# Patient Record
Sex: Female | Born: 1935 | Race: Black or African American | Hispanic: No | Smoking: Former smoker
Health system: Southern US, Community
[De-identification: ages and names within clinical notes are randomized; demographics above are authoritative.]

## PROBLEM LIST (undated history)

## (undated) DIAGNOSIS — E119 Type 2 diabetes mellitus without complications: Secondary | ICD-10-CM

## (undated) DIAGNOSIS — E1140 Type 2 diabetes mellitus with diabetic neuropathy, unspecified: Secondary | ICD-10-CM

## (undated) DIAGNOSIS — I4891 Unspecified atrial fibrillation: Secondary | ICD-10-CM

## (undated) DIAGNOSIS — E039 Hypothyroidism, unspecified: Secondary | ICD-10-CM

## (undated) DIAGNOSIS — I499 Cardiac arrhythmia, unspecified: Secondary | ICD-10-CM

## (undated) DIAGNOSIS — I10 Essential (primary) hypertension: Secondary | ICD-10-CM

## (undated) DIAGNOSIS — I639 Cerebral infarction, unspecified: Secondary | ICD-10-CM

## (undated) DIAGNOSIS — D649 Anemia, unspecified: Secondary | ICD-10-CM

## (undated) HISTORY — PX: SALPINGECTOMY: SHX328

## (undated) HISTORY — DX: Cerebral infarction, unspecified: I63.9

## (undated) HISTORY — PX: FRACTURE SURGERY: SHX138

## (undated) HISTORY — DX: Unspecified atrial fibrillation: I48.91

---

## 2004-05-30 ENCOUNTER — Ambulatory Visit: Payer: Self-pay | Admitting: Internal Medicine

## 2005-08-29 ENCOUNTER — Ambulatory Visit: Payer: Self-pay | Admitting: Internal Medicine

## 2006-09-01 ENCOUNTER — Ambulatory Visit: Payer: Self-pay | Admitting: Internal Medicine

## 2007-08-27 ENCOUNTER — Ambulatory Visit: Payer: Self-pay | Admitting: Gastroenterology

## 2007-09-03 ENCOUNTER — Ambulatory Visit: Payer: Self-pay | Admitting: Internal Medicine

## 2008-09-27 ENCOUNTER — Ambulatory Visit: Payer: Self-pay | Admitting: Internal Medicine

## 2009-09-28 ENCOUNTER — Ambulatory Visit: Payer: Self-pay | Admitting: Internal Medicine

## 2010-10-01 ENCOUNTER — Ambulatory Visit: Payer: Self-pay | Admitting: Internal Medicine

## 2011-10-22 ENCOUNTER — Ambulatory Visit: Payer: Self-pay | Admitting: Internal Medicine

## 2012-05-11 ENCOUNTER — Ambulatory Visit: Payer: Self-pay | Admitting: Internal Medicine

## 2012-07-03 ENCOUNTER — Ambulatory Visit: Payer: Self-pay | Admitting: Gastroenterology

## 2012-10-23 ENCOUNTER — Ambulatory Visit: Payer: Self-pay | Admitting: Internal Medicine

## 2013-12-07 ENCOUNTER — Ambulatory Visit: Payer: Self-pay | Admitting: Internal Medicine

## 2014-11-29 ENCOUNTER — Other Ambulatory Visit: Payer: Self-pay | Admitting: Internal Medicine

## 2014-11-29 DIAGNOSIS — Z1231 Encounter for screening mammogram for malignant neoplasm of breast: Secondary | ICD-10-CM

## 2014-12-09 ENCOUNTER — Ambulatory Visit
Admission: RE | Admit: 2014-12-09 | Discharge: 2014-12-09 | Disposition: A | Discharge status: Home / Self Care | Payer: Medicare Other | Source: Ambulatory Visit | Attending: Internal Medicine | Admitting: Internal Medicine

## 2014-12-09 ENCOUNTER — Other Ambulatory Visit: Payer: Self-pay | Admitting: Internal Medicine

## 2014-12-09 DIAGNOSIS — Z1231 Encounter for screening mammogram for malignant neoplasm of breast: Secondary | ICD-10-CM | POA: Diagnosis not present

## 2015-12-05 ENCOUNTER — Other Ambulatory Visit: Payer: Self-pay | Admitting: Internal Medicine

## 2015-12-05 DIAGNOSIS — Z1231 Encounter for screening mammogram for malignant neoplasm of breast: Secondary | ICD-10-CM

## 2015-12-21 ENCOUNTER — Ambulatory Visit
Admission: RE | Admit: 2015-12-21 | Discharge: 2015-12-21 | Disposition: A | Discharge status: Home / Self Care | Payer: Medicare HMO | Source: Ambulatory Visit | Attending: Internal Medicine | Admitting: Internal Medicine

## 2015-12-21 ENCOUNTER — Other Ambulatory Visit: Payer: Self-pay | Admitting: Internal Medicine

## 2015-12-21 DIAGNOSIS — Z1231 Encounter for screening mammogram for malignant neoplasm of breast: Secondary | ICD-10-CM

## 2016-06-28 ENCOUNTER — Encounter: Payer: Self-pay | Admitting: *Deleted

## 2016-06-28 ENCOUNTER — Ambulatory Visit: Payer: Medicare HMO | Admitting: Anesthesiology

## 2016-06-28 ENCOUNTER — Ambulatory Visit
Admission: RE | Admit: 2016-06-28 | Discharge: 2016-06-28 | Disposition: A | Discharge status: Home / Self Care | Payer: Medicare HMO | Source: Ambulatory Visit | Attending: Orthopedic Surgery | Admitting: Orthopedic Surgery

## 2016-06-28 ENCOUNTER — Ambulatory Visit: Payer: Medicare HMO

## 2016-06-28 ENCOUNTER — Encounter
Admission: RE | Disposition: A | Discharge status: Home / Self Care | Payer: Self-pay | Source: Ambulatory Visit | Attending: Orthopedic Surgery

## 2016-06-28 DIAGNOSIS — W002XXA Other fall from one level to another due to ice and snow, initial encounter: Secondary | ICD-10-CM | POA: Insufficient documentation

## 2016-06-28 DIAGNOSIS — E785 Hyperlipidemia, unspecified: Secondary | ICD-10-CM | POA: Diagnosis not present

## 2016-06-28 DIAGNOSIS — E1121 Type 2 diabetes mellitus with diabetic nephropathy: Secondary | ICD-10-CM | POA: Insufficient documentation

## 2016-06-28 DIAGNOSIS — D649 Anemia, unspecified: Secondary | ICD-10-CM | POA: Diagnosis not present

## 2016-06-28 DIAGNOSIS — E039 Hypothyroidism, unspecified: Secondary | ICD-10-CM | POA: Diagnosis not present

## 2016-06-28 DIAGNOSIS — S52571A Other intraarticular fracture of lower end of right radius, initial encounter for closed fracture: Secondary | ICD-10-CM | POA: Insufficient documentation

## 2016-06-28 DIAGNOSIS — Z87891 Personal history of nicotine dependence: Secondary | ICD-10-CM | POA: Insufficient documentation

## 2016-06-28 DIAGNOSIS — I10 Essential (primary) hypertension: Secondary | ICD-10-CM | POA: Insufficient documentation

## 2016-06-28 DIAGNOSIS — Z8781 Personal history of (healed) traumatic fracture: Secondary | ICD-10-CM

## 2016-06-28 DIAGNOSIS — Z7982 Long term (current) use of aspirin: Secondary | ICD-10-CM | POA: Diagnosis not present

## 2016-06-28 DIAGNOSIS — Z9889 Other specified postprocedural states: Secondary | ICD-10-CM

## 2016-06-28 HISTORY — DX: Essential (primary) hypertension: I10

## 2016-06-28 HISTORY — DX: Hypothyroidism, unspecified: E03.9

## 2016-06-28 HISTORY — DX: Type 2 diabetes mellitus without complications: E11.9

## 2016-06-28 HISTORY — DX: Anemia, unspecified: D64.9

## 2016-06-28 HISTORY — DX: Type 2 diabetes mellitus with diabetic neuropathy, unspecified: E11.40

## 2016-06-28 HISTORY — PX: ORIF WRIST FRACTURE: SHX2133

## 2016-06-28 LAB — GLUCOSE, CAPILLARY: Glucose-Capillary: 116 mg/dL — ABNORMAL HIGH (ref 65–99)

## 2016-06-28 SURGERY — OPEN REDUCTION INTERNAL FIXATION (ORIF) WRIST FRACTURE
Anesthesia: General | Laterality: Right

## 2016-06-28 MED ORDER — EPHEDRINE SULFATE 50 MG/ML IJ SOLN
INTRAMUSCULAR | Status: DC | PRN
  Administered 2016-06-28: 5 mg via INTRAVENOUS

## 2016-06-28 MED ORDER — HYDROCODONE-ACETAMINOPHEN 5-325 MG PO TABS: 1.0000 | ORAL_TABLET | Freq: Four times a day (QID) | ORAL | 0 refills | Status: DC | PRN

## 2016-06-28 MED ORDER — PROPOFOL 10 MG/ML IV BOLUS
INTRAVENOUS | Status: AC
  Filled 2016-06-28: qty 20

## 2016-06-28 MED ORDER — FENTANYL CITRATE (PF) 100 MCG/2ML IJ SOLN
25.0000 ug | INTRAMUSCULAR | Status: DC | PRN
  Administered 2016-06-28 (×3): 25 ug via INTRAVENOUS

## 2016-06-28 MED ORDER — CEFAZOLIN SODIUM-DEXTROSE 2-4 GM/100ML-% IV SOLN
INTRAVENOUS | Status: AC
  Filled 2016-06-28: qty 100

## 2016-06-28 MED ORDER — LIDOCAINE HCL (CARDIAC) 20 MG/ML IV SOLN
INTRAVENOUS | Status: DC | PRN
  Administered 2016-06-28: 80 mg via INTRAVENOUS

## 2016-06-28 MED ORDER — SODIUM CHLORIDE 0.9 % IV SOLN
INTRAVENOUS | Status: DC
  Administered 2016-06-28: 100 mL/h via INTRAVENOUS

## 2016-06-28 MED ORDER — LACTATED RINGERS IV SOLN
INTRAVENOUS | Status: DC | PRN
  Administered 2016-06-28: 13:00:00 via INTRAVENOUS

## 2016-06-28 MED ORDER — FENTANYL CITRATE (PF) 100 MCG/2ML IJ SOLN
INTRAMUSCULAR | Status: AC
  Filled 2016-06-28: qty 2

## 2016-06-28 MED ORDER — FAMOTIDINE 20 MG PO TABS
ORAL_TABLET | ORAL | Status: AC
  Administered 2016-06-28: 20 mg via ORAL
  Filled 2016-06-28: qty 1

## 2016-06-28 MED ORDER — CEFAZOLIN SODIUM-DEXTROSE 2-3 GM-% IV SOLR
INTRAVENOUS | Status: DC | PRN
  Administered 2016-06-28: 2 g via INTRAVENOUS

## 2016-06-28 MED ORDER — FAMOTIDINE 20 MG PO TABS
20.0000 mg | ORAL_TABLET | Freq: Once | ORAL | Status: AC
  Administered 2016-06-28: 20 mg via ORAL

## 2016-06-28 MED ORDER — GLYCOPYRROLATE 0.2 MG/ML IJ SOLN
INTRAMUSCULAR | Status: DC | PRN
  Administered 2016-06-28: 0.2 mg via INTRAVENOUS

## 2016-06-28 MED ORDER — PROPOFOL 10 MG/ML IV BOLUS
INTRAVENOUS | Status: DC | PRN
  Administered 2016-06-28: 80 mg via INTRAVENOUS
  Administered 2016-06-28: 30 mg via INTRAVENOUS

## 2016-06-28 MED ORDER — NEOMYCIN-POLYMYXIN B GU 40-200000 IR SOLN
Status: AC
  Filled 2016-06-28: qty 2

## 2016-06-28 MED ORDER — DEXAMETHASONE SODIUM PHOSPHATE 10 MG/ML IJ SOLN
INTRAMUSCULAR | Status: DC | PRN
  Administered 2016-06-28: 4 mg via INTRAVENOUS

## 2016-06-28 MED ORDER — FENTANYL CITRATE (PF) 100 MCG/2ML IJ SOLN
INTRAMUSCULAR | Status: DC | PRN
  Administered 2016-06-28 (×4): 25 ug via INTRAVENOUS

## 2016-06-28 MED ORDER — CEFAZOLIN SODIUM-DEXTROSE 2-4 GM/100ML-% IV SOLN: 2.0000 g | Freq: Once | INTRAVENOUS | Status: DC

## 2016-06-28 MED ORDER — ONDANSETRON HCL 4 MG/2ML IJ SOLN: 4.0000 mg | Freq: Once | INTRAMUSCULAR | Status: DC | PRN

## 2016-06-28 SURGICAL SUPPLY — 45 items
BANDAGE ACE 4X5 VEL STRL LF (GAUZE/BANDAGES/DRESSINGS) ×3 IMPLANT
BANDAGE ELASTIC 4 LF NS (GAUZE/BANDAGES/DRESSINGS) ×3 IMPLANT
BIT DRILL 2 FAST STEP (BIT) ×3 IMPLANT
BIT DRILL 2.5X4 QC (BIT) ×3 IMPLANT
BNDG ESMARK 4X12 TAN STRL LF (GAUZE/BANDAGES/DRESSINGS) ×3 IMPLANT
CANISTER SUCT 1200ML W/VALVE (MISCELLANEOUS) ×3 IMPLANT
CHLORAPREP W/TINT 26ML (MISCELLANEOUS) ×3 IMPLANT
CUFF TOURN 18 STER (MISCELLANEOUS) IMPLANT
DRAPE FLUOR MINI C-ARM 54X84 (DRAPES) ×3 IMPLANT
ELECT CAUTERY BLADE 6.4 (BLADE) ×3 IMPLANT
ELECT REM PT RETURN 9FT ADLT (ELECTROSURGICAL) ×3
ELECTRODE REM PT RTRN 9FT ADLT (ELECTROSURGICAL) ×1 IMPLANT
GAUZE PETRO XEROFOAM 1X8 (MISCELLANEOUS) ×3 IMPLANT
GAUZE SPONGE 4X4 12PLY STRL (GAUZE/BANDAGES/DRESSINGS) ×3 IMPLANT
GLOVE BIOGEL PI IND STRL 9 (GLOVE) ×1 IMPLANT
GLOVE BIOGEL PI INDICATOR 9 (GLOVE) ×2
GLOVE SURG SYN 9.0  PF PI (GLOVE) ×2
GLOVE SURG SYN 9.0 PF PI (GLOVE) ×1 IMPLANT
GOWN SRG 2XL LVL 4 RGLN SLV (GOWNS) ×1 IMPLANT
GOWN STRL NON-REIN 2XL LVL4 (GOWNS) ×2
GOWN STRL REUS W/ TWL LRG LVL3 (GOWN DISPOSABLE) ×1 IMPLANT
GOWN STRL REUS W/TWL LRG LVL3 (GOWN DISPOSABLE) ×2
K-WIRE 1.6 (WIRE) ×2
K-WIRE FX5X1.6XNS BN SS (WIRE) ×1
KIT RM TURNOVER STRD PROC AR (KITS) ×3 IMPLANT
KWIRE FX5X1.6XNS BN SS (WIRE) ×1 IMPLANT
NEEDLE FILTER BLUNT 18X 1/2SAF (NEEDLE) ×2
NEEDLE FILTER BLUNT 18X1 1/2 (NEEDLE) ×1 IMPLANT
NS IRRIG 500ML POUR BTL (IV SOLUTION) ×3 IMPLANT
PACK EXTREMITY ARMC (MISCELLANEOUS) ×3 IMPLANT
PAD CAST CTTN 4X4 STRL (SOFTGOODS) ×1 IMPLANT
PADDING CAST COTTON 4X4 STRL (SOFTGOODS) ×2
PEG SUBCHONDRAL SMOOTH 2.0X14 (Peg) ×3 IMPLANT
PEG SUBCHONDRAL SMOOTH 2.0X18 (Peg) ×3 IMPLANT
PEG SUBCHONDRAL SMOOTH 2.0X22 (Peg) ×9 IMPLANT
PEG SUBCHONDRAL SMOOTH 2.0X24 (Peg) ×3 IMPLANT
PLATE SHORT 21.6X48.9 NRRW RT (Plate) ×3 IMPLANT
SCREW CORT 3.5X10 LNG (Screw) ×9 IMPLANT
SPLINT CAST 1 STEP 3X12 (MISCELLANEOUS) ×3 IMPLANT
STOCKINETTE STRL 4IN 9604848 (GAUZE/BANDAGES/DRESSINGS) ×3 IMPLANT
SUT ETHILON 4-0 (SUTURE) ×2
SUT ETHILON 4-0 FS2 18XMFL BLK (SUTURE) ×1
SUT VICRYL 3-0 27IN (SUTURE) ×3 IMPLANT
SUTURE ETHLN 4-0 FS2 18XMF BLK (SUTURE) ×1 IMPLANT
SYR 3ML LL SCALE MARK (SYRINGE) ×3 IMPLANT

## 2016-06-28 NOTE — Anesthesia Preprocedure Evaluation (Addendum)
Anesthesia Evaluation  Patient identified by MRN, date of birth, ID band Patient awake    Reviewed: Allergy & Precautions, NPO status , Patient's Chart, lab work & pertinent test results, reviewed documented beta blocker date and time   Airway Mallampati: II  TM Distance: >3 FB     Dental  (+) Chipped, Partial Lower, Partial Upper   Pulmonary former smoker,           Cardiovascular hypertension, Pt. on medications      Neuro/Psych    GI/Hepatic   Endo/Other  diabetes, Type 2Hypothyroidism   Renal/GU      Musculoskeletal   Abdominal   Peds  Hematology  (+) anemia ,   Anesthesia Other Findings   Reproductive/Obstetrics                            Anesthesia Physical Anesthesia Plan  ASA: III  Anesthesia Plan: General   Post-op Pain Management:    Induction: Intravenous  Airway Management Planned: LMA  Additional Equipment:   Intra-op Plan:   Post-operative Plan:   Informed Consent: I have reviewed the patients History and Physical, chart, labs and discussed the procedure including the risks, benefits and alternatives for the proposed anesthesia with the patient or authorized representative who has indicated his/her understanding and acceptance.     Plan Discussed with: CRNA  Anesthesia Plan Comments:         Anesthesia Quick Evaluation

## 2016-06-28 NOTE — H&P (Signed)
Reviewed paper H+P, will be scanned into chart. No changes noted.  

## 2016-06-28 NOTE — Discharge Instructions (Addendum)
Keep arm elevated. Okay to ice tonight and tomorrow. Work fingers is much as possible. Pain medicine as directed.    AMBULATORY SURGERY  DISCHARGE INSTRUCTIONS   1) The drugs that you were given will stay in your system until tomorrow so for the next 24 hours you should not:  A) Drive an automobile B) Make any legal decisions C) Drink any alcoholic beverage   2) You may resume regular meals tomorrow.  Today it is better to start with liquids and gradually work up to solid foods.  You may eat anything you prefer, but it is better to start with liquids, then soup and crackers, and gradually work up to solid foods.   3) Please notify your doctor immediately if you have any unusual bleeding, trouble breathing, redness and pain at the surgery site, drainage, fever, or pain not relieved by medication.    4) Additional Instructions:        Please contact your physician with any problems or Same Day Surgery at [phone], Monday through Friday 6 am to 4 pm, or [place] at [facility] Main number at [phone].

## 2016-06-28 NOTE — Op Note (Signed)
06/28/2016  2:10 PM  PATIENT:  Barbara Butler  82 y.o. female  PRE-OPERATIVE DIAGNOSIS:  other closed intraarticular fracture of distal end of right distal radius  POST-OPERATIVE DIAGNOSIS:  other closed intraarticular fracture of distal end of right distal radius  PROCEDURE:  Procedure(s): OPEN REDUCTION INTERNAL FIXATION (ORIF) WRIST FRACTURE (Right) 3 distal fragments  SURGEON: Leitha SchullerMichael J , MD  ASSISTANTS: None  ANESTHESIA:   general  EBL:  No intake/output data recorded.  BLOOD ADMINISTERED:none  DRAINS: none   LOCAL MEDICATIONS USED:  NONE  SPECIMEN:  No Specimen  DISPOSITION OF SPECIMEN:  N/A  COUNTS:  YES  TOURNIQUET:   19 minutes at 250 mms mercury  IMPLANTS: Short narrow DVR plate with multiple smooth pegs and screws  DICTATION: .Dragon Dictation patient brought the operating room and after adequate anesthesia was obtained the right arm was prepped and draped in sterile fashion. After appropriate patient identification and timeout procedures were completed, fingertrap traction was applied and 10 pounds of traction applied off the end of the table. Tourniquet was then raised and the volar approach is made centered over the FCR tendon. Tendon sheath was incised and the tendon retracted radially to protect the radial artery and vessels. The pronator was then exposed and elevated off the distal radius and shaft to provide exposure of the fracture site. The fracture had the intra-articular component identified with some comminution noted. Further was used to elevate this and to get more anatomic alignment. With fluoroscopy evaluation partial closure reduction be obtained but the to the could I get acceptable alignment solely with closure reduction so a distal first technique was utilized placing all the smooth pegs into the distal fragment and then using standard technique drilling measuring and placing the smooth pegs after also peg holes were filled the plate was then  fixed to the shaft with 3 cortical screws this gave anatomic alignment of all fragments with restoration of radial inclination length and some volar tilt. Care was taken with fluoroscopy to be certain that there is no penetration into the joint the bone and pegs were up into the subchondral bone but not in the joint. For the traction was removed and there was no motion at the fracture site with passive range of motion. Tourniquet was let down and the wound thoroughly irrigated. Wound was closed with 3-0 Vicryl and 4-0 nylon. Xeroform 4 x 4's web roll volar splint and Ace wrap applied   PLAN OF CARE: Discharge to home after PACU  PATIENT DISPOSITION:  PACU - hemodynamically stable.

## 2016-06-28 NOTE — Anesthesia Postprocedure Evaluation (Signed)
Anesthesia Post Note  Patient: Barbara Butler  Procedure(s) Performed: Procedure(s) (LRB): OPEN REDUCTION INTERNAL FIXATION (ORIF) WRIST FRACTURE (Right)  Patient location during evaluation: PACU Anesthesia Type: General Level of consciousness: awake and alert Pain management: pain level controlled Vital Signs Assessment: post-procedure vital signs reviewed and stable Respiratory status: spontaneous breathing, nonlabored ventilation, respiratory function stable and patient connected to nasal cannula oxygen Cardiovascular status: blood pressure returned to baseline and stable Postop Assessment: no signs of nausea or vomiting Anesthetic complications: no     Last Vitals:  Vitals:   06/28/16 1508 06/28/16 1520  BP: 136/69 134/69  Pulse: (!) 44 68  Resp: 15 16  Temp:  36.6 C    Last Pain:  Vitals:   06/28/16 1520  TempSrc: Temporal  PainSc:                  , S

## 2016-06-28 NOTE — Transfer of Care (Signed)
Immediate Anesthesia Transfer of Care Note  Patient: Royann ShiversEthel A Eisenhart  Procedure(s) Performed: Procedure(s): OPEN REDUCTION INTERNAL FIXATION (ORIF) WRIST FRACTURE (Right)  Patient Location: PACU  Anesthesia Type:General  Level of Consciousness: awake, alert  and oriented  Airway & Oxygen Therapy: Patient Spontanous Breathing and Patient connected to nasal cannula oxygen  Post-op Assessment: Report given to RN and Post -op Vital signs reviewed and stable  Post vital signs: Reviewed and stable  Last Vitals:  Vitals:   06/28/16 1155  BP: (!) 153/83  Pulse: 82  Resp: 16  Temp: 36.7 C    Last Pain:  Vitals:   06/28/16 1155  TempSrc: Oral      Patients Stated Pain Goal: 0 (06/28/16 1155)  Complications: No apparent anesthesia complications

## 2016-06-28 NOTE — Anesthesia Procedure Notes (Signed)
Procedure Name: LMA Insertion Date/Time: 06/28/2016 1:03 PM Performed by: Evelena PeatFERRERO-CONOVER,  Pre-anesthesia Checklist: Patient identified, Emergency Drugs available, Suction available and Patient being monitored Patient Re-evaluated:Patient Re-evaluated prior to inductionOxygen Delivery Method: Circle system utilized Preoxygenation: Pre-oxygenation with 100% oxygen Intubation Type: IV induction Ventilation: Mask ventilation without difficulty LMA: LMA inserted LMA Size: 3.0 Number of attempts: 1

## 2016-06-29 ENCOUNTER — Encounter: Payer: Self-pay | Admitting: Orthopedic Surgery

## 2016-07-04 NOTE — H&P (Signed)
Chief Complaint Chief Complaint  Patient presents with  . Follow-up  Right wrist facture DOI: 06/16/16   Reason for Visit Barbara Butler is a 82 y.o. who present secondary to pain to the right wrist. Patient presents by referral Dr. Deloria Lair. On 06/17/2016 patient slipped on some "black ice "causing her to fall on outstretched right hand. Patient is right-hand dominant. Denies any numbness, tingling or burning sensation. Was seen in the walk-in clinic which x-rays revealed a fracture distal radius. These films were reviewed with me on the day of her visit at that time. She was fitted with a sugar tong splint. Doing well other than having mild swelling of the fingers. She presents today 10 days status post injury for repeat x-rays of the right wrist, the patient denies any pain in the right wrist at today's visit, 0 out of 10 pain. Patient denies any personal history of heart attack, stroke, asthma or COPD.  Past Medical History Past Medical History:  Diagnosis Date  . Diabetes mellitus type 2, uncomplicated (CMS-HCC)  . 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 Hyperlipidemia, unspecified  . Hypertension  . Hypothyroidism, unspecified  . IDA (iron deficiency anemia)   Past Surgical History Past Surgical History:  Procedure Laterality Date  . Tubal pregnancy   Past Family History Family History  Problem Relation Age of Onset  . High blood pressure (Hypertension) Mother  . Diabetes Mother  . Colon polyps Sister  . Stroke Sister  . High blood pressure (Hypertension) Sister   Medications Current Outpatient Prescriptions Ordered in Epic  Medication Sig Dispense Refill  . amLODIPine (NORVASC) 5 MG tablet Take 1 tablet (5 mg total) by mouth once daily. 90 tablet 3  . ascorbic acid, vitamin C, (VITAMIN C) 500 MG tablet Take 500 mg by mouth once daily.  Marland Kitchen aspirin 81 MG EC tablet Take 81 mg by mouth 2 (two) times daily.  . blood glucose diagnostic test  strip Use once daily. Use as instructed. DX E11.9 ONE TOUCH 100 each 5  . blood glucose meter kit Use as directed (DX E11.9 ONE TOUCH). 1 each 0  . cholecalciferol (VITAMIN D3) 2,000 unit tablet Take 2,000 Units by mouth once daily.  . fish,bora,flax oils-om3,6,9 #1 (OMEGA 3-6-9) 1,200 mg Cap Take 1 capsule by mouth 2 (two) times daily.  Marland Kitchen FOLIC ACID/MULTIVIT-MIN/LUTEIN (CENTRUM SILVER ORAL) Take 1 tablet by mouth once daily.  . hydrALAZINE (APRESOLINE) 50 MG tablet Take 1 tablet (50 mg total) by mouth 3 (three) times daily. 270 tablet 3  . KRILL/OM-3/DHA/EPA/PHOSPHO/AST (MEGARED OMEGA-3 KRILL OIL ORAL) Take 750 mg by mouth once daily.  Marland Kitchen lancets Use 1 each once daily. Use as instructed. DX E11.9 100 each 5  . levothyroxine (SYNTHROID, LEVOTHROID) 88 MCG tablet Take 1 tablet (88 mcg total) by mouth once daily. Take on an empty stomach with a glass of water at least 30-60 minutes before breakfast. 90 tablet 3  . olmesartan-hydrochlorothiazide (BENICAR HCT) 40-25 mg tablet Take 1 tablet by mouth once daily. 90 tablet 3   No current Epic-ordered facility-administered medications on file.   Allergies No Known Allergies   Review of Systems A comprehensive 14 point ROS was performed, reviewed, and the pertinent orthopaedic findings are documented in the HPI.  Exam BP (!) 140/90  Ht 157.5 cm (5' 2")  Wt 68.5 kg (151 lb)  BMI 27.62 kg/m  General/Constitutional: The patient appears to be well-nourished, well-developed, and in no acute distress. Neuro/Psych: Normal mood and  affect, oriented to person, place and time. Eyes: Non-icteric. Pupils are equal, round, and reactive to light, and exhibit synchronous movement. ENT: Unremarkable. Lymphatic: No palpable adenopathy. Respiratory: Lungs clear to auscultation, Normal chest excursion, No wheezes and Non-labored breathing Cardiovascular: Regular rate and rhythm. No murmurs. and No edema, swelling or tenderness, except as noted in detailed  exam. Integumentary: No impressive skin lesions present, except as noted in detailed exam. Musculoskeletal: Unremarkable, except as noted in detailed exam.  Patient is noted to have mild swelling to the right hand. She has good range of motion of the fingers without any discomfort. Capillary refill appeared to be intact and within normal limits. Sensation light touch intact and within normal limits.  X-ray AP, lateral and oblique images of the right wrist were obtained today in the office and reviewed by me. These x-rays demonstrate continued visualization of the right distal radius comminuted fracture however on today's visit there appears to be increased dorsal displacement and angulation. Minimal loss of radial height. No other acute fracture or lytic lesion is identified.  Impression  1. Comminuted displaced fracture distal right radius  Plan   1. Treatment options were discussed today with the patient. 2. The patient was informed that the x-rays demonstrate that the fracture has shifted, she was instructed on the positives and negatives of both surgical intervention and nonsurgical intervention. The patient has elected to undergo a right distal radius ORIF with Dr. Rudene Christians. 3. Positives and negatives of the surgery were discussed, this document will serve as a surgical H&P. 4. The patient will follow-up per standard postop protocol. She can call the clinic if she has any questions, new symptoms develop or symptoms worsen.  The procedure was discussed with the patient, as were the potential risks (including bleeding, infection, nerve and/or blood vessel injury, persistent or recurrent pain, failure of the repair, progression of arthritis, need for further surgery, blood clots, strokes, heart attacks and/or arhythmias, pneumonia, etc.) and benefits. The patient states her understanding and wishes to proceed.  This note was generated in part with voice recognition software and I apologize for  any typographical errors that were not detected and corrected  [place], PA-C [place]

## 2016-07-22 ENCOUNTER — Ambulatory Visit: Payer: Medicare HMO | Attending: Orthopedic Surgery | Admitting: Occupational Therapy

## 2016-07-22 DIAGNOSIS — L905 Scar conditions and fibrosis of skin: Secondary | ICD-10-CM | POA: Diagnosis present

## 2016-07-22 DIAGNOSIS — M25641 Stiffness of right hand, not elsewhere classified: Secondary | ICD-10-CM | POA: Diagnosis present

## 2016-07-22 DIAGNOSIS — M25531 Pain in right wrist: Secondary | ICD-10-CM

## 2016-07-22 DIAGNOSIS — M25631 Stiffness of right wrist, not elsewhere classified: Secondary | ICD-10-CM | POA: Insufficient documentation

## 2016-07-22 DIAGNOSIS — M6281 Muscle weakness (generalized): Secondary | ICD-10-CM

## 2016-07-22 NOTE — Patient Instructions (Signed)
Contrast Tendon glides  Opposition   Wrist AROM in all planes  And flexion composite  10 reps  2-3 x day

## 2016-07-22 NOTE — Therapy (Signed)
[facility] [address], [place], [address] Phone: [phone]   Fax:  [phone]  Occupational Therapy Treatment  Patient Details  Name: Barbara Butler MRN: [id number] Date of Birth: [birth year] Referring Provider: Rosita Kea  Encounter Date: 07/22/2016      OT End of Session - 07/22/16 1354    Visit Number 1   Number of Visits 12   Date for OT Re-Evaluation 09/02/16   OT Start Time 1300   OT Stop Time 1352   OT Time Calculation (min) 52 min   Activity Tolerance Patient tolerated treatment well   Behavior During Therapy [facility] for tasks assessed/performed      Past Medical History:  Diagnosis Date  . Anemia   . Diabetes mellitus without complication (HCC)    diet controlled  . Diabetic neuropathy (HCC)   . Hypertension   . Hypothyroidism     Past Surgical History:  Procedure Laterality Date  . ORIF WRIST FRACTURE Right 06/28/2016   Procedure: OPEN REDUCTION INTERNAL FIXATION (ORIF) WRIST FRACTURE;  Surgeon: Kennedy Bucker, MD;  Location: [facility] ORS;  Service: Orthopedics;  Laterality: Right;  . SALPINGECTOMY      There were no vitals filed for this visit.      Subjective Assessment - 07/22/16 1306    Subjective  Fell on 10th Dec - surgery on 22nd - sutures come out on 5th Jan - then in velcro splint - most all the time -    Patient Stated Goals Want to get the use of my R hand back - cannot use it - writing , typing dressing , bathing , eating ect    Currently in Pain? No/denies            [facility] OT Assessment - 07/22/16 0001      Assessment   Diagnosis R distal radius ORIF   Referring Provider Rosita Kea   Onset Date 06/28/16     Precautions   Required Braces or Orthoses Other Brace/Splint  wrist splint      Balance Screen   Has the patient fallen in the past 6 months Yes   How many times? 1   Has the patient had a decrease in activity level because of a fear of falling?  No   Is the  patient reluctant to leave their home because of a fear of falling?  No     Home  Environment   Lives With Alone     Prior Function   Vocation Retired   Leisure R hand dominant Therapist, nutritional at [employer] , read , own house work , yard work      AROM   Right Forearm Pronation 90 Degrees   Right Forearm Supination 50 Degrees   Left Forearm Pronation 90 Degrees   Left Forearm Supination 90 Degrees   Right Wrist Extension 25 Degrees   Right Wrist Flexion 30 Degrees   Right Wrist Radial Deviation 10 Degrees   Right Wrist Ulnar Deviation 5 Degrees   Left Wrist Extension 65 Degrees   Left Wrist Flexion 74 Degrees   Left Wrist Radial Deviation 30 Degrees   Left Wrist Ulnar Deviation 35 Degrees     Left Hand AROM   L Thumb Opposition to Index --  Opposition to base of 5th - some edema in thenar eminence   L Index  MCP 0-90 80 Degrees   L Index PIP 0-100 5 Degrees   L Long  MCP 0-90 80  Degrees   L Long PIP 0-100 85 Degrees   L Ring  MCP 0-90 75 Degrees   L Ring PIP 0-100 80 Degrees   L Little  MCP 0-90 65 Degrees   L Little PIP 0-100 75 Degrees        HEat done 10 min  Reviewed HEP with pt - see pt instruction                   OT Education - 07/22/16 1354    Education provided Yes   Education Details HEP and findings   Person(s) Educated Patient   Methods Explanation;Demonstration;Tactile cues;Verbal cues;Handout   Comprehension Verbal cues required;Returned demonstration;Verbalized understanding          OT Short Term Goals - 07/22/16 1359      OT SHORT TERM GOAL #1   Title Pt fisting of R hand improve to be able to touch palm and hold objects like utencils and brush   Baseline MC's 65-80 , PIP 75-85   Time 2   Period Weeks   Status New     OT SHORT TERM GOAL #2   Title Pt to be ind in Scar healing and management to allow full flexion of digits and progress with wrist flexion and extention   Baseline attempted to remove sterri stips - not closed yet -  preventing full flexion of digits and wrist extention    Time 3   Period Weeks   Status New     OT SHORT TERM GOAL #3   Title Pain on PRWHE decrease by 10 points    Baseline Pain on PRWHE at eval 25/50   Time 3   Period Weeks   Status New           OT Long Term Goals - 07/22/16 1636      OT LONG TERM GOAL #1   Title Function on PRWHE decrease by at least 20 points    Baseline Function at eval 45.5/50   Time 5   Period Weeks   Status New     OT LONG TERM GOAL #2   Title Wrist AROM in all planes increase by 10 to 15 degrees to turn doorknob and use hand in more than 50% of ADL's    Baseline See flowsheet    Time 6   Period Weeks   Status New     OT LONG TERM GOAL #3   Title Grip and Prehension strenght in R hand increase to more than 50% compare to L hand    Baseline NT - pt between 3-4 wks s/p   Time 6   Period Weeks   Status New               Plan - 07/22/16 1355    Clinical Impression Statement Pt present about 3-4 wks s/p R distal radius ORIF - pt in velcro wrist splint - and present with decrease R digits flexion , wrist AROM in all planes , incision still not healed - replace 2 sterristrips again - after attempted to take if off -  pt is diabetic - decrease strenght and use of R dominant hand in ADL's and IADL"S    Rehab Potential Good   OT Frequency 2x / week   OT Duration 6 weeks   OT Treatment/Interventions Self-care/ADL training;Fluidtherapy;Splinting;Patient/family education;Therapeutic exercises;Contrast Bath;Parrafin;Manual Therapy;Passive range of motion;Scar mobilization   Plan assess healing of incition , progress with HEP    OT Home Exercise Plan  See pt instruction      Patient will benefit from skilled therapeutic intervention in order to improve the following deficits and impairments:  Decreased range of motion, Impaired flexibility, Increased edema, Decreased knowledge of precautions, Pain, Impaired UE functional use, Decreased scar  mobility, Decreased strength  Visit Diagnosis: Pain in right wrist - Plan: Ot plan of care cert/re-cert  Stiffness of right wrist, not elsewhere classified - Plan: Ot plan of care cert/re-cert  Stiffness of right hand, not elsewhere classified - Plan: Ot plan of care cert/re-cert  Muscle weakness (generalized) - Plan: Ot plan of care cert/re-cert  Scar condition and fibrosis of skin - Plan: Ot plan of care cert/re-cert    Problem List There are no active problems to display for this patient.   Oletta Cohn OTR/L,CLT 07/22/2016, 4:42 PM  [facility] PH[facility] SPORTS MEDICINE [address], [place], [address] Phone: [phone]   Fax:  [phone]  Name: Barbara Butler MRN: [id number] Date of Birth: 1930/06/14

## 2016-07-25 ENCOUNTER — Ambulatory Visit: Payer: Medicare HMO | Admitting: Occupational Therapy

## 2016-07-29 ENCOUNTER — Ambulatory Visit: Payer: Medicare HMO | Admitting: Occupational Therapy

## 2016-07-29 DIAGNOSIS — M25531 Pain in right wrist: Secondary | ICD-10-CM

## 2016-07-29 DIAGNOSIS — L905 Scar conditions and fibrosis of skin: Secondary | ICD-10-CM

## 2016-07-29 DIAGNOSIS — M25641 Stiffness of right hand, not elsewhere classified: Secondary | ICD-10-CM

## 2016-07-29 DIAGNOSIS — M6281 Muscle weakness (generalized): Secondary | ICD-10-CM

## 2016-07-29 DIAGNOSIS — M25631 Stiffness of right wrist, not elsewhere classified: Secondary | ICD-10-CM

## 2016-07-29 NOTE — Patient Instructions (Addendum)
Same HEP -  To do contrast correctly  pt to do UD and RD over armrest  And L hand on forearm during above and ext  10 reps  Tendon glides - to do sequence - did not do intrinsic fist

## 2016-07-29 NOTE — Therapy (Signed)
[facility] [address], [place], [address] Phone: [phone]   Fax:  [phone]  Occupational Therapy Treatment  Patient Details  Name: Barbara Butler MRN: [id number] Date of Birth: 07-28-1929 Referring Provider: Rosita Kea  Encounter Date: 07/29/2016      OT End of Session - 07/29/16 1315    Visit Number 2   Number of Visits 12   Date for OT Re-Evaluation 09/02/16   OT Start Time 1255   OT Stop Time 1337   OT Time Calculation (min) 42 min   Activity Tolerance Patient tolerated treatment well   Behavior During Therapy [facility] for tasks assessed/performed      Past Medical History:  Diagnosis Date  . Anemia   . Diabetes mellitus without complication (HCC)    diet controlled  . Diabetic neuropathy (HCC)   . Hypertension   . Hypothyroidism     Past Surgical History:  Procedure Laterality Date  . ORIF WRIST FRACTURE Right 06/28/2016   Procedure: OPEN REDUCTION INTERNAL FIXATION (ORIF) WRIST FRACTURE;  Surgeon: Kennedy Bucker, MD;  Location: [facility] ORS;  Service: Orthopedics;  Laterality: Right;  . SALPINGECTOMY      There were no vitals filed for this visit.      Subjective Assessment - 07/29/16 1258    Subjective  LIttle less pain - exercises still doing okay - sterristrips come off - in the am very stiff - cannot make fist yet    Patient Stated Goals Want to get the use of my R hand back - cannot use it - writing , typing dressing , bathing , eating ect    Currently in Pain? No/denies            [facility] OT Assessment - 07/29/16 0001      AROM   Right Wrist Flexion 44 Degrees   Right Wrist Radial Deviation 12 Degrees   Right Wrist Ulnar Deviation 14 Degrees                  OT Treatments/Exercises (OP) - 07/29/16 0001      RUE Contrast Bath   Time 11 minutes   Comments At [facility] to increase wrist ROM , decrease pain and edema       Measurements taken for wrist AROM  See flow  sheet  Scar healing assess - pt diabetic - pt to hold off on scar mobs until next visit  Soft tissue massage to forearm - done distal and proximal to decrease edema  Tendon glides AROM - pt was not doing intrinsic -  composite flexion PROM each digit Place and hold PROM to palm  10 reps each   Wrist AROM in all planes - pt to change UD and RD over armrest And L hand on forearm to increase wrist ext, RD and UD  Pronation and supination with elbow to side  10 reps             OT Education - 07/29/16 1315    Education provided Yes   Education Details HEP review    Person(s) Educated Patient   Methods Explanation;Demonstration;Tactile cues;Verbal cues   Comprehension Verbal cues required;Returned demonstration;Verbalized understanding          OT Short Term Goals - 07/22/16 1359      OT SHORT TERM GOAL #1   Title Pt fisting of R hand improve to be able to touch palm and hold objects like utencils and brush  Baseline MC's 65-80 , PIP 75-85   Time 2   Period Weeks   Status New     OT SHORT TERM GOAL #2   Title Pt to be ind in Scar healing and management to allow full flexion of digits and progress with wrist flexion and extention   Baseline attempted to remove sterri stips - not closed yet - preventing full flexion of digits and wrist extention    Time 3   Period Weeks   Status New     OT SHORT TERM GOAL #3   Title Pain on PRWHE decrease by 10 points    Baseline Pain on PRWHE at eval 25/50   Time 3   Period Weeks   Status New           OT Long Term Goals - 07/22/16 1636      OT LONG TERM GOAL #1   Title Function on PRWHE decrease by at least 20 points    Baseline Function at eval 45.5/50   Time 5   Period Weeks   Status New     OT LONG TERM GOAL #2   Title Wrist AROM in all planes increase by 10 to 15 degrees to turn doorknob and use hand in more than 50% of ADL's    Baseline See flowsheet    Time 6   Period Weeks   Status New     OT LONG TERM  GOAL #3   Title Grip and Prehension strenght in R hand increase to more than 50% compare to L hand    Baseline NT - pt between 3-4 wks s/p   Time 6   Period Weeks   Status New               Plan - 07/29/16 1315    Clinical Impression Statement Pt is 4 wks s/p - pt did show increase AROM at wrist - except wrist extention - scar still adhere not ready for scar mobs this date- pt is diabetic - pt to see MD Wed - will start PROM next session - composite fist PROM WNL but AROM impaired to palm - ? scar adhesion    Rehab Potential Good   OT Frequency 2x / week   OT Duration 6 weeks   OT Treatment/Interventions Self-care/ADL training;Fluidtherapy;Splinting;Patient/family education;Therapeutic exercises;Contrast Bath;Parrafin;Manual Therapy;Passive range of motion;Scar mobilization   Plan results from MD visit   OT Home Exercise Plan See pt instruction   Consulted and Agree with Plan of Care Patient      Patient will benefit from skilled therapeutic intervention in order to improve the following deficits and impairments:  Decreased range of motion, Impaired flexibility, Increased edema, Decreased knowledge of precautions, Pain, Impaired UE functional use, Decreased scar mobility, Decreased strength  Visit Diagnosis: Pain in right wrist  Stiffness of right wrist, not elsewhere classified  Stiffness of right hand, not elsewhere classified  Muscle weakness (generalized)  Scar condition and fibrosis of skin    Problem List There are no active problems to display for this patient.   [name],  OTR/L,CLT 07/29/2016, 1:40 PM  [facility] [address] [place], [place], [address] Phone: [phone]   Fax:  [phone]  Name: Barbara Butler MRN: [id number] Date of Birth: 07/11/1929

## 2016-07-30 DIAGNOSIS — Z8781 Personal history of (healed) traumatic fracture: Secondary | ICD-10-CM | POA: Insufficient documentation

## 2016-08-01 ENCOUNTER — Ambulatory Visit: Payer: Medicare HMO | Admitting: Occupational Therapy

## 2016-08-01 DIAGNOSIS — M25641 Stiffness of right hand, not elsewhere classified: Secondary | ICD-10-CM

## 2016-08-01 DIAGNOSIS — M25531 Pain in right wrist: Secondary | ICD-10-CM | POA: Diagnosis not present

## 2016-08-01 DIAGNOSIS — M6281 Muscle weakness (generalized): Secondary | ICD-10-CM

## 2016-08-01 DIAGNOSIS — L905 Scar conditions and fibrosis of skin: Secondary | ICD-10-CM

## 2016-08-01 DIAGNOSIS — M25631 Stiffness of right wrist, not elsewhere classified: Secondary | ICD-10-CM

## 2016-08-01 NOTE — Therapy (Signed)
[facility] [address] [place], [place], [address] Phone: [phone]   Fax:  [phone]  Occupational Therapy Treatment  Patient Details  Name: Barbara Butler MRN: [id number] Date of Birth: 03/21/1930 Referring Provider: Rosita KeaMenz  Encounter Date: 08/01/2016      OT End of Session - 08/01/16 1448    Visit Number 4   Number of Visits 12   Date for OT Re-Evaluation 09/02/16   OT Start Time 1400   OT Stop Time 1445   OT Time Calculation (min) 45 min   Activity Tolerance Patient tolerated treatment well   Behavior [facility] for tasks assessed/performed      Past Medical History:  Diagnosis Date  . Anemia   . Diabetes mellitus without complication (HCC)    diet controlled  . Diabetic neuropathy (HCC)   . Hypertension   . Hypothyroidism     Past Surgical History:  Procedure Laterality Date  . ORIF WRIST FRACTURE Right 06/28/2016   Procedure: OPEN REDUCTION INTERNAL FIXATION (ORIF) WRIST FRACTURE;  Surgeon: Kennedy BuckerMichael Menz, MD;  Location: [facility] ORS;  Service: Orthopedics;  Laterality: Right;  . SALPINGECTOMY      There were no vitals filed for this visit.      Subjective Assessment - 08/01/16 1402    Subjective  Seen the DR yesterday - healing okay - can take splint off at home - follow up appt in 4 wks - pain better - just very weak   Patient Stated Goals Want to get the use of my R hand back - cannot use it - writing , typing dressing , bathing , eating ect    Currently in Pain? No/denies                      OT Treatments/Exercises (OP) - 08/01/16 0001      RUE Fluidotherapy   Number Minutes Fluidotherapy 10 Minutes   RUE Fluidotherapy Location Hand;Wrist   [facility] stiffness and increase ROM at wrist and digits       Fluidotherapy done  Scar massage done and pt ed on doing at home - hand out provided  cica scar pad provided   Add PROM for  wrist ext, flexion and RD, UD at edge of table  Reviewed and hand out provided 10 reps each t    Tendon glides AROM -   composite flexion PROM each digit Place and hold PROM to palm - not able to touch palm actively 10 reps each   Wrist AROM in all planes -  After PROM in all planes  10 reps   Fitted with Benik neoprene for home use - with act that cause pain or feels weak- outside of splint hard splint             OT Education - 08/01/16 1448    Education provided Yes   Education Details HEP updated    Person(s) Educated Patient   Methods Explanation;Demonstration;Tactile cues;Verbal cues;Handout   Comprehension Verbal cues required;Returned demonstration;Verbalized understanding          OT Short Term Goals - 07/22/16 1359      OT SHORT TERM GOAL #1   Title Pt fisting of R hand improve to be able to touch palm and hold objects like utencils and brush   Baseline MC's 65-80 , PIP 75-85   Time 2   Period Weeks   Status  New     OT SHORT TERM GOAL #2   Title Pt to be ind in Scar healing and management to allow full flexion of digits and progress with wrist flexion and extention   Baseline attempted to remove sterri stips - not closed yet - preventing full flexion of digits and wrist extention    Time 3   Period Weeks   Status New     OT SHORT TERM GOAL #3   Title Pain on PRWHE decrease by 10 points    Baseline Pain on PRWHE at eval 25/50   Time 3   Period Weeks   Status New           OT Long Term Goals - 07/22/16 1636      OT LONG TERM GOAL #1   Title Function on PRWHE decrease by at least 20 points    Baseline Function at eval 45.5/50   Time 5   Period Weeks   Status New     OT LONG TERM GOAL #2   Title Wrist AROM in all planes increase by 10 to 15 degrees to turn doorknob and use hand in more than 50% of ADL's    Baseline See flowsheet    Time 6   Period Weeks   Status New     OT LONG TERM GOAL #3   Title Grip and Prehension strenght in R  hand increase to more than 50% compare to L hand    Baseline NT - pt between 3-4 wks s/p   Time 6   Period Weeks   Status New               Plan - 08/01/16 1417    Clinical Impression Statement Pt 5 wks out from ORIF - pain improve greatly - and Flexion and extention of wrist limitd - scar adhere - but ready to do scar massage - add PROM for wrist , cica scar pad for night time - benik splint for use at home - hard one outside of house    Rehab Potential Good   OT Frequency 2x / week   OT Duration 4 weeks   OT Treatment/Interventions Self-care/ADL training;Fluidtherapy;Splinting;Patient/family education;Therapeutic exercises;Contrast Bath;Parrafin;Manual Therapy;Passive range of motion;Scar mobilization   Plan How did with PROM    OT Home Exercise Plan See pt instruction   Consulted and Agree with Plan of Care Patient      Patient will benefit from skilled therapeutic intervention in order to improve the following deficits and impairments:  Decreased range of motion, Impaired flexibility, Increased edema, Decreased knowledge of precautions, Pain, Impaired UE functional use, Decreased scar mobility, Decreased strength  Visit Diagnosis: Pain in right wrist  Stiffness of right wrist, not elsewhere classified  Stiffness of right hand, not elsewhere classified  Muscle weakness (generalized)  Scar condition and fibrosis of skin    Problem List There are no active problems to display for this patient.   Oletta Cohn OTR/L,CLT 08/01/2016, 2:51 PM  [facility] PHYSICAL AND SPORTS MEDICINE [address], [place], [address] Phone: [phone]   Fax:  [phone]  Name: Barbara Butler MRN: [id number] Date of Birth: [birth year]

## 2016-08-01 NOTE — Patient Instructions (Addendum)
  Scar massage done and pt ed on doing at home - hand out provided  cica scar pad provided   Add PROM for wrist ext, flexion and RD, UD at edge of table  Reviewed and hand out provided 10 reps each t    AROM for digits - and light blue putty for gripping 12 reps   Wrist AROM in all planes -  After PROM in all planes  10 reps

## 2016-08-06 ENCOUNTER — Ambulatory Visit: Payer: Medicare HMO | Admitting: Occupational Therapy

## 2016-08-08 ENCOUNTER — Ambulatory Visit: Payer: Medicare HMO | Attending: Orthopedic Surgery | Admitting: Occupational Therapy

## 2016-08-08 DIAGNOSIS — M6281 Muscle weakness (generalized): Secondary | ICD-10-CM | POA: Insufficient documentation

## 2016-08-08 DIAGNOSIS — M25641 Stiffness of right hand, not elsewhere classified: Secondary | ICD-10-CM | POA: Insufficient documentation

## 2016-08-08 DIAGNOSIS — M25531 Pain in right wrist: Secondary | ICD-10-CM | POA: Diagnosis present

## 2016-08-08 DIAGNOSIS — L905 Scar conditions and fibrosis of skin: Secondary | ICD-10-CM | POA: Diagnosis present

## 2016-08-08 DIAGNOSIS — M25631 Stiffness of right wrist, not elsewhere classified: Secondary | ICD-10-CM | POA: Insufficient documentation

## 2016-08-08 NOTE — Patient Instructions (Addendum)
PROM composite fist prior to tendonglides PROM for wrist over edge of table and PROM for supination  Followed by AROM for wrist

## 2016-08-08 NOTE — Therapy (Signed)
[facility] [address], [place], [address] Phone: [phone]   Fax:  [phone]  Occupational Therapy Treatment  Patient Details  Name: Barbara Butler MRN: [id number] Date of Birth: 1930/07/04 Referring Provider: Rosita Kea  Encounter Date: 08/08/2016      OT End of Session - 08/08/16 1021    Visit Number 5   Number of Visits 12   Date for OT Re-Evaluation 09/02/16   OT Start Time 1022   OT Stop Time 1101   OT Time Calculation (min) 39 min   Activity Tolerance Patient tolerated treatment well   Behavior During Therapy [facility] for tasks assessed/performed      Past Medical History:  Diagnosis Date  . Anemia   . Diabetes mellitus without complication (HCC)    diet controlled  . Diabetic neuropathy (HCC)   . Hypertension   . Hypothyroidism     Past Surgical History:  Procedure Laterality Date  . ORIF WRIST FRACTURE Right 06/28/2016   Procedure: OPEN REDUCTION INTERNAL FIXATION (ORIF) WRIST FRACTURE;  Surgeon: Kennedy Bucker, MD;  Location: [facility] ORS;  Service: Orthopedics;  Laterality: Right;  . SALPINGECTOMY      There were no vitals filed for this visit.      Subjective Assessment - 08/08/16 1024    Subjective  Wearing soft splint at home - but scar pad night time not sticking - scar is better -  using it more    Patient Stated Goals Want to get the use of my R hand back - cannot use it - writing , typing dressing , bathing , eating ect    Currently in Pain? No/denies            [facility] OT Assessment - 08/08/16 0001      AROM   Right Wrist Extension 30 Degrees   Right Wrist Flexion 44 Degrees                  OT Treatments/Exercises (OP) - 08/08/16 0001      RUE Fluidotherapy   Number Minutes Fluidotherapy 10 Minutes   RUE Fluidotherapy Location Hand;Wrist   Comments AROM for wrist in all planes and digits  at [facility] to increase ROM and decrease pain     Fluidotherapy done   Scar massage done - healing great this date and scar mobs done  and vibration  Pt to cont with it and provided new cica scar pad  PROM composite flexion of digits done - pt to do at home - able to touch palm this date actively  Tendon glides AROM  10 reps each Light blue putty for gripping - after PROM and AROM - 10 reps     PROM for wrist ext, flexion and RD, UD at edge of table - pt did not remember them - needed mod A  Reviewed and hand out provided again  Also add PROM for supination - and place and hold  10 reps each     Wrist AROM in all planes -  After PROM in all planes  10 reps    Benik neoprene for home use - pt to wear 50/50 on and off when at home -hard one outside of house            OT Education - 08/08/16 1021    Education provided Yes   Education Details HEP changes   Person(s) Educated Patient   Methods Explanation;Demonstration;Tactile cues;Verbal cues   Comprehension  Verbal cues required;Returned demonstration;Verbalized understanding          OT Short Term Goals - 07/22/16 1359      OT SHORT TERM GOAL #1   Title Pt fisting of R hand improve to be able to touch palm and hold objects like utencils and brush   Baseline MC's 65-80 , PIP 75-85   Time 2   Period Weeks   Status New     OT SHORT TERM GOAL #2   Title Pt to be ind in Scar healing and management to allow full flexion of digits and progress with wrist flexion and extention   Baseline attempted to remove sterri stips - not closed yet - preventing full flexion of digits and wrist extention    Time 3   Period Weeks   Status New     OT SHORT TERM GOAL #3   Title Pain on PRWHE decrease by 10 points    Baseline Pain on PRWHE at eval 25/50   Time 3   Period Weeks   Status New           OT Long Term Goals - 07/22/16 1636      OT LONG TERM GOAL #1   Title Function on PRWHE decrease by at least 20 points    Baseline Function at eval 45.5/50   Time 5   Period Weeks   Status  New     OT LONG TERM GOAL #2   Title Wrist AROM in all planes increase by 10 to 15 degrees to turn doorknob and use hand in more than 50% of ADL's    Baseline See flowsheet    Time 6   Period Weeks   Status New     OT LONG TERM GOAL #3   Title Grip and Prehension strenght in R hand increase to more than 50% compare to L hand    Baseline NT - pt between 3-4 wks s/p   Time 6   Period Weeks   Status New               Plan - 08/08/16 1022    Clinical Impression Statement Pt this date able to touch palm wiht fingertips during session - and showed progress in RD, UD , sup - scar looks much better but adhere - and extention / flexion the lease progress - add more PROM this date    OT Frequency 1x / week   OT Duration 4 weeks   OT Treatment/Interventions Self-care/ADL training;Fluidtherapy;Splinting;Patient/family education;Therapeutic exercises;Contrast Bath;Parrafin;Manual Therapy;Passive range of motion;Scar mobilization   Plan cont to increase ROM - add BTE maybe   OT Home Exercise Plan See pt instruction   Consulted and Agree with Plan of Care Patient      Patient will benefit from skilled therapeutic intervention in order to improve the following deficits and impairments:  Decreased range of motion, Impaired flexibility, Increased edema, Decreased knowledge of precautions, Pain, Impaired UE functional use, Decreased scar mobility, Decreased strength  Visit Diagnosis: Pain in right wrist  Stiffness of right wrist, not elsewhere classified  Stiffness of right hand, not elsewhere classified  Muscle weakness (generalized)  Scar condition and fibrosis of skin    Problem List There are no active problems to display for this patient.   [name],  OTR/L,CLT 08/08/2016, 11:11 AM  [facility] [address] [place], [place], [address] Phone: [phone]   Fax:  [phone]  Name: Barbara Ivorythel A  Butler MRN: [id number] Date of Birth: Jan 21, 1930

## 2016-08-13 ENCOUNTER — Ambulatory Visit: Payer: Medicare HMO | Admitting: Occupational Therapy

## 2016-08-13 DIAGNOSIS — M25631 Stiffness of right wrist, not elsewhere classified: Secondary | ICD-10-CM

## 2016-08-13 DIAGNOSIS — M25641 Stiffness of right hand, not elsewhere classified: Secondary | ICD-10-CM

## 2016-08-13 DIAGNOSIS — M6281 Muscle weakness (generalized): Secondary | ICD-10-CM

## 2016-08-13 DIAGNOSIS — L905 Scar conditions and fibrosis of skin: Secondary | ICD-10-CM

## 2016-08-13 DIAGNOSIS — M25531 Pain in right wrist: Secondary | ICD-10-CM | POA: Diagnosis not present

## 2016-08-13 NOTE — Therapy (Signed)
[facility] [address] , [place], [address] Phone: [phone]   Fax:  [phone]  Occupational Therapy Treatment  Patient Details  Name: Barbara Butler MRN: [id number] Date of Birth: 05/17/1930 Referring Provider: Rosita KeaMenz  Encounter Date: 08/13/2016      OT End of Session - 08/13/16 1408    Visit Number 6   Number of Visits 12   Date for OT Re-Evaluation 09/02/16   OT Start Time 1348   OT Stop Time 1440   OT Time Calculation (min) 52 min   Activity Tolerance Patient tolerated treatment well   Behavior [facility] Therapy [facility] for tasks assessed/performed      Past Medical History:  Diagnosis Date  . Anemia   . Diabetes mellitus without complication (HCC)    diet controlled  . Diabetic neuropathy (HCC)   . Hypertension   . Hypothyroidism     Past Surgical History:  Procedure Laterality Date  . ORIF WRIST FRACTURE Right 06/28/2016   Procedure: OPEN REDUCTION INTERNAL FIXATION (ORIF) WRIST FRACTURE;  Surgeon: Kennedy BuckerMichael Menz, MD;  Location: [facility] ORS;  Service: Orthopedics;  Laterality: Right;  . SALPINGECTOMY      There were no vitals filed for this visit.      Subjective Assessment - 08/13/16 1403    Subjective  DOing better - look I can touch my palm - using it more - not wearing splint during day - still sleeping with it    Patient Stated Goals Want to get the use of my R hand back - cannot use it - writing , typing dressing , bathing , eating ect    Currently in Pain? No/denies            [facility] - 08/13/16 0001      AROM   Right Wrist Extension 35 Degrees   Right Wrist Flexion 44 Degrees   Right Wrist Radial Deviation 25 Degrees   Right Wrist Ulnar Deviation 14 Degrees     Strength   Right Hand Grip (lbs) 22   Right Hand Lateral Pinch 10 lbs   Right Hand 3 Point Pinch 5 lbs   Left Hand Grip (lbs) 38   Left Hand Lateral Pinch 14 lbs   Left Hand 3 Point Pinch 8 lbs                   OT Treatments/Exercises (OP) - 08/13/16 0001      RUE Fluidotherapy   Number Minutes Fluidotherapy 10 Minutes   RUE Fluidotherapy Location Hand;Wrist   Comments [facility] to increase ROM and decrease stiffness     Fluidotherapy done  Scar massage done - scar mobs done  and vibration - with great success- done kinesiotape done 30% either side and 100 % pull across - review precautions Pt to cont with it and provided new cica scar pad  CPM for BTE for wrist extention and flexion each 200 sec  15 lbs for BTE 701 tool for wrist flexion  120 sec  1 lbs weight for wrist flexion, ext, Sup/PRO, RD and UD  12 reps  Add to HEP    PROM composite flexion of digits done - pt to do at home - able to touch palm this date actively Light blue putty for gripping,lat and 3 point grip  As well as rolling and pinching alternate digits - 10 reps  Add to HEP  Benik neoprene for home use - pt to wear 50/50 on and off when at home -hard one outside of house              OT Education - 08/13/16 1407    Education provided Yes   Education Details HEP upgrade - putty increase HEP   Person(s) Educated Patient   Methods Explanation;Demonstration;Tactile cues;Verbal cues   Comprehension Verbal cues required;Returned demonstration;Verbalized understanding          OT Short Term Goals - 07/22/16 1359      OT SHORT TERM GOAL #1   Title Pt fisting of R hand improve to be able to touch palm and hold objects like utencils and brush   Baseline MC's 65-80 , PIP 75-85   Time 2   Period Weeks   Status New     OT SHORT TERM GOAL #2   Title Pt to be ind in Scar healing and management to allow full flexion of digits and progress with wrist flexion and extention   Baseline attempted to remove sterri stips - not closed yet - preventing full flexion of digits and wrist extention    Time 3   Period Weeks   Status New     OT SHORT TERM GOAL  #3   Title Pain on PRWHE decrease by 10 points    Baseline Pain on PRWHE at eval 25/50   Time 3   Period Weeks   Status New           OT Long Term Goals - 07/22/16 1636      OT LONG TERM GOAL #1   Title Function on PRWHE decrease by at least 20 points    Baseline Function at eval 45.5/50   Time 5   Period Weeks   Status New     OT LONG TERM GOAL #2   Title Wrist AROM in all planes increase by 10 to 15 degrees to turn doorknob and use hand in more than 50% of ADL's    Baseline See flowsheet    Time 6   Period Weeks   Status New     OT LONG TERM GOAL #3   Title Grip and Prehension strenght in R hand increase to more than 50% compare to L hand    Baseline NT - pt between 3-4 wks s/p   Time 6   Period Weeks   Status New               Plan - 08/13/16 1408    Clinical Impression Statement Pt making progress in ROM at digits - able to touch palm coming in - wrist flexion and extention slowest - add CPM this date in clinic - did 1lbs for HEP and increase putty HEP - and tape scar  to increase scar mobs    Rehab Potential Good   OT Frequency 2x / week   OT Duration 4 weeks   OT Treatment/Interventions Self-care/ADL training;Fluidtherapy;Splinting;Patient/family education;Therapeutic exercises;Contrast Bath;Parrafin;Manual Therapy;Passive range of motion;Scar mobilization   Plan assess how did with kinesiotape    OT Home Exercise Plan See pt instruction   Consulted and Agree with Plan of Care Patient      Patient will benefit from skilled therapeutic intervention in order to improve the following deficits and impairments:  Decreased range of motion, Impaired flexibility, Increased edema, Decreased knowledge of precautions, Pain, Impaired UE functional use, Decreased scar mobility, Decreased strength  Visit Diagnosis: Pain in right wrist  Stiffness of  right wrist, not elsewhere classified  Stiffness of right hand, not elsewhere classified  Muscle weakness  (generalized)  Scar condition and fibrosis of skin    Problem List There are no active problems to display for this patient.   Oletta Cohn OTR/L,CLT 08/13/2016, 2:49 PM  [facility] SPORTS MEDICINE [address], [place], [address] Phone: [phone]   Fax:  [phone]  Name: Barbara Butler MRN: [id number] Date of Birth: 09-17-1929

## 2016-08-13 NOTE — Patient Instructions (Addendum)
done kinesiotape done 30% either side and 100 % pull across - review precautions Pt to cont with it and provided new cica scar pad  Cont with PROM for wrist  Focus flexion and extentoion 1 lbs weight for wrist flexion, ext, Sup/PRO, RD and UD  12 reps  Add to HEP    PROM composite flexion of digits done - pt to do at home - able to touch palm this date actively Light blue putty for gripping,lat and 3 point grip  As well as rolling and pinching alternate digits - 10 reps  Add to HEP    Benik neoprene for home use - pt to wear 50/50 on and off when at home -hard one outside of house

## 2016-08-15 ENCOUNTER — Ambulatory Visit: Payer: Medicare HMO | Admitting: Occupational Therapy

## 2016-08-15 DIAGNOSIS — M25631 Stiffness of right wrist, not elsewhere classified: Secondary | ICD-10-CM

## 2016-08-15 DIAGNOSIS — M25531 Pain in right wrist: Secondary | ICD-10-CM | POA: Diagnosis not present

## 2016-08-15 DIAGNOSIS — M6281 Muscle weakness (generalized): Secondary | ICD-10-CM

## 2016-08-15 DIAGNOSIS — M25641 Stiffness of right hand, not elsewhere classified: Secondary | ICD-10-CM

## 2016-08-15 DIAGNOSIS — L905 Scar conditions and fibrosis of skin: Secondary | ICD-10-CM

## 2016-08-15 NOTE — Therapy (Signed)
[facility] [address], [place], [address] Phone: [phone]   Fax:  [phone]  Occupational Therapy Treatment  Patient Details  Name: Barbara Butler MRN: [id number] Date of Birth: 05/22/1930 Referring Provider: Rosita Kea  Encounter Date: 08/15/2016      OT End of Session - 08/15/16 1045    Visit Number 7   Number of Visits 12   Date for OT Re-Evaluation 09/02/16   OT Start Time 1034   OT Stop Time 1114   OT Time Calculation (min) 40 min   Activity Tolerance Patient tolerated treatment well   Behavior During Therapy [facility] for tasks assessed/performed      Past Medical History:  Diagnosis Date  . Anemia   . Diabetes mellitus without complication (HCC)    diet controlled  . Diabetic neuropathy (HCC)   . Hypertension   . Hypothyroidism     Past Surgical History:  Procedure Laterality Date  . ORIF WRIST FRACTURE Right 06/28/2016   Procedure: OPEN REDUCTION INTERNAL FIXATION (ORIF) WRIST FRACTURE;  Surgeon: Kennedy Bucker, MD;  Location: [facility] ORS;  Service: Orthopedics;  Laterality: Right;  . SALPINGECTOMY      There were no vitals filed for this visit.      Subjective Assessment - 08/15/16 1035    Subjective  I did not do as much yesterday - both my hands was just ache because of the rain - but I am able to use my hand more and things is getting easier - it is just weak   Patient Stated Goals Want to get the use of my R hand back - cannot use it - writing , typing dressing , bathing , eating ect    Currently in Pain? No/denies                      OT Treatments/Exercises (OP) - 08/15/16 0001      RUE Fluidotherapy   Number Minutes Fluidotherapy 10 Minutes   RUE Fluidotherapy Location Hand;Wrist   Comments AROM for wrist in all planes - and thumb to increase ROM and decrease pain     Removed kinesiotape - and scar moving better   Fluidotherapy done  Scar massage done - scar  mobs done and vibration - with great success- repeat kinesiotape again 30% either side and 100 % pull across - review precautions Pt to cont with it and provided new cica scar pad   CPM for BTE for wrist extention  2 x 200 sec and flexion 200 sec  extention increase after 2nd time 48 degrees   15 lbs for BTE 701 tool for wrist flexion  120 sec  1 lbs weight for wrist flexion, ext, Sup/PRO, RD and UD ( to side)  12 reps   Light blue putty for gripping,lat and 3 point grip  As well as rolling and pinching alternate digits - 10 reps    Benik neoprene for home use - pt to wear 50/50 on and off when at home -hard one outside of house              OT Education - 08/15/16 1045    Education provided Yes   Education Details HEP reviewed    Person(s) Educated Patient   Methods Explanation;Demonstration;Tactile cues;Verbal cues   Comprehension Verbalized understanding;Verbal cues required;Returned demonstration          OT Short Term Goals - 07/22/16 1359  OT SHORT TERM GOAL #1   Title Pt fisting of R hand improve to be able to touch palm and hold objects like utencils and brush   Baseline MC's 65-80 , PIP 75-85   Time 2   Period Weeks   Status New     OT SHORT TERM GOAL #2   Title Pt to be ind in Scar healing and management to allow full flexion of digits and progress with wrist flexion and extention   Baseline attempted to remove sterri stips - not closed yet - preventing full flexion of digits and wrist extention    Time 3   Period Weeks   Status New     OT SHORT TERM GOAL #3   Title Pain on PRWHE decrease by 10 points    Baseline Pain on PRWHE at eval 25/50   Time 3   Period Weeks   Status New           OT Long Term Goals - 07/22/16 1636      OT LONG TERM GOAL #1   Title Function on PRWHE decrease by at least 20 points    Baseline Function at eval 45.5/50   Time 5   Period Weeks   Status New     OT LONG TERM GOAL #2   Title Wrist AROM  in all planes increase by 10 to 15 degrees to turn doorknob and use hand in more than 50% of ADL's    Baseline See flowsheet    Time 6   Period Weeks   Status New     OT LONG TERM GOAL #3   Title Grip and Prehension strenght in R hand increase to more than 50% compare to L hand    Baseline NT - pt between 3-4 wks s/p   Time 6   Period Weeks   Status New               Plan - 08/15/16 1046    Clinical Impression Statement Pt showed increase wrist extention in session -scar adhesion and  digits flexion  improving well , wrist flexion , sup/pro - report increase functional us of R dominant hand - cont to focus on wrist ROM and strength - as well as grip /prehension    Rehab Potential Good   OT Frequency 2x / week   OT Duration 4 weeks   OT Treatment/Interventions Self-care/ADL training;Fluidtherapy;Splinting;Patient/family education;Therapeutic exercises;Contrast Bath;Parrafin;Manual Therapy;Passive range of motion;Scar mobilization   Plan upgrade putty as needed - progress at scar   OT Home Exercise Plan See pt instruction   Consulted and Agree with Plan of Care Patient      Patient will benefit from skilled therapeutic intervention in order to improve the following deficits and impairments:  Decreased range of motion, Impaired flexibility, Increased edema, Decreased knowledge of precautions, Pain, Impaired UE functional use, Decreased scar mobility, Decreased strength  Visit Diagnosis: Pain in right wrist  Stiffness of right wrist, not elsewhere classified  Stiffness of right hand, not elsewhere classified  Muscle weakness (generalized)  Scar condition and fibrosis of skin    Problem List There are no active problems to display for this patient.   Barbara Butler,  OTR/L,CLT 08/15/2016, 11:16 AM  [facility] [address] [place], [place], [address] Phone: [phone]   Fax:  [phone]  Name:  Barbara Butler MRN: [id number] Date of Birth: 01/30/1930

## 2016-08-15 NOTE — Patient Instructions (Addendum)
repeat kinesiotape again 30% either side and 100 % pull across - review precautions  cont with same HEP for PROM , AROM  And  1 lbs weight for wrist flexion, ext, Sup/PRO, RD and UD ( to side)  12 reps   Light blue putty for gripping,lat and 3 point grip  As well as rolling and pinching alternate digits - 10 reps    Benik neoprene for home use - pt to wear 50/50 on and off when at home -hard one outside of house

## 2016-08-20 ENCOUNTER — Ambulatory Visit: Payer: Medicare HMO | Admitting: Occupational Therapy

## 2016-08-20 DIAGNOSIS — L905 Scar conditions and fibrosis of skin: Secondary | ICD-10-CM

## 2016-08-20 DIAGNOSIS — M25531 Pain in right wrist: Secondary | ICD-10-CM | POA: Diagnosis not present

## 2016-08-20 DIAGNOSIS — M25641 Stiffness of right hand, not elsewhere classified: Secondary | ICD-10-CM

## 2016-08-20 DIAGNOSIS — M6281 Muscle weakness (generalized): Secondary | ICD-10-CM

## 2016-08-20 DIAGNOSIS — M25631 Stiffness of right wrist, not elsewhere classified: Secondary | ICD-10-CM

## 2016-08-20 NOTE — Therapy (Signed)
[facility] MEDICINE [address], [place], [address] Phone: [phone]   Fax:  [phone]  Occupational Therapy Treatment  Patient Details  Name: Barbara Butler MRN: [id number] Date of Birth: 04-21-1930 Referring Provider: Rosita Kea  Encounter Date: 08/20/2016      OT End of Session - 08/20/16 1429    Visit Number 8   Number of Visits 12   Date for OT Re-Evaluation 09/02/16   OT Start Time 1350   OT Stop Time 1438   OT Time Calculation (min) 48 min   Activity Tolerance Patient tolerated treatment well   Behavior During Therapy [facility] for tasks assessed/performed      Past Medical History:  Diagnosis Date  . Anemia   . Diabetes mellitus without complication (HCC)    diet controlled  . Diabetic neuropathy (HCC)   . Hypertension   . Hypothyroidism     Past Surgical History:  Procedure Laterality Date  . ORIF WRIST FRACTURE Right 06/28/2016   Procedure: OPEN REDUCTION INTERNAL FIXATION (ORIF) WRIST FRACTURE;  Surgeon: Kennedy Bucker, MD;  Location: [facility] ORS;  Service: Orthopedics;  Laterality: Right;  . SALPINGECTOMY      There were no vitals filed for this visit.                    OT Treatments/Exercises (OP) - 08/20/16 0001      RUE Fluidotherapy   Number Minutes Fluidotherapy 10 Minutes   RUE Fluidotherapy Location Hand;Wrist   Comments AROM for wrist in all range at [facility] to increase ROM and decrease pain       Fluidotherapy done  Scar massage done - scar mobs done and vibration - with great success- Pt to cont with it and provided new cica scar pad   CPM for BTE for wrist extention  2 x 200 sec and flexion 200 sec  extention increase after 2nd time 50  degrees   Wrist CPM for flexion 200 sec   15 lbs for BTE 701 tool for wrist flexion  120 sec  1 lbs weight for wrist flexion, ext, Sup/PRO, RD and UD ( at side)  12 reps  Ball on wall for wrist extention 2 x 30 sec -add to  HEP  Light blue putty for gripping,lat and 3 point grip  As well as rolling and pinching alternate digits - 10 reps    Benik neoprene for home use - pt to wear 50/50 on and off when at home - and sleep with soft one - maybe in middle of day on for 2 hrs the hard one            OT Education - 08/20/16 1429    Education provided Yes   Education Details HEP and splint wearing    Person(s) Educated Patient   Methods Demonstration;Explanation;Tactile cues;Verbal cues   Comprehension Verbal cues required;Returned demonstration;Verbalized understanding          OT Short Term Goals - 07/22/16 1359      OT SHORT TERM GOAL #1   Title Pt fisting of R hand improve to be able to touch palm and hold objects like utencils and brush   Baseline MC's 65-80 , PIP 75-85   Time 2   Period Weeks   Status New     OT SHORT TERM GOAL #2   Title Pt to be ind in Scar healing and management to allow full flexion of digits and progress with  wrist flexion and extention   Baseline attempted to remove sterri stips - not closed yet - preventing full flexion of digits and wrist extention    Time 3   Period Weeks   Status New     OT SHORT TERM GOAL #3   Title Pain on PRWHE decrease by 10 points    Baseline Pain on PRWHE at eval 25/50   Time 3   Period Weeks   Status New           OT Long Term Goals - 07/22/16 1636      OT LONG TERM GOAL #1   Title Function on PRWHE decrease by at least 20 points    Baseline Function at eval 45.5/50   Time 5   Period Weeks   Status New     OT LONG TERM GOAL #2   Title Wrist AROM in all planes increase by 10 to 15 degrees to turn doorknob and use hand in more than 50% of ADL's    Baseline See flowsheet    Time 6   Period Weeks   Status New     OT LONG TERM GOAL #3   Title Grip and Prehension strenght in R hand increase to more than 50% compare to L hand    Baseline NT - pt between 3-4 wks s/p   Time 6   Period Weeks   Status New                Plan - 08/20/16 1431    Clinical Impression Statement Pt show increase flexion of digits , able to do putty and 1 lbs for wrist in all planes - wrist extention and flexion still mostly impaired for ROM - cont to increase strength to tolerance    Rehab Potential Good   OT Frequency 2x / week   OT Duration 4 weeks   OT Treatment/Interventions Self-care/ADL training;Fluidtherapy;Splinting;Patient/family education;Therapeutic exercises;Contrast Bath;Parrafin;Manual Therapy;Passive range of motion;Scar mobilization   Plan assess progress next session    OT Home Exercise Plan See pt instruction   Consulted and Agree with Plan of Care Patient      Patient will benefit from skilled therapeutic intervention in order to improve the following deficits and impairments:  Decreased range of motion, Impaired flexibility, Increased edema, Decreased knowledge of precautions, Pain, Impaired UE functional use, Decreased scar mobility, Decreased strength  Visit Diagnosis: Pain in right wrist  Stiffness of right wrist, not elsewhere classified  Stiffness of right hand, not elsewhere classified  Muscle weakness (generalized)  Scar condition and fibrosis of skin    Problem List There are no active problems to display for this patient.   [name],  OTR/L,CLT 08/20/2016, 2:54 PM  [facility] [facility] [address] [place], [place], [address] Phone: [phone]   Fax:  [phone]  Name: Barbara Butler MRN: [id number] Date of Birth: 03/13/1930

## 2016-08-20 NOTE — Patient Instructions (Addendum)
  Same HEP - but can do wrist extention with ball on wall 2 x 30-45 sec  Do HEP 2nd session earlier in evening - to decrease pain at night time - soft splint at night time

## 2016-08-22 ENCOUNTER — Ambulatory Visit: Payer: Medicare HMO | Admitting: Occupational Therapy

## 2016-08-22 DIAGNOSIS — M25531 Pain in right wrist: Secondary | ICD-10-CM | POA: Diagnosis not present

## 2016-08-22 DIAGNOSIS — M25631 Stiffness of right wrist, not elsewhere classified: Secondary | ICD-10-CM

## 2016-08-22 DIAGNOSIS — M6281 Muscle weakness (generalized): Secondary | ICD-10-CM

## 2016-08-22 DIAGNOSIS — L905 Scar conditions and fibrosis of skin: Secondary | ICD-10-CM

## 2016-08-22 DIAGNOSIS — M25641 Stiffness of right hand, not elsewhere classified: Secondary | ICD-10-CM

## 2016-08-22 NOTE — Therapy (Signed)
[facility] [address] [place], [place], [address] Phone: [phone]   Fax:  [phone]  Occupational Therapy Treatment  Patient Details  Name: Barbara Butler MRN: [id number] Date of Birth: 01/16/1930 Referring Provider: Rosita Butler  Encounter Date: 08/22/2016      OT End of Session - 08/22/16 1256    Visit Number 9   Number of Visits 12   Date for OT Re-Evaluation 09/02/16   OT Start Time 1128   OT Stop Time 1214   OT Time Calculation (min) 46 min   Activity Tolerance Patient tolerated treatment well   Behavior During Therapy [facility] for [facility] assessed/performed      Past Medical History:  Diagnosis Date  . Anemia   . Diabetes mellitus without complication (HCC)    diet controlled  . Diabetic neuropathy (HCC)   . Hypertension   . Hypothyroidism     Past Surgical History:  Procedure Laterality Date  . ORIF WRIST FRACTURE Right 06/28/2016   Procedure: OPEN REDUCTION INTERNAL FIXATION (ORIF) WRIST FRACTURE;  Surgeon: Barbara BuckerMichael Menz, MD;  Location: [facility] ORS;  Service: Orthopedics;  Laterality: Right;  . SALPINGECTOMY      There were no vitals filed for this visit.      Subjective Assessment - 08/22/16 1129    Patient Stated Goals Want to get the use of my R hand back - cannot use it - writing , typing dressing , bathing , eating ect                       OT Treatments/Exercises (OP) - 08/22/16 0001      RUE Fluidotherapy   Number Minutes Fluidotherapy 10 Minutes   RUE Fluidotherapy Location Hand;Wrist   [facility] ROM in fist and wrist       Fluidotherapy done  Scar massage done - scar mobs done and vibration - with great success- Kinesiotape done over scar - 20% parallel and 2 at  100% across    CPM for BTE for wrist extention 2 x 200 sec     Place and hold wrist extention in fist - 12 reps  2 lbs for RD, UD , sup/pro - no pain  Cont and done 1  lbs for for wrist flexion, ext 12 reps   Add teal putty  to Light blue putty for gripping,lat and 3 point grip  12-15 Pulling and  twisting  12 reps each  Keep putty exercises pain free   Can do HEP 2 sets in 3-4 days if pain free   Benik neoprene for home use - pt to wear 50/50 on and off when at home - and sleep with soft one - maybe in middle of day on for 2 hrs the hard one            OT Education - 08/22/16 1256    Education provided Yes   Education Details updated HEP for 2 lbs and increase putty resistance    Person(s) Educated Patient   Methods Explanation;Demonstration;Tactile cues;Verbal cues;Handout   Comprehension Verbal cues required;Returned demonstration;Verbalized understanding          OT Short Term Goals - 07/22/16 1359      OT SHORT TERM GOAL #1   Title Pt fisting of R hand improve to be able to touch palm and hold objects like utencils and brush   Baseline MC's 65-80 , PIP 75-85  Time 2   Period Weeks   Status New     OT SHORT TERM GOAL #2   Title Pt to be ind in Scar healing and management to allow full flexion of digits and progress with wrist flexion and extention   Baseline attempted to remove sterri stips - not closed yet - preventing full flexion of digits and wrist extention    Time 3   Period Weeks   Status New     OT SHORT TERM GOAL #3   Title Pain on PRWHE decrease by 10 points    Baseline Pain on PRWHE at eval 25/50   Time 3   Period Weeks   Status New           OT Long Term Goals - 07/22/16 1636      OT LONG TERM GOAL #1   Title Function on PRWHE decrease by at least 20 points    Baseline Function at eval 45.5/50   Time 5   Period Weeks   Status New     OT LONG TERM GOAL #2   Title Wrist AROM in all planes increase by 10 to 15 degrees to turn doorknob and use hand in more than 50% of ADL's    Baseline See flowsheet    Time 6   Period Weeks   Status New     OT LONG TERM GOAL #3   Title Grip and Prehension  strenght in R hand increase to more than 50% compare to L hand    Baseline NT - pt between 3-4 wks s/p   Time 6   Period Weeks   Status New               Plan - 08/22/16 1256    Clinical Impression Statement Pt show increase strength, decrease pain - increase fisting  with increase functional use of R hand - pt to focus on ROM for wrist flexion and extention and increase strength HEP to 2 sets in 3-4 days - pain free    Rehab Potential Good   OT Frequency 1x / week   OT Duration 4 weeks   OT Treatment/Interventions Self-care/ADL training;Fluidtherapy;Splinting;Patient/family education;Therapeutic exercises;Contrast Bath;Parrafin;Manual Therapy;Passive range of motion;Scar mobilization   Plan cont to upgrade and focus on flexion and extention of wrist   OT Home Exercise Plan See pt instruction   Consulted and Agree with Plan of Care Patient      Patient will benefit from skilled therapeutic intervention in order to improve the following deficits and impairments:  Decreased range of motion, Impaired flexibility, Increased edema, Decreased knowledge of precautions, Pain, Impaired UE functional use, Decreased scar mobility, Decreased strength  Visit Diagnosis: Pain in right wrist  Stiffness of right wrist, not elsewhere classified  Stiffness of right hand, not elsewhere classified  Muscle weakness (generalized)  Scar condition and fibrosis of skin    Problem List There are no active problems to display for this patient.   Barbara Butler OTR/L,CLT 08/22/2016, 1:00 PM  [facility] [place] [facility] [address] [place], [address] Phone: [phone]   Fax:  [phone]  Name: Barbara Butler MRN: [id number] Date of Birth: 07/26/1929

## 2016-08-22 NOTE — Patient Instructions (Addendum)
Cont with Same HEP - focus on flexion and ext wrist PROM  2 lbs for RD, UD , sup/pro - no pain  Cont and done 1 lbs for for wrist flexion, ext 12 reps   Add teal putty  to Light blue putty for gripping,lat and 3 point grip  12-15 Pulling and  twisting  12 reps each  Keep putty exercises pain free   Can do HEP 2 sets in 3-4 days if pain free   Benik neoprene for home use - pt to wear 50/50 on and off when at home - and sleep with soft one - maybe in middle of day on for 2 hrs the hard one

## 2016-08-29 ENCOUNTER — Ambulatory Visit: Payer: Medicare HMO | Admitting: Occupational Therapy

## 2016-08-29 DIAGNOSIS — M25531 Pain in right wrist: Secondary | ICD-10-CM | POA: Diagnosis not present

## 2016-08-29 DIAGNOSIS — L905 Scar conditions and fibrosis of skin: Secondary | ICD-10-CM

## 2016-08-29 DIAGNOSIS — M25631 Stiffness of right wrist, not elsewhere classified: Secondary | ICD-10-CM

## 2016-08-29 DIAGNOSIS — M25641 Stiffness of right hand, not elsewhere classified: Secondary | ICD-10-CM

## 2016-08-29 DIAGNOSIS — M6281 Muscle weakness (generalized): Secondary | ICD-10-CM

## 2016-08-29 NOTE — Patient Instructions (Addendum)
Cont with Scar massage done Pt to focus on scar massage and night time cica scar pad    Wrist extention - table slides 20 reps  And wall slides 10 reps  Add to HEP  Cont with prayer stretch and over edge of table   cont with flexion stretch   Cont with 2 lbs weight  And putty for HEP     Cont with weaning out of splints - stop sleeping

## 2016-08-29 NOTE — Therapy (Signed)
[place] [facility] [address] [place], [place], [address] Phone: [phone]   Fax:  [phone]  Occupational Therapy Treatment  Patient Details  Name: Barbara [facility] MRN: [id number] Date of Birth: 05/16/1930 Referring Provider: Rosita KeaMenz  Encounter Date: 08/29/2016      OT End of Session - 08/29/16 1755    Visit Number 10   Number of Visits 12   Date for OT Re-Evaluation 09/02/16   OT Start Time 1146   OT Stop Time 1235   OT Time Calculation (min) 49 min   Activity Tolerance Patient tolerated treatment well   Behavior During Therapy [facility] for tasks assessed/performed      Past Medical History:  Diagnosis Date  . Anemia   . Diabetes mellitus without complication (HCC)    diet controlled  . Diabetic neuropathy (HCC)   . Hypertension   . Hypothyroidism     Past Surgical History:  Procedure Laterality Date  . ORIF WRIST FRACTURE Right 06/28/2016   Procedure: OPEN REDUCTION INTERNAL FIXATION (ORIF) WRIST FRACTURE;  Surgeon: Kennedy BuckerMichael Menz, MD;  Location: [facility] ORS;  Service: Orthopedics;  Laterality: Right;  . SALPINGECTOMY      There were no vitals filed for this visit.      Subjective Assessment - 08/29/16 1148    Subjective  Wearing splint less than 20% , still sleeping with - using to wash dishes , cooking , cleaning , - feel like stronger than week ago - can turn carkey    Patient Stated Goals Want to get the use of my R hand back - cannot use it - writing , typing dressing , bathing , eating ect    Currently in Pain? No/denies            [facility] Assessment - 08/29/16 0001      AROM   Right Wrist Extension 42 Degrees   Right Wrist Flexion 51 Degrees   Right Wrist Radial Deviation 22 Degrees   Right Wrist Ulnar Deviation 18 Degrees     Strength   Right Hand Grip (lbs) 32   Right Hand Lateral Pinch 12 lbs   Right Hand 3 Point Pinch 9 lbs   Left Hand Grip (lbs) 38   Left Hand Lateral Pinch 14 lbs    Left Hand 3 Point Pinch 11 lbs                  OT Treatments/Exercises (OP) - 08/29/16 0001      RUE Fluidotherapy   Number Minutes Fluidotherapy 10 Minutes   RUE Fluidotherapy Location Wrist   Comments wrist AROM for flexion and extention prior to review of HEP        Measure grip and prehension strength  As well as ROM  See flowsheet   Fluidotherapy done  Scar massage done - scar mobs done and vibration - Pt to focus on scar massage and night time cica scar pad    Wrist extention - table slides 20 reps  And wall slides 10 reps  Add to HEP  Cont with prayer stretch and over edge of table   cont with flexion stretch   Cont with 2 lbs weight  And putty for HEP   PRWHE done with pt for pain and function   Cont with weaning out of splints - stop sleeping             OT Education - 08/29/16 1755    Education provided  Yes   Education Details HEP changes - focus on wrist ext, scar    Person(s) Educated Patient   Methods Explanation;Demonstration;Tactile cues;Verbal cues   Comprehension Verbal cues required;Returned demonstration;Verbalized understanding          OT Short Term Goals - 08/29/16 1757      OT SHORT TERM GOAL #1   Title Pt fisting of R hand improve to be able to touch palm and hold objects like utencils and brush   Status Achieved     OT SHORT TERM GOAL #2   Title Pt to be ind in Scar healing and management to allow full flexion of digits and progress with wrist flexion and extention   Status Achieved     OT SHORT TERM GOAL #3   Title Pain on PRWHE decrease by 10 points    Baseline Pain on PRWHE at eval 25/50 and now 12/50   Status Achieved           OT Long Term Goals - 08/29/16 1758      OT LONG TERM GOAL #1   Title Function on PRWHE decrease by at least 20 points    Baseline Function at eval 45.5/50 and now 20.5/50   Time 4   Period Weeks   Status On-going     OT LONG TERM GOAL #2   Title Wrist AROM in all  planes increase by 10 to 15 degrees to turn doorknob and use hand in more than 50% of ADL's    Baseline extention and flexion impaired - increase functional use    Time 4   Period Weeks   Status On-going     OT LONG TERM GOAL #3   Title Grip and Prehension strenght in R hand increase to more than 50% compare to L hand    Baseline Grip 32 R, L 38 lbs   Status Achieved               Plan - 08/29/16 1756    Clinical Impression Statement Pt showed progress in grip , prehension , and wrist - still impaired wrist extention , flexion and scar tissue - pt to cont with HEP and with changes - see MD Monday - recommend 1 x wks for 3 more wks    Rehab Potential Good   OT Frequency 1x / week   OT Duration 4 weeks   OT Treatment/Interventions Self-care/ADL training;Fluidtherapy;Splinting;Patient/family education;Therapeutic exercises;Contrast Bath;Parrafin;Manual Therapy;Passive range of motion;Scar mobilization   Plan cont to upgrade and focus on flexion ,ext wrist - scar tissue    OT Home Exercise Plan See pt instruction   Consulted and Agree with Plan of Care Patient      Patient will benefit from skilled therapeutic intervention in order to improve the following deficits and impairments:  Decreased range of motion, Impaired flexibility, Increased edema, Decreased knowledge of precautions, Pain, Impaired UE functional use, Decreased scar mobility, Decreased strength  Visit Diagnosis: Pain in right wrist  Stiffness of right wrist, not elsewhere classified  Stiffness of right hand, not elsewhere classified  Muscle weakness (generalized)  Scar condition and fibrosis of skin    Problem List There are no active problems to display for this patient.   Oletta Cohn OTR/L,CLT 08/29/2016, 6:03 PM  [place] [facility] PH[facility] SPORTS MEDICINE [address], [place], [address] Phone: [phone]   Fax:  [phone]  Name: Barbara [facility] MRN: [id number] Date of Birth: 08-28-1929

## 2016-09-05 ENCOUNTER — Ambulatory Visit: Payer: Medicare HMO | Attending: Orthopedic Surgery | Admitting: Occupational Therapy

## 2016-09-05 DIAGNOSIS — L905 Scar conditions and fibrosis of skin: Secondary | ICD-10-CM | POA: Diagnosis present

## 2016-09-05 DIAGNOSIS — M6281 Muscle weakness (generalized): Secondary | ICD-10-CM

## 2016-09-05 DIAGNOSIS — M25531 Pain in right wrist: Secondary | ICD-10-CM

## 2016-09-05 DIAGNOSIS — M25631 Stiffness of right wrist, not elsewhere classified: Secondary | ICD-10-CM

## 2016-09-05 DIAGNOSIS — M25641 Stiffness of right hand, not elsewhere classified: Secondary | ICD-10-CM | POA: Diagnosis present

## 2016-09-05 NOTE — Patient Instructions (Addendum)
Cont with scar massage using  coban   Pt to focus on scar massage and night time cica scar pad   cont with PROM for  Wrist extention - table slides 20 reps ( pt was not using it - was sitting )  And wall slides 10 reps  prayer stretch and over edge of table   flexion stretch   Cont with 2 lbs weight  And upgraded HEP to teal putty for grip, 3point and lat grip - also add pulling and twisting using all digits  10 -12 reps

## 2016-09-05 NOTE — Therapy (Signed)
[place] [facility] [address] [place], [place], [address] Phone: [phone]   Fax:  [phone]  Occupational Therapy Treatment  Patient Details  Name: Barbara [facility] A Biermann MRN: [id number] Date of Birth: 10/14/1929 Referring Provider: Rosita KeaMenz  Encounter Date: 09/05/2016      OT End of Session - 09/05/16 1315    Visit Number 11   Number of Visits 12   Date for OT Re-Evaluation 09/19/16   OT Start Time 1304   OT Stop Time 1345   OT Time Calculation (min) 41 min   Activity Tolerance Patient tolerated treatment well   Behavior During Therapy [facility] for tasks assessed/performed      Past Medical History:  Diagnosis Date  . Anemia   . Diabetes mellitus without complication (HCC)    diet controlled  . Diabetic neuropathy (HCC)   . Hypertension   . Hypothyroidism     Past Surgical History:  Procedure Laterality Date  . ORIF WRIST FRACTURE Right 06/28/2016   Procedure: OPEN REDUCTION INTERNAL FIXATION (ORIF) WRIST FRACTURE;  Surgeon: Kennedy BuckerMichael Menz, MD;  Location: [facility] ORS;  Service: Orthopedics;  Laterality: Right;  . SALPINGECTOMY      There were no vitals filed for this visit.      Subjective Assessment - 09/05/16 1304    Subjective  Stopped wearing any splints - not sleeping with it - not really any pain -stiffness in the monring - still hard to reach behind    Patient Stated Goals Want to get the use of my R hand back - cannot use it - writing , typing dressing , bathing , eating ect             [facility] OT Assessment - 09/05/16 0001      AROM   Right Wrist Extension 46 Degrees   Right Wrist Flexion 58 Degrees   Right Wrist Radial Deviation 22 Degrees   Right Wrist Ulnar Deviation 18 Degrees     Strength   Right Hand Grip (lbs) 32   Right Hand Lateral Pinch 13 lbs   Right Hand 3 Point Pinch 11 lbs   Left Hand Grip (lbs) 38   Left Hand Lateral Pinch 14 lbs   Left Hand 3 Point Pinch 11 lbs                   OT Treatments/Exercises (OP) - 09/05/16 0001      RUE Paraffin   Number Minutes Paraffin 10 Minutes   RUE Paraffin Location Wrist   Comments prior to scar mobs and wrist extention decrease scar and increase ROM     Measure grip and prehension strength  as well as ROM  See flowsheet    Scar massage done - scar mobs done using coban - and pt ed on using  Also used xtractor with fisting AROM during xractor 5 reps  Pt to focus on scar massage and night time cica scar pad   cont with PROM for  Wrist extention - table slides 20 reps ( pt was not using it - was sitting )  And wall slides 10 reps  prayer stretch and over edge of table   flexion stretch   Cont with 2 lbs weight  And upgraded HEP to teal putty for grip, 3point and lat grip - also add pulling and twisting using all digits  10 -12 reps              OT Education -  09/05/16 1315    Education provided Yes   Education Details upgraded putty and HEP for ROM    Person(s) Educated Patient   Methods Explanation;Demonstration;Tactile cues;Verbal cues   Comprehension Verbal cues required;Returned demonstration;Verbalized understanding          OT Short Term Goals - 08/29/16 1757      OT SHORT TERM GOAL #1   Title Pt fisting of R hand improve to be able to touch palm and hold objects like utencils and brush   Status Achieved     OT SHORT TERM GOAL #2   Title Pt to be ind in Scar healing and management to allow full flexion of digits and progress with wrist flexion and extention   Status Achieved     OT SHORT TERM GOAL #3   Title Pain on PRWHE decrease by 10 points    Baseline Pain on PRWHE at eval 25/50 and now 12/50   Status Achieved           OT Long Term Goals - 08/29/16 1758      OT LONG TERM GOAL #1   Title Function on PRWHE decrease by at least 20 points    Baseline Function at eval 45.5/50 and now 20.5/50   Time 4   Period Weeks   Status On-going     OT LONG  TERM GOAL #2   Title Wrist AROM in all planes increase by 10 to 15 degrees to turn doorknob and use hand in more than 50% of ADL's    Baseline extention and flexion impaired - increase functional use    Time 4   Period Weeks   Status On-going     OT LONG TERM GOAL #3   Title Grip and Prehension strenght in R hand increase to more than 50% compare to L hand    Baseline Grip 32 R, L 38 lbs   Status Achieved               Plan - 09/05/16 1316    Clinical Impression Statement Pt showed some progress but scar adhesion still limiting her progress in wrist extention and flexion - pt to focus on scar massage , wrist ROM , and upgraded putty HEP - will reassess pt in 2 wks for possible discharge    Rehab Potential Good   OT Frequency Biweekly   OT Duration 2 weeks   OT Treatment/Interventions Self-care/ADL training;Fluidtherapy;Splinting;Patient/family education;Therapeutic exercises;Contrast Bath;Parrafin;Manual Therapy;Passive range of motion;Scar mobilization   Plan reassess progress and possible discharge    OT Home Exercise Plan See pt instruction   Consulted and Agree with Plan of Care Patient      Patient will benefit from skilled therapeutic intervention in order to improve the following deficits and impairments:  Decreased range of motion, Impaired flexibility, Increased edema, Decreased knowledge of precautions, Pain, Impaired UE functional use, Decreased scar mobility, Decreased strength  Visit Diagnosis: Pain in right wrist - Plan: Ot plan of care cert/re-cert  Stiffness of right wrist, not elsewhere classified - Plan: Ot plan of care cert/re-cert  Stiffness of right hand, not elsewhere classified - Plan: Ot plan of care cert/re-cert  Muscle weakness (generalized) - Plan: Ot plan of care cert/re-cert  Scar condition and fibrosis of skin - Plan: Ot plan of care cert/re-cert    Problem List There are no active problems to display for this patient.   Oletta Cohn OTR/L,CLT 09/05/2016, 3:48 PM  [place] [facility] SP[facility]TS  MEDICINE [address], [place], [address] Phone: [phone]   Fax:  [phone]  Name: AREEJ TAYLER MRN: [id number] Date of Birth: 09/26/1929

## 2016-09-19 ENCOUNTER — Ambulatory Visit: Payer: Medicare HMO | Admitting: Occupational Therapy

## 2016-09-26 ENCOUNTER — Ambulatory Visit: Payer: Medicare HMO | Admitting: Occupational Therapy

## 2016-09-26 DIAGNOSIS — M25641 Stiffness of right hand, not elsewhere classified: Secondary | ICD-10-CM

## 2016-09-26 DIAGNOSIS — M6281 Muscle weakness (generalized): Secondary | ICD-10-CM

## 2016-09-26 DIAGNOSIS — M25531 Pain in right wrist: Secondary | ICD-10-CM | POA: Diagnosis not present

## 2016-09-26 DIAGNOSIS — L905 Scar conditions and fibrosis of skin: Secondary | ICD-10-CM

## 2016-09-26 DIAGNOSIS — M25631 Stiffness of right wrist, not elsewhere classified: Secondary | ICD-10-CM

## 2016-09-26 NOTE — Therapy (Signed)
[facility]ICINE [address], [place], [address] Phone: [phone]   Fax:  [phone]  Occupational Therapy Treatment/dicharge  Patient Details  Name: Barbara Butler MRN: [id number] Date of Birth: 1930/01/13 Referring Provider: Rosita Kea  Encounter Date: 09/26/2016      OT End of Session - 09/26/16 1705    Visit Number 12   Number of Visits 12   Date for OT Re-Evaluation 09/26/16   OT Start Time 1615   OT Stop Time 1655   OT Time Calculation (min) 40 min   Activity Tolerance Patient tolerated treatment well   Behavior During Therapy [facility] for tasks assessed/performed      Past Medical History:  Diagnosis Date  . Anemia   . Diabetes mellitus without complication (HCC)    diet controlled  . Diabetic neuropathy (HCC)   . Hypertension   . Hypothyroidism     Past Surgical History:  Procedure Laterality Date  . ORIF WRIST FRACTURE Right 06/28/2016   Procedure: OPEN REDUCTION INTERNAL FIXATION (ORIF) WRIST FRACTURE;  Surgeon: Kennedy Bucker, MD;  Location: [facility] ORS;  Service: Orthopedics;  Laterality: Right;  . SALPINGECTOMY      There were no vitals filed for this visit.      Subjective Assessment - 09/26/16 1618    Subjective  I am using it more -surprise my self - still little stiff in the am - no pain -    Patient Stated Goals Want to get the use of my R hand back - cannot use it - writing , typing dressing , bathing , eating ect    Currently in Pain? No/denies            [facility] OT Assessment - 09/26/16 0001      AROM   Right Wrist Extension 50 Degrees   Right Wrist Flexion 60 Degrees   Right Wrist Radial Deviation 20 Degrees   Right Wrist Ulnar Deviation 18 Degrees     Strength   Right Hand Grip (lbs) 37   Right Hand Lateral Pinch 15 lbs   Right Hand 3 Point Pinch 11 lbs   Left Hand Grip (lbs) 58   Left Hand Lateral Pinch 15 lbs   Left Hand 3 Point Pinch 13 lbs     Measurements taken  and assess scar PRWHE done for pain and function - great progress - pain 4/50; function 2/50  Review with pt HEP to cont with  Wrist extention - prayer stretch, wall slides , table slides - and over edge of table - PROM  PROM for RD, UD  Putty - focus on 3 point grip - but cont with others   can do 2-4wks                      OT Education - 09/26/16 1705    Education provided Yes   Education Details HEP for discharge   Person(s) Educated Patient   Methods Explanation;Demonstration;Tactile cues;Verbal cues;Handout   Comprehension Verbalized understanding;Returned demonstration;Verbal cues required          OT Short Term Goals - 09/26/16 1707      OT SHORT TERM GOAL #1   Title Pt fisting of R hand improve to be able to touch palm and hold objects like utencils and brush   Status Achieved     OT SHORT TERM GOAL #2   Title Pt to be ind in Scar healing and management to allow  full flexion of digits and progress with wrist flexion and extention   Status Achieved     OT SHORT TERM GOAL #3   Title Pain on PRWHE decrease by 10 points    Baseline Pain on PRWHE at eval 25/50 and now 4/50   Status Achieved           OT Long Term Goals - 09/26/16 1707      OT LONG TERM GOAL #1   Title Function on PRWHE decrease by at least 20 points    Baseline Function at eval 45.5/50 and now 2/50   Status Achieved     OT LONG TERM GOAL #2   Title Wrist AROM in all planes increase by 10 to 15 degrees to turn doorknob and use hand in more than 50% of ADL's    Status Achieved     OT LONG TERM GOAL #3   Title Grip and Prehension strenght in R hand increase to more than 50% compare to L hand    Status Achieved               Plan - 09/26/16 1706    Clinical Impression Statement Pt showed some good progress in ROM wrist flexion , grip and lat grip - report increase use in ADL's and IADL's - as well as no pain - pt to cont with HEP for wrist ext, RD, UD and  3 point  pinch - pt can be discharge at this time with HEP    OT Treatment/Interventions Self-care/ADL training;Fluidtherapy;Splinting;Patient/family education;Therapeutic exercises;Contrast Bath;Parrafin;Manual Therapy;Passive range of motion;Scar mobilization   Plan discharge with HEP   OT Home Exercise Plan See pt instruction   Consulted and Agree with Plan of Care Patient      Patient will benefit from skilled therapeutic intervention in order to improve the following deficits and impairments:     Visit Diagnosis: Pain in right wrist  Stiffness of right wrist, not elsewhere classified  Stiffness of right hand, not elsewhere classified  Muscle weakness (generalized)  Scar condition and fibrosis of skin    Problem List There are no active problems to display for this patient.   [name],  OTR/L,CLT 09/26/2016, 5:09 PM  [facility] [address] [place], [place], [address] Phone: [phone]   Fax:  [phone]  Name: Barbara Butler MRN: [id number] Date of Birth: 12/26/1929

## 2016-09-26 NOTE — Patient Instructions (Signed)
2 wks to cont doing PROM for UD and RD For 4wks  PROM for wrist extention Putty for grip, lat and 3 point grip as well as pulling and twisting

## 2016-12-05 ENCOUNTER — Other Ambulatory Visit: Payer: Self-pay | Admitting: Internal Medicine

## 2016-12-05 DIAGNOSIS — Z1231 Encounter for screening mammogram for malignant neoplasm of breast: Secondary | ICD-10-CM

## 2016-12-24 ENCOUNTER — Ambulatory Visit
Admission: RE | Admit: 2016-12-24 | Discharge: 2016-12-24 | Disposition: A | Discharge status: Home / Self Care | Payer: Medicare HMO | Source: Ambulatory Visit | Attending: Internal Medicine | Admitting: Internal Medicine

## 2016-12-24 DIAGNOSIS — Z1231 Encounter for screening mammogram for malignant neoplasm of breast: Secondary | ICD-10-CM | POA: Insufficient documentation

## 2017-12-08 ENCOUNTER — Other Ambulatory Visit: Payer: Self-pay | Admitting: Internal Medicine

## 2017-12-08 DIAGNOSIS — Z1231 Encounter for screening mammogram for malignant neoplasm of breast: Secondary | ICD-10-CM

## 2017-12-31 ENCOUNTER — Ambulatory Visit
Admission: RE | Admit: 2017-12-31 | Discharge: 2017-12-31 | Disposition: A | Discharge status: Home / Self Care | Payer: Medicare Other | Source: Ambulatory Visit | Attending: Internal Medicine | Admitting: Internal Medicine

## 2017-12-31 DIAGNOSIS — Z1231 Encounter for screening mammogram for malignant neoplasm of breast: Secondary | ICD-10-CM | POA: Insufficient documentation

## 2018-09-30 ENCOUNTER — Ambulatory Visit: Admission: RE | Admit: 2018-09-30 | Payer: Medicare Other | Source: Home / Self Care

## 2018-09-30 ENCOUNTER — Encounter: Admission: RE | Payer: Self-pay | Source: Home / Self Care

## 2018-09-30 SURGERY — PHACOEMULSIFICATION, CATARACT, WITH IOL INSERTION
Anesthesia: Choice | Laterality: Right

## 2018-12-08 ENCOUNTER — Other Ambulatory Visit: Payer: Self-pay | Admitting: Internal Medicine

## 2018-12-08 DIAGNOSIS — Z1231 Encounter for screening mammogram for malignant neoplasm of breast: Secondary | ICD-10-CM

## 2019-01-05 ENCOUNTER — Other Ambulatory Visit: Payer: Self-pay

## 2019-01-05 ENCOUNTER — Ambulatory Visit
Admission: RE | Admit: 2019-01-05 | Discharge: 2019-01-05 | Disposition: A | Discharge status: Home / Self Care | Payer: Medicare Other | Source: Ambulatory Visit | Attending: Internal Medicine | Admitting: Internal Medicine

## 2019-01-05 DIAGNOSIS — Z1231 Encounter for screening mammogram for malignant neoplasm of breast: Secondary | ICD-10-CM | POA: Insufficient documentation

## 2019-01-25 ENCOUNTER — Encounter: Payer: Self-pay | Admitting: *Deleted

## 2019-01-25 ENCOUNTER — Other Ambulatory Visit
Admission: RE | Admit: 2019-01-25 | Discharge: 2019-01-25 | Disposition: A | Discharge status: Home / Self Care | Payer: Medicare Other | Source: Ambulatory Visit | Attending: Ophthalmology | Admitting: Ophthalmology

## 2019-01-25 ENCOUNTER — Other Ambulatory Visit: Payer: Self-pay

## 2019-01-25 DIAGNOSIS — Z1159 Encounter for screening for other viral diseases: Secondary | ICD-10-CM | POA: Insufficient documentation

## 2019-01-25 LAB — SARS CORONAVIRUS 2 (TAT 6-24 HRS): SARS Coronavirus 2: NEGATIVE

## 2019-01-28 ENCOUNTER — Encounter: Payer: Self-pay | Admitting: Certified Registered Nurse Anesthetist

## 2019-01-28 ENCOUNTER — Ambulatory Visit: Payer: Self-pay | Admitting: Certified Registered Nurse Anesthetist

## 2019-01-28 ENCOUNTER — Ambulatory Visit
Admission: RE | Admit: 2019-01-28 | Discharge: 2019-01-28 | Disposition: A | Discharge status: Home / Self Care | Payer: Medicare Other | Attending: Ophthalmology | Admitting: Ophthalmology

## 2019-01-28 ENCOUNTER — Encounter
Admission: RE | Disposition: A | Discharge status: Home / Self Care | Payer: Self-pay | Source: Home / Self Care | Attending: Ophthalmology

## 2019-01-28 ENCOUNTER — Other Ambulatory Visit: Payer: Self-pay

## 2019-01-28 DIAGNOSIS — I10 Essential (primary) hypertension: Secondary | ICD-10-CM | POA: Insufficient documentation

## 2019-01-28 DIAGNOSIS — H2511 Age-related nuclear cataract, right eye: Secondary | ICD-10-CM | POA: Insufficient documentation

## 2019-01-28 DIAGNOSIS — E1136 Type 2 diabetes mellitus with diabetic cataract: Secondary | ICD-10-CM | POA: Insufficient documentation

## 2019-01-28 DIAGNOSIS — Z7982 Long term (current) use of aspirin: Secondary | ICD-10-CM | POA: Diagnosis not present

## 2019-01-28 DIAGNOSIS — E079 Disorder of thyroid, unspecified: Secondary | ICD-10-CM | POA: Diagnosis not present

## 2019-01-28 DIAGNOSIS — Z79899 Other long term (current) drug therapy: Secondary | ICD-10-CM | POA: Diagnosis not present

## 2019-01-28 DIAGNOSIS — Z79890 Hormone replacement therapy (postmenopausal): Secondary | ICD-10-CM | POA: Diagnosis not present

## 2019-01-28 HISTORY — DX: Cardiac arrhythmia, unspecified: I49.9

## 2019-01-28 HISTORY — PX: CATARACT EXTRACTION W/PHACO: SHX586

## 2019-01-28 LAB — GLUCOSE, CAPILLARY: Glucose-Capillary: 127 mg/dL — ABNORMAL HIGH (ref 70–99)

## 2019-01-28 SURGERY — PHACOEMULSIFICATION, CATARACT, WITH IOL INSERTION
Anesthesia: Monitor Anesthesia Care | Site: Eye | Laterality: Right

## 2019-01-28 MED ORDER — POVIDONE-IODINE 5 % OP SOLN
OPHTHALMIC | Status: DC | PRN
  Administered 2019-01-28: 1 via OPHTHALMIC

## 2019-01-28 MED ORDER — NA CHONDROIT SULF-NA HYALURON 40-17 MG/ML IO SOLN
INTRAOCULAR | Status: DC | PRN
  Administered 2019-01-28: 1 mL via INTRAOCULAR

## 2019-01-28 MED ORDER — TETRACAINE HCL 0.5 % OP SOLN
OPHTHALMIC | Status: AC
  Administered 2019-01-28: 1 [drp] via OPHTHALMIC
  Filled 2019-01-28: qty 4

## 2019-01-28 MED ORDER — CARBACHOL 0.01 % IO SOLN
INTRAOCULAR | Status: DC | PRN
  Administered 2019-01-28: 0.5 mL via INTRAOCULAR

## 2019-01-28 MED ORDER — SODIUM CHLORIDE 0.9 % IV SOLN
INTRAVENOUS | Status: DC
  Administered 2019-01-28: 11:00:00 via INTRAVENOUS

## 2019-01-28 MED ORDER — ARMC OPHTHALMIC DILATING DROPS
OPHTHALMIC | Status: AC
  Filled 2019-01-28: qty 0.5

## 2019-01-28 MED ORDER — MOXIFLOXACIN HCL 0.5 % OP SOLN: 1.0000 [drp] | OPHTHALMIC | Status: DC | PRN

## 2019-01-28 MED ORDER — MOXIFLOXACIN HCL 0.5 % OP SOLN
OPHTHALMIC | Status: AC
  Filled 2019-01-28: qty 3

## 2019-01-28 MED ORDER — EPINEPHRINE PF 1 MG/ML IJ SOLN
INTRAOCULAR | Status: DC | PRN
  Administered 2019-01-28: 1 mL via OPHTHALMIC

## 2019-01-28 MED ORDER — TETRACAINE HCL 0.5 % OP SOLN
1.0000 [drp] | OPHTHALMIC | Status: AC | PRN
  Administered 2019-01-28 (×2): 1 [drp] via OPHTHALMIC

## 2019-01-28 MED ORDER — ARMC OPHTHALMIC DILATING DROPS
1.0000 "application " | OPHTHALMIC | Status: AC
  Administered 2019-01-28 (×3): 1 via OPHTHALMIC

## 2019-01-28 MED ORDER — LIDOCAINE HCL (PF) 4 % IJ SOLN
INTRAOCULAR | Status: DC | PRN
  Administered 2019-01-28: 4 mL via OPHTHALMIC

## 2019-01-28 MED ORDER — MOXIFLOXACIN HCL 0.5 % OP SOLN
OPHTHALMIC | Status: DC | PRN
  Administered 2019-01-28: 0.2 mL via OPHTHALMIC

## 2019-01-28 MED ORDER — NA HYALUR & NA CHOND-NA HYALUR 0.55-0.5 ML IO KIT
PACK | INTRAOCULAR | Status: DC | PRN
  Administered 2019-01-28: 1 via OPHTHALMIC

## 2019-01-28 MED ORDER — TRYPAN BLUE 0.06 % OP SOLN
OPHTHALMIC | Status: DC | PRN
  Administered 2019-01-28: 0.5 mL via INTRAOCULAR

## 2019-01-28 MED ORDER — MIDAZOLAM HCL 2 MG/2ML IJ SOLN
INTRAMUSCULAR | Status: AC
  Filled 2019-01-28: qty 2

## 2019-01-28 MED ORDER — MIDAZOLAM HCL 2 MG/2ML IJ SOLN
INTRAMUSCULAR | Status: DC | PRN
  Administered 2019-01-28: 1 mg via INTRAVENOUS

## 2019-01-28 SURGICAL SUPPLY — 17 items
DISSECTOR HYDRO NUCLEUS 50X22 (MISCELLANEOUS) ×12 IMPLANT
DRSG TEGADERM 2-3/8X2-3/4 SM (GAUZE/BANDAGES/DRESSINGS) ×3 IMPLANT
GLOVE BIOGEL M 6.5 STRL (GLOVE) ×3 IMPLANT
GOWN STRL REUS W/ TWL LRG LVL3 (GOWN DISPOSABLE) ×1 IMPLANT
GOWN STRL REUS W/ TWL XL LVL3 (GOWN DISPOSABLE) ×1 IMPLANT
GOWN STRL REUS W/TWL LRG LVL3 (GOWN DISPOSABLE) ×2
GOWN STRL REUS W/TWL XL LVL3 (GOWN DISPOSABLE) ×2
KNIFE 45D UP 2.3 (MISCELLANEOUS) ×3 IMPLANT
LABEL CATARACT MEDS ST (LABEL) ×3 IMPLANT
LENS IOL ACRYSOF IQ 22.0 (Intraocular Lens) ×2 IMPLANT
PACK CATARACT (MISCELLANEOUS) ×3 IMPLANT
PACK CATARACT KING (MISCELLANEOUS) ×3 IMPLANT
PACK EYE AFTER SURG (MISCELLANEOUS) ×3 IMPLANT
SOL BSS BAG (MISCELLANEOUS) ×3
SOLUTION BSS BAG (MISCELLANEOUS) ×1 IMPLANT
WATER STERILE IRR 250ML POUR (IV SOLUTION) ×3 IMPLANT
WIPE NON LINTING 3.25X3.25 (MISCELLANEOUS) ×3 IMPLANT

## 2019-01-28 NOTE — H&P (Signed)
   I have reviewed the patient's H&P and agree with its findings. There have been no interval changes.    MD Ophthalmology 

## 2019-01-28 NOTE — Op Note (Signed)
PREOPERATIVE DIAGNOSIS:  Nuclear sclerotic cataract of the RIGHT eye.   POSTOPERATIVE DIAGNOSIS:  Nuclear sclerotic cataract of the RIGHT eye.   OPERATIVE PROCEDURE: Cataract surgery OD   SURGEON:  Marchia Meiers, MD.   ANESTHESIA:  Anesthesiologist: Gunnar Fusi, MD CRNA: Eben Burow, CRNA  1.      Managed anesthesia care. 2.     0.27ml of Shugarcaine was instilled following the paracentesis   COMPLICATIONS:  None.   TECHNIQUE:   Divide and conquer   DESCRIPTION OF PROCEDURE:  The patient was examined and consented in the preoperative holding area where the aforementioned topical anesthesia was applied to the RIGHT eye and then brought back to the Operating Room where the RIGHT eye was prepped and draped in the usual sterile ophthalmic fashion and a lid speculum was placed. A paracentesis was created with the side port blade, the anterior chamber was washed out with trypan blue to stain the anterior capsule, and the anterior chamber was filled with viscoelastic. A near clear corneal incision was performed with the steel keratome. A continuous curvilinear capsulorrhexis was performed with a cystotome followed by the capsulorrhexis forceps. Hydrodissection and hydrodelineation were carried out with BSS on a blunt cannula. The lens was removed in a divide and conquer  technique and the remaining cortical material was removed with the irrigation-aspiration handpiece. The capsular bag was inflated with viscoelastic and the lens was placed in the capsular bag without complication. The remaining viscoelastic was removed from the eye with the irrigation-aspiration handpiece. The wounds were hydrated. The anterior chamber was flushed and the eye was inflated to physiologic pressure. 0.110ml Vigamox was placed in the anterior chamber. The wounds were found to be water tight. The eye was dressed with Vigamox. The patient was given protective glasses to wear throughout the day and a shield with which to  sleep tonight. The patient was also given drops with which to begin a drop regimen today and will follow-up with me in one day. Implant Name Type Inv. Item Serial No. Manufacturer Lot No. LRB No. Used Action  LENS IOL ACRYSOF IQ 22.0 - [id number] Intraocular Lens LENS IOL ACRYSOF IQ 22.0 [id number] ALCON  Right 1 Implanted    Procedure(s) with comments: CATARACT EXTRACTION PHACO AND INTRAOCULAR LENS PLACEMENT (IOC) RIGHT DIABETES (Right) - [place] 00:49.3 CDE 8.12 Fluid Pack Lot # [id number]  Electronically signed: Marchia Meiers 01/28/2019 2:19 PM

## 2019-01-28 NOTE — Transfer of Care (Signed)
Immediate Anesthesia Transfer of Care Note  Patient: Barbara Butler  Procedure(s) Performed: CATARACT EXTRACTION PHACO AND INTRAOCULAR LENS PLACEMENT (IOC) RIGHT DIABETES (Right Eye)  Patient Location: Short Stay  Anesthesia Type:MAC  Level of Consciousness: awake, alert , oriented and patient cooperative  Airway & Oxygen Therapy: Patient Spontanous Breathing  Post-op Assessment: Report given to RN and Post -op Vital signs reviewed and stable  Post vital signs: Reviewed and stable  Last Vitals: see vital sign flow sheet Vitals Value Taken Time  BP    Temp    Pulse    Resp    SpO2      Last Pain:  Vitals:   01/28/19 0952  TempSrc: Temporal  PainSc: 0-No pain         Complications: No apparent anesthesia complications

## 2019-01-28 NOTE — Anesthesia Postprocedure Evaluation (Signed)
Anesthesia Post Note  Patient: Barbara Butler  Procedure(s) Performed: CATARACT EXTRACTION PHACO AND INTRAOCULAR LENS PLACEMENT (IOC) RIGHT DIABETES (Right Eye)  Patient location during evaluation: Short Stay Anesthesia Type: MAC Level of consciousness: awake and alert and oriented Pain management: satisfactory to patient Vital Signs Assessment: post-procedure vital signs reviewed and stable Respiratory status: spontaneous breathing, nonlabored ventilation and respiratory function stable Cardiovascular status: stable Postop Assessment: no headache and no apparent nausea or vomiting Anesthetic complications: no     Last Vitals:  Vitals:   01/28/19 0952 01/28/19 1111  BP: (!) 141/73 (!) 144/78  Pulse: 66 61  Resp: 14 16  Temp: (!) 36.1 C (!) 36.3 C  SpO2: 97% 100%    Last Pain:  Vitals:   01/28/19 1111  TempSrc: Temporal  PainSc: 0-No pain                 Eben Burow

## 2019-01-28 NOTE — Anesthesia Preprocedure Evaluation (Signed)
Anesthesia Evaluation  Patient identified by MRN, date of birth, ID band Patient awake    Reviewed: Allergy & Precautions, NPO status , Patient's Chart, lab work & pertinent test results  History of Anesthesia Complications Negative for: history of anesthetic complications  Airway Mallampati: III       Dental   Pulmonary neg sleep apnea, neg COPD, former smoker,           Cardiovascular hypertension, Pt. on medications (-) Past MI and (-) CHF + dysrhythmias      Neuro/Psych neg Seizures    GI/Hepatic Neg liver ROS, neg GERD  ,  Endo/Other  diabetes, Type 2, Oral Hypoglycemic AgentsHypothyroidism   Renal/GU negative Renal ROS     Musculoskeletal   Abdominal   Peds  Hematology  (+) anemia ,   Anesthesia Other Findings   Reproductive/Obstetrics                             Anesthesia Physical Anesthesia Plan  ASA: III  Anesthesia Plan: MAC   Post-op Pain Management:    Induction:   PONV Risk Score and Plan:   Airway Management Planned: Nasal Cannula  Additional Equipment:   Intra-op Plan:   Post-operative Plan:   Informed Consent: I have reviewed the patients History and Physical, chart, labs and discussed the procedure including the risks, benefits and alternatives for the proposed anesthesia with the patient or authorized representative who has indicated his/her understanding and acceptance.       Plan Discussed with:   Anesthesia Plan Comments:         Anesthesia Quick Evaluation

## 2019-01-28 NOTE — Anesthesia Post-op Follow-up Note (Signed)
Anesthesia QCDR form completed.        

## 2019-01-28 NOTE — Discharge Instructions (Addendum)
Eye Surgery Discharge Instructions  Expect mild scratchy sensation or mild soreness. DO NOT RUB YOUR EYE!  The day of surgery:  Minimal physical activity, but bed rest is not required  No reading, computer work, or close hand work  No bending, lifting, or straining.  May watch TV  For 24 hours:  No driving, legal decisions, or alcoholic beverages  Safety precautions  Eat anything you prefer: It is better to start with liquids, then soup then solid foods.  Solar shield eyeglasses should be worn for comfort in the sunlight/patch while sleeping  Resume all regular medications including aspirin or Coumadin if these were discontinued prior to surgery. You may shower, bathe, shave, or wash your hair. Tylenol may be taken for mild discomfort. Follow eye drop instruction sheet as reviewed.  Call your doctor if you experience significant pain, nausea, or vomiting, fever > 101 or other signs of infection. [phone] or [phone] Specific instructions:  Follow-up Information    Marchia Meiers, MD Follow up.   Specialty: Ophthalmology Why: 01/29/19 @ 1:45 pm Contact information: [place] [address] [phone]

## 2019-01-29 ENCOUNTER — Encounter: Payer: Self-pay | Admitting: Ophthalmology

## 2019-02-22 ENCOUNTER — Other Ambulatory Visit: Payer: Self-pay

## 2019-02-22 ENCOUNTER — Other Ambulatory Visit
Admission: RE | Admit: 2019-02-22 | Discharge: 2019-02-22 | Disposition: A | Discharge status: Home / Self Care | Payer: Medicare Other | Source: Ambulatory Visit | Attending: Ophthalmology | Admitting: Ophthalmology

## 2019-02-22 DIAGNOSIS — Z01812 Encounter for preprocedural laboratory examination: Secondary | ICD-10-CM | POA: Insufficient documentation

## 2019-02-22 DIAGNOSIS — Z20828 Contact with and (suspected) exposure to other viral communicable diseases: Secondary | ICD-10-CM | POA: Diagnosis not present

## 2019-02-22 LAB — SARS CORONAVIRUS 2 (TAT 6-24 HRS): SARS Coronavirus 2: NEGATIVE

## 2019-02-25 ENCOUNTER — Encounter
Admission: RE | Disposition: A | Discharge status: Home / Self Care | Payer: Self-pay | Source: Home / Self Care | Attending: Ophthalmology

## 2019-02-25 ENCOUNTER — Ambulatory Visit
Admission: RE | Admit: 2019-02-25 | Discharge: 2019-02-25 | Disposition: A | Discharge status: Home / Self Care | Payer: Medicare Other | Attending: Ophthalmology | Admitting: Ophthalmology

## 2019-02-25 ENCOUNTER — Encounter: Payer: Self-pay | Admitting: Ophthalmology

## 2019-02-25 ENCOUNTER — Other Ambulatory Visit: Payer: Self-pay

## 2019-02-25 ENCOUNTER — Ambulatory Visit: Payer: Medicare Other | Admitting: Certified Registered Nurse Anesthetist

## 2019-02-25 DIAGNOSIS — Z7982 Long term (current) use of aspirin: Secondary | ICD-10-CM | POA: Diagnosis not present

## 2019-02-25 DIAGNOSIS — H2512 Age-related nuclear cataract, left eye: Secondary | ICD-10-CM | POA: Insufficient documentation

## 2019-02-25 DIAGNOSIS — Z961 Presence of intraocular lens: Secondary | ICD-10-CM | POA: Diagnosis not present

## 2019-02-25 DIAGNOSIS — Z9841 Cataract extraction status, right eye: Secondary | ICD-10-CM | POA: Insufficient documentation

## 2019-02-25 DIAGNOSIS — Z79890 Hormone replacement therapy (postmenopausal): Secondary | ICD-10-CM | POA: Diagnosis not present

## 2019-02-25 DIAGNOSIS — E1140 Type 2 diabetes mellitus with diabetic neuropathy, unspecified: Secondary | ICD-10-CM | POA: Insufficient documentation

## 2019-02-25 DIAGNOSIS — Z87891 Personal history of nicotine dependence: Secondary | ICD-10-CM | POA: Diagnosis not present

## 2019-02-25 DIAGNOSIS — I10 Essential (primary) hypertension: Secondary | ICD-10-CM | POA: Diagnosis not present

## 2019-02-25 DIAGNOSIS — E039 Hypothyroidism, unspecified: Secondary | ICD-10-CM | POA: Insufficient documentation

## 2019-02-25 DIAGNOSIS — Z79899 Other long term (current) drug therapy: Secondary | ICD-10-CM | POA: Diagnosis not present

## 2019-02-25 DIAGNOSIS — E1136 Type 2 diabetes mellitus with diabetic cataract: Secondary | ICD-10-CM | POA: Diagnosis present

## 2019-02-25 HISTORY — PX: CATARACT EXTRACTION W/PHACO: SHX586

## 2019-02-25 LAB — GLUCOSE, CAPILLARY
Glucose-Capillary: 128 mg/dL — ABNORMAL HIGH (ref 70–99)
Glucose-Capillary: 109 mg/dL — ABNORMAL HIGH (ref 70–99)

## 2019-02-25 SURGERY — PHACOEMULSIFICATION, CATARACT, WITH IOL INSERTION
Anesthesia: Monitor Anesthesia Care | Site: Eye | Laterality: Left

## 2019-02-25 MED ORDER — SODIUM CHLORIDE 0.9 % IV SOLN
Freq: Once | INTRAVENOUS | Status: AC
  Administered 2019-02-25: 09:00:00 via INTRAVENOUS

## 2019-02-25 MED ORDER — EPINEPHRINE PF 1 MG/ML IJ SOLN
INTRAOCULAR | Status: DC | PRN
  Administered 2019-02-25: 11:00:00 1 mL via OPHTHALMIC

## 2019-02-25 MED ORDER — TRYPAN BLUE 0.06 % OP SOLN
OPHTHALMIC | Status: AC
  Filled 2019-02-25: qty 0.5

## 2019-02-25 MED ORDER — POVIDONE-IODINE 5 % OP SOLN
OPHTHALMIC | Status: AC
  Filled 2019-02-25: qty 30

## 2019-02-25 MED ORDER — MIDAZOLAM HCL 2 MG/2ML IJ SOLN
INTRAMUSCULAR | Status: AC
  Filled 2019-02-25: qty 2

## 2019-02-25 MED ORDER — NA HYALUR & NA CHOND-NA HYALUR 0.55-0.5 ML IO KIT
PACK | INTRAOCULAR | Status: AC
  Filled 2019-02-25: qty 1.05

## 2019-02-25 MED ORDER — MOXIFLOXACIN HCL 0.5 % OP SOLN
OPHTHALMIC | Status: DC | PRN
  Administered 2019-02-25: .2 mL via OPHTHALMIC

## 2019-02-25 MED ORDER — MOXIFLOXACIN HCL 0.5 % OP SOLN
1.0000 [drp] | Freq: Once | OPHTHALMIC | Status: DC
  Filled 2019-02-25: qty 3

## 2019-02-25 MED ORDER — MIDAZOLAM HCL 2 MG/2ML IJ SOLN
INTRAMUSCULAR | Status: DC | PRN
  Administered 2019-02-25 (×2): 0.5 mg via INTRAVENOUS

## 2019-02-25 MED ORDER — ARMC OPHTHALMIC DILATING DROPS
1.0000 "application " | OPHTHALMIC | Status: AC
  Administered 2019-02-25 (×3): 1 via OPHTHALMIC

## 2019-02-25 MED ORDER — ONDANSETRON HCL 4 MG/2ML IJ SOLN
INTRAMUSCULAR | Status: DC | PRN
  Administered 2019-02-25: 4 mg via INTRAVENOUS

## 2019-02-25 MED ORDER — NA HYALUR & NA CHOND-NA HYALUR 0.55-0.5 ML IO KIT
PACK | INTRAOCULAR | Status: DC | PRN
  Administered 2019-02-25: 1 via OPHTHALMIC

## 2019-02-25 MED ORDER — FENTANYL CITRATE (PF) 100 MCG/2ML IJ SOLN
INTRAMUSCULAR | Status: AC
  Filled 2019-02-25: qty 2

## 2019-02-25 MED ORDER — TETRACAINE HCL 0.5 % OP SOLN
OPHTHALMIC | Status: AC
  Administered 2019-02-25: 09:00:00 1 [drp] via OPHTHALMIC
  Filled 2019-02-25: qty 4

## 2019-02-25 MED ORDER — LIDOCAINE HCL (PF) 4 % IJ SOLN
INTRAOCULAR | Status: DC | PRN
  Administered 2019-02-25: 10:00:00 2.25 mL via OPHTHALMIC

## 2019-02-25 MED ORDER — CARBACHOL 0.01 % IO SOLN
INTRAOCULAR | Status: DC | PRN
  Administered 2019-02-25: .5 mL via INTRAOCULAR

## 2019-02-25 MED ORDER — EPINEPHRINE PF 1 MG/ML IJ SOLN
INTRAMUSCULAR | Status: AC
  Filled 2019-02-25: qty 1

## 2019-02-25 MED ORDER — TRYPAN BLUE 0.06 % OP SOLN
OPHTHALMIC | Status: DC | PRN
  Administered 2019-02-25: .5 mL via INTRAOCULAR

## 2019-02-25 MED ORDER — TETRACAINE HCL 0.5 % OP SOLN
1.0000 [drp] | OPHTHALMIC | Status: DC | PRN
  Administered 2019-02-25: 09:00:00 1 [drp] via OPHTHALMIC

## 2019-02-25 MED ORDER — LIDOCAINE HCL (PF) 4 % IJ SOLN
INTRAMUSCULAR | Status: AC
  Filled 2019-02-25: qty 5

## 2019-02-25 MED ORDER — NA CHONDROIT SULF-NA HYALURON 40-17 MG/ML IO SOLN
INTRAOCULAR | Status: AC
  Filled 2019-02-25: qty 1

## 2019-02-25 MED ORDER — POVIDONE-IODINE 5 % OP SOLN
OPHTHALMIC | Status: DC | PRN
  Administered 2019-02-25: 1 via OPHTHALMIC

## 2019-02-25 MED ORDER — NA CHONDROIT SULF-NA HYALURON 40-17 MG/ML IO SOLN
INTRAOCULAR | Status: DC | PRN
  Administered 2019-02-25: 1 mL via INTRAOCULAR

## 2019-02-25 MED ORDER — ARMC OPHTHALMIC DILATING DROPS
OPHTHALMIC | Status: AC
  Administered 2019-02-25: 1 via OPHTHALMIC
  Filled 2019-02-25: qty 0.5

## 2019-02-25 MED ORDER — SODIUM CHLORIDE 0.9 % IV SOLN
INTRAVENOUS | Status: DC | PRN
  Administered 2019-02-25: 10:00:00 via INTRAVENOUS

## 2019-02-25 SURGICAL SUPPLY — 18 items
DISSECTOR HYDRO NUCLEUS 50X22 (MISCELLANEOUS) ×12 IMPLANT
DRSG TEGADERM 2-3/8X2-3/4 SM (GAUZE/BANDAGES/DRESSINGS) ×3 IMPLANT
GLOVE BIOGEL M 6.5 STRL (GLOVE) ×3 IMPLANT
GOWN STRL REUS W/ TWL LRG LVL3 (GOWN DISPOSABLE) ×1 IMPLANT
GOWN STRL REUS W/ TWL XL LVL3 (GOWN DISPOSABLE) ×1 IMPLANT
GOWN STRL REUS W/TWL LRG LVL3 (GOWN DISPOSABLE) ×2
GOWN STRL REUS W/TWL XL LVL3 (GOWN DISPOSABLE) ×2
KNIFE 45D UP 2.3 (MISCELLANEOUS) ×3 IMPLANT
LABEL CATARACT MEDS ST (LABEL) ×3 IMPLANT
LENS IOL ACRSF IQ ULTRA 23.5 (Intraocular Lens) IMPLANT
LENS IOL ACRYSOF IQ 23.5 (Intraocular Lens) ×3 IMPLANT
PACK CATARACT (MISCELLANEOUS) ×3 IMPLANT
PACK CATARACT KING (MISCELLANEOUS) ×3 IMPLANT
PACK EYE AFTER SURG (MISCELLANEOUS) ×3 IMPLANT
SOL BSS BAG (MISCELLANEOUS) ×3
SOLUTION BSS BAG (MISCELLANEOUS) ×1 IMPLANT
WATER STERILE IRR 250ML POUR (IV SOLUTION) ×3 IMPLANT
WIPE NON LINTING 3.25X3.25 (MISCELLANEOUS) ×3 IMPLANT

## 2019-02-25 NOTE — Anesthesia Preprocedure Evaluation (Addendum)
Anesthesia Evaluation  Patient identified by MRN, date of birth, ID band Patient awake    Reviewed: Allergy & Precautions, H&P , NPO status , reviewed documented beta blocker date and time   Airway Mallampati: III  TM Distance: >3 FB Neck ROM: full    Dental  (+) Partial Upper, Partial Lower, Chipped, Missing   Pulmonary former smoker,    Pulmonary exam normal        Cardiovascular hypertension, Normal cardiovascular exam+ dysrhythmias      Neuro/Psych    GI/Hepatic   Endo/Other  diabetesHypothyroidism   Renal/GU      Musculoskeletal   Abdominal   Peds  Hematology  (+) Blood dyscrasia, anemia ,   Anesthesia Other Findings Past Medical History: No date: Anemia No date: Diabetes mellitus without complication (HCC)     Comment:  diet controlled No date: Diabetic neuropathy ([place]) No date: Dysrhythmia No date: Hypertension No date: Hypothyroidism  Past Surgical History: 01/28/2019: CATARACT EXTRACTION W/PHACO; Right     Comment:  Procedure: CATARACT EXTRACTION PHACO AND INTRAOCULAR               LENS PLACEMENT ([place]) RIGHT DIABETES;  Surgeon: Marchia Meiers, MD;  Location: [facility] ORS;  Service: Ophthalmology;               Laterality: Right;  [place] 00:49.3 CDE 8.12 Fluid Pack Lot               # [id number] No date: FRACTURE SURGERY 06/28/2016: ORIF WRIST FRACTURE; Right     Comment:  Procedure: OPEN REDUCTION INTERNAL FIXATION (ORIF) WRIST              FRACTURE;  Surgeon: Hessie Knows, MD;  Location: [facility]               ORS;  Service: Orthopedics;  Laterality: Right; No date: SALPINGECTOMY  BMI    Body Mass Index: 23.78 kg/m      Reproductive/Obstetrics                             Anesthesia Physical Anesthesia Plan  ASA: III  Anesthesia Plan: MAC   Post-op Pain Management:    Induction:   PONV Risk Score and Plan: Treatment may vary due to age or medical condition  and TIVA  Airway Management Planned: Nasal Cannula and Natural Airway  Additional Equipment:   Intra-op Plan:   Post-operative Plan:   Informed Consent: I have reviewed the patients History and Physical, chart, labs and discussed the procedure including the risks, benefits and alternatives for the proposed anesthesia with the patient or authorized representative who has indicated his/her understanding and acceptance.     Dental Advisory Given  Plan Discussed with: CRNA  Anesthesia Plan Comments:         Anesthesia Quick Evaluation

## 2019-02-25 NOTE — Anesthesia Postprocedure Evaluation (Signed)
Anesthesia Post Note  Patient: Derenda Mis  Procedure(s) Performed: CATARACT EXTRACTION PHACO AND INTRAOCULAR LENS PLACEMENT ([place]), LEFT, DIABETIC, VISION BLUE (Left Eye)  Patient location during evaluation: Phase II Anesthesia Type: MAC Level of consciousness: awake and alert Pain management: pain level controlled Vital Signs Assessment: post-procedure vital signs reviewed and stable Respiratory status: spontaneous breathing, nonlabored ventilation and respiratory function stable Cardiovascular status: blood pressure returned to baseline and stable Postop Assessment: no apparent nausea or vomiting Anesthetic complications: no     Last Vitals:  Vitals:   02/25/19 0853 02/25/19 1050  BP: (!) 132/52 115/69  Pulse: 68 (!) 52  Resp: 14 16  Temp: (!) 36 C 36.6 C  SpO2: 100% 99%    Last Pain:  Vitals:   02/25/19 1050  TempSrc: Temporal  PainSc: 0-No pain                 Alphonsus Sias

## 2019-02-25 NOTE — Anesthesia Post-op Follow-up Note (Signed)
Anesthesia QCDR form completed.        

## 2019-02-25 NOTE — H&P (Signed)
   I have reviewed the patient's H&P and agree with its findings. There have been no interval changes.    MD Ophthalmology 

## 2019-02-25 NOTE — Transfer of Care (Signed)
Immediate Anesthesia Transfer of Care Note  Patient: Barbara Butler  Procedure(s) Performed: CATARACT EXTRACTION PHACO AND INTRAOCULAR LENS PLACEMENT (IOC), LEFT, DIABETIC, VISION BLUE (Left Eye)  Patient Location: PACU  Anesthesia Type:MAC  Level of Consciousness: awake  Airway & Oxygen Therapy: Patient Spontanous Breathing  Post-op Assessment: Report given to RN and Post -op Vital signs reviewed and stable  Post vital signs: Reviewed and stable  Last Vitals:  Vitals Value Taken Time  BP 115/69 02/25/19 1050  Temp 36.6 C 02/25/19 1050  Pulse 52 02/25/19 1050  Resp 16 02/25/19 1050  SpO2 99 % 02/25/19 1050    Last Pain:  Vitals:   02/25/19 1050  TempSrc: Temporal  PainSc: 0-No pain         Complications: No apparent anesthesia complications

## 2019-02-25 NOTE — Op Note (Signed)
  PREOPERATIVE DIAGNOSIS:  Nuclear sclerotic cataract of the LEFT eye.   POSTOPERATIVE DIAGNOSIS:  Nuclear sclerotic cataract of the LEFT eye.   OPERATIVE PROCEDURE: Cataract surgery OS   SURGEON:  Marchia Meiers, MD.   ANESTHESIA:  Anesthesiologist: Alphonsus Sias, MD CRNA: Willette Alma, CRNA  1.      Managed anesthesia care. 2.     0.17ml of Shugarcaine was instilled following the paracentesis   COMPLICATIONS:  None.   TECHNIQUE:   Divide and conquer   DESCRIPTION OF PROCEDURE:  The patient was examined and consented in the preoperative holding area where the aforementioned topical anesthesia was applied to the LEFT eye and then brought back to the Operating Room where the left eye was prepped and draped in the usual sterile ophthalmic fashion and a lid speculum was placed. A paracentesis was created with the side port blade, the anterior chamber was washed out with trypan blue to stain the anterior capsule, and the anterior chamber was filled with viscoelastic. A near clear corneal incision was performed with the steel keratome. A continuous curvilinear capsulorrhexis was performed with a cystotome followed by the capsulorrhexis forceps. Hydrodissection and hydrodelineation were carried out with BSS on a blunt cannula. The lens was removed in a divide and conquer  technique and the remaining cortical material was removed with the irrigation-aspiration handpiece. The capsular bag was inflated with viscoelastic and the lens was placed in the capsular bag without complication. The remaining viscoelastic was removed from the eye with the irrigation-aspiration handpiece. The wounds were hydrated. The anterior chamber was flushed and the eye was inflated to physiologic pressure. 0.3ml Vigamox was placed in the anterior chamber. The wounds were found to be water tight. The eye was dressed with Vigamox. The patient was given protective glasses to wear throughout the day and a shield with which to  sleep tonight. The patient was also given drops with which to begin a drop regimen today and will follow-up with me in one day. Implant Name Type Inv. Item Serial No. Manufacturer Lot No. LRB No. Used Action  LENS IOL ACRYSOF IQ 23.5 - [id number] Intraocular Lens LENS IOL ACRYSOF IQ 23.5 [id number] ALCON  Left 1 Implanted    Procedure(s) with comments: CATARACT EXTRACTION PHACO AND INTRAOCULAR LENS PLACEMENT ([place]), LEFT, DIABETIC, VISION BLUE (Left) - [place]  01:10 CDE 11.43 Fluid pack lot # [id number]  Electronically signed: Marchia Meiers 02/25/2019 12:08 PM

## 2019-02-25 NOTE — Discharge Instructions (Addendum)
Eye Surgery Discharge Instructions  Expect mild scratchy sensation or mild soreness. DO NOT RUB YOUR EYE!  The day of surgery:  Minimal physical activity, but bed rest is not required  No reading, computer work, or close hand work  No bending, lifting, or straining.  May watch TV  For 24 hours:  No driving, legal decisions, or alcoholic beverages  Safety precautions  Eat anything you prefer: It is better to start with liquids, then soup then solid foods.  Solar shield eyeglasses should be worn for comfort in the sunlight/patch while sleeping  Resume all regular medications including aspirin or Coumadin if these were discontinued prior to surgery. You may shower, bathe, shave, or wash your hair. Tylenol may be taken for mild discomfort. FOLLOW EYE DROP INSTRUCTION SHEET AS REVIEWED.  Call your doctor if you experience significant pain, nausea, or vomiting, fever > 101 or other signs of infection. [phone] or [phone] Specific instructions:  Follow-up Information    Marchia Meiers, MD Follow up.   Specialty: Ophthalmology Why: 02-26-19 @ 9:30 am  Contact information: [address] [place] [address] [phone]

## 2019-08-31 DIAGNOSIS — E1165 Type 2 diabetes mellitus with hyperglycemia: Secondary | ICD-10-CM | POA: Diagnosis present

## 2019-12-30 ENCOUNTER — Other Ambulatory Visit: Payer: Self-pay | Admitting: Internal Medicine

## 2019-12-30 DIAGNOSIS — Z1231 Encounter for screening mammogram for malignant neoplasm of breast: Secondary | ICD-10-CM

## 2020-02-07 ENCOUNTER — Ambulatory Visit
Admission: RE | Admit: 2020-02-07 | Discharge: 2020-02-07 | Disposition: A | Discharge status: Home / Self Care | Payer: Medicare PPO | Source: Ambulatory Visit | Attending: Internal Medicine | Admitting: Internal Medicine

## 2020-02-07 ENCOUNTER — Other Ambulatory Visit: Payer: Self-pay

## 2020-02-07 DIAGNOSIS — Z1231 Encounter for screening mammogram for malignant neoplasm of breast: Secondary | ICD-10-CM | POA: Diagnosis not present

## 2021-01-09 ENCOUNTER — Other Ambulatory Visit: Payer: Self-pay | Admitting: Internal Medicine

## 2021-01-09 DIAGNOSIS — Z1231 Encounter for screening mammogram for malignant neoplasm of breast: Secondary | ICD-10-CM

## 2021-02-12 ENCOUNTER — Other Ambulatory Visit: Payer: Self-pay

## 2021-02-12 ENCOUNTER — Ambulatory Visit
Admission: RE | Admit: 2021-02-12 | Discharge: 2021-02-12 | Disposition: A | Discharge status: Home / Self Care | Payer: Medicare PPO | Source: Ambulatory Visit | Attending: Internal Medicine | Admitting: Internal Medicine

## 2021-02-12 DIAGNOSIS — Z1231 Encounter for screening mammogram for malignant neoplasm of breast: Secondary | ICD-10-CM | POA: Diagnosis present

## 2022-01-28 ENCOUNTER — Other Ambulatory Visit: Payer: Self-pay | Admitting: Internal Medicine

## 2022-01-28 DIAGNOSIS — Z1231 Encounter for screening mammogram for malignant neoplasm of breast: Secondary | ICD-10-CM

## 2022-03-05 ENCOUNTER — Ambulatory Visit
Admission: RE | Admit: 2022-03-05 | Discharge: 2022-03-05 | Disposition: A | Discharge status: Home / Self Care | Payer: Medicare PPO | Source: Ambulatory Visit | Attending: Internal Medicine | Admitting: Internal Medicine

## 2022-03-05 DIAGNOSIS — Z1231 Encounter for screening mammogram for malignant neoplasm of breast: Secondary | ICD-10-CM | POA: Insufficient documentation

## 2022-10-25 IMAGING — MG MM DIGITAL SCREENING BILAT W/ TOMO AND CAD
6 of 12 series · 6 of 36 positions shown · non-contrast
Comparison: Previous exam(s).

CLINICAL DATA: Screening.

EXAM:
DIGITAL SCREENING BILATERAL MAMMOGRAM WITH TOMOSYNTHESIS AND CAD
TECHNIQUE: Bilateral screening digital craniocaudal and mediolateral oblique
mammograms were obtained. Bilateral screening digital breast
tomosynthesis was performed. The images were evaluated with
computer-aided detection.

[R MLO synth-2D (1 of 2)]
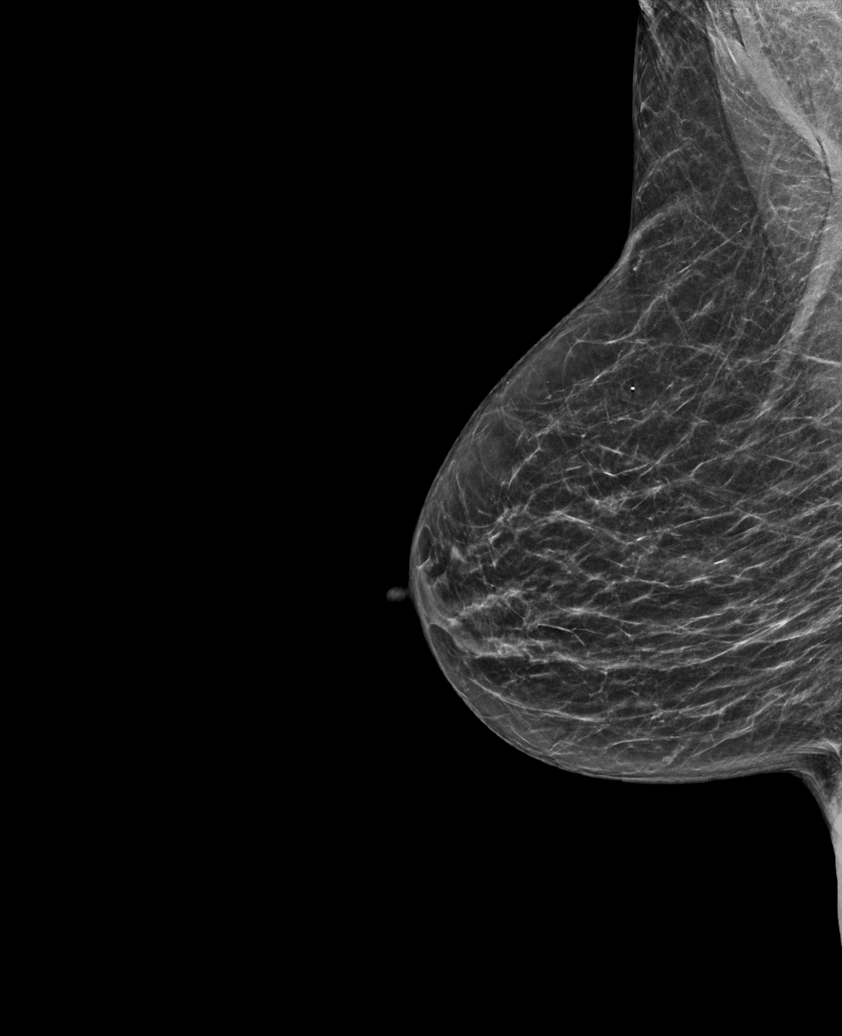

[L CC synth-2D]
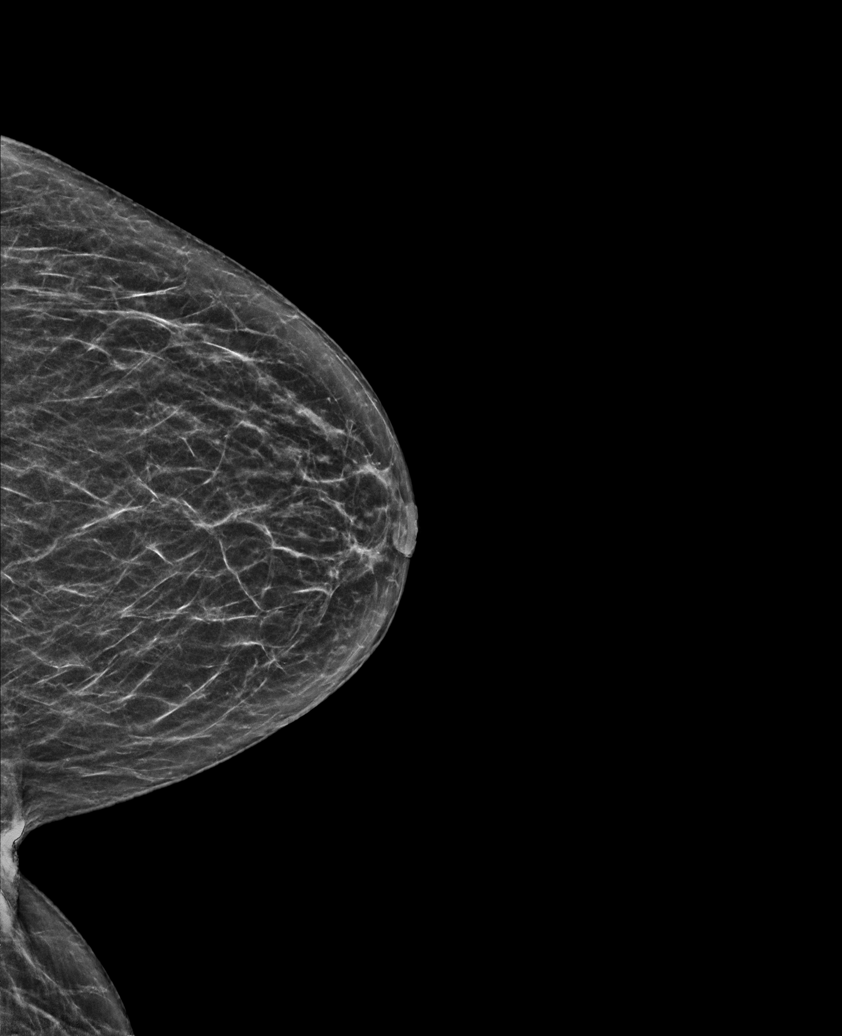

[L MLO synth-2D (1 of 2)]
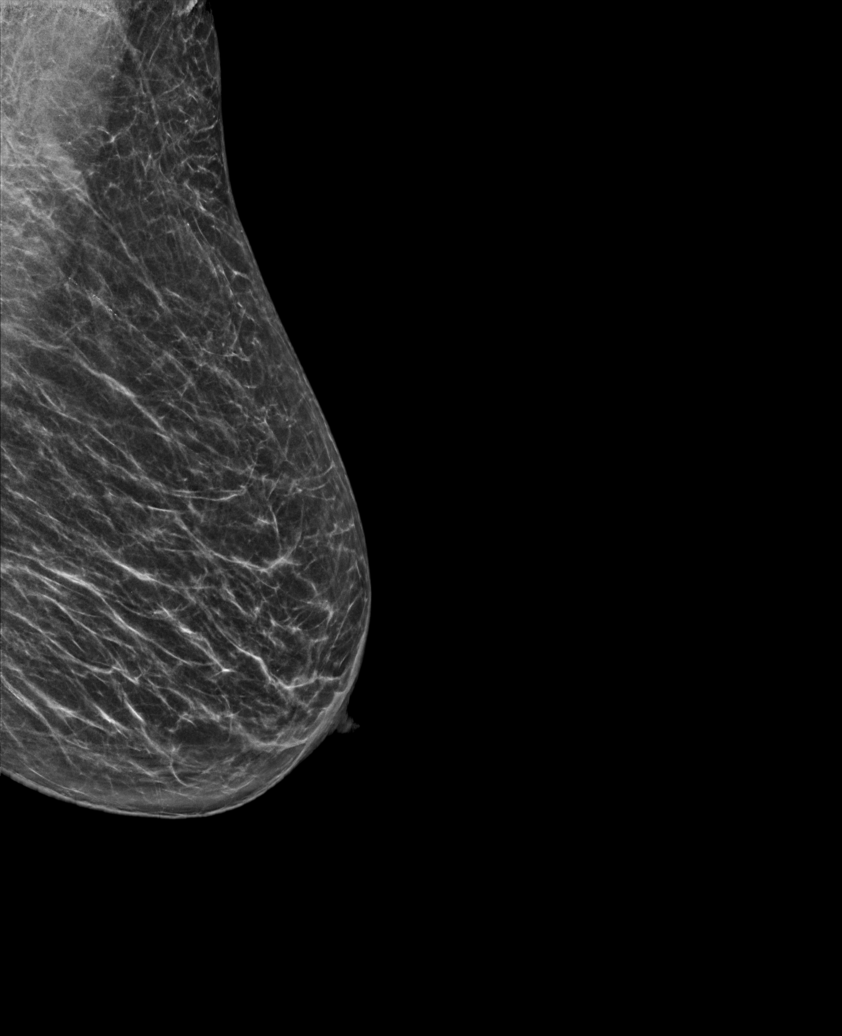

[R CC synth-2D]
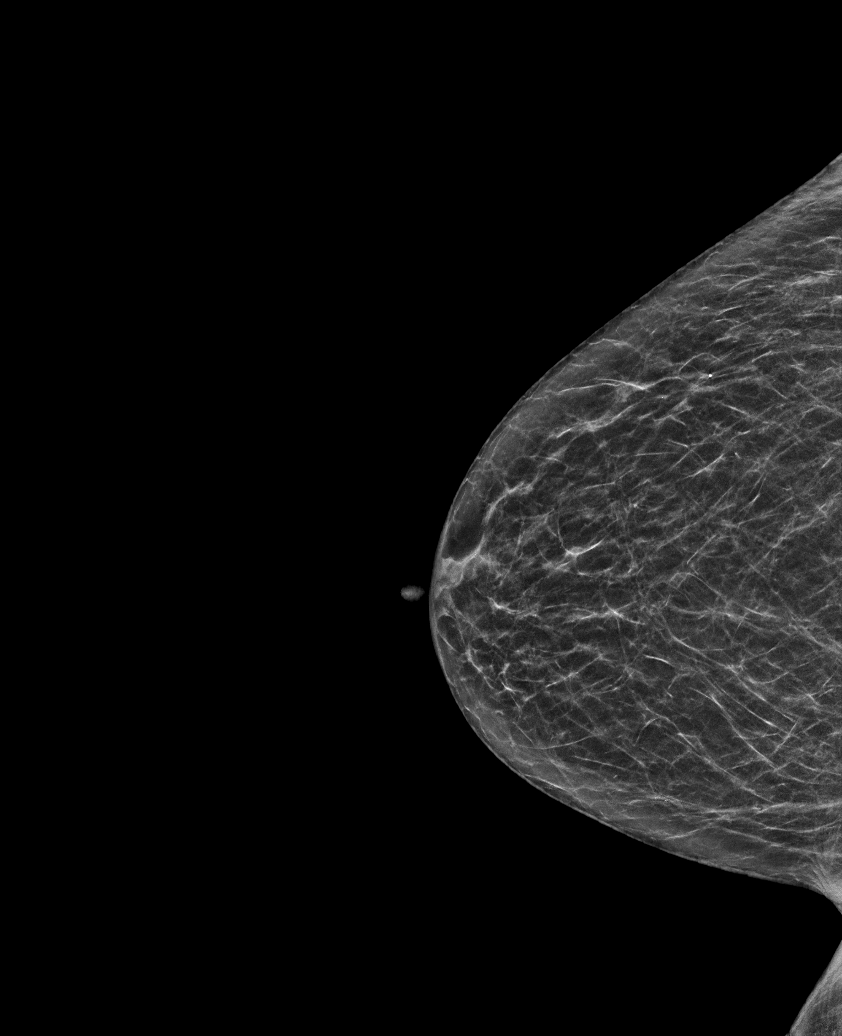

[L MLO synth-2D (2 of 2)]
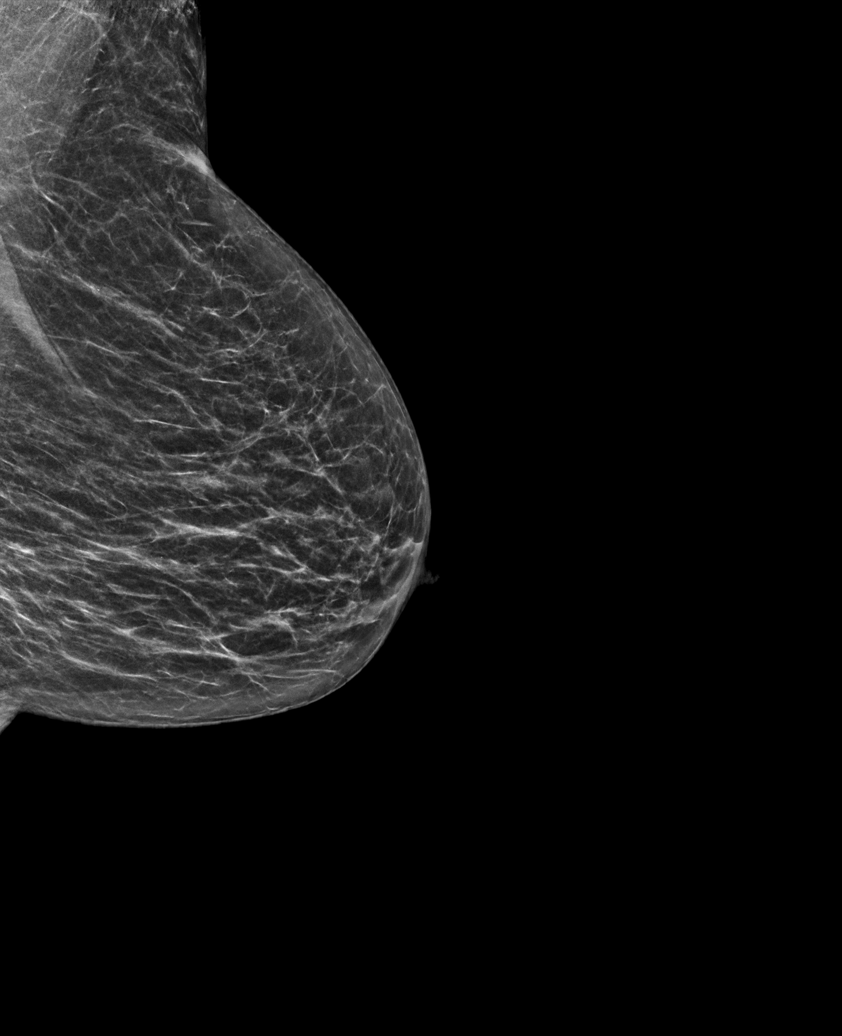

[R MLO synth-2D (2 of 2)]
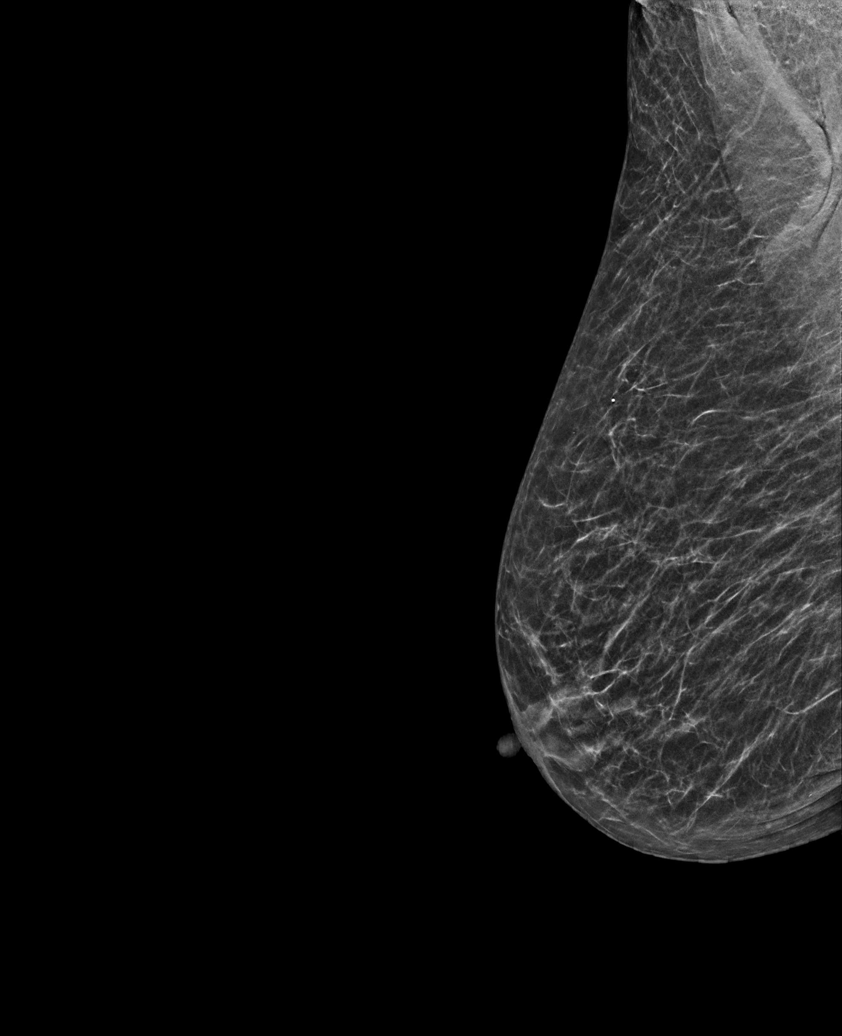

[6 of 36 positions shown; findings below may reference images not displayed]

ACR Breast Density Category b: There are scattered areas of
fibroglandular density.
FINDINGS: There are no findings suspicious for malignancy.
IMPRESSION: No mammographic evidence of malignancy. A result letter of this
screening mammogram will be mailed directly to the patient.

RECOMMENDATION:
Screening mammogram in one year. (Code:51-O-LD2)

BI-RADS CATEGORY  1: Negative.

## 2023-01-06 DIAGNOSIS — I639 Cerebral infarction, unspecified: Secondary | ICD-10-CM

## 2023-01-06 HISTORY — DX: Cerebral infarction, unspecified: I63.9

## 2023-01-26 ENCOUNTER — Other Ambulatory Visit: Payer: Self-pay

## 2023-01-26 ENCOUNTER — Emergency Department: Payer: Medicare PPO

## 2023-01-26 ENCOUNTER — Observation Stay
Admission: EM | Admit: 2023-01-26 | Discharge: 2023-01-29 | Disposition: A | Discharge status: Home / Self Care | Payer: Medicare PPO | Attending: Internal Medicine | Admitting: Internal Medicine

## 2023-01-26 DIAGNOSIS — R2689 Other abnormalities of gait and mobility: Secondary | ICD-10-CM | POA: Diagnosis not present

## 2023-01-26 DIAGNOSIS — M6281 Muscle weakness (generalized): Secondary | ICD-10-CM | POA: Diagnosis not present

## 2023-01-26 DIAGNOSIS — R410 Disorientation, unspecified: Secondary | ICD-10-CM | POA: Diagnosis not present

## 2023-01-26 DIAGNOSIS — Z1152 Encounter for screening for COVID-19: Secondary | ICD-10-CM | POA: Insufficient documentation

## 2023-01-26 DIAGNOSIS — E039 Hypothyroidism, unspecified: Secondary | ICD-10-CM | POA: Insufficient documentation

## 2023-01-26 DIAGNOSIS — Z79899 Other long term (current) drug therapy: Secondary | ICD-10-CM | POA: Insufficient documentation

## 2023-01-26 DIAGNOSIS — R2681 Unsteadiness on feet: Secondary | ICD-10-CM | POA: Insufficient documentation

## 2023-01-26 DIAGNOSIS — R4182 Altered mental status, unspecified: Secondary | ICD-10-CM | POA: Diagnosis present

## 2023-01-26 DIAGNOSIS — I10 Essential (primary) hypertension: Secondary | ICD-10-CM | POA: Diagnosis not present

## 2023-01-26 DIAGNOSIS — I639 Cerebral infarction, unspecified: Principal | ICD-10-CM | POA: Insufficient documentation

## 2023-01-26 DIAGNOSIS — Z7982 Long term (current) use of aspirin: Secondary | ICD-10-CM | POA: Insufficient documentation

## 2023-01-26 DIAGNOSIS — R41841 Cognitive communication deficit: Secondary | ICD-10-CM | POA: Insufficient documentation

## 2023-01-26 DIAGNOSIS — I4891 Unspecified atrial fibrillation: Secondary | ICD-10-CM

## 2023-01-26 DIAGNOSIS — I4892 Unspecified atrial flutter: Secondary | ICD-10-CM

## 2023-01-26 DIAGNOSIS — Z87891 Personal history of nicotine dependence: Secondary | ICD-10-CM | POA: Diagnosis not present

## 2023-01-26 DIAGNOSIS — E1165 Type 2 diabetes mellitus with hyperglycemia: Secondary | ICD-10-CM | POA: Insufficient documentation

## 2023-01-26 LAB — RESP PANEL BY RT-PCR (FLU A&B, COVID) ARPGX2
Influenza A by PCR: NEGATIVE
Influenza B by PCR: NEGATIVE
SARS Coronavirus 2 by RT PCR: NEGATIVE

## 2023-01-26 LAB — CBC WITH DIFFERENTIAL/PLATELET
Abs Immature Granulocytes: 0.02 10*3/uL (ref 0.00–0.07)
Basophils Absolute: 0.1 10*3/uL (ref 0.0–0.1)
Basophils Relative: 1 %
Eosinophils Absolute: 0 10*3/uL (ref 0.0–0.5)
Eosinophils Relative: 1 %
HCT: 44.1 % (ref 36.0–46.0)
Hemoglobin: 14.4 g/dL (ref 12.0–15.0)
Immature Granulocytes: 0 %
Lymphocytes Relative: 22 %
Lymphs Abs: 1.2 10*3/uL (ref 0.7–4.0)
MCH: 27.9 pg (ref 26.0–34.0)
MCHC: 32.7 g/dL (ref 30.0–36.0)
MCV: 85.5 fL (ref 80.0–100.0)
Monocytes Absolute: 0.3 10*3/uL (ref 0.1–1.0)
Monocytes Relative: 6 %
Neutro Abs: 3.9 10*3/uL (ref 1.7–7.7)
Neutrophils Relative %: 70 %
Platelets: 240 10*3/uL (ref 150–400)
RBC: 5.16 MIL/uL — ABNORMAL HIGH (ref 3.87–5.11)
RDW: 15 % (ref 11.5–15.5)
WBC: 5.5 10*3/uL (ref 4.0–10.5)
nRBC: 0 % (ref 0.0–0.2)

## 2023-01-26 LAB — COMPREHENSIVE METABOLIC PANEL
ALT: 41 U/L (ref 0–44)
AST: 41 U/L (ref 15–41)
Albumin: 4.3 g/dL (ref 3.5–5.0)
Alkaline Phosphatase: 67 U/L (ref 38–126)
Anion gap: 8 (ref 5–15)
BUN: 20 mg/dL (ref 8–23)
CO2: 23 mmol/L (ref 22–32)
Calcium: 9.2 mg/dL (ref 8.9–10.3)
Chloride: 103 mmol/L (ref 98–111)
Creatinine, Ser: 0.98 mg/dL (ref 0.44–1.00)
GFR, Estimated: 54 mL/min — ABNORMAL LOW (ref >60)
Glucose, Bld: 193 mg/dL — ABNORMAL HIGH (ref 70–99)
Potassium: 4.5 mmol/L (ref 3.5–5.1)
Sodium: 134 mmol/L — ABNORMAL LOW (ref 135–145)
Total Bilirubin: 1.4 mg/dL — ABNORMAL HIGH (ref 0.3–1.2)
Total Protein: 7.4 g/dL (ref 6.5–8.1)

## 2023-01-26 LAB — TSH: TSH: 3.372 u[IU]/mL (ref 0.350–4.500)

## 2023-01-26 LAB — URINALYSIS, ROUTINE W REFLEX MICROSCOPIC
Bacteria, UA: NONE SEEN
Bilirubin Urine: NEGATIVE
Glucose, UA: NEGATIVE mg/dL
Hgb urine dipstick: NEGATIVE
Ketones, ur: NEGATIVE mg/dL
Leukocytes,Ua: NEGATIVE
Nitrite: NEGATIVE
Protein, ur: 30 mg/dL — AB
Specific Gravity, Urine: 1.011 (ref 1.005–1.030)
pH: 7 (ref 5.0–8.0)

## 2023-01-26 LAB — CBG MONITORING, ED: Glucose-Capillary: 184 mg/dL — ABNORMAL HIGH (ref 70–99)

## 2023-01-26 LAB — PROTIME-INR
INR: 1.3 — ABNORMAL HIGH (ref 0.8–1.2)
Prothrombin Time: 15.9 seconds — ABNORMAL HIGH (ref 11.4–15.2)

## 2023-01-26 MED ORDER — CLOPIDOGREL BISULFATE 75 MG PO TABS
ORAL_TABLET | ORAL | Status: AC
  Filled 2023-01-26: qty 1

## 2023-01-26 MED ORDER — IRBESARTAN 150 MG PO TABS
300.0000 mg | ORAL_TABLET | Freq: Every day | ORAL | Status: DC
  Administered 2023-01-26: 300 mg via ORAL
  Filled 2023-01-26: qty 2

## 2023-01-26 MED ORDER — HYDRALAZINE HCL 50 MG PO TABS
50.0000 mg | ORAL_TABLET | Freq: Once | ORAL | Status: AC
  Administered 2023-01-26: 50 mg via ORAL
  Filled 2023-01-26: qty 1

## 2023-01-26 MED ORDER — AMLODIPINE BESYLATE 5 MG PO TABS: 5.0000 mg | ORAL_TABLET | Freq: Once | ORAL | Status: AC

## 2023-01-26 MED ORDER — CLOPIDOGREL BISULFATE 75 MG PO TABS
75.0000 mg | ORAL_TABLET | Freq: Once | ORAL | Status: AC
  Administered 2023-01-26: 75 mg via ORAL

## 2023-01-26 MED ORDER — AMLODIPINE BESYLATE 5 MG PO TABS
ORAL_TABLET | ORAL | Status: AC
  Administered 2023-01-26: 5 mg via ORAL
  Filled 2023-01-26: qty 1

## 2023-01-26 NOTE — ED Triage Notes (Signed)
[facility] to ed from church via [facility] for new onset of confusion. Last known well unknown. Pt daughter at bedside. Pt was at church at 45 and her church family noticed she was very confused. Pt has no HX of dementia. Lives at home byherself and this is a new sudden onset of change. Pt can tell me who she is but not who her daughter in law is.

## 2023-01-26 NOTE — H&P (Incomplete)
History and Physical    Patient: Barbara Butler QIO:[id number] DOB: August 21, 1929 DOA: 01/26/2023 DOS: the patient was seen and examined on 01/27/2023 PCP: [name], MD  Patient coming from: Home   Chief Complaint:  Chief Complaint  Patient presents with   Altered Mental Status    HPI: Barbara Butler is a 89 y.o. female with medical history significant for hypertension, diabetes mellitus type 2, hypothyroidism, anemia presenting with disorientation that was intermittent.  Last known normal was yesterday at about 7 AM.  Patient went discharged today where friends and neighbors noticed that she was not herself and she was confused.  Patient is not able to describe it well she is a limited historian due to age.  No focal deficits noted in the emergency room.  Will check patient's thyroid function and B12 levels to start with.  The emergency room patient is alert awake oriented to location and assessment and patient is negative for acute intracranial abnormality or large vessel occlusion. Initial vitals show hypertension respirations of 18.  Respiratory panel negative for flu COVID and RSV. Blood work shows hyponatremia of 134 glucose 193 total bili 1.4 EGFR of 54.  Normal kidney and liver function. Hemoglobin of fourteen 4.4 and RBC of 5.16. Pt is alert and cooperative but nonverbal .  Review of Systems: Review of Systems  Unable to perform ROS: Patient nonverbal   Past Medical History:  Diagnosis Date   Anemia    Diabetes mellitus without complication (HCC)    diet controlled   Diabetic neuropathy (HCC)    Dysrhythmia    Hypertension    Hypothyroidism    Past Surgical History:  Procedure Laterality Date   CATARACT EXTRACTION W/PHACO Right 01/28/2019   Procedure: CATARACT EXTRACTION PHACO AND INTRAOCULAR LENS PLACEMENT (IOC) RIGHT DIABETES;  Surgeon: Elliot Cousin, MD;  Location: [facility] ORS;  Service: Ophthalmology;  Laterality: Right;  [place] 00:49.3 CDE 8.12 Fluid Pack Lot  # [id number]   CATARACT EXTRACTION W/PHACO Left 02/25/2019   Procedure: CATARACT EXTRACTION PHACO AND INTRAOCULAR LENS PLACEMENT (IOC), LEFT, DIABETIC, VISION BLUE;  Surgeon: Elliot Cousin, MD;  Location: [facility] ORS;  Service: Ophthalmology;  Laterality: Left;  [place]  01:10 CDE 11.43 Fluid pack lot # [id number]   FRACTURE SURGERY     ORIF WRIST FRACTURE Right 06/28/2016   Procedure: OPEN REDUCTION INTERNAL FIXATION (ORIF) WRIST FRACTURE;  Surgeon: Kennedy Bucker, MD;  Location: [facility] ORS;  Service: Orthopedics;  Laterality: Right;   SALPINGECTOMY     Social History:  reports that she quit smoking about 64 years ago. She has never used smokeless tobacco. She reports that she does not currently use alcohol. She reports that she does not use drugs.  No Known Allergies  Family History  Problem Relation Age of Onset   Breast cancer Neg Hx     Prior to Admission medications   Medication Sig Start Date End Date Taking? Authorizing Provider  amLODipine (NORVASC) 5 MG tablet Take 5 mg by mouth daily.    [provider]  Ascorbic Acid (VITAMIN C) 1000 MG tablet Take 1,000 mg by mouth daily at 12 noon.    [provider]  aspirin EC 81 MG tablet Take 81 mg by mouth daily at 12 noon.    [provider]  cholecalciferol (VITAMIN D3) 25 MCG (1000 UT) tablet Take 1,000 Units by mouth daily at 12 noon.    [provider]  hydrALAZINE (APRESOLINE) 50 MG tablet Take 50 mg by mouth 2 (two) times  a day.    [provider]  levothyroxine (SYNTHROID, LEVOTHROID) 88 MCG tablet Take 88 mcg by mouth daily.  12/05/15   [provider]  MEGARED OMEGA-3 KRILL OIL PO Take 750 mg by mouth daily at 12 noon.    [provider]  olmesartan (BENICAR) 40 MG tablet Take 40 mg by mouth daily.    [provider]  prednisoLONE acetate (PRED FORTE) 1 % ophthalmic suspension Place 1 drop into the right eye 3 (three) times daily.    [provider]      Vitals:   01/27/23 0000 01/27/23 0030 01/27/23 0100 01/27/23 0130  BP: (!) 147/106 (!) 151/106 (!) 151/99 (!) 142/96  Pulse: 86 87 79 97  Resp: 17 14 (!) 24 (!) 21  Temp:      TempSrc:      SpO2: 100% 96% 99% 100%  Height:       Physical Exam Vitals and nursing note reviewed.  Constitutional:      General: She is not in acute distress. HENT:     Head: Normocephalic and atraumatic.     Right Ear: Hearing and external ear normal.     Left Ear: Hearing and external ear normal.     Nose: Nose normal. No nasal deformity.     Mouth/Throat:     Lips: Pink.     Tongue: No lesions.     Pharynx: Oropharynx is clear.  Eyes:     General: Lids are normal.     Extraocular Movements: Extraocular movements intact.     Pupils: Pupils are equal, round, and reactive to light.  Cardiovascular:     Rate and Rhythm: Normal rate. Rhythm irregular.     Heart sounds: Normal heart sounds.  Pulmonary:     Effort: Pulmonary effort is normal.     Breath sounds: Normal breath sounds.  Abdominal:     General: Bowel sounds are normal. There is no distension.     Palpations: Abdomen is soft. There is no mass.     Tenderness: There is no abdominal tenderness.  Musculoskeletal:     Right lower leg: No edema.     Left lower leg: No edema.  Skin:    General: Skin is warm.  Neurological:     General: No focal deficit present.     Mental Status: She is alert. She is disoriented.     Cranial Nerves: Cranial nerves 2-12 are intact.  Psychiatric:        Attention and Perception: Attention normal.        Mood and Affect: Mood normal.        Speech: Speech normal.        Behavior: Behavior normal. Behavior is cooperative.      Labs on Admission: I have personally reviewed following labs and imaging studies  CBC: Recent Labs  Lab 01/26/23 1627  WBC 5.5  NEUTROABS 3.9  HGB 14.4  HCT 44.1  MCV 85.5  PLT 240   Basic Metabolic Panel: Recent Labs  Lab 01/26/23 1627  NA 134*  K 4.5  CL  103  CO2 23  GLUCOSE 193*  BUN 20  CREATININE 0.98  CALCIUM 9.2   GFR: CrCl cannot be calculated (Unknown ideal weight.). Liver Function Tests: Recent Labs  Lab 01/26/23 1627  AST 41  ALT 41  ALKPHOS 67  BILITOT 1.4*  PROT 7.4  ALBUMIN 4.3   No results for input(s): "LIPASE", "AMYLASE" in the last 168 hours. No  results for input(s): "AMMONIA" in the last 168 hours. Coagulation Profile: Recent Labs  Lab 01/26/23 1627  INR 1.3*   Cardiac Enzymes: No results for input(s): "CKTOTAL", "CKMB", "CKMBINDEX", "TROPONINI" in the last 168 hours. BNP (last 3 results) No results for input(s): "PROBNP" in the last 8760 hours. HbA1C: No results for input(s): "HGBA1C" in the last 72 hours. CBG: Recent Labs  Lab 01/26/23 1623  GLUCAP 184*   Lipid Profile: No results for input(s): "CHOL", "HDL", "LDLCALC", "TRIG", "CHOLHDL", "LDLDIRECT" in the last 72 hours. Thyroid Function Tests: Recent Labs    01/26/23 1627  TSH 3.372   Anemia Panel: No results for input(s): "VITAMINB12", "FOLATE", "FERRITIN", "TIBC", "IRON", "RETICCTPCT" in the last 72 hours. Urine analysis: Urinalysis    Component Value Date/Time   COLORURINE YELLOW (A) 01/26/2023 1756   APPEARANCEUR CLEAR (A) 01/26/2023 1756   LABSPEC 1.011 01/26/2023 1756   PHURINE 7.0 01/26/2023 1756   GLUCOSEU NEGATIVE 01/26/2023 1756   HGBUR NEGATIVE 01/26/2023 1756   BILIRUBINUR NEGATIVE 01/26/2023 1756   KETONESUR NEGATIVE 01/26/2023 1756   PROTEINUR 30 (A) 01/26/2023 1756   NITRITE NEGATIVE 01/26/2023 1756   LEUKOCYTESUR NEGATIVE 01/26/2023 1756    Unresulted Labs (From admission, onward)     Start     Ordered   01/27/23 0500  Lipid panel  (Labs)  Tomorrow morning,   R       Comments: Fasting    01/27/23 0011   01/27/23 0012  CBC  (enoxaparin (LOVENOX)    CrCl >/= 30 ml/min)  Once,   R       Comments: Baseline for enoxaparin therapy IF NOT ALREADY DRAWN.  Notify MD if PLT < 100 K.    01/27/23 0011   01/27/23  0012  Hemoglobin A1c  (Labs)  Once,   R       Comments: To assess prior glycemic control    01/27/23 0011             Radiological Exams on Admission: MR Brain Wo Contrast (neuro protocol)  Result Date: 01/26/2023 CLINICAL DATA:  Initial evaluation for mental status change, unknown cause. EXAM: MRI HEAD WITHOUT CONTRAST TECHNIQUE: Multiplanar, multiecho pulse sequences of the brain and surrounding structures were obtained without intravenous contrast. COMPARISON:  Prior CT from earlier the same day. FINDINGS: Brain: Examination mildly degraded by motion artifact. Diffuse prominence of the CSF containing spaces compatible generalized age-related cerebral atrophy. Patchy and confluent T2/FLAIR hyperintensity involving the periventricular and deep white matter both cerebral hemispheres as well as the pons, consistent with chronic small vessel ischemic disease, moderate to advanced in nature. Few small remote infarcts noted about the right greater than left cerebellum. Small remote lacunar infarct noted within the left pons. Patchy small volume diffusion signal abnormality is seen involving the cortical gray matter of the left occipital lobe (series 13, image 25). Additional patchy small volume diffusion signal abnormality noted at the contralateral right occipital lobe (series 13, image 30). Mild patchy involvement of the right posterior splenium (series 13, image 26). Findings consistent with small acute to subacute ischemic infarcts. Associated mild petechial blood products noted on SWI sequence (series 17, images 33, 38). No significant mass effect. No other evidence for acute or subacute ischemia. No other acute intracranial hemorrhage. Few additional punctate chronic micro hemorrhages noted elsewhere, likely small vessel related. No mass lesion, midline shift or mass effect. Mild ventricular prominence related to global parenchymal volume loss of hydrocephalus. No extra-axial fluid collection.  Pituitary gland and suprasellar  region within normal limits. Vascular: Major intracranial vascular flow voids are maintained. Skull and upper cervical spine: Craniocervical junction with normal limits. Bone marrow signal intensity normal. No scalp soft tissue abnormality. Sinuses/Orbits: Prior bilateral ocular lens replacement. Paranasal sinuses are largely clear. No significant mastoid effusion. Other: None. IMPRESSION: 1. Patchy small volume acute to early subacute ischemic infarcts involving the left greater than right occipital lobes and right posterior splenium. Associated mild petechial blood products without frank hemorrhagic transformation or significant mass effect. 2. Underlying age-related cerebral atrophy with moderate to advanced chronic microvascular ischemic disease, with a few small remote infarcts involving the right greater than left cerebellum and left pons. Electronically Signed   By: Rise Mu M.D.   On: 01/26/2023 21:44   DG Chest Portable 1 View  Result Date: 01/26/2023 CLINICAL DATA:  Altered mental status. EXAM: PORTABLE CHEST 1 VIEW COMPARISON:  None Available. FINDINGS: 1644 hours. The heart size is at the upper limits of normal for AP technique. There is aortic atherosclerosis and mitral annular calcification. Mild interstitial prominence throughout the lungs without definite edema. No focal airspace disease, pleural effusion or pneumothorax. No acute osseous findings are evident. IMPRESSION: Borderline heart size with mild interstitial prominence. No definite edema or focal airspace disease. Electronically Signed   By: Carey Bullocks M.D.   On: 01/26/2023 17:30   CT Head Wo Contrast  Result Date: 01/26/2023 CLINICAL DATA:  Delirium EXAM: CT HEAD WITHOUT CONTRAST TECHNIQUE: Contiguous axial images were obtained from the base of the skull through the vertex without intravenous contrast. RADIATION DOSE REDUCTION: This exam was performed according to the departmental  dose-optimization program which includes automated exposure control, adjustment of the mA and/or kV according to patient size and/or use of iterative reconstruction technique. COMPARISON:  None Available. FINDINGS: Brain: No evidence of acute infarction, hemorrhage, hydrocephalus, extra-axial collection or mass lesion/mass effect. There is moderate diffuse atrophy. There is moderate periventricular and deep white matter hypodensity, likely chronic small vessel ischemic change. Vascular: Atherosclerotic calcifications are present within the cavernous internal carotid arteries. Skull: Normal. Negative for fracture or focal lesion. Sinuses/Orbits: No acute finding. Other: None. IMPRESSION: 1. No acute intracranial process. 2. Moderate atrophy and chronic small vessel ischemic changes. Electronically Signed   By: Darliss Cheney M.D.   On: 01/26/2023 17:23     Data Reviewed: Relevant notes from primary care and specialist visits, past discharge summaries as available in EHR, including Care Everywhere. Prior diagnostic testing as pertinent to current admission diagnoses Updated medications and problem lists for reconciliation ED course, including vitals, labs, imaging, treatment and response to treatment Triage notes, nursing and pharmacy notes and ED provider's notes Notable results as noted in HPI Assessment and Plan: * Confusion and disorientation / subacute cva. MRI shows BL subacute infarcts whish I suspect are new.  Pt is outside TPA window. And recommended inexpedient workup go confusion. Per neuro recommendation: CTA head/neck with contrast OR MRA head/neck w/o contrast Please order TTE   Laboratory Studies : Lipid panel Please order Hemoglobin A1c Please order  Antithrombotic Medication : Bolus with Clopidogrel 300 mg bolus x1 and initiate dual antiplatelet therapy with Aspirin 81 mg daily and Clopidogrel 75 mg daily Please order Permissive hypertension, Antihypertensives with prn for  first 24-48 hrs post stroke onset. If BP greater than 220/120 give Labetalol IV or Vasotec IV Please order Statins for LDL goal less than 70 Please order Can switch from antiplatelet medications to anticoagulation such as Eliquis at day 4 if patient  remains stable Nursing Recommendations : IV Fluids, avoid dextrose containing fluids, Maintain euglycemia Neuro checks q4 hrs x 24 hrs and then per shift Head of bed 30 degrees Continue with Telemetry Plz note pt was give 75 Plavix in ed so we have continued same without giving the additional difference.   Hypothyroidism Continue levothyroxine at 88 mcg.  Hypertension Vitals:   01/26/23 2000 01/26/23 2030 01/26/23 2135 01/26/23 2200  BP: (!) 190/120  (!) 178/96 (!) 170/92  Pulse: 91 (!) 56 96 87  Temp:      Resp: (!) 22 (!) 22 (!) 24 18  Height:      SpO2: 99% 100% 100% 99%  TempSrc:      BP meds held for permissive HTN.    Type 2 diabetes mellitus with hyperglycemia, without long-term current use of insulin (HCC) Glycemic protocol. Accu-Cheks every 4 hourly.    DVT prophylaxis:  scd'd  Consults:  Neurology.   Advance Care Planning:    Code Status: Full Code  Family Communication:  None.   Disposition Plan:  Back to previous home environment  Severity of Illness: The appropriate patient status for this patient is INPATIENT. Inpatient status is judged to be reasonable and necessary in order to provide the required intensity of service to ensure the patient's safety. The patient's presenting symptoms, physical exam findings, and initial radiographic and laboratory data in the context of their chronic comorbidities is felt to place them at high risk for further clinical deterioration. Furthermore, it is not anticipated that the patient will be medically stable for discharge from the hospital within 2 midnights of admission.   * I certify that at the point of admission it is my clinical judgment that the patient will  require inpatient hospital care spanning beyond 2 midnights from the point of admission due to high intensity of service, high risk for further deterioration and high frequency of surveillance required.*  Author: Gertha Calkin, MD 01/27/2023 1:57 AM  For on call review [email or web address].

## 2023-01-26 NOTE — ED Notes (Signed)
Called [facility] for Stat Neuro consult

## 2023-01-26 NOTE — Assessment & Plan Note (Signed)
Glycemic protocol. Accu-Cheks every 4 hourly.

## 2023-01-26 NOTE — Consult Note (Signed)
[facility] Tele[facility]urology Consult Services  Stat Consult  Patient Name:   Barbara Butler, Barbara Butler Date of Birth:   [birth year] Identification Number:   MRN - [id number] Date of Service:   01/26/2023 22:07:41  Diagnosis:       I63.89 - Cerebrovascular accident (CVA) due to other mechanism [facility])  Impression 89yo woman w/PMH of HTN, DM, HLD, CAD p/w AMS on 01/26/23. She reportedly developed confusion while at church. Her friend brought her to church initially and she was already acting abnormally. At church, she wasn't answering question appropriately and was disoriented. EMS noted new onset Afib. Her last known well time is yesterday evening when her family spoke to her on the phone. She currently denies complaints but is limited as a historian. NIHSS 2 for disorientation. Not a candidate for thrombolytics due to being out of the 4.5 hour window. MRI Brain shows small scattered bilateral acute infarcts suspicious for embolic etiology, possibly due to Afib. Stroke workup advised.   Recommendations: Our recommendations are outlined below.  Diagnostic Studies : CTA head/neck with contrast OR MRA head/neck w/o contrast Please order TTE  Laboratory Studies : Lipid panel Please order Hemoglobin A1c Please order  Antithrombotic Medication : Bolus with Clopidogrel 300 mg bolus x1 and initiate dual antiplatelet therapy with Aspirin 81 mg daily and Clopidogrel 75 mg daily Please order Permissive hypertension, Antihypertensives with prn for first 24-48 hrs post stroke onset. If BP greater than 220/120 give Labetalol IV or Vasotec IV Please order Statins for LDL goal less than 70 Please order Can switch from antiplatelet medications to anticoagulation such as Eliquis at day 4 if patient remains stable  Nursing Recommendations : IV Fluids, avoid dextrose containing fluids, Maintain euglycemia Neuro checks q4 hrs x 24 hrs and then per shift Head of bed 30 degrees Continue with  Telemetry  Consultations : Recommend Speech therapy if failed dysphagia screen Physical therapy/Occupational therapy Inpatient rehab if recommended by physical/occupational therapy  DVT Prophylaxis : SCDs, Pneumatic Compression  Disposition : Neurology will follow   ----------------------------------------------------------------------------------------------------    Metrics: [facility] Notification Time: 01/26/2023 22:04:59 Stamp Time: 01/26/2023 22:07:41 Callback Response Time: 01/26/2023 22:08:55  Primary Provider Notified of Diagnostic Impression and Management Plan on: 01/26/2023 22:48:47    Imaging reviewed  Labs reviewed   ----------------------------------------------------------------------------------------------------  Chief Complaint: AMS  History of Present Illness: Patient is a 89 year old Female. 89yo woman w/PMH of HTN, DM, HLD, CAD p/w AMS on 01/26/23. She reportedly developed confusion while at church. Her friend brought her to church initially and she was already acting abnormally. At church, she wasn't answering question appropriately and was disoriented. EMS noted new onset Afib. Her last known well time is yesterday evening when her family spoke to her on the phone. She currently denies complaints but is limited as a historian.   Past Medical History: Other PMH:  see hpi unable to obtain due to:   Patient Is Confused  Medications:  No Anticoagulant use  Antiplatelet use: Yes aspirin Reviewed EMR for current medications  Allergies:  Reviewed Allergies Unable To Obtain Due To: Patient Is Confused  Social History: Unable To Obtain Due To Patient Status : Patient Is Confused  Family History:  Family History Cannot Be Obtained Because:Patient Is Confused  ROS : ROS Cannot Be Obtained Because:  Patient Is Confused  Past Surgical History: Past Surgical History Cannot Be Obtained Because: Patient Is Confused There Is No Surgical  History Contributory To Today's Visit   Examination: BP(/), Pulse(80), 1A: Level of  Consciousness - Alert; keenly responsive + 0 1B: Ask Month and Age - Could Not Answer Either Question Correctly + 2 1C: Blink Eyes & Squeeze Hands - Performs Both Tasks + 0 2: Test Horizontal Extraocular Movements - Normal + 0 3: Test Visual Fields - No Visual Loss + 0 4: Test Facial Palsy (Use Grimace if Obtunded) - Normal symmetry + 0 5A: Test Left Arm Motor Drift - No Drift for 10 Seconds + 0 5B: Test Right Arm Motor Drift - No Drift for 10 Seconds + 0 6A: Test Left Leg Motor Drift - No Drift for 5 Seconds + 0 6B: Test Right Leg Motor Drift - No Drift for 5 Seconds + 0 7: Test Limb Ataxia (FNF/Heel-Shin) - No Ataxia + 0 8: Test Sensation - Normal; No sensory loss + 0 9: Test Language/Aphasia - Normal; No aphasia + 0 10: Test Dysarthria - Normal + 0 11: Test Extinction/Inattention - No abnormality + 0  NIHSS Score: 2  Spoke with : ED MD    This consult was conducted in real time using interactive audio and Immunologist. Patient was informed of the technology being used for this visit and agreed to proceed. Patient located in hospital and provider located at home/office setting.  Patient is being evaluated for possible acute neurologic impairment and high probability of imminent or life - threatening deterioration.I spent total of 35 minutes providing care to this patient, including time for face to face visit via telemedicine, review of medical records, imaging studies and discussion of findings with providers, the patient and / or family.   Dr Gwenyth Bender   [facility] For Inpatient follow-up with [facility] physician please call [facility] [phone]. This is not an outpatient service. Post hospital discharge, please contact hospital directly.  Please do not communicate with [facility] physicians via secure chat. If you have any questions, Please contact [facility]. Please call or  reconsult our service if there are any clinical or diagnostic changes.

## 2023-01-26 NOTE — Assessment & Plan Note (Signed)
Continue levothyroxine at 88 mcg.  ? ?

## 2023-01-26 NOTE — ED Provider Notes (Signed)
[facility] Provider Note   Event Date/Time   First MD Initiated Contact with Patient 01/26/23 1916     (approximate) History  Altered Mental Status  HPI Barbara Butler is a 89 y.o. female who presents with her daughter-in-law and friend from church after being found to be extremely confused.  Patient is daughter provides most history and states that she was at church at around 10 when friends noticed she was answering questions inappropriately.  Patient lives at home by herself so unknown last time she was at her baseline.  Patient has been able to identify herself but not place or people ROS: Patient currently denies any vision changes, tinnitus, difficulty speaking, facial droop, sore throat, chest pain, shortness of breath, abdominal pain, nausea/vomiting/diarrhea, dysuria, or weakness/numbness/paresthesias in any extremity   Physical Exam  Triage Vital Signs: ED Triage Vitals  Encounter Vitals Group     BP 01/26/23 1620 (!) 176/113     Systolic BP Percentile --      Diastolic BP Percentile --      Pulse Rate 01/26/23 1620 (!) 105     Resp 01/26/23 1620 16     Temp 01/26/23 1620 98 F (36.7 C)     Temp Source 01/26/23 1620 Oral     SpO2 01/26/23 1620 98 %     Weight --      Height 01/26/23 1627 5\' 2"  (1.575 m)     Head Circumference --      Peak Flow --      Pain Score 01/26/23 1620 0     Pain Loc --      Pain Education --      Exclude from Growth Chart --    Most recent vital signs: Vitals:   01/26/23 2135 01/26/23 2200  BP: (!) 178/96 (!) 170/92  Pulse: 96 87  Resp: (!) 24 18  Temp:    SpO2: 100% 99%   General: Awake, oriented to self and place.  Patient believes it is 90. CV:  Good peripheral perfusion.  Resp:  Normal effort.  Abd:  No distention.  Other:  Elderly overweight African-American female laying in bed in no acute distress.  Patient answering questions inappropriately ED Results / Procedures / Treatments  Labs (all labs  ordered are listed, but only abnormal results are displayed) Labs Reviewed  COMPREHENSIVE METABOLIC PANEL - Abnormal; Notable for the following components:      Result Value   Sodium 134 (*)    Glucose, Bld 193 (*)    Total Bilirubin 1.4 (*)    GFR, Estimated 54 (*)    All other components within normal limits  CBC WITH DIFFERENTIAL/PLATELET - Abnormal; Notable for the following components:   RBC 5.16 (*)    All other components within normal limits  URINALYSIS, ROUTINE W REFLEX MICROSCOPIC - Abnormal; Notable for the following components:   Color, Urine YELLOW (*)    APPearance CLEAR (*)    Protein, ur 30 (*)    All other components within normal limits  PROTIME-INR - Abnormal; Notable for the following components:   Prothrombin Time 15.9 (*)    INR 1.3 (*)    All other components within normal limits  CBG MONITORING, ED - Abnormal; Notable for the following components:   Glucose-Capillary 184 (*)    All other components within normal limits  RESP PANEL BY RT-PCR (FLU A&B, COVID) ARPGX2  TSH  CBG MONITORING, ED   EKG ED ECG REPORT I,  Merwyn Katos, the attending physician, personally viewed and interpreted this ECG. Date: 01/26/2023 EKG Time: 1621 Rate: 97 Rhythm: normal sinus rhythm QRS Axis: normal Intervals: normal ST/T Wave abnormalities: normal Narrative Interpretation: no evidence of acute ischemia RADIOLOGY ED MD interpretation: Single view portable chest x-ray interpreted independently by me and shows borderline heart size with mild interstitial prominence and no definite edema or focal airspace disease  CT of the head without contrast interpreted by me shows no evidence of acute abnormalities including no intracerebral hemorrhage, obvious masses, or significant edema  MRI brain shows multiple subacute infarcts in bilateral occipital lobes and right posterior splenium -Agree with radiology assessment Official radiology report(s): DG Chest Portable 1  View  Result Date: 01/26/2023 CLINICAL DATA:  Altered mental status. EXAM: PORTABLE CHEST 1 VIEW COMPARISON:  None Available. FINDINGS: 1644 hours. The heart size is at the upper limits of normal for AP technique. There is aortic atherosclerosis and mitral annular calcification. Mild interstitial prominence throughout the lungs without definite edema. No focal airspace disease, pleural effusion or pneumothorax. No acute osseous findings are evident. IMPRESSION: Borderline heart size with mild interstitial prominence. No definite edema or focal airspace disease. Electronically Signed   By: Carey Bullocks M.D.   On: 01/26/2023 17:30   CT Head Wo Contrast  Result Date: 01/26/2023 CLINICAL DATA:  Delirium EXAM: CT HEAD WITHOUT CONTRAST TECHNIQUE: Contiguous axial images were obtained from the base of the skull through the vertex without intravenous contrast. RADIATION DOSE REDUCTION: This exam was performed according to the departmental dose-optimization program which includes automated exposure control, adjustment of the mA and/or kV according to patient size and/or use of iterative reconstruction technique. COMPARISON:  None Available. FINDINGS: Brain: No evidence of acute infarction, hemorrhage, hydrocephalus, extra-axial collection or mass lesion/mass effect. There is moderate diffuse atrophy. There is moderate periventricular and deep white matter hypodensity, likely chronic small vessel ischemic change. Vascular: Atherosclerotic calcifications are present within the cavernous internal carotid arteries. Skull: Normal. Negative for fracture or focal lesion. Sinuses/Orbits: No acute finding. Other: None. IMPRESSION: 1. No acute intracranial process. 2. Moderate atrophy and chronic small vessel ischemic changes. Electronically Signed   By: Darliss Cheney M.D.   On: 01/26/2023 17:23   PROCEDURES: Critical Care performed: Yes, see critical care procedure note(s) .1-3 Lead EKG Interpretation  Performed by:  Merwyn Katos, MD Authorized by: Merwyn Katos, MD     Interpretation: abnormal     ECG rate:  87   ECG rate assessment: normal     Rhythm: atrial fibrillation     Ectopy: none     Conduction: normal   CRITICAL CARE Performed by: Merwyn Katos  Total critical care time: 33 minutes  Critical care time was exclusive of separately billable procedures and treating other patients.  Critical care was necessary to treat or prevent imminent or life-threatening deterioration.  Critical care was time spent personally by me on the following activities: development of treatment plan with patient and/or surrogate as well as nursing, discussions with consultants, evaluation of patient's response to treatment, examination of patient, obtaining history from patient or surrogate, ordering and performing treatments and interventions, ordering and review of laboratory studies, ordering and review of radiographic studies, pulse oximetry and re-evaluation of patient's condition.  MEDICATIONS ORDERED IN ED: Medications  irbesartan (AVAPRO) tablet 300 mg (300 mg Oral Given 01/26/23 2021)  clopidogrel (PLAVIX) 75 MG tablet (has no administration in time range)  amLODipine (NORVASC) tablet 5 mg (5 mg Oral  Given 01/26/23 2017)  hydrALAZINE (APRESOLINE) tablet 50 mg (50 mg Oral Given 01/26/23 2021)  clopidogrel (PLAVIX) tablet 75 mg (75 mg Oral Given 01/26/23 2307)   IMPRESSION / MDM / ASSESSMENT AND PLAN / ED COURSE  I reviewed the triage vital signs and the nursing notes.                             The patient is on the cardiac monitor to evaluate for evidence of arrhythmia and/or significant heart rate changes. Patient's presentation is most consistent with acute presentation with potential threat to life or bodily function. Patient presents for confusion PMH risk factors: Hypertension, hypercholesterolemia, type 2 diabetes Neurologic Deficits: Disorientation Last known Well Time: Unknown NIH Stroke  Score: 2 Given History and Exam I have lower suspicion for infectious etiology, neurologic changes secondary to toxicologic ingestion, seizure, complex migraine. Presentation concerning for possible stroke requiring workup.  Workup: Labs: POC glucose, CBC, BMP, LFTs, Troponin, PT/INR, PTT, Type and Screen Other Diagnostics: ECG, CXR, non-contrast head CT followed by CTA brain and neck (to r/o large vessel occlusion amenable to thrombectomy) Interventions: Patient low on NIH scale and out of the window for tpa.  Consult: Neurology. Discussed with teleneurology regarding patients neurological symptoms and last well-known time and eligibility for TPA criteria.  Patient is far outside of the window for tPA at this time.  Given atrial fibrillation they did not recommend heparin at this time but did recommend aspirin and Plavix and transition to anticoagulation in 2-3 days Disposition: Admission to medicine.   FINAL CLINICAL IMPRESSION(S) / ED DIAGNOSES   Final diagnoses:  Altered mental status, unspecified altered mental status type  Cerebrovascular accident (CVA), unspecified mechanism (HCC)   Rx / DC Orders   ED Discharge Orders     None      Note:  This document was prepared using Dragon voice recognition software and may include unintentional dictation errors.   Merwyn Katos, MD 01/26/23 [phone]

## 2023-01-26 NOTE — ED Provider Notes (Signed)
Emergency Medicine Provider Triage Evaluation Note  Barbara Butler , a 89 y.o. female  was evaluated in triage.  Pt complains of confusion.  Her daughter-in-law brings her.  Patient went to church today at about 11 AM, and her neighbor brought her because she was having a little trouble though the details of which is not clear.  She was last known to be normal sometime yesterday evening when her daughter-in-law spoke to her on the phone.  No one else seen or talked with her in the interim until this morning when she was preparing for church.  She was noticed to be confused and not quite herself by her neighbor and also members of the church notified family  They have not noticed any facial droop weakness or complaint of anything other than she just seems a bit confused.  She normally participates in her charts and is quite interactive, but today seemed like she was confused with even fairly simple tasks.  Review of Systems  Positive: Confusion.  Patient herself reports that she just feels a little confused.  She cannot describe it any further but just does not quite feel herself.   Negative: Denies being in any pain or discomfort.  Has not noticed that she is weak in any arm or leg.  Specifically denies headache numbness or tingling.  Physical Exam  BP (!) 176/113   Pulse (!) 105   Temp 98 F (36.7 C) (Oral)   Resp 16   SpO2 98%  Gen:   Awake, no distress speaks in clear sentences.  Gives her birthdate and name correctly mNIHSS = zero Resp:  Normal effort  MSK:   Moves extremities without difficulty  Other:    Medical Decision Making     Medically screening exam initiated at 4:25 PM.  Appropriate orders placed.  [name] was informed that the remainder of the evaluation will be completed by another provider, this initial triage assessment does not replace that evaluation, and the importance of remaining in the ED until their evaluation is complete.  Have ordered workup  including CT scan of the head, labs, urinalysis etc.  Her symptoms nonfocal examination seems suggestive against acute central cause.  She is also outside the window for thrombolytic therapy with last known well yesterday evening, and symptoms discovered at roughly 11 AM today.  However the patient was by herself and neighbor noted her to be slightly confused driving her to church.  She is negative for LVO by VAN scale.  DG Chest Portable 1 View  Result Date: 01/26/2023 CLINICAL DATA:  Altered mental status. EXAM: PORTABLE CHEST 1 VIEW COMPARISON:  None Available. FINDINGS: 1644 hours. The heart size is at the upper limits of normal for AP technique. There is aortic atherosclerosis and mitral annular calcification. Mild interstitial prominence throughout the lungs without definite edema. No focal airspace disease, pleural effusion or pneumothorax. No acute osseous findings are evident. IMPRESSION: Borderline heart size with mild interstitial prominence. No definite edema or focal airspace disease. Electronically Signed   By: Carey Bullocks M.D.   On: 01/26/2023 17:30   CT Head Wo Contrast  Result Date: 01/26/2023 CLINICAL DATA:  Delirium EXAM: CT HEAD WITHOUT CONTRAST TECHNIQUE: Contiguous axial images were obtained from the base of the skull through the vertex without intravenous contrast. RADIATION DOSE REDUCTION: This exam was performed according to the departmental dose-optimization program which includes automated exposure control, adjustment of the mA and/or kV according to patient size and/or use of iterative  reconstruction technique. COMPARISON:  None Available. FINDINGS: Brain: No evidence of acute infarction, hemorrhage, hydrocephalus, extra-axial collection or mass lesion/mass effect. There is moderate diffuse atrophy. There is moderate periventricular and deep white matter hypodensity, likely chronic small vessel ischemic change. Vascular: Atherosclerotic calcifications are present within the  cavernous internal carotid arteries. Skull: Normal. Negative for fracture or focal lesion. Sinuses/Orbits: No acute finding. Other: None. IMPRESSION: 1. No acute intracranial process. 2. Moderate atrophy and chronic small vessel ischemic changes. Electronically Signed   By: Darliss Cheney M.D.   On: 01/26/2023 17:23       Sharyn Creamer, MD 01/26/23 2055

## 2023-01-26 NOTE — Assessment & Plan Note (Signed)
Vitals:   01/26/23 2000 01/26/23 2030 01/26/23 2135 01/26/23 2200  BP: (!) 190/120  (!) 178/96 (!) 170/92  Pulse: 91 (!) 56 96 87  Temp:      Resp: (!) 22 (!) 22 (!) 24 18  Height:      SpO2: 99% 100% 100% 99%  TempSrc:      BP meds held for permissive HTN.

## 2023-01-26 NOTE — Assessment & Plan Note (Addendum)
MRI shows BL subacute infarcts whish I suspect are new.  Pt is outside TPA window. And recommended inexpedient workup go confusion. Per neuro recommendation: CTA head/neck with contrast OR MRA head/neck w/o contrast Please order TTE   Laboratory Studies : Lipid panel Please order Hemoglobin A1c Please order  Antithrombotic Medication : Bolus with Clopidogrel 300 mg bolus x1 and initiate dual antiplatelet therapy with Aspirin 81 mg daily and Clopidogrel 75 mg daily Please order Permissive hypertension, Antihypertensives with prn for first 24-48 hrs post stroke onset. If BP greater than 220/120 give Labetalol IV or Vasotec IV Please order Statins for LDL goal less than 70 Please order Can switch from antiplatelet medications to anticoagulation such as Eliquis at day 4 if patient remains stable Nursing Recommendations : IV Fluids, avoid dextrose containing fluids, Maintain euglycemia Neuro checks q4 hrs x 24 hrs and then per shift Head of bed 30 degrees Continue with Telemetry Plz note pt was give 75 Plavix in ed so we have continued same without giving the additional difference.

## 2023-01-27 ENCOUNTER — Observation Stay: Payer: Medicare PPO

## 2023-01-27 ENCOUNTER — Other Ambulatory Visit: Payer: Self-pay

## 2023-01-27 ENCOUNTER — Observation Stay (HOSPITAL_BASED_OUTPATIENT_CLINIC_OR_DEPARTMENT_OTHER)
Admit: 2023-01-27 | Discharge: 2023-01-27 | Disposition: A | Discharge status: Home / Self Care | Payer: Medicare PPO | Attending: Internal Medicine | Admitting: Internal Medicine

## 2023-01-27 ENCOUNTER — Encounter: Payer: Self-pay | Admitting: Internal Medicine

## 2023-01-27 DIAGNOSIS — I6389 Other cerebral infarction: Secondary | ICD-10-CM | POA: Diagnosis not present

## 2023-01-27 DIAGNOSIS — E1165 Type 2 diabetes mellitus with hyperglycemia: Secondary | ICD-10-CM

## 2023-01-27 DIAGNOSIS — E039 Hypothyroidism, unspecified: Secondary | ICD-10-CM | POA: Diagnosis not present

## 2023-01-27 DIAGNOSIS — R410 Disorientation, unspecified: Secondary | ICD-10-CM | POA: Diagnosis not present

## 2023-01-27 DIAGNOSIS — I10 Essential (primary) hypertension: Secondary | ICD-10-CM | POA: Diagnosis not present

## 2023-01-27 DIAGNOSIS — I639 Cerebral infarction, unspecified: Principal | ICD-10-CM

## 2023-01-27 DIAGNOSIS — I4891 Unspecified atrial fibrillation: Secondary | ICD-10-CM | POA: Diagnosis not present

## 2023-01-27 LAB — ECHOCARDIOGRAM COMPLETE
AR max vel: 2.04 cm2
AV Area VTI: 2.16 cm2
AV Area mean vel: 2.11 cm2
AV Mean grad: 2.5 mmHg
AV Peak grad: 6 mmHg
Ao pk vel: 1.23 m/s
Area-P 1/2: 4.21 cm2
Height: 62 in
MV VTI: 1.92 cm2
P 1/2 time: 508 msec
S' Lateral: 1.8 cm

## 2023-01-27 LAB — CBG MONITORING, ED
Glucose-Capillary: 156 mg/dL — ABNORMAL HIGH (ref 70–99)
Glucose-Capillary: 233 mg/dL — ABNORMAL HIGH (ref 70–99)
Glucose-Capillary: 69 mg/dL — ABNORMAL LOW (ref 70–99)
Glucose-Capillary: 167 mg/dL — ABNORMAL HIGH (ref 70–99)

## 2023-01-27 LAB — LIPID PANEL
Cholesterol: 122 mg/dL (ref 0–200)
HDL: 26 mg/dL — ABNORMAL LOW (ref >40)
LDL Cholesterol: 82 mg/dL (ref 0–99)
Total CHOL/HDL Ratio: 4.7 RATIO
Triglycerides: 71 mg/dL (ref <150)
VLDL: 14 mg/dL (ref 0–40)

## 2023-01-27 LAB — CBC
HCT: 38.2 % (ref 36.0–46.0)
Hemoglobin: 12.9 g/dL (ref 12.0–15.0)
MCH: 28.8 pg (ref 26.0–34.0)
MCHC: 33.8 g/dL (ref 30.0–36.0)
MCV: 85.3 fL (ref 80.0–100.0)
Platelets: 208 10*3/uL (ref 150–400)
RBC: 4.48 MIL/uL (ref 3.87–5.11)
RDW: 14.7 % (ref 11.5–15.5)
WBC: 4.7 10*3/uL (ref 4.0–10.5)
nRBC: 0 % (ref 0.0–0.2)

## 2023-01-27 MED ORDER — ACETAMINOPHEN 325 MG PO TABS: 650.0000 mg | ORAL_TABLET | ORAL | Status: DC | PRN

## 2023-01-27 MED ORDER — INSULIN ASPART 100 UNIT/ML IJ SOLN
0.0000 [IU] | Freq: Three times a day (TID) | INTRAMUSCULAR | Status: DC
  Administered 2023-01-27: 5 [IU] via SUBCUTANEOUS
  Administered 2023-01-27 – 2023-01-28 (×2): 3 [IU] via SUBCUTANEOUS
  Administered 2023-01-28: 8 [IU] via SUBCUTANEOUS
  Administered 2023-01-29: 3 [IU] via SUBCUTANEOUS
  Filled 2023-01-27 (×5): qty 1

## 2023-01-27 MED ORDER — IOHEXOL 350 MG/ML SOLN
75.0000 mL | Freq: Once | INTRAVENOUS | Status: AC | PRN
  Administered 2023-01-27: 75 mL via INTRAVENOUS

## 2023-01-27 MED ORDER — APIXABAN 2.5 MG PO TABS: 2.5000 mg | ORAL_TABLET | Freq: Two times a day (BID) | ORAL | 1 refills | Status: DC

## 2023-01-27 MED ORDER — SODIUM CHLORIDE 0.9 % IV SOLN: INTRAVENOUS | Status: DC

## 2023-01-27 MED ORDER — ACETAMINOPHEN 650 MG RE SUPP: 650.0000 mg | RECTAL | Status: DC | PRN

## 2023-01-27 MED ORDER — ACETAMINOPHEN 160 MG/5ML PO SOLN: 650.0000 mg | ORAL | Status: DC | PRN

## 2023-01-27 MED ORDER — APIXABAN 2.5 MG PO TABS
2.5000 mg | ORAL_TABLET | Freq: Two times a day (BID) | ORAL | Status: DC
  Administered 2023-01-27 – 2023-01-28 (×2): 2.5 mg via ORAL
  Filled 2023-01-27 (×2): qty 1

## 2023-01-27 MED ORDER — STROKE: EARLY STAGES OF RECOVERY BOOK: Freq: Once | Status: DC

## 2023-01-27 NOTE — ED Notes (Signed)
Patient assisted to restroom by this RN at this time. Patient assisted back in bed and eating breakfast tray. Family member at bedside.

## 2023-01-27 NOTE — Progress Notes (Signed)
*  PRELIMINARY RESULTS* Echocardiogram 2D Echocardiogram has been performed.  Carolyne Fiscal 01/27/2023, 1:37 PM

## 2023-01-27 NOTE — TOC CM/SW Note (Signed)
Cm received consult for HH/DME/SNF placement. Cm awaiting PT/OT evaluations. Cm continue to follow.

## 2023-01-27 NOTE — Evaluation (Signed)
Occupational Therapy Evaluation Patient Details Name: Barbara Butler MRN: [id number] DOB: 1929/11/23 Today's Date: 01/27/2023   History of Present Illness Barbara Butler is a 89 y/o F with PMH including DM2, HTN, a-fib, neuropathy. Admitted with confusion and subacute CVA after she was confused while at church with friends.   Clinical Impression   Patient received for OT evaluation. See flowsheet below for details of function. Generally, patient requiring MOD (I) with extra time for bed mobility, HHA-supervision (intermittent unilateral support needed) for functional mobility, and supervision-MIN A for ADLs. Patient will benefit from continued OT while in acute care. Would recommend family/friend supervision upon d/c for safety and monitoring of cognitive status/IADLs.       Recommendations for follow up therapy are one component of a multi-disciplinary discharge planning process, led by the attending physician.  Recommendations may be updated based on patient status, additional functional criteria and insurance authorization.   Assistance Recommended at Discharge Intermittent Supervision/Assistance  Patient can return home with the following A little help with walking and/or transfers;A little help with bathing/dressing/bathroom;Assistance with cooking/housework;Direct supervision/assist for medications management;Direct supervision/assist for financial management;Assist for transportation;Help with stairs or ramp for entrance    Functional Status Assessment  Patient has had a recent decline in their functional status and demonstrates the ability to make significant improvements in function in a reasonable and predictable amount of time.  Equipment Recommendations  Other (comment) (to be determined)    Recommendations for Other Services       Precautions / Restrictions Precautions Precautions: Fall Restrictions Weight Bearing Restrictions: No      Mobility Bed  Mobility Overal bed mobility: Modified Independent             General bed mobility comments: with extra time    Transfers Overall transfer level: Needs assistance Equipment used: 1 person hand held assist Transfers: Sit to/from Stand Sit to Stand: Supervision, Min guard           General transfer comment: OT managing lines/leads      Balance Overall balance assessment: Mild deficits observed, not formally tested                                         ADL either performed or assessed with clinical judgement   ADL Overall ADL's : Needs assistance/impaired     Grooming: Supervision/safety;Standing;Wash/dry hands Grooming Details (indicate cue type and reason): able to find/utilize soap and operate sink appropriately               Lower Body Dressing Details (indicate cue type and reason): not tested, but pt able to make figure four position while seated on EOB today to simulate donning LB clothing. Toilet Transfer: Supervision/safety;Regular Social worker and Hygiene: Supervision/safety;Sit to/from stand Toileting - Clothing Manipulation Details (indicate cue type and reason): pt pulled underwear down and up before/after toileting with BIL hands, and able to use toilet paper appropriately while seated     Functional mobility during ADLs: Supervision/safety;Min guard (pt with intermittent handheld assist during mobility, no AD utilized.) General ADL Comments: Pt appearing mildly unsteady, but no overt loss of balance today; able to appropriately engage in ADLs.     Vision         Perception     Praxis      Pertinent Vitals/Pain Pain Assessment Pain Assessment: No/denies pain  Hand Dominance  (did not explicitly ask; pt using L hand for toilet hygiene today)   Extremity/Trunk Assessment Upper Extremity Assessment Upper Extremity Assessment: Overall WFL for tasks assessed   Lower Extremity  Assessment Lower Extremity Assessment: Overall WFL for tasks assessed       Communication Communication Communication: No difficulties   Cognition Arousal/Alertness: Awake/alert Behavior During Therapy: WFL for tasks assessed/performed Overall Cognitive Status: Within Functional Limits for tasks assessed                                 General Comments: Pt is oriented to person, place ("hospital"), grossly to time (knows that it is July; unable to answer the year correctly even with multiple attempts several times during session; pt ultimately states it is 32; knows she is born in [birth year]), knows that she 'didn't feel well" at the hospital. Pt is aware that the clock on the wall in her room is not working and asks what time it is.     General Comments  Pt on room air; agreeable throughout session; no signs of shortness of breath; no complaint of dizziness.    Exercises     Shoulder Instructions      Home Living Family/patient expects to be discharged to:: Private residence Living Arrangements: Alone Available Help at Discharge: Family;Available PRN/intermittently (pt states she has family in the area; is not very specific about where or how much assist they can give) Type of Home: House Home Access: Stairs to enter [employer] of Steps: 4 Entrance Stairs-Rails: Right;Left;Can reach both Home Layout: One level     Bathroom Shower/Tub: Tub/shower unit;Walk-in shower   Bathroom Toilet: Standard     Home Equipment: None   Additional Comments: Need to verify home set up and prior level with family or friends      Prior Functioning/Environment Prior Level of Function : Independent/Modified Independent;Driving             Mobility Comments: Pt states she does not use AD "but maybe I need to"; denies falls. ADLs Comments: States she is (I) with ADLs and also drives and grocery shops        OT Problem List: Decreased cognition;Impaired balance  (sitting and/or standing)      OT Treatment/Interventions: Self-care/ADL training;Therapeutic exercise;Cognitive remediation/compensation;Therapeutic activities;Patient/family education    OT Goals(Current goals can be found in the care plan section) Acute Rehab OT Goals Patient Stated Goal: Get better; go home OT Goal Formulation: With patient Time For Goal Achievement: 02/10/23 Potential to Achieve Goals: Good ADL Goals Pt Will Perform Lower Body Bathing: with modified independence;sit to/from stand Pt Will Perform Lower Body Dressing: with modified independence;sit to/from stand Pt Will Perform Toileting - Clothing Manipulation and hygiene: with modified independence;sit to/from stand Pt Will Perform Tub/Shower Transfer: with modified independence;ambulating  OT Frequency: Min 1X/week    Co-evaluation              AM-PAC OT "6 Clicks" Daily Activity     Outcome Measure Help from another person eating meals?: None Help from another person taking care of personal grooming?: A Little Help from another person toileting, which includes using toliet, bedpan, or urinal?: A Little Help from another person bathing (including washing, rinsing, drying)?: A Little Help from another person to put on and taking off regular upper body clothing?: None Help from another person to put on and taking off regular lower body clothing?: None  6 Click Score: 21   End of Session Nurse Communication: Mobility status  Activity Tolerance: Patient tolerated treatment well Patient left: in bed;with call bell/phone within reach  OT Visit Diagnosis: Other (comment) (decreased orientation)                Time: 5956-3875 OT Time Calculation (min): 14 min Charges:  OT General Charges $OT Visit: 1 Visit OT Evaluation $OT Eval Moderate Complexity: 1 Mod   Junie Panning, MS, OTR/L  Alvester Morin 01/27/2023, 9:28 AM

## 2023-01-27 NOTE — TOC Initial Note (Signed)
Transition of Care [facility]) - Initial/Assessment Note    Patient Details  Name: Barbara Butler MRN: [id number] Date of Birth: Feb 22, 1930  Transition of Care [facility]) CM/SW Contact:    Kreg Shropshire, RN Phone Number: 01/27/2023, 11:32 AM  Clinical Narrative:                 Cm received notifcation that pt is going to need HHPT. Cm completed assessment. Pt has daughters at bedside. She stated that she lives alone and only uses a cane. Daughters will provide transportation once d/c if needed. PCP is Dr. Marcello Fennel. No problems with getting medications at pharmacy.   Cm offered choice on [facility] company. Pt stated she has no preference in [facility] company nor SNF if needed. Pt currently under observation and planning to admit to hospital. Cm to continue to assess for tocs needs closer to d/c.  Expected Discharge Plan: Home w Home Health Services     Patient Goals and CMS Choice   CMS Medicare.gov Compare Post Acute Care list provided to:: Patient Choice offered to / list presented to : Patient      Expected Discharge Plan and Services       Living arrangements for the past 2 months: Single Family Home                                      Prior Living Arrangements/Services Living arrangements for the past 2 months: Single Family Home Lives with:: Self                   Activities of Daily Living      Permission Sought/Granted                  Emotional Assessment   Attitude/Demeanor/Rapport: Engaged Affect (typically observed): Calm Orientation: : Oriented to Self, Oriented to Place, Oriented to  Time, Oriented to Situation      Admission diagnosis:  Confusion and disorientation [R41.0] Patient Active Problem List   Diagnosis Date Noted   Hypertension 01/26/2023   Hypothyroidism 01/26/2023   Confusion and disorientation / subacute cva. 01/26/2023   Type 2 diabetes mellitus with hyperglycemia, without long-term current use of insulin (HCC) 08/31/2019   PCP:   [name], MD Pharmacy:   [facility] [address]),  - [address] [place]) [place] [address] Phone: [phone] Fax: [phone]     Social Determinants of Health (SDOH) Social History: SDOH Screenings   Tobacco Use: Medium Risk (01/26/2023)   SDOH Interventions:     Readmission Risk Interventions     No data to display

## 2023-01-27 NOTE — Evaluation (Signed)
Speech Language Pathology Evaluation Patient Details Name: Barbara Butler MRN: [id number] DOB: 1929-12-22 Today's Date: 01/27/2023 Time: 1330-1350 SLP Time Calculation (min) (ACUTE ONLY): 20 min  Problem List:  Patient Active Problem List   Diagnosis Date Noted   Hypertension 01/26/2023   Hypothyroidism 01/26/2023   Confusion and disorientation / subacute cva. 01/26/2023   Type 2 diabetes mellitus with hyperglycemia, without long-term current use of insulin (HCC) 08/31/2019   Past Medical History:  Past Medical History:  Diagnosis Date   Anemia    Diabetes mellitus without complication (HCC)    diet controlled   Diabetic neuropathy (HCC)    Dysrhythmia    Hypertension    Hypothyroidism    Past Surgical History:  Past Surgical History:  Procedure Laterality Date   CATARACT EXTRACTION W/PHACO Right 01/28/2019   Procedure: CATARACT EXTRACTION PHACO AND INTRAOCULAR LENS PLACEMENT (IOC) RIGHT DIABETES;  Surgeon: Elliot Cousin, MD;  Location: [facility] ORS;  Service: Ophthalmology;  Laterality: Right;  [place] 00:49.3 CDE 8.12 Fluid Pack Lot # [id number]   CATARACT EXTRACTION W/PHACO Left 02/25/2019   Procedure: CATARACT EXTRACTION PHACO AND INTRAOCULAR LENS PLACEMENT (IOC), LEFT, DIABETIC, VISION BLUE;  Surgeon: Elliot Cousin, MD;  Location: [facility] ORS;  Service: Ophthalmology;  Laterality: Left;  [place]  01:10 CDE 11.43 Fluid pack lot # [id number]   FRACTURE SURGERY     ORIF WRIST FRACTURE Right 06/28/2016   Procedure: OPEN REDUCTION INTERNAL FIXATION (ORIF) WRIST FRACTURE;  Surgeon: Kennedy Bucker, MD;  Location: [facility] ORS;  Service: Orthopedics;  Laterality: Right;   SALPINGECTOMY     HPI:  89yo woman w/PMH of HTN, DM, HLD, CAD p/w AMS on 01/26/23. She reportedly developed confusion while at church. Her friend brought her to church initially and she was already acting abnormally. At church, she wasn't answering question appropriately and was disoriented. EMS noted new onset Afib. MRI Brain shows small  scattered bilateral acute infarcts suspicious for embolic etiology, possibly due to Afib. Pt currently on a regular solids and thin liquid diet and passes Yale swallow screen.   Assessment / Plan / Recommendation Clinical Impression  Pt seen for cognitive linguistic evaluation in the setting of acute CVA. Assessment consisting of pt/family (daughter in law present for duration of eval) interview and completion of portions of the Mini Mental State Examination (MMSE). Score of 16/28 obtained on the MMSE, with writing and visual processing deferred secondary to lack of glasses present for assessment. Pt presents with grossly moderate cognitive communication deficit, c/b moderately impaired recall, thought organization, and executive function and mildly impaired orientation. Verbal cues (binary options and semantic cues) and repetition bolstered cognitive processing and recall. Independent expressive/receptive language and simple sustained attention demonstrated during assessment. Motor speech grossly WFL. Pt with emerging awareness for current deficits, reporting being "better than yesterday," with pt/family continuing to report variance from cognitive baseline. Therefore, recommend continued assessment to determine approximation to baseline and follow up recommendations. Pt/family in agreement with stated plan.       SLP Assessment  SLP Recommendation/Assessment: Patient needs continued Speech Lanaguage Pathology Services SLP Visit Diagnosis: Cognitive communication deficit (R41.841)    Recommendations for follow up therapy are one component of a multi-disciplinary discharge planning process, led by the attending physician.  Recommendations may be updated based on patient status, additional functional criteria and insurance authorization.    Follow Up Recommendations  Follow physician's recommendations for discharge plan and follow up therapies    Assistance Recommended at Discharge  Frequent or  constant Supervision/Assistance  Functional Status Assessment Patient has had a recent decline in their functional status and demonstrates the ability to make significant improvements in function in a reasonable and predictable amount of time.  Frequency and Duration min 2x/week  2 weeks      SLP Evaluation Cognition  Overall Cognitive Status: Impaired/Different from baseline Arousal/Alertness: Awake/alert Orientation Level: Oriented to place;Oriented to person (verbal cues required for time and date) Attention: Sustained Sustained Attention: Appears intact Memory: Impaired Memory Impairment: Decreased recall of new information;Decreased short term memory Decreased Short Term Memory: Verbal basic Awareness: Impaired Awareness Impairment: Intellectual impairment Problem Solving: Impaired Problem Solving Impairment: Verbal basic Executive Function: Organizing Organizing: Impaired Organizing Impairment: Verbal basic Behaviors:  (none) Safety/Judgment: Impaired Comments: safety reasoning 5/8 (verbal safety prompts)       Comprehension  Auditory Comprehension Overall Auditory Comprehension: Appears within functional limits for tasks assessed Visual Recognition/Discrimination Discrimination:  (intact for reading with large print- not formally  assessed) Reading Comprehension Reading Status:  (functional for phrase level- readers not present- pt benefited from large print)    Expression Expression Primary Mode of Expression: Verbal Verbal Expression Overall Verbal Expression: Appears within functional limits for tasks assessed Written Expression Written Expression: Not tested   Oral / Motor  Oral Motor/Sensory Function Overall Oral Motor/Sensory Function: Within functional limits Motor Speech Overall Motor Speech: Appears within functional limits for tasks assessed           [place]  Clapp MS [facility] SLP   [place] J Clapp 01/27/2023, 2:17 PM

## 2023-01-27 NOTE — Progress Notes (Signed)
       CROSS COVER NOTE  NAME: Barbara Butler MRN: [id number] DOB : Oct 24, 1929    Concern as stated by nurse / staff   I just took an EKG, it looks like A-Fib, but I don't see any Hx of A-Fib  TD or A-Flutter perhaps?      Pertinent findings on chart review: H&P, ER physician's note and teleneurologist note reviewed: Patient admitted on 7/21 with acute CVA and noted to be in A-fib by EMS.  Also documented in teleneurology's note     Assessment and  Interventions   Assessment:  A-fib/flutter, present and acknowledged on admission  Plan: See teleneurologist note for recommendation on initiation of Eliquis X X

## 2023-01-27 NOTE — Consult Note (Signed)
Cardiology Consultation   Patient ID: AIVA MISKELL MRN: [id number]; DOB: 1929-08-20  Admit date: 01/26/2023 Date of Consult: 01/27/2023  PCP:  [name], MD   [facility] Providers Cardiologist:  New   Patient Profile:   Barbara Butler is a 89 y.o. female with a hx of HTN, HLD, DM2, hypothyroidism, anemia who is being seen 01/27/2023 for the evaluation of Afib at the request of Dr. Nelson Chimes.  History of Present Illness:   Barbara Butler  has not prior cardiac history. She lives by herself and cares for herself OK. No alcohol, tobacco or drug use.  The patient presented to [facility] ED 01/26/23 for AMS and confusion. This was noted by friends and neighbors and daughter provided most of the history.  Unclear of last known normal since she lives by herself. No chest pain, SOB, palpitations, LLE, nasuea, vomiting, orthopnea or pnd reported.   In th ER BP 176/113, pulse 105, afebrile, RR 16, 98% O2. No focal deficits in the ER. Labs showed Sodium 134, BG 184, Scr 0.90, Hgb 14.4, WBC 5.5, TSH 3.3. EKG showed Afib/flutter 97bpm, iRBBB, nonspecific T wave changes. CT head non acute, chronic small vessel changes. CXR showed mild interstitial prominence. MRI brain showed possible acute to early subacute ischemic infarcts occipital lobes, no frank hemorrhage. Neurology was consulted and patient was admitted for further work-up.    Past Medical History:  Diagnosis Date   Anemia    Diabetes mellitus without complication (HCC)    diet controlled   Diabetic neuropathy (HCC)    Dysrhythmia    Hypertension    Hypothyroidism     Past Surgical History:  Procedure Laterality Date   CATARACT EXTRACTION W/PHACO Right 01/28/2019   Procedure: CATARACT EXTRACTION PHACO AND INTRAOCULAR LENS PLACEMENT (IOC) RIGHT DIABETES;  Surgeon: Elliot Cousin, MD;  Location: [facility] ORS;  Service: Ophthalmology;  Laterality: Right;  [place] 00:49.3 CDE 8.12 Fluid Pack Lot # [id number]   CATARACT EXTRACTION  W/PHACO Left 02/25/2019   Procedure: CATARACT EXTRACTION PHACO AND INTRAOCULAR LENS PLACEMENT (IOC), LEFT, DIABETIC, VISION BLUE;  Surgeon: Elliot Cousin, MD;  Location: [facility] ORS;  Service: Ophthalmology;  Laterality: Left;  [place]  01:10 CDE 11.43 Fluid pack lot # [id number]   FRACTURE SURGERY     ORIF WRIST FRACTURE Right 06/28/2016   Procedure: OPEN REDUCTION INTERNAL FIXATION (ORIF) WRIST FRACTURE;  Surgeon: Kennedy Bucker, MD;  Location: [facility] ORS;  Service: Orthopedics;  Laterality: Right;   SALPINGECTOMY       Home Medications:  Prior to Admission medications   Medication Sig Start Date End Date Taking? Authorizing Provider  amLODipine (NORVASC) 5 MG tablet Take 5 mg by mouth daily.   Yes [provider]  Ascorbic Acid (VITAMIN C) 1000 MG tablet Take 1,000 mg by mouth daily at 12 noon.   Yes [provider]  aspirin EC 81 MG tablet Take 81 mg by mouth daily at 12 noon.   Yes [provider]  cholecalciferol (VITAMIN D3) 25 MCG (1000 UT) tablet Take 1,000 Units by mouth daily at 12 noon.   Yes [provider]  hydrALAZINE (APRESOLINE) 50 MG tablet Take 50 mg by mouth 2 (two) times a day.   Yes [provider]  levothyroxine (SYNTHROID, LEVOTHROID) 88 MCG tablet Take 88 mcg by mouth daily.  12/05/15  Yes [provider]  MEGARED OMEGA-3 KRILL OIL PO Take 750 mg by mouth daily at 12 noon.   Yes [provider]  metFORMIN (GLUCOPHAGE)  500 MG tablet Take 500 mg by mouth 2 (two) times daily.   Yes [provider]  metoprolol succinate (TOPROL-XL) 25 MG 24 hr tablet Take 1 tablet by mouth daily. 12/04/22 12/04/23 Yes [provider]  olmesartan (BENICAR) 40 MG tablet Take 40 mg by mouth daily.   Yes [provider]  prednisoLONE acetate (PRED FORTE) 1 % ophthalmic suspension Place 1 drop into the right eye 3 (three) times daily. Patient not taking: Reported on 01/27/2023    [provider]    Inpatient  Medications: Scheduled Meds:   stroke: early stages of recovery book   Does not apply Once   insulin aspart  0-15 Units Subcutaneous TID WC   Continuous Infusions:  sodium chloride 50 mL/hr at 01/27/23 0235   PRN Meds: acetaminophen **OR** acetaminophen (TYLENOL) oral liquid 160 mg/5 mL **OR** acetaminophen  Allergies:   No Known Allergies  Social History:   Social History   Socioeconomic History   Marital status: Widowed    Spouse name: Not on file   Number of children: Not on file   Years of education: Not on file   Highest education level: Not on file  Occupational History   Not on file  Tobacco Use   Smoking status: Former    Current packs/day: 0.00    Types: Cigarettes    Quit date: 08/07/1958    Years since quitting: 64.5   Smokeless tobacco: Never  Substance and Sexual Activity   Alcohol use: Not Currently   Drug use: No   Sexual activity: Never  Other Topics Concern   Not on file  Social History Narrative   Not on file   Social Determinants of Health   Financial Resource Strain: Not on file  Food Insecurity: Not on file  Transportation Needs: Not on file  Physical Activity: Not on file  Stress: Not on file  Social Connections: Not on file  Intimate Partner Violence: Not on file    Family History:    Family History  Problem Relation Age of Onset   Breast cancer Neg Hx      ROS:  Please see the history of present illness.   All other ROS reviewed and negative.     Physical Exam/Data:   Vitals:   01/27/23 0600 01/27/23 0630 01/27/23 0744 01/27/23 1000  BP: (!) 148/86 (!) 182/105 (!) 164/103 (!) 164/102  Pulse: 81 (!) 101 93 (!) 107  Resp: (!) 25 19 20 20   Temp:   98.4 F (36.9 C)   TempSrc:   Oral   SpO2: 99% 98% 100% 100%  Height:       No intake or output data in the 24 hours ending 01/27/23 1219    02/25/2019    8:53 AM 02/17/2019    8:35 AM 01/28/2019    9:52 AM  Last 3 Weights  Weight (lbs) 130 lb 130 lb 130 lb 1.1 oz  Weight  (kg) 58.968 kg 58.968 kg 59 kg     Body mass index is 23.78 kg/m.  General:  Well nourished, well developed, in no acute distress HEENT: normal Neck: no JVD Vascular: No carotid bruits; Distal pulses 2+ bilaterally Cardiac:  normal S1, S2; Irreg Irreg; no murmur  Lungs:  clear to auscultation bilaterally, no wheezing, rhonchi or rales  Abd: soft, nontender, no hepatomegaly  Ext: no edema Musculoskeletal:  No deformities, BUE and BLE strength normal and equal Skin: warm and dry  Neuro:  CNs 2-12 intact, no focal  abnormalities noted Psych:  Normal affect   EKG:  The EKG was personally reviewed and demonstrates:  Afib/flutter, 97bpm iRBBB nonspecific ST/T wave changes Telemetry:  Telemetry was personally reviewed and demonstrates:  Afib/flutter HR 90s  Relevant CV Studies:  Echo ordered  Laboratory Data:  High Sensitivity Troponin:  No results for input(s): "TROPONINIHS" in the last 720 hours.   Chemistry Recent Labs  Lab 01/26/23 1627  NA 134*  K 4.5  CL 103  CO2 23  GLUCOSE 193*  BUN 20  CREATININE 0.98  CALCIUM 9.2  GFRNONAA 54*  ANIONGAP 8    Recent Labs  Lab 01/26/23 1627  PROT 7.4  ALBUMIN 4.3  AST 41  ALT 41  ALKPHOS 67  BILITOT 1.4*   Lipids  Recent Labs  Lab 01/27/23 0338  CHOL 122  TRIG 71  HDL 26*  LDLCALC 82  CHOLHDL 4.7    Hematology Recent Labs  Lab 01/26/23 1627 01/27/23 0338  WBC 5.5 4.7  RBC 5.16* 4.48  HGB 14.4 12.9  HCT 44.1 38.2  MCV 85.5 85.3  MCH 27.9 28.8  MCHC 32.7 33.8  RDW 15.0 14.7  PLT 240 208   Thyroid  Recent Labs  Lab 01/26/23 1627  TSH 3.372    BNPNo results for input(s): "BNP", "PROBNP" in the last 168 hours.  DDimer No results for input(s): "DDIMER" in the last 168 hours.   Radiology/Studies:  CT ANGIO HEAD NECK W WO CM  Result Date: 01/27/2023 CLINICAL DATA:  Stroke follow-up EXAM: CT ANGIOGRAPHY HEAD AND NECK WITH AND WITHOUT CONTRAST TECHNIQUE: Multidetector CT imaging of the head and neck was  performed using the standard protocol during bolus administration of intravenous contrast. Multiplanar CT image reconstructions and MIPs were obtained to evaluate the vascular anatomy. Carotid stenosis measurements (when applicable) are obtained utilizing NASCET criteria, using the distal internal carotid diameter as the denominator. RADIATION DOSE REDUCTION: This exam was performed according to the departmental dose-optimization program which includes automated exposure control, adjustment of the mA and/or kV according to patient size and/or use of iterative reconstruction technique. CONTRAST:  75mL OMNIPAQUE IOHEXOL 350 MG/ML SOLN COMPARISON:  Brain MRI from yesterday FINDINGS: CT HEAD FINDINGS Brain: Small acute cortical infarcts are occult when compared to MRI. Extensive chronic small vessel ischemia in the cerebral white matter, deep gray nuclei, and pons. No acute hemorrhage or hydrocephalus Vascular: See below Skull: Negative Sinuses/Orbits: Unremarkable Review of the MIP images confirms the above findings CTA NECK FINDINGS Aortic arch: Atheromatous plaque. 3 vessel branching with left vertebral artery arising from the arch in conjoined carotid origins. Right carotid system: Atheromatous plaque is mild for age at the bifurcation. No stenosis or ulceration. Left carotid system: Mild for age in mainly calcified plaque greatest at the bifurcation without ulceration or flow reducing stenosis. Vertebral arteries: The right vertebral artery is dominant. The left vertebral artery arises from the arch. No proximal subclavian stenosis despite atherosclerosis. The vertebral arteries are smoothly contoured and widely patent. Skeleton: Unremarkable Other neck: No acute finding Upper chest: No acute finding Review of the MIP images confirms the above findings CTA HEAD FINDINGS Anterior circulation: Atheromatous calcification of the carotid siphons. Diffuse atheromatous irregularity of intracranial branches with moderate  moderate left M1 stenosis. There is a supraclinoid right ICA aneurysm projecting superiorly and laterally measuring 3.5 mm on coronal reformats. Posterior circulation: Right dominant vertebral artery. Early branching left PICA. Atheromatous irregularity asymmetrically affects the left vertebral artery but no focal high-grade stenosis. Extensive atheromatous irregularity  of the posterior cerebral arteries with high-grade left P2 stenosis. Fetal type origin of the right PCA. Venous sinuses: Unremarkable in the arterial phase Anatomic variants: As above Review of the MIP images confirms the above findings IMPRESSION: 1. No emergent vascular finding. 2. Atherosclerosis in the neck without ulceration or flow reducing stenosis. 3. Even greater intracranial atherosclerosis with moderate left MCA and and advanced left PCA stenoses. 4. 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 Electronically Signed   By: Tiburcio Pea M.D.   On: 01/27/2023 10:23   MR Brain Wo Contrast (neuro protocol)  Result Date: 01/26/2023 CLINICAL DATA:  Initial evaluation for mental status change, unknown cause. EXAM: MRI HEAD WITHOUT CONTRAST TECHNIQUE: Multiplanar, multiecho pulse sequences of the brain and surrounding structures were obtained without intravenous contrast. COMPARISON:  Prior CT from earlier the same day. FINDINGS: Brain: Examination mildly degraded by motion artifact. Diffuse prominence of the CSF containing spaces compatible generalized age-related cerebral atrophy. Patchy and confluent T2/FLAIR hyperintensity involving the periventricular and deep white matter both cerebral hemispheres as well as the pons, consistent with chronic small vessel ischemic disease, moderate to advanced in nature. Few small remote infarcts noted about the right greater than left cerebellum. Small remote lacunar infarct noted within the left pons. Patchy small volume diffusion signal abnormality is seen involving the cortical gray matter of the left  occipital lobe (series 13, image 25). Additional patchy small volume diffusion signal abnormality noted at the contralateral right occipital lobe (series 13, image 30). Mild patchy involvement of the right posterior splenium (series 13, image 26). Findings consistent with small acute to subacute ischemic infarcts. Associated mild petechial blood products noted on SWI sequence (series 17, images 33, 38). No significant mass effect. No other evidence for acute or subacute ischemia. No other acute intracranial hemorrhage. Few additional punctate chronic micro hemorrhages noted elsewhere, likely small vessel related. No mass lesion, midline shift or mass effect. Mild ventricular prominence related to global parenchymal volume loss of hydrocephalus. No extra-axial fluid collection. Pituitary gland and suprasellar region within normal limits. Vascular: Major intracranial vascular flow voids are maintained. Skull and upper cervical spine: Craniocervical junction with normal limits. Bone marrow signal intensity normal. No scalp soft tissue abnormality. Sinuses/Orbits: Prior bilateral ocular lens replacement. Paranasal sinuses are largely clear. No significant mastoid effusion. Other: None. IMPRESSION: 1. Patchy small volume acute to early subacute ischemic infarcts involving the left greater than right occipital lobes and right posterior splenium. Associated mild petechial blood products without frank hemorrhagic transformation or significant mass effect. 2. Underlying age-related cerebral atrophy with moderate to advanced chronic microvascular ischemic disease, with a few small remote infarcts involving the right greater than left cerebellum and left pons. Electronically Signed   By: Rise Mu M.D.   On: 01/26/2023 21:44   DG Chest Portable 1 View  Result Date: 01/26/2023 CLINICAL DATA:  Altered mental status. EXAM: PORTABLE CHEST 1 VIEW COMPARISON:  None Available. FINDINGS: 1644 hours. The heart size is at  the upper limits of normal for AP technique. There is aortic atherosclerosis and mitral annular calcification. Mild interstitial prominence throughout the lungs without definite edema. No focal airspace disease, pleural effusion or pneumothorax. No acute osseous findings are evident. IMPRESSION: Borderline heart size with mild interstitial prominence. No definite edema or focal airspace disease. Electronically Signed   By: Carey Bullocks M.D.   On: 01/26/2023 17:30   CT Head Wo Contrast  Result Date: 01/26/2023 CLINICAL DATA:  Delirium EXAM: CT HEAD WITHOUT CONTRAST TECHNIQUE: Contiguous  axial images were obtained from the base of the skull through the vertex without intravenous contrast. RADIATION DOSE REDUCTION: This exam was performed according to the departmental dose-optimization program which includes automated exposure control, adjustment of the mA and/or kV according to patient size and/or use of iterative reconstruction technique. COMPARISON:  None Available. FINDINGS: Brain: No evidence of acute infarction, hemorrhage, hydrocephalus, extra-axial collection or mass lesion/mass effect. There is moderate diffuse atrophy. There is moderate periventricular and deep white matter hypodensity, likely chronic small vessel ischemic change. Vascular: Atherosclerotic calcifications are present within the cavernous internal carotid arteries. Skull: Normal. Negative for fracture or focal lesion. Sinuses/Orbits: No acute finding. Other: None. IMPRESSION: 1. No acute intracranial process. 2. Moderate atrophy and chronic small vessel ischemic changes. Electronically Signed   By: Darliss Cheney M.D.   On: 01/26/2023 17:23     Assessment and Plan:   Acute stroke - MRI brain showed acute to subacute ischemic infarcts in the occipital lobes and posterior splenium - neurology following - out of window for TPN - started on ASA and Plavix - also found to have new onset Afib. She will likely need long-term Eliquis -  permissive HTN - order echo  New onset Afib - found to be in Afib/flutter with controlled rates in the 90s - unclear chronicity of Afib - now with permissive HTN>would start BB when able - CHADSVASC at least 7 (stroke x 2, age x 2, female, HTN, DM2) - she will require long term anticoagulation with Eliquis . She would qualify for low dose given age and weight - check echo - can possible undergo DCCV as outpatient after 3-4 weeks of anticoagulation. May need to see EP  Hypothyroidism - TSH wnl on synthroid PTA  For questions or updates, please contact [facility] Please consult [email or web address] for contact info under    Signed,  David Stall, PA-C  01/27/2023 12:19 PM

## 2023-01-27 NOTE — Evaluation (Signed)
Physical Therapy Evaluation Patient Details Name: Barbara Butler MRN: [id number] DOB: 08/09/1929 Today's Date: 01/27/2023  History of Present Illness  89 y/o female presented to ED on 01/26/23 for new onset of confusion. 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 Found to be in Afib/Aflutter. PMH: HTN, T2DM, anemia, CAD  Clinical Impression  Patient admitted with the above. PTA, patient lives alone and reports independence with no AD but endorses multiple stumbles. Patient oriented to self and place, unable to correctly state situation and reports "1943" for year. Patient presents with weakness, impaired balance, and decreased activity tolerance. Patient required min guard for sit to stand and short ambulation within room with HHAx1. VSS on RA. Patient will benefit from skilled PT services during acute stay to address listed deficits. Patient will benefit from ongoing therapy at discharge to maximize functional independence and safety. Will likely need frequent supervision if returning home.       Assistance Recommended at Discharge Frequent or constant Supervision/Assistance  If plan is discharge home, recommend the following:  Can travel by private vehicle  A little help with walking and/or transfers;A little help with bathing/dressing/bathroom;Assistance with cooking/housework;Direct supervision/assist for medications management;Direct supervision/assist for financial management;Assist for transportation;Help with stairs or ramp for entrance        Equipment Recommendations Rolling  (2 wheels)  Recommendations for Other Services       Functional Status Assessment Patient has had a recent decline in their functional status and demonstrates the ability to make significant improvements in function in a reasonable and predictable amount of time.     Precautions / Restrictions Precautions Precautions: Fall Restrictions Weight Bearing Restrictions: No       Mobility  Bed Mobility Overal bed mobility: Modified Independent             General bed mobility comments: with extra time    Transfers Overall transfer level: Needs assistance Equipment used: 1 person hand held assist Transfers: Sit to/from Stand Sit to Stand: Supervision, Min guard                Ambulation/Gait Ambulation/Gait assistance: Min guard Gait Distance (Feet): 12 Feet (+12') Assistive device: 1 person hand held assist Gait Pattern/deviations: Step-through pattern, Decreased stride length Gait velocity: decreased     General Gait Details: min guard for safety. Mild balance deficits noted. Use of HHA for support. Ambulated within room to/from commode. Transport present to take patient to CT  Stairs            Wheelchair Mobility     Tilt Bed    Modified Rankin (Stroke Patients Only)       Balance Overall balance assessment: Mild deficits observed, not formally tested                                           Pertinent Vitals/Pain Pain Assessment Pain Assessment: No/denies pain    Home Living Family/patient expects to be discharged to:: Private residence Living Arrangements: Alone Available Help at Discharge: Family;Available PRN/intermittently (pt states she has family in the area; is not very specific about where or how much assist they can give) Type of Home: House Home Access: Stairs to enter Entrance Stairs-Rails: Right;Left;Can reach both Entrance Stairs-Number of Steps: 4   Home Layout: One level Home Equipment: None Additional Comments: Need to verify home set up and prior level with family or  friends    Prior Function Prior Level of Function : Independent/Modified Independent;Driving             Mobility Comments: Pt states she does not use AD "but maybe I need to"; denies falls. ADLs Comments: States she is (I) with ADLs and also drives and grocery shops     Hand Dominance   Dominant  Hand:  (did not explicitly ask; pt using L hand for toilet hygiene today)    Extremity/Trunk Assessment   Upper Extremity Assessment Upper Extremity Assessment: Defer to OT evaluation    Lower Extremity Assessment Lower Extremity Assessment: Generalized weakness       Communication   Communication: No difficulties  Cognition Arousal/Alertness: Awake/alert Behavior During Therapy: WFL for tasks assessed/performed Overall Cognitive Status: Impaired/Different from baseline Area of Impairment: Orientation, Memory, Awareness, Problem solving                 Orientation Level: Disoriented to, Situation, Time   Memory: Decreased short-term memory     Awareness: Emergent Problem Solving: Slow processing General Comments: Pt is oriented to person, place ("hospital"), grossly to time (knows that it is July; unable to answer the year correctly even with multiple attempts several times during session; pt ultimately states it is 13; knows she is born in [birth year]), knows that she 'didn't feel well" at the hospital. Pt is aware that the clock on the wall in her room is not working and asks what time it is.        General Comments General comments (skin integrity, edema, etc.): Pt on room air; agreeable throughout session; no signs of shortness of breath; no complaint of dizziness.    Exercises     Assessment/Plan    PT Assessment Patient needs continued PT services  PT Problem List Decreased strength;Decreased activity tolerance;Decreased balance;Decreased mobility;Decreased cognition;Decreased knowledge of use of DME;Decreased safety awareness;Decreased knowledge of precautions;Cardiopulmonary status limiting activity       PT Treatment Interventions DME instruction;Gait training;Functional mobility training;Therapeutic activities;Stair training;Therapeutic exercise;Balance training;Patient/family education    PT Goals (Current goals can be found in the Care Plan section)  Acute  Rehab PT Goals Patient Stated Goal: to go home PT Goal Formulation: Patient unable to participate in goal setting Time For Goal Achievement: 02/10/23 Potential to Achieve Goals: Good    Frequency Min 1X/week     Co-evaluation               AM-PAC PT "6 Clicks" Mobility  Outcome Measure Help needed turning from your back to your side while in a flat bed without using bedrails?: None Help needed moving from lying on your back to sitting on the side of a flat bed without using bedrails?: None Help needed moving to and from a bed to a chair (including a wheelchair)?: A Little Help needed standing up from a chair using your arms (e.g., wheelchair or bedside chair)?: A Little Help needed to walk in hospital room?: A Little Help needed climbing 3-5 steps with a railing? : A Lot 6 Click Score: 19    End of Session   Activity Tolerance: Patient tolerated treatment well Patient left: in bed;with call bell/phone within reach Nurse Communication: Mobility status PT Visit Diagnosis: Unsteadiness on feet (R26.81);Muscle weakness (generalized) (M62.81);Other abnormalities of gait and mobility (R26.89)    Time: 1308-6578 PT Time Calculation (min) (ACUTE ONLY): 13 min   Charges:   PT Evaluation $PT Eval Moderate Complexity: 1 Mod   PT General Charges $$ ACUTE  PT VISIT: 1 Visit         Maylon Peppers, PT, DPT Physical Therapist - [facility]  [facility]    A  01/27/2023, 12:18 PM

## 2023-01-27 NOTE — ED Notes (Signed)
This RN sent Dr. Nelson Chimes a message in regard to discontinuing discharge order as patient is not set to be discharged to family until 01/28/23. Dr. Nelson Chimes wants order to stay in place.

## 2023-01-27 NOTE — Hospital Course (Addendum)
Taken from H&P and prior notes.   BARBIE CROSTON is a 89 y.o. female with medical history significant for hypertension, diabetes mellitus type 2, hypothyroidism, anemia presenting with intermittent disorientation.Last known normal was yesterday at about 11 AM.   ED course.  Vitals mostly stable, labs mostly unremarkable with mildly elevated T. bili at 1.4.  EKG with concern of A-fib/a flutter.  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  Associated with mild petechial blood products without frank hemorrhagic transformation or mass effect.  Concern of embolic phenomena.  Teleneurology was consulted and they were recommending DAPT with aspirin and Plavix for now and transition to Eliquis on day 4.  7/22: Blood pressure at 164/103.  Lipid panel mostly normal with HDL of 26 and LDL of 82.  TSH 3.372, CBG 156.  Passed swallow evaluation.  CTA head and neck was negative for any significant large vessel stenosis. Some intracranial atherosclerosis with moderate left MCA and advanced left PCA stenosis.  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  PT and OT recommended home health services which were ordered. CHADSVASC at least 7 , cardiology can consider DCCV as outpatient in 3 to 4 weeks after sufficient anticoagulation.  Will continue home Toprol at 25 mg daily.  Patient will be started on Eliquis after 3 days as recommended by neurology.  Echocardiogram with hyperdynamic EF and indeterminate diastolic function.  Patient remained stable and at baseline.  No neurologic deficit.  She has been given aspirin for 3 days and then she will start Eliquis from 01/30/2023 as recommended by neurology.  Patient will continue on current medications and need to have a close follow-up with neurology and cardiology for further  recommendations.  7/23: Patient was discharged yesterday after completing the workup of stroke.  Remained at baseline.  Family initially said that they need 1 more night so they can make arrangements.  Home health services were ordered and are in placed.  This morning daughter was becoming extremely rude with myself and with other providers especially PT and accusing [place] that we did not did well and told them that she will be staying in the hospital for at least 4 days, this type of message was never communicated.  She was told in the morning rounds that it depends on PT evaluation whether she will be going to rehab or to home health after completing stroke workup.  Daughter was keeps accusing that her mother lives alone and how we can discharge her. PT reevaluated her, patient is able to walk but the change the recommendation to rehab based on no supervision at home.  TOC started the workup, once she had a bed we will try getting insurance authorization and at that point it will depend on insurance.  Home antihypertensives were restarted as she is now out of window for permissive hypertension.

## 2023-01-27 NOTE — ED Notes (Signed)
PT and OT at bedside with patient at this time.

## 2023-01-27 NOTE — Discharge Summary (Signed)
Physician Discharge Summary   Patient: Barbara Butler MRN: [id number] DOB: 1929/12/17  Admit date:     01/26/2023  Discharge date: 01/27/23  Discharge Physician: Arnetha Courser   PCP: [name], MD   Recommendations at discharge:  Please obtain CBC and BMP within a week Patient is being started on Eliquis for new diagnosis of atrial flutter, she will start from 7/25 and should stop aspirin at that time. Follow-up with cardiology Follow-up with neurology Follow-up with primary care provider  Discharge Diagnoses: Principal Problem:   Confusion and disorientation / subacute cva. Active Problems:   Type 2 diabetes mellitus with hyperglycemia, without long-term current use of insulin (HCC)   Hypertension   Hypothyroidism   Cerebrovascular accident (CVA) [facility])   Hospital Course: Taken from H&P and prior notes.   Barbara Butler is a 89 y.o. female with medical history significant for hypertension, diabetes mellitus type 2, hypothyroidism, anemia presenting with intermittent disorientation.Last known normal was yesterday at about 11 AM.   ED course.  Vitals mostly stable, labs mostly unremarkable with mildly elevated T. bili at 1.4.  EKG with concern of A-fib/a flutter.  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  Associated with mild petechial blood products without frank hemorrhagic transformation or mass effect.  Concern of embolic phenomena.  Teleneurology was consulted and they were recommending DAPT with aspirin and Plavix for now and transition to Eliquis on day 4.  7/22: Blood pressure at 164/103.  Lipid panel mostly normal with HDL of 26 and LDL of 82.  TSH 3.372, CBG 156.  Passed swallow evaluation.  CTA head and neck was negative for any significant large vessel stenosis. Some intracranial atherosclerosis  with moderate left MCA and advanced left PCA stenosis.  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  PT and OT recommended home health services which were ordered. CHADSVASC at least 7 , cardiology can consider DCCV as outpatient in 3 to 4 weeks after sufficient anticoagulation.  Will continue home Toprol at 25 mg daily.  Patient will be started on Eliquis after 3 days as recommended by neurology.  Echocardiogram with hyperdynamic EF and indeterminate diastolic function.  Patient remained stable and at baseline.  No neurologic deficit.  She has been given aspirin for 3 days and then she will start Eliquis from 01/30/2023 as recommended by neurology.  Patient will continue on current medications and need to have a close follow-up with neurology and cardiology for further recommendations.  Consultants: Neurology.  Cardiology Procedures performed: None Disposition: Home health Diet recommendation:  Discharge Diet Orders (From admission, onward)     Start     Ordered   01/27/23 0000  Diet - low sodium heart healthy        01/27/23 1748           Cardiac and Carb modified diet DISCHARGE MEDICATION: Allergies as of 01/27/2023   No Known Allergies      Medication List     STOP taking these medications    prednisoLONE acetate 1 % ophthalmic suspension Commonly known as: PRED FORTE       TAKE these medications    amLODipine 5 MG tablet Commonly known as: NORVASC Take 5 mg by mouth daily.   apixaban 2.5 MG Tabs tablet Commonly known as: Eliquis Take 1 tablet (2.5 mg total) by mouth  2 (two) times daily.   aspirin EC 81 MG tablet Take 81 mg by mouth daily at 12 noon.   cholecalciferol 25 MCG (1000 UNIT) tablet Commonly known as: VITAMIN D3 Take 1,000 Units by mouth daily at 12 noon.   hydrALAZINE 50 MG tablet Commonly known as: APRESOLINE Take 50 mg by mouth 2 (two) times a day.   levothyroxine 88 MCG tablet Commonly known as: SYNTHROID Take 88  mcg by mouth daily.   MEGARED OMEGA-3 KRILL OIL PO Take 750 mg by mouth daily at 12 noon.   metFORMIN 500 MG tablet Commonly known as: GLUCOPHAGE Take 500 mg by mouth 2 (two) times daily.   metoprolol succinate 25 MG 24 hr tablet Commonly known as: TOPROL-XL Take 1 tablet by mouth daily.   olmesartan 40 MG tablet Commonly known as: BENICAR Take 40 mg by mouth daily.   vitamin C 1000 MG tablet Take 1,000 mg by mouth daily at 12 noon.               Durable Medical Equipment  (From admission, onward)           Start     Ordered   01/27/23 1627  For home use only DME Walker rolling  Once       Question Answer Comment  Walker: With 5 Inch Wheels   Patient needs a walker to treat with the following condition 1 minute Apgar score 0   Patient needs a walker to treat with the following condition Weakness      01/27/23 1637            Follow-up Information     Hande, Roderic Palau, MD. Schedule an appointment as soon as possible for a visit in 1 week(s).   Specialty: Internal Medicine Contact information: [address] [place] [address] [phone]         Iran Ouch, MD. Schedule an appointment as soon as possible for a visit.   Specialty: Cardiology Contact information: [address] [place] [address] [phone]         Lonell Face, MD. Schedule an appointment as soon as possible for a visit.   Specialty: Neurology Contact information: [address] [facility] [place] [address] [phone]                Discharge Exam: There were no vitals filed for this visit. General.  Frail elderly lady, in no acute distress. Pulmonary.  Lungs clear bilaterally, normal respiratory effort. CV.  Regular rate and rhythm, no JVD, rub or murmur. Abdomen.  Soft, nontender, nondistended, BS positive. CNS.  Alert and oriented .  No focal neurologic  deficit. Extremities.  No edema, no cyanosis, pulses intact and symmetrical. Psychiatry.  Judgment and insight appears normal.   Condition at discharge: stable  The results of significant diagnostics from this hospitalization (including imaging, microbiology, ancillary and laboratory) are listed below for reference.   Imaging Studies: ECHOCARDIOGRAM COMPLETE  Result Date: 01/27/2023    ECHOCARDIOGRAM REPORT   Patient Name:   [name] Date of Exam: 01/27/2023 Medical Rec #:  [id number]        Height:       62.0 in Accession #:    [id number]       Weight:       130.0 lb Date of Birth:  1929-08-22         BSA:  1.592 m Patient Age:    89 years         BP:           164/102 mmHg Patient Gender: F                HR:           87 bpm. Exam Location:  [facility] Procedure: 2D Echo, Cardiac Doppler and Color Doppler Indications:     Stroke  History:         Patient has no prior history of Echocardiogram examinations.                  Stroke; Risk Factors:Hypertension and Diabetes.  Sonographer:     Mikki Harbor Referring Phys:  [id number] Eliezer Mccoy PATEL Diagnosing Phys: Lorine Bears MD IMPRESSIONS  1. Left ventricular ejection fraction, by estimation, is 70 to 75%. The left ventricle has hyperdynamic function. The left ventricle has no regional wall motion abnormalities. There is moderate asymmetric left ventricular hypertrophy of the basal-septal  segment. Left ventricular diastolic parameters are indeterminate.  2. Right ventricular systolic function is normal. The right ventricular size is normal. There is normal pulmonary artery systolic pressure.  3. Left atrial size was severely dilated.  4. Right atrial size was moderately dilated.  5. The mitral valve is degenerative. Mild to moderate mitral valve regurgitation. No evidence of mitral stenosis. Moderate mitral annular calcification.  6. Tricuspid valve regurgitation is moderate.  7. The aortic valve is calcified. Aortic valve regurgitation is mild.  Aortic valve sclerosis/calcification is present, without any evidence of aortic stenosis.  8. The inferior vena cava is normal in size with greater than 50% respiratory variability, suggesting right atrial pressure of 3 mmHg. FINDINGS  Left Ventricle: Left ventricular ejection fraction, by estimation, is 70 to 75%. The left ventricle has hyperdynamic function. The left ventricle has no regional wall motion abnormalities. The left ventricular internal cavity size was normal in size. There is moderate asymmetric left ventricular hypertrophy of the basal-septal segment. Left ventricular diastolic parameters are indeterminate. Right Ventricle: The right ventricular size is normal. No increase in right ventricular wall thickness. Right ventricular systolic function is normal. There is normal pulmonary artery systolic pressure. The tricuspid regurgitant velocity is 2.17 m/s, and  with an assumed right atrial pressure of 5 mmHg, the estimated right ventricular systolic pressure is 23.8 mmHg. Left Atrium: Left atrial size was severely dilated. Right Atrium: Right atrial size was moderately dilated. Pericardium: There is no evidence of pericardial effusion. Mitral Valve: The mitral valve is degenerative in appearance. There is moderate thickening of the mitral valve leaflet(s). There is moderate calcification of the mitral valve leaflet(s). Moderate mitral annular calcification. Mild to moderate mitral valve regurgitation. No evidence of mitral valve stenosis. MV peak gradient, 5.3 mmHg. The mean mitral valve gradient is 2.0 mmHg. Tricuspid Valve: The tricuspid valve is normal in structure. Tricuspid valve regurgitation is moderate . No evidence of tricuspid stenosis. Aortic Valve: The aortic valve is calcified. Aortic valve regurgitation is mild. Aortic regurgitation PHT measures 508 msec. Aortic valve sclerosis/calcification is present, without any evidence of aortic stenosis. Aortic valve mean gradient measures 2.5  mmHg.  Aortic valve peak gradient measures 6.0 mmHg. Aortic valve area, by VTI measures 2.16 cm. Pulmonic Valve: The pulmonic valve was normal in structure. Pulmonic valve regurgitation is mild. No evidence of pulmonic stenosis. Aorta: The aortic root is normal in size and structure. Venous: The inferior vena cava is normal in size  with greater than 50% respiratory variability, suggesting right atrial pressure of 3 mmHg. IAS/Shunts: No atrial level shunt detected by color flow Doppler.  LEFT VENTRICLE PLAX 2D LVIDd:         3.80 cm LVIDs:         1.80 cm LV PW:         0.80 cm LV IVS:        1.20 cm LVOT diam:     1.90 cm LV SV:         37 LV SV Index:   23 LVOT Area:     2.84 cm  RIGHT VENTRICLE RV Basal diam:  3.15 cm RV Mid diam:    2.90 cm RV S prime:     11.50 cm/s LEFT ATRIUM              Index        RIGHT ATRIUM           Index LA diam:        4.50 cm  2.83 cm/m   RA Area:     24.20 cm LA Vol (A2C):   124.0 ml 77.90 ml/m  RA Volume:   71.80 ml  45.10 ml/m LA Vol (A4C):   92.5 ml  58.11 ml/m LA Biplane Vol: 108.0 ml 67.84 ml/m  AORTIC VALVE                    PULMONIC VALVE AV Area (Vmax):    2.04 cm     PV Vmax:       0.80 m/s AV Area (Vmean):   2.11 cm     PV Peak grad:  2.6 mmHg AV Area (VTI):     2.16 cm AV Vmax:           122.50 cm/s AV Vmean:          69.200 cm/s AV VTI:            0.172 m AV Peak Grad:      6.0 mmHg AV Mean Grad:      2.5 mmHg LVOT Vmax:         88.20 cm/s LVOT Vmean:        51.400 cm/s LVOT VTI:          0.132 m LVOT/AV VTI ratio: 0.76 AI PHT:            508 msec  AORTA Ao Root diam: 3.00 cm Ao Asc diam:  3.30 cm MITRAL VALVE               TRICUSPID VALVE MV Area (PHT): 4.21 cm    TR Peak grad:   18.8 mmHg MV Area VTI:   1.92 cm    TR Vmax:        217.00 cm/s MV Peak grad:  5.3 mmHg MV Mean grad:  2.0 mmHg    SHUNTS MV Vmax:       1.15 m/s    Systemic VTI:  0.13 m MV Vmean:      56.8 cm/s   Systemic Diam: 1.90 cm MV Decel Time: 180 msec MV E velocity: 94.90 cm/s Lorine Bears  MD Electronically signed by Lorine Bears MD Signature Date/Time: 01/27/2023/2:39:55 PM    Final    CT ANGIO HEAD NECK W WO CM  Result Date: 01/27/2023 CLINICAL DATA:  Stroke follow-up EXAM: CT ANGIOGRAPHY HEAD AND NECK WITH AND WITHOUT CONTRAST TECHNIQUE: Multidetector CT imaging of the head and neck was  performed using the standard protocol during bolus administration of intravenous contrast. Multiplanar CT image reconstructions and MIPs were obtained to evaluate the vascular anatomy. Carotid stenosis measurements (when applicable) are obtained utilizing NASCET criteria, using the distal internal carotid diameter as the denominator. RADIATION DOSE REDUCTION: This exam was performed according to the departmental dose-optimization program which includes automated exposure control, adjustment of the mA and/or kV according to patient size and/or use of iterative reconstruction technique. CONTRAST:  75mL OMNIPAQUE IOHEXOL 350 MG/ML SOLN COMPARISON:  Brain MRI from yesterday FINDINGS: CT HEAD FINDINGS Brain: Small acute cortical infarcts are occult when compared to MRI. Extensive chronic small vessel ischemia in the cerebral white matter, deep gray nuclei, and pons. No acute hemorrhage or hydrocephalus Vascular: See below Skull: Negative Sinuses/Orbits: Unremarkable Review of the MIP images confirms the above findings CTA NECK FINDINGS Aortic arch: Atheromatous plaque. 3 vessel branching with left vertebral artery arising from the arch in conjoined carotid origins. Right carotid system: Atheromatous plaque is mild for age at the bifurcation. No stenosis or ulceration. Left carotid system: Mild for age in mainly calcified plaque greatest at the bifurcation without ulceration or flow reducing stenosis. Vertebral arteries: The right vertebral artery is dominant. The left vertebral artery arises from the arch. No proximal subclavian stenosis despite atherosclerosis. The vertebral arteries are smoothly contoured and  widely patent. Skeleton: Unremarkable Other neck: No acute finding Upper chest: No acute finding Review of the MIP images confirms the above findings CTA HEAD FINDINGS Anterior circulation: Atheromatous calcification of the carotid siphons. Diffuse atheromatous irregularity of intracranial branches with moderate moderate left M1 stenosis. There is a supraclinoid right ICA aneurysm projecting superiorly and laterally measuring 3.5 mm on coronal reformats. Posterior circulation: Right dominant vertebral artery. Early branching left PICA. Atheromatous irregularity asymmetrically affects the left vertebral artery but no focal high-grade stenosis. Extensive atheromatous irregularity of the posterior cerebral arteries with high-grade left P2 stenosis. Fetal type origin of the right PCA. Venous sinuses: Unremarkable in the arterial phase Anatomic variants: As above Review of the MIP images confirms the above findings IMPRESSION: 1. No emergent vascular finding. 2. Atherosclerosis in the neck without ulceration or flow reducing stenosis. 3. Even greater intracranial atherosclerosis with moderate left MCA and and advanced left PCA stenoses. 4. 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 Electronically Signed   By: Tiburcio Pea M.D.   On: 01/27/2023 10:23   MR Brain Wo Contrast (neuro protocol)  Result Date: 01/26/2023 CLINICAL DATA:  Initial evaluation for mental status change, unknown cause. EXAM: MRI HEAD WITHOUT CONTRAST TECHNIQUE: Multiplanar, multiecho pulse sequences of the brain and surrounding structures were obtained without intravenous contrast. COMPARISON:  Prior CT from earlier the same day. FINDINGS: Brain: Examination mildly degraded by motion artifact. Diffuse prominence of the CSF containing spaces compatible generalized age-related cerebral atrophy. Patchy and confluent T2/FLAIR hyperintensity involving the periventricular and deep white matter both cerebral hemispheres as well as the pons, consistent  with chronic small vessel ischemic disease, moderate to advanced in nature. Few small remote infarcts noted about the right greater than left cerebellum. Small remote lacunar infarct noted within the left pons. Patchy small volume diffusion signal abnormality is seen involving the cortical gray matter of the left occipital lobe (series 13, image 25). Additional patchy small volume diffusion signal abnormality noted at the contralateral right occipital lobe (series 13, image 30). Mild patchy involvement of the right posterior splenium (series 13, image 26). Findings consistent with small acute to subacute ischemic infarcts. Associated mild petechial  blood products noted on SWI sequence (series 17, images 33, 38). No significant mass effect. No other evidence for acute or subacute ischemia. No other acute intracranial hemorrhage. Few additional punctate chronic micro hemorrhages noted elsewhere, likely small vessel related. No mass lesion, midline shift or mass effect. Mild ventricular prominence related to global parenchymal volume loss of hydrocephalus. No extra-axial fluid collection. Pituitary gland and suprasellar region within normal limits. Vascular: Major intracranial vascular flow voids are maintained. Skull and upper cervical spine: Craniocervical junction with normal limits. Bone marrow signal intensity normal. No scalp soft tissue abnormality. Sinuses/Orbits: Prior bilateral ocular lens replacement. Paranasal sinuses are largely clear. No significant mastoid effusion. Other: None. IMPRESSION: 1. Patchy small volume acute to early subacute ischemic infarcts involving the left greater than right occipital lobes and right posterior splenium. Associated mild petechial blood products without frank hemorrhagic transformation or significant mass effect. 2. Underlying age-related cerebral atrophy with moderate to advanced chronic microvascular ischemic disease, with a few small remote infarcts involving the right  greater than left cerebellum and left pons. Electronically Signed   By: Rise Mu M.D.   On: 01/26/2023 21:44   DG Chest Portable 1 View  Result Date: 01/26/2023 CLINICAL DATA:  Altered mental status. EXAM: PORTABLE CHEST 1 VIEW COMPARISON:  None Available. FINDINGS: 1644 hours. The heart size is at the upper limits of normal for AP technique. There is aortic atherosclerosis and mitral annular calcification. Mild interstitial prominence throughout the lungs without definite edema. No focal airspace disease, pleural effusion or pneumothorax. No acute osseous findings are evident. IMPRESSION: Borderline heart size with mild interstitial prominence. No definite edema or focal airspace disease. Electronically Signed   By: Carey Bullocks M.D.   On: 01/26/2023 17:30   CT Head Wo Contrast  Result Date: 01/26/2023 CLINICAL DATA:  Delirium EXAM: CT HEAD WITHOUT CONTRAST TECHNIQUE: Contiguous axial images were obtained from the base of the skull through the vertex without intravenous contrast. RADIATION DOSE REDUCTION: This exam was performed according to the departmental dose-optimization program which includes automated exposure control, adjustment of the mA and/or kV according to patient size and/or use of iterative reconstruction technique. COMPARISON:  None Available. FINDINGS: Brain: No evidence of acute infarction, hemorrhage, hydrocephalus, extra-axial collection or mass lesion/mass effect. There is moderate diffuse atrophy. There is moderate periventricular and deep white matter hypodensity, likely chronic small vessel ischemic change. Vascular: Atherosclerotic calcifications are present within the cavernous internal carotid arteries. Skull: Normal. Negative for fracture or focal lesion. Sinuses/Orbits: No acute finding. Other: None. IMPRESSION: 1. No acute intracranial process. 2. Moderate atrophy and chronic small vessel ischemic changes. Electronically Signed   By: Darliss Cheney M.D.   On:  01/26/2023 17:23    Microbiology: Results for orders placed or performed during the hospital encounter of 01/26/23  Resp Panel by RT-PCR (Flu A&B, Covid) Anterior Nasal Swab     Status: None   Collection Time: 01/26/23  4:09 PM   Specimen: Anterior Nasal Swab  Result Value Ref Range Status   SARS Coronavirus 2 by RT PCR NEGATIVE NEGATIVE Final    Comment: (NOTE) SARS-CoV-2 target nucleic acids are NOT DETECTED.  The SARS-CoV-2 RNA is generally detectable in upper respiratory specimens during the acute phase of infection. The lowest concentration of SARS-CoV-2 viral copies this assay can detect is 138 copies/mL. A negative result does not preclude SARS-Cov-2 infection and should not be used as the sole basis for treatment or other patient management decisions. A negative result may occur with  improper specimen collection/handling, submission of specimen other than nasopharyngeal swab, presence of viral mutation(s) within the areas targeted by this assay, and inadequate number of viral copies(<138 copies/mL). A negative result must be combined with clinical observations, patient history, and epidemiological information. The expected result is Negative.  Fact Sheet for Patients:  BloggerCourse.com  Fact Sheet for Healthcare Providers:  SeriousBroker.it  This test is no t yet approved or cleared by the [place] FDA and  has been authorized for detection and/or diagnosis of SARS-CoV-2 by FDA under an Emergency Use Authorization (EUA). This EUA will remain  in effect (meaning this test can be used) for the duration of the COVID-19 declaration under Section 564(b)(1) of the Act, 21 U.S.C.section 360bbb-3(b)(1), unless the authorization is terminated  or revoked sooner.       Influenza A by PCR NEGATIVE NEGATIVE Final   Influenza B by PCR NEGATIVE NEGATIVE Final    Comment: (NOTE) The Xpert Xpress SARS-CoV-2/FLU/RSV plus assay  is intended as an aid in the diagnosis of influenza from Nasopharyngeal swab specimens and should not be used as a sole basis for treatment. Nasal washings and aspirates are unacceptable for Xpert Xpress SARS-CoV-2/FLU/RSV testing.  Fact Sheet for Patients: BloggerCourse.com  Fact Sheet for Healthcare Providers: SeriousBroker.it  This test is not yet approved or cleared by the [place] FDA and has been authorized for detection and/or diagnosis of SARS-CoV-2 by FDA under an Emergency Use Authorization (EUA). This EUA will remain in effect (meaning this test can be used) for the duration of the COVID-19 declaration under Section 564(b)(1) of the Act, 21 U.S.C. section 360bbb-3(b)(1), unless the authorization is terminated or revoked.  Performed at [facility], [address] [place], [place] [address]     Labs: CBC: Recent Labs  Lab 01/26/23 1627 01/27/23 0338  WBC 5.5 4.7  NEUTROABS 3.9  --   HGB 14.4 12.9  HCT 44.1 38.2  MCV 85.5 85.3  PLT 240 208   Basic Metabolic Panel: Recent Labs  Lab 01/26/23 1627  NA 134*  K 4.5  CL 103  CO2 23  GLUCOSE 193*  BUN 20  CREATININE 0.98  CALCIUM 9.2   Liver Function Tests: Recent Labs  Lab 01/26/23 1627  AST 41  ALT 41  ALKPHOS 67  BILITOT 1.4*  PROT 7.4  ALBUMIN 4.3   CBG: Recent Labs  Lab 01/26/23 1623 01/27/23 0742 01/27/23 1210 01/27/23 1704  GLUCAP 184* 156* 233* 69*    Discharge time spent: greater than 30 minutes.  This record has been created using Conservation officer, historic buildings. Errors have been sought and corrected,but may not always be located. Such creation errors do not reflect on the standard of care.   Signed: Arnetha Courser, MD [facility] 01/27/2023

## 2023-01-28 ENCOUNTER — Other Ambulatory Visit (HOSPITAL_COMMUNITY): Payer: Self-pay

## 2023-01-28 DIAGNOSIS — I4892 Unspecified atrial flutter: Secondary | ICD-10-CM

## 2023-01-28 DIAGNOSIS — I4891 Unspecified atrial fibrillation: Secondary | ICD-10-CM | POA: Diagnosis not present

## 2023-01-28 DIAGNOSIS — I639 Cerebral infarction, unspecified: Secondary | ICD-10-CM | POA: Diagnosis not present

## 2023-01-28 DIAGNOSIS — E039 Hypothyroidism, unspecified: Secondary | ICD-10-CM | POA: Diagnosis not present

## 2023-01-28 DIAGNOSIS — R410 Disorientation, unspecified: Secondary | ICD-10-CM | POA: Diagnosis not present

## 2023-01-28 LAB — CBG MONITORING, ED
Glucose-Capillary: 180 mg/dL — ABNORMAL HIGH (ref 70–99)
Glucose-Capillary: 298 mg/dL — ABNORMAL HIGH (ref 70–99)
Glucose-Capillary: 67 mg/dL — ABNORMAL LOW (ref 70–99)
Glucose-Capillary: 101 mg/dL — ABNORMAL HIGH (ref 70–99)
Glucose-Capillary: 160 mg/dL — ABNORMAL HIGH (ref 70–99)

## 2023-01-28 LAB — HEMOGLOBIN A1C
Hgb A1c MFr Bld: 7.9 % — ABNORMAL HIGH (ref 4.8–5.6)
Mean Plasma Glucose: 180 mg/dL

## 2023-01-28 LAB — MAGNESIUM: Magnesium: 2.1 mg/dL (ref 1.7–2.4)

## 2023-01-28 MED ORDER — LEVOTHYROXINE SODIUM 88 MCG PO TABS
88.0000 ug | ORAL_TABLET | Freq: Every day | ORAL | Status: DC
  Administered 2023-01-29: 88 ug via ORAL
  Filled 2023-01-28 (×2): qty 1

## 2023-01-28 MED ORDER — APIXABAN 2.5 MG PO TABS: 2.5000 mg | ORAL_TABLET | Freq: Two times a day (BID) | ORAL | Status: DC

## 2023-01-28 MED ORDER — ATORVASTATIN CALCIUM 20 MG PO TABS
10.0000 mg | ORAL_TABLET | Freq: Every day | ORAL | Status: DC
  Administered 2023-01-28 – 2023-01-29 (×2): 10 mg via ORAL
  Filled 2023-01-28 (×2): qty 1

## 2023-01-28 MED ORDER — AMLODIPINE BESYLATE 5 MG PO TABS
5.0000 mg | ORAL_TABLET | Freq: Every day | ORAL | Status: DC
  Administered 2023-01-28 – 2023-01-29 (×2): 5 mg via ORAL
  Filled 2023-01-28 (×2): qty 1

## 2023-01-28 MED ORDER — METOPROLOL SUCCINATE ER 50 MG PO TB24
25.0000 mg | ORAL_TABLET | Freq: Every day | ORAL | Status: DC
  Administered 2023-01-28 – 2023-01-29 (×2): 25 mg via ORAL
  Filled 2023-01-28 (×2): qty 1

## 2023-01-28 MED ORDER — HYDRALAZINE HCL 50 MG PO TABS
50.0000 mg | ORAL_TABLET | Freq: Three times a day (TID) | ORAL | Status: DC
  Administered 2023-01-28 – 2023-01-29 (×3): 50 mg via ORAL
  Filled 2023-01-28 (×3): qty 1

## 2023-01-28 NOTE — ED Notes (Signed)
Pt ambulated the entire perimeter of the ED with stand by assist

## 2023-01-28 NOTE — Care Management Obs Status (Signed)
MEDICARE OBSERVATION STATUS NOTIFICATION   Patient Details  Name: Barbara Butler MRN: [id number] Date of Birth: 1929/10/05   Medicare Observation Status Notification Given:  Yes    Kreg Shropshire, RN 01/28/2023, 11:47 AM

## 2023-01-28 NOTE — ED Notes (Signed)
Patient CBG was 67. Will notify RN, Santina Evans.

## 2023-01-28 NOTE — ED Notes (Signed)
Pt provided regular Ginger ale for low BG, pt also just finished her dinner tray

## 2023-01-28 NOTE — TOC Transition Note (Signed)
Transition of Care [facility]) - CM/SW Discharge Note   Patient Details  Name: GREENLEE ANCHETA MRN: [id number] Date of Birth: [birth year]  Transition of Care [facility]) CM/SW Contact:  Kreg Shropshire, RN Phone Number: 01/28/2023, 8:44 AM   Clinical Narrative:    Pt has d/c orders. She will be transported home by family members. HH PT/RN/Aide ordered. [place] from [facility] accepted pt. Jon from [employer] confirmed that RW will be delivered to home. No further TOC needs.   Final next level of care: Home w Home Health Services Barriers to Discharge: Barriers Resolved   Patient Goals and CMS Choice CMS Medicare.gov Compare Post Acute Care list provided to:: Patient Choice offered to / list presented to : Patient  Discharge Placement                    Name of family member notified: Daughter Patient and family notified of of transfer: 01/28/23  Discharge Plan and Services Additional resources added to the After Visit Summary for                  DME Arranged: Walker rolling DME Agency: [facility]       HH Arranged: PT, RN, Nurse's Aide HH Agency: [facility] Date [facility] Agency Contacted: 01/28/23 Time HH Agency Contacted: [phone]    Social Determinants of Health (SDOH) Interventions SDOH Screenings   Tobacco Use: Medium Risk (01/26/2023)     Readmission Risk Interventions     No data to display

## 2023-01-28 NOTE — TOC Benefit Eligibility Note (Signed)
Pharmacy Patient Advocate Encounter  Insurance verification completed.    The patient is insured through [facility] Medicare Part D  Ran test claim for Eliquis 5 mg and the current 30 day co-pay is $40.00.   This test claim was processed through [facility]- copay amounts may vary at other pharmacies due to pharmacy/plan contracts, or as the patient moves through the different stages of their insurance plan.    Roland Earl, CPHT Pharmacy Patient Advocate Specialist [facility] Patient Advocate Team Direct Number: [phone]  Fax: [phone]

## 2023-01-28 NOTE — Progress Notes (Signed)
Physical Therapy Treatment Patient Details Name: Barbara Butler MRN: [id number] DOB: [birth year] Today's Date: 01/28/2023   History of Present Illness 89 y/o female presented to ED on 01/26/23 for new onset of confusion. 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 Found to be in Afib/Aflutter. PMH: HTN, T2DM, anemia, CAD    PT Comments  Patient progressing towards physical therapy goals. Patient continues to demonstrate mild confusion. Daughter present in room and stating PT never came yesterday. Provided education to daughter about this therapist evaluated patient on 7/22. Daughter not receptive of information. Updated discharge recommendation based on family request as she states they have no family members that could supervise patient at home. Patient currently at [facility] for ambulation with no AD. Anticipate patient will progress quickly as she has made progress since evaluation. Discharge plan updated.     Assistance Recommended at Discharge Frequent or constant Supervision/Assistance  If plan is discharge home, recommend the following:  Can travel by private vehicle    A little help with walking and/or transfers;A little help with bathing/dressing/bathroom;Assistance with cooking/housework;Direct supervision/assist for medications management;Direct supervision/assist for financial management;Assist for transportation;Help with stairs or ramp for entrance   Yes  Equipment Recommendations  Rolling  (2 wheels)    Recommendations for Other Services       Precautions / Restrictions Precautions Precautions: Fall Restrictions Weight Bearing Restrictions: No     Mobility  Bed Mobility Overal bed mobility: Modified Independent                  Transfers Overall transfer level: Needs assistance Equipment used: None Transfers: Sit to/from Stand Sit to Stand: Min guard                Ambulation/Gait Ambulation/Gait assistance: Min assist, Min  guard Gait Distance (Feet): 40 Feet Assistive device: None Gait Pattern/deviations: Step-through pattern, Decreased stride length Gait velocity: decreased     General Gait Details: initially requiring minA for balance but able to progress to min guard   Stairs             Wheelchair Mobility     Tilt Bed    Modified Rankin (Stroke Patients Only)       Balance Overall balance assessment: Mild deficits observed, not formally tested                                          Cognition Arousal/Alertness: Awake/alert Behavior During Therapy: WFL for tasks assessed/performed Overall Cognitive Status: Impaired/Different from baseline                       Memory: Decreased short-term memory     Awareness: Emergent Problem Solving: Slow processing General Comments: Daughter reports improved cognition from admission but still "foggy"        Exercises Other Exercises Other Exercises: patient performing self directed standing marching, seated LAQs, seated marching, sit ups    General Comments        Pertinent Vitals/Pain Pain Assessment Pain Assessment: No/denies pain    Home Living                          Prior Function            PT Goals (current goals can now be found in the care plan section) Acute Rehab PT Goals Patient  Stated Goal: to go home PT Goal Formulation: Patient unable to participate in goal setting Time For Goal Achievement: 02/10/23 Potential to Achieve Goals: Good Progress towards PT goals: Progressing toward goals    Frequency    Min 1X/week      PT Plan Discharge plan needs to be updated    Co-evaluation              AM-PAC PT "6 Clicks" Mobility   Outcome Measure  Help needed turning from your back to your side while in a flat bed without using bedrails?: None Help needed moving from lying on your back to sitting on the side of a flat bed without using bedrails?: None Help  needed moving to and from a bed to a chair (including a wheelchair)?: A Little Help needed standing up from a chair using your arms (e.g., wheelchair or bedside chair)?: A Little Help needed to walk in hospital room?: A Little Help needed climbing 3-5 steps with a railing? : A Lot 6 Click Score: 19    End of Session   Activity Tolerance: Patient tolerated treatment well Patient left: in bed;with call bell/phone within reach;with bed alarm set;with family/visitor present Nurse Communication: Mobility status PT Visit Diagnosis: Unsteadiness on feet (R26.81);Muscle weakness (generalized) (M62.81);Other abnormalities of gait and mobility (R26.89)     Time: 4259-5638 PT Time Calculation (min) (ACUTE ONLY): 27 min  Charges:    $Therapeutic Activity: 23-37 mins PT General Charges $$ ACUTE PT VISIT: 1 Visit                     Maylon Peppers, PT, DPT Physical Therapist - [facility]  [facility]     A  01/28/2023, 1:26 PM

## 2023-01-28 NOTE — Progress Notes (Signed)
Rounding Note    Patient Name: Barbara Butler Date of Encounter: 01/28/2023  [facility] Cardiologist: None   Subjective   Patient is overall doing well. She is still in rate controlled Afib. No chest pain or SOB.   Inpatient Medications    Scheduled Meds:   stroke: early stages of recovery book   Does not apply Once   apixaban  2.5 mg Oral BID   insulin aspart  0-15 Units Subcutaneous TID WC   Continuous Infusions:  sodium chloride 50 mL/hr at 01/27/23 2148   PRN Meds: acetaminophen **OR** acetaminophen (TYLENOL) oral liquid 160 mg/5 mL **OR** acetaminophen   Vital Signs    Vitals:   01/27/23 1000 01/28/23 0356 01/28/23 0516 01/28/23 0826  BP: (!) 164/102 101/75 (!) 151/107   Pulse: (!) 107 91 81   Resp: 20 17 16    Temp:    98.1 F (36.7 C)  TempSrc:    Oral  SpO2: 100% 100% 98%   Height:       No intake or output data in the 24 hours ending 01/28/23 0841    02/25/2019    8:53 AM 02/17/2019    8:35 AM 01/28/2019    9:52 AM  Last 3 Weights  Weight (lbs) 130 lb 130 lb 130 lb 1.1 oz  Weight (kg) 58.968 kg 58.968 kg 59 kg      Telemetry    Afib HR 90s, PVCs - Personally Reviewed  ECG    NO new - Personally Reviewed  Physical Exam   GEN: No acute distress.   Neck: No JVD Cardiac: Irreg Irreg, no murmurs, rubs, or gallops.  Respiratory: Clear to auscultation bilaterally. GI: Soft, nontender, non-distended  MS: No edema; No deformity. Neuro:  Nonfocal  Psych: Normal affect   Labs    High Sensitivity Troponin:  No results for input(s): "TROPONINIHS" in the last 720 hours.   Chemistry Recent Labs  Lab 01/26/23 1627 01/28/23 0620  NA 134*  --   K 4.5  --   CL 103  --   CO2 23  --   GLUCOSE 193*  --   BUN 20  --   CREATININE 0.98  --   CALCIUM 9.2  --   MG  --  2.1  PROT 7.4  --   ALBUMIN 4.3  --   AST 41  --   ALT 41  --   ALKPHOS 67  --   BILITOT 1.4*  --   GFRNONAA 54*  --   ANIONGAP 8  --     Lipids  Recent Labs   Lab 01/27/23 0338  CHOL 122  TRIG 71  HDL 26*  LDLCALC 82  CHOLHDL 4.7    Hematology Recent Labs  Lab 01/26/23 1627 01/27/23 0338  WBC 5.5 4.7  RBC 5.16* 4.48  HGB 14.4 12.9  HCT 44.1 38.2  MCV 85.5 85.3  MCH 27.9 28.8  MCHC 32.7 33.8  RDW 15.0 14.7  PLT 240 208   Thyroid  Recent Labs  Lab 01/26/23 1627  TSH 3.372    BNPNo results for input(s): "BNP", "PROBNP" in the last 168 hours.  DDimer No results for input(s): "DDIMER" in the last 168 hours.   Radiology    ECHOCARDIOGRAM COMPLETE  Result Date: 01/27/2023    ECHOCARDIOGRAM REPORT   Patient Name:   Barbara Butler Date of Exam: 01/27/2023 Medical Rec #:  [id number]        Height:  62.0 in Accession #:    [id number]       Weight:       130.0 lb Date of Birth:  February 06, 1930         BSA:          1.592 m Patient Age:    89 years         BP:           164/102 mmHg Patient Gender: F                HR:           87 bpm. Exam Location:  [facility] Procedure: 2D Echo, Cardiac Doppler and Color Doppler Indications:     Stroke  History:         Patient has no prior history of Echocardiogram examinations.                  Stroke; Risk Factors:Hypertension and Diabetes.  Sonographer:     Mikki Harbor Referring Phys:  [id number] Eliezer Mccoy PATEL Diagnosing Phys: Lorine Bears MD IMPRESSIONS  1. Left ventricular ejection fraction, by estimation, is 70 to 75%. The left ventricle has hyperdynamic function. The left ventricle has no regional wall motion abnormalities. There is moderate asymmetric left ventricular hypertrophy of the basal-septal  segment. Left ventricular diastolic parameters are indeterminate.  2. Right ventricular systolic function is normal. The right ventricular size is normal. There is normal pulmonary artery systolic pressure.  3. Left atrial size was severely dilated.  4. Right atrial size was moderately dilated.  5. The mitral valve is degenerative. Mild to moderate mitral valve regurgitation. No evidence of mitral stenosis.  Moderate mitral annular calcification.  6. Tricuspid valve regurgitation is moderate.  7. The aortic valve is calcified. Aortic valve regurgitation is mild. Aortic valve sclerosis/calcification is present, without any evidence of aortic stenosis.  8. The inferior vena cava is normal in size with greater than 50% respiratory variability, suggesting right atrial pressure of 3 mmHg. FINDINGS  Left Ventricle: Left ventricular ejection fraction, by estimation, is 70 to 75%. The left ventricle has hyperdynamic function. The left ventricle has no regional wall motion abnormalities. The left ventricular internal cavity size was normal in size. There is moderate asymmetric left ventricular hypertrophy of the basal-septal segment. Left ventricular diastolic parameters are indeterminate. Right Ventricle: The right ventricular size is normal. No increase in right ventricular wall thickness. Right ventricular systolic function is normal. There is normal pulmonary artery systolic pressure. The tricuspid regurgitant velocity is 2.17 m/s, and  with an assumed right atrial pressure of 5 mmHg, the estimated right ventricular systolic pressure is 23.8 mmHg. Left Atrium: Left atrial size was severely dilated. Right Atrium: Right atrial size was moderately dilated. Pericardium: There is no evidence of pericardial effusion. Mitral Valve: The mitral valve is degenerative in appearance. There is moderate thickening of the mitral valve leaflet(s). There is moderate calcification of the mitral valve leaflet(s). Moderate mitral annular calcification. Mild to moderate mitral valve regurgitation. No evidence of mitral valve stenosis. MV peak gradient, 5.3 mmHg. The mean mitral valve gradient is 2.0 mmHg. Tricuspid Valve: The tricuspid valve is normal in structure. Tricuspid valve regurgitation is moderate . No evidence of tricuspid stenosis. Aortic Valve: The aortic valve is calcified. Aortic valve regurgitation is mild. Aortic regurgitation  PHT measures 508 msec. Aortic valve sclerosis/calcification is present, without any evidence of aortic stenosis. Aortic valve mean gradient measures 2.5  mmHg. Aortic valve peak gradient measures 6.0 mmHg.  Aortic valve area, by VTI measures 2.16 cm. Pulmonic Valve: The pulmonic valve was normal in structure. Pulmonic valve regurgitation is mild. No evidence of pulmonic stenosis. Aorta: The aortic root is normal in size and structure. Venous: The inferior vena cava is normal in size with greater than 50% respiratory variability, suggesting right atrial pressure of 3 mmHg. IAS/Shunts: No atrial level shunt detected by color flow Doppler.  LEFT VENTRICLE PLAX 2D LVIDd:         3.80 cm LVIDs:         1.80 cm LV PW:         0.80 cm LV IVS:        1.20 cm LVOT diam:     1.90 cm LV SV:         37 LV SV Index:   23 LVOT Area:     2.84 cm  RIGHT VENTRICLE RV Basal diam:  3.15 cm RV Mid diam:    2.90 cm RV S prime:     11.50 cm/s LEFT ATRIUM              Index        RIGHT ATRIUM           Index LA diam:        4.50 cm  2.83 cm/m   RA Area:     24.20 cm LA Vol (A2C):   124.0 ml 77.90 ml/m  RA Volume:   71.80 ml  45.10 ml/m LA Vol (A4C):   92.5 ml  58.11 ml/m LA Biplane Vol: 108.0 ml 67.84 ml/m  AORTIC VALVE                    PULMONIC VALVE AV Area (Vmax):    2.04 cm     PV Vmax:       0.80 m/s AV Area (Vmean):   2.11 cm     PV Peak grad:  2.6 mmHg AV Area (VTI):     2.16 cm AV Vmax:           122.50 cm/s AV Vmean:          69.200 cm/s AV VTI:            0.172 m AV Peak Grad:      6.0 mmHg AV Mean Grad:      2.5 mmHg LVOT Vmax:         88.20 cm/s LVOT Vmean:        51.400 cm/s LVOT VTI:          0.132 m LVOT/AV VTI ratio: 0.76 AI PHT:            508 msec  AORTA Ao Root diam: 3.00 cm Ao Asc diam:  3.30 cm MITRAL VALVE               TRICUSPID VALVE MV Area (PHT): 4.21 cm    TR Peak grad:   18.8 mmHg MV Area VTI:   1.92 cm    TR Vmax:        217.00 cm/s MV Peak grad:  5.3 mmHg MV Mean grad:  2.0 mmHg    SHUNTS MV  Vmax:       1.15 m/s    Systemic VTI:  0.13 m MV Vmean:      56.8 cm/s   Systemic Diam: 1.90 cm MV Decel Time: 180 msec MV E velocity: 94.90 cm/s Lorine Bears MD Electronically signed by Lorine Bears MD Signature  Date/Time: 01/27/2023/2:39:55 PM    Final    CT ANGIO HEAD NECK W WO CM  Result Date: 01/27/2023 CLINICAL DATA:  Stroke follow-up EXAM: CT ANGIOGRAPHY HEAD AND NECK WITH AND WITHOUT CONTRAST TECHNIQUE: Multidetector CT imaging of the head and neck was performed using the standard protocol during bolus administration of intravenous contrast. Multiplanar CT image reconstructions and MIPs were obtained to evaluate the vascular anatomy. Carotid stenosis measurements (when applicable) are obtained utilizing NASCET criteria, using the distal internal carotid diameter as the denominator. RADIATION DOSE REDUCTION: This exam was performed according to the departmental dose-optimization program which includes automated exposure control, adjustment of the mA and/or kV according to patient size and/or use of iterative reconstruction technique. CONTRAST:  75mL OMNIPAQUE IOHEXOL 350 MG/ML SOLN COMPARISON:  Brain MRI from yesterday FINDINGS: CT HEAD FINDINGS Brain: Small acute cortical infarcts are occult when compared to MRI. Extensive chronic small vessel ischemia in the cerebral white matter, deep gray nuclei, and pons. No acute hemorrhage or hydrocephalus Vascular: See below Skull: Negative Sinuses/Orbits: Unremarkable Review of the MIP images confirms the above findings CTA NECK FINDINGS Aortic arch: Atheromatous plaque. 3 vessel branching with left vertebral artery arising from the arch in conjoined carotid origins. Right carotid system: Atheromatous plaque is mild for age at the bifurcation. No stenosis or ulceration. Left carotid system: Mild for age in mainly calcified plaque greatest at the bifurcation without ulceration or flow reducing stenosis. Vertebral arteries: The right vertebral artery is  dominant. The left vertebral artery arises from the arch. No proximal subclavian stenosis despite atherosclerosis. The vertebral arteries are smoothly contoured and widely patent. Skeleton: Unremarkable Other neck: No acute finding Upper chest: No acute finding Review of the MIP images confirms the above findings CTA HEAD FINDINGS Anterior circulation: Atheromatous calcification of the carotid siphons. Diffuse atheromatous irregularity of intracranial branches with moderate moderate left M1 stenosis. There is a supraclinoid right ICA aneurysm projecting superiorly and laterally measuring 3.5 mm on coronal reformats. Posterior circulation: Right dominant vertebral artery. Early branching left PICA. Atheromatous irregularity asymmetrically affects the left vertebral artery but no focal high-grade stenosis. Extensive atheromatous irregularity of the posterior cerebral arteries with high-grade left P2 stenosis. Fetal type origin of the right PCA. Venous sinuses: Unremarkable in the arterial phase Anatomic variants: As above Review of the MIP images confirms the above findings IMPRESSION: 1. No emergent vascular finding. 2. Atherosclerosis in the neck without ulceration or flow reducing stenosis. 3. Even greater intracranial atherosclerosis with moderate left MCA and and advanced left PCA stenoses. 4. 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 Electronically Signed   By: Tiburcio Pea M.D.   On: 01/27/2023 10:23   MR Brain Wo Contrast (neuro protocol)  Result Date: 01/26/2023 CLINICAL DATA:  Initial evaluation for mental status change, unknown cause. EXAM: MRI HEAD WITHOUT CONTRAST TECHNIQUE: Multiplanar, multiecho pulse sequences of the brain and surrounding structures were obtained without intravenous contrast. COMPARISON:  Prior CT from earlier the same day. FINDINGS: Brain: Examination mildly degraded by motion artifact. Diffuse prominence of the CSF containing spaces compatible generalized age-related cerebral  atrophy. Patchy and confluent T2/FLAIR hyperintensity involving the periventricular and deep white matter both cerebral hemispheres as well as the pons, consistent with chronic small vessel ischemic disease, moderate to advanced in nature. Few small remote infarcts noted about the right greater than left cerebellum. Small remote lacunar infarct noted within the left pons. Patchy small volume diffusion signal abnormality is seen involving the cortical gray matter of the left occipital lobe (  series 13, image 25). Additional patchy small volume diffusion signal abnormality noted at the contralateral right occipital lobe (series 13, image 30). Mild patchy involvement of the right posterior splenium (series 13, image 26). Findings consistent with small acute to subacute ischemic infarcts. Associated mild petechial blood products noted on SWI sequence (series 17, images 33, 38). No significant mass effect. No other evidence for acute or subacute ischemia. No other acute intracranial hemorrhage. Few additional punctate chronic micro hemorrhages noted elsewhere, likely small vessel related. No mass lesion, midline shift or mass effect. Mild ventricular prominence related to global parenchymal volume loss of hydrocephalus. No extra-axial fluid collection. Pituitary gland and suprasellar region within normal limits. Vascular: Major intracranial vascular flow voids are maintained. Skull and upper cervical spine: Craniocervical junction with normal limits. Bone marrow signal intensity normal. No scalp soft tissue abnormality. Sinuses/Orbits: Prior bilateral ocular lens replacement. Paranasal sinuses are largely clear. No significant mastoid effusion. Other: None. IMPRESSION: 1. Patchy small volume acute to early subacute ischemic infarcts involving the left greater than right occipital lobes and right posterior splenium. Associated mild petechial blood products without frank hemorrhagic transformation or significant mass  effect. 2. Underlying age-related cerebral atrophy with moderate to advanced chronic microvascular ischemic disease, with a few small remote infarcts involving the right greater than left cerebellum and left pons. Electronically Signed   By: Rise Mu M.D.   On: 01/26/2023 21:44   DG Chest Portable 1 View  Result Date: 01/26/2023 CLINICAL DATA:  Altered mental status. EXAM: PORTABLE CHEST 1 VIEW COMPARISON:  None Available. FINDINGS: 1644 hours. The heart size is at the upper limits of normal for AP technique. There is aortic atherosclerosis and mitral annular calcification. Mild interstitial prominence throughout the lungs without definite edema. No focal airspace disease, pleural effusion or pneumothorax. No acute osseous findings are evident. IMPRESSION: Borderline heart size with mild interstitial prominence. No definite edema or focal airspace disease. Electronically Signed   By: Carey Bullocks M.D.   On: 01/26/2023 17:30   CT Head Wo Contrast  Result Date: 01/26/2023 CLINICAL DATA:  Delirium EXAM: CT HEAD WITHOUT CONTRAST TECHNIQUE: Contiguous axial images were obtained from the base of the skull through the vertex without intravenous contrast. RADIATION DOSE REDUCTION: This exam was performed according to the departmental dose-optimization program which includes automated exposure control, adjustment of the mA and/or kV according to patient size and/or use of iterative reconstruction technique. COMPARISON:  None Available. FINDINGS: Brain: No evidence of acute infarction, hemorrhage, hydrocephalus, extra-axial collection or mass lesion/mass effect. There is moderate diffuse atrophy. There is moderate periventricular and deep white matter hypodensity, likely chronic small vessel ischemic change. Vascular: Atherosclerotic calcifications are present within the cavernous internal carotid arteries. Skull: Normal. Negative for fracture or focal lesion. Sinuses/Orbits: No acute finding. Other:  None. IMPRESSION: 1. No acute intracranial process. 2. Moderate atrophy and chronic small vessel ischemic changes. Electronically Signed   By: Darliss Cheney M.D.   On: 01/26/2023 17:23    Cardiac Studies   Echo 01/27/23 1. Left ventricular ejection fraction, by estimation, is 70 to 75%. The  left ventricle has hyperdynamic function. The left ventricle has no  regional wall motion abnormalities. There is moderate asymmetric left  ventricular hypertrophy of the basal-septal   segment. Left ventricular diastolic parameters are indeterminate.   2. Right ventricular systolic function is normal. The right ventricular  size is normal. There is normal pulmonary artery systolic pressure.   3. Left atrial size was severely dilated.  4. Right atrial size was moderately dilated.   5. The mitral valve is degenerative. Mild to moderate mitral valve  regurgitation. No evidence of mitral stenosis. Moderate mitral annular  calcification.   6. Tricuspid valve regurgitation is moderate.   7. The aortic valve is calcified. Aortic valve regurgitation is mild.  Aortic valve sclerosis/calcification is present, without any evidence of  aortic stenosis.   8. The inferior vena cava is normal in size with greater than 50%  respiratory variability, suggesting right atrial pressure of 3 mmHg.    Patient Profile     89 y.o. female with a hx of HTN, HLD, DM2, hypothyroidism, anemia who is being seen 01/27/2023 for the evaluation of Afib   Assessment & Plan    Acute stroke - MRI brain showed acute to subacute ischemic infarcts in the occipital lobes and posterior splenium - neurology following - out of window for TPN - started on Eliquis for afib  - permissive HTN   New onset Afib - found to be in Afib/flutter with controlled rates in the 90s - unclear chronicity of Afib - CHADSVASC at least 7 (stroke x 2, age x 2, female, HTN, DM2) - she qualifies for low Eliquis given age and weight. Started on Eliquis  2.5mg  BID - echo showed LVEF 70-75%, no WMA, moderate LVH, normal RVSF, severely dilated left atrium, moderately dilate right atrium, moderate TR, mild AI - continue Toprol 25mg  daily - plan to continue rate control   Hypothyroidism - TSH wnl on synthroid PTA  For questions or updates, please contact [facility] Please consult [email or web address] for contact info under        Signed,  David Stall, PA-C  01/28/2023, 8:41 AM

## 2023-01-28 NOTE — ED Notes (Signed)
Pt again ambulated the length of the ED with stand by assist

## 2023-01-28 NOTE — ED Notes (Signed)
Pt CBG was 101 after eating her dinner. RN, Santina Evans was notified.

## 2023-01-28 NOTE — NC FL2 (Signed)
[place] MEDICAID FL2 LEVEL OF CARE FORM     IDENTIFICATION  Patient Name: Barbara Butler Birthdate: 1929/08/18 Sex: female Admission Date (Current Location): 01/26/2023  [facility] and [place] Number:  Chiropodist and Address:  [facility], [address], [place], [place] [address]      Provider Number: [id number]  Attending Physician Name and Address:  Arnetha Courser, MD  Relative Name and Phone Number:  [name] [phone]    Current Level of Care: Hospital Recommended Level of Care: Skilled Nursing Facility Prior Approval Number:    Date Approved/Denied:   PASRR Number: [id number]  Discharge Plan: SNF    Current Diagnoses: Patient Active Problem List   Diagnosis Date Noted   Cerebrovascular accident (CVA) (HCC) 01/27/2023   Hypertension 01/26/2023   Hypothyroidism 01/26/2023   Confusion and disorientation / subacute cva. 01/26/2023   Type 2 diabetes mellitus with hyperglycemia, without long-term current use of insulin (HCC) 08/31/2019    Orientation RESPIRATION BLADDER Height & Weight     Self, Time, Situation  Normal Continent Weight:   Height:  5\' 2"  (157.5 cm)  BEHAVIORAL SYMPTOMS/MOOD NEUROLOGICAL BOWEL NUTRITION STATUS      Continent Diet (see d/c summary)  AMBULATORY STATUS COMMUNICATION OF NEEDS Skin   Supervision Verbally Normal                       Personal Care Assistance Level of Assistance  Bathing, Feeding, Dressing Bathing Assistance: Independent Feeding assistance: Limited assistance Dressing Assistance: Limited assistance     Functional Limitations Info             SPECIAL CARE FACTORS FREQUENCY  PT (By licensed PT), OT (By licensed OT)     PT Frequency: x5 min per week OT Frequency: x5 min per week            Contractures Contractures Info: Present    Additional Factors Info  Code Status Code Status Info: Full             Current Medications (01/28/2023):  This is  the current hospital active medication list Current Facility-Administered Medications  Medication Dose Route Frequency Provider Last Rate Last Admin    stroke: early stages of recovery book   Does not apply Once Gertha Calkin, MD       0.9 %  sodium chloride infusion   Intravenous Continuous Gertha Calkin, MD 50 mL/hr at 01/27/23 2148 New Bag at 01/27/23 2148   acetaminophen (TYLENOL) tablet 650 mg  650 mg Oral Q4H PRN Gertha Calkin, MD       Or   acetaminophen (TYLENOL) 160 MG/5ML solution 650 mg  650 mg Per Tube Q4H PRN Gertha Calkin, MD       Or   acetaminophen (TYLENOL) suppository 650 mg  650 mg Rectal Q4H PRN Gertha Calkin, MD       amLODipine (NORVASC) tablet 5 mg  5 mg Oral Daily Arnetha Courser, MD   5 mg at 01/28/23 1026   apixaban (ELIQUIS) tablet 2.5 mg  2.5 mg Oral BID Mila Merry A, RPH   2.5 mg at 01/28/23 1017   atorvastatin (LIPITOR) tablet 10 mg  10 mg Oral Daily Arnetha Courser, MD   10 mg at 01/28/23 1222   hydrALAZINE (APRESOLINE) tablet 50 mg  50 mg Oral Q8H Amin, Tilman Neat, MD       insulin aspart (novoLOG) injection 0-15 Units  0-15 Units Subcutaneous TID  WC Gertha Calkin, MD   8 Units at 01/28/23 1222   [START ON 01/29/2023] levothyroxine (SYNTHROID) tablet 88 mcg  88 mcg Oral Daily Arnetha Courser, MD       metoprolol succinate (TOPROL-XL) 24 hr tablet 25 mg  25 mg Oral Daily Arnetha Courser, MD   25 mg at 01/28/23 1221   Current Outpatient Medications  Medication Sig Dispense Refill   amLODipine (NORVASC) 5 MG tablet Take 5 mg by mouth daily.     apixaban (ELIQUIS) 2.5 MG TABS tablet Take 1 tablet (2.5 mg total) by mouth 2 (two) times daily. 60 tablet 1   Ascorbic Acid (VITAMIN C) 1000 MG tablet Take 1,000 mg by mouth daily at 12 noon.     aspirin EC 81 MG tablet Take 81 mg by mouth daily at 12 noon.     cholecalciferol (VITAMIN D3) 25 MCG (1000 UT) tablet Take 1,000 Units by mouth daily at 12 noon.     hydrALAZINE (APRESOLINE) 50 MG tablet Take 50 mg by mouth 2 (two) times  a day.     levothyroxine (SYNTHROID, LEVOTHROID) 88 MCG tablet Take 88 mcg by mouth daily.      MEGARED OMEGA-3 KRILL OIL PO Take 750 mg by mouth daily at 12 noon.     metFORMIN (GLUCOPHAGE) 500 MG tablet Take 500 mg by mouth 2 (two) times daily.     metoprolol succinate (TOPROL-XL) 25 MG 24 hr tablet Take 1 tablet by mouth daily.     olmesartan (BENICAR) 40 MG tablet Take 40 mg by mouth daily.       Discharge Medications: Please see discharge summary for a list of discharge medications.  Relevant Imaging Results:  Relevant Lab Results:   Additional Information GNF#[id number]  Kreg Shropshire, RN

## 2023-01-28 NOTE — Progress Notes (Signed)
Progress Note   Patient: Barbara Butler KZS:[id number] DOB: 1930-05-10 DOA: 01/26/2023     0 DOS: the patient was seen and examined on 01/28/2023   Brief hospital course: Taken from H&P and prior notes.   Barbara Butler is a 89 y.o. female with medical history significant for hypertension, diabetes mellitus type 2, hypothyroidism, anemia presenting with intermittent disorientation.Last known normal was yesterday at about 11 AM.   ED course.  Vitals mostly stable, labs mostly unremarkable with mildly elevated T. bili at 1.4.  EKG with concern of A-fib/a flutter.  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  Associated with mild petechial blood products without frank hemorrhagic transformation or mass effect.  Concern of embolic phenomena.  Teleneurology was consulted and they were recommending DAPT with aspirin and Plavix for now and transition to Eliquis on day 4.  7/22: Blood pressure at 164/103.  Lipid panel mostly normal with HDL of 26 and LDL of 82.  TSH 3.372, CBG 156.  Passed swallow evaluation.  CTA head and neck was negative for any significant large vessel stenosis. Some intracranial atherosclerosis with moderate left MCA and advanced left PCA stenosis.  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  PT and OT recommended home health services which were ordered. CHADSVASC at least 7 , cardiology can consider DCCV as outpatient in 3 to 4 weeks after sufficient anticoagulation.  Will continue home Toprol at 25 mg daily.  Patient will be started on Eliquis after 3 days as recommended by neurology.  Echocardiogram with hyperdynamic EF and indeterminate diastolic function.  Patient remained stable and at baseline.  No neurologic deficit.  She has been given aspirin for 3 days and then she will start Eliquis from  01/30/2023 as recommended by neurology.  Patient will continue on current medications and need to have a close follow-up with neurology and cardiology for further recommendations.  7/23: Patient was discharged yesterday after completing the workup of stroke.  Remained at baseline.  Family initially said that they need 1 more night so they can make arrangements.  Home health services were ordered and are in placed.  This morning daughter was becoming extremely rude with myself and with other providers especially PT and accusing [place] that we did not did well and told them that she will be staying in the hospital for at least 4 days, this type of message was never communicated.  She was told in the morning rounds that it depends on PT evaluation whether she will be going to rehab or to home health after completing stroke workup.  Daughter was keeps accusing that her mother lives alone and how we can discharge her. PT reevaluated her, patient is able to walk but the change the recommendation to rehab based on no supervision at home.  TOC started the workup, once she had a bed we will try getting insurance authorization and at that point it will depend on insurance.  Home antihypertensives were restarted as she is now out of window for permissive hypertension.  Assessment and Plan: * Confusion and disorientation / subacute cva. Bilateral small scattered embolic stroke. MRI shows BL subacute infarcts whish I suspect are new.  Pt is outside TPA window. And recommended inexpedient workup go confusion. Patient completed the workup. Echocardiogram with hyperdynamic EF, diastolic dysfunction and biatrial dilatation which can make  her high risk for thromboembolism with this new diagnosis of atrial flutter. -Patient was started on Eliquis at 25 mg twice daily -Home health services were arranged as recommended by PT but family is refusing to take care of her and accusing all the providers. -TOC is now looking for a  placement   Atrial flutter (HCC) CHADSVASC at least 7 , cardiology was consulted and they were recommending restarting metoprolol for rate control and age-appropriate dose of Eliquis which was initiated. Patient will follow-up with cardiology as outpatient and they can consider DCCV as appropriate in next 2 to 3 weeks  Hypothyroidism Continue levothyroxine at 88 mcg.  Hypertension Antihypertensives were initially held for permissive hypertension. -Restarting home metoprolol, amlodipine and hydralazine    Type 2 diabetes mellitus with hyperglycemia, without long-term current use of insulin (HCC) -SSI    Subjective: Patient was seen and examined today.  Daughter at bedside who was keeps accusing and saying that I am a liar and I told them that she will be in the hospital for rehab.  This type of message was never communicated.  Physical Exam: Vitals:   01/28/23 0516 01/28/23 0826 01/28/23 0842 01/28/23 1215  BP: (!) 151/107  (!) 175/96 (!) 168/119  Pulse: 81  (!) 105   Resp: 16  16 (!) 21  Temp:  98.1 F (36.7 C)    TempSrc:  Oral    SpO2: 98%  100% 100%  Height:       General.  Frail elderly lady, in no acute distress. Pulmonary.  Lungs clear bilaterally, normal respiratory effort. CV.  Regular rate and rhythm, no JVD, rub or murmur. Abdomen.  Soft, nontender, nondistended, BS positive. CNS.  Alert and oriented .  No focal neurologic deficit. Extremities.  No edema, no cyanosis, pulses intact and symmetrical. Psychiatry.  Judgment and insight appears normal.   Data Reviewed: Prior data reviewed  Family Communication: Daughter at bedside and extremely rude.  Disposition: Status is: Observation The patient remains OBS appropriate and will d/c before 2 midnights.  Planned Discharge Destination: Skilled nursing facility  DVT prophylaxis.  Eliquis Time spent: 45 minutes  This record has been created using Conservation officer, historic buildings. Errors have been sought and  corrected,but may not always be located. Such creation errors do not reflect on the standard of care.   Author: Arnetha Courser, MD 01/28/2023 1:47 PM  For on call review [email or web address].

## 2023-01-28 NOTE — Assessment & Plan Note (Signed)
CHADSVASC at least 7 , cardiology was consulted and they were recommending restarting metoprolol for rate control and age-appropriate dose of Eliquis which was initiated. Patient will follow-up with cardiology as outpatient and they can consider DCCV as appropriate in next 2 to 3 weeks

## 2023-01-28 NOTE — TOC Progression Note (Addendum)
Transition of Care [facility]) - Progression Note    Patient Details  Name: Barbara Butler MRN: [id number] Date of Birth: 1930/02/17  [facility]) CM/SW Contact  Kreg Shropshire, RN Phone Number: 01/28/2023, 1:30 PM  Clinical Narrative:    Cm received notification from PT/OT regarding SNF workup. Cm spoke with pt and family. They have no preference on facility. Cm sent out referrals to SNF in [facility]. [place] [phone] A. FL2 completed. Cm awaiting to hear back from facilities.  1616- Cm received call from [facility] and they have a bed. Cm informed pt and family and they agreed. Cm started insurance authorization. Authorization is currently pending as of 1617.   Expected Discharge Plan: Home w Home Health Services Barriers to Discharge: Barriers Resolved  Expected Discharge Plan and Services       Living arrangements for the past 2 months: Single Family Home Expected Discharge Date: 01/27/23               DME Arranged: Dan Humphreys rolling DME Agency: [employer]       HH Arranged: PT, RN, Nurse's Aide HH Agency: [facility] Date HH Agency Contacted: 01/28/23 Time HH Agency Contacted: [phone]     Social Determinants of Health (SDOH) Interventions SDOH Screenings   Tobacco Use: Medium Risk (01/26/2023)    Readmission Risk Interventions     No data to display

## 2023-01-28 NOTE — ED Notes (Signed)
Pt ambulated with stand by around the perimeter of the ED

## 2023-01-29 DIAGNOSIS — I4891 Unspecified atrial fibrillation: Secondary | ICD-10-CM | POA: Diagnosis not present

## 2023-01-29 DIAGNOSIS — E1165 Type 2 diabetes mellitus with hyperglycemia: Secondary | ICD-10-CM | POA: Diagnosis not present

## 2023-01-29 DIAGNOSIS — I6310 Cerebral infarction due to embolism of unspecified precerebral artery: Secondary | ICD-10-CM | POA: Diagnosis not present

## 2023-01-29 DIAGNOSIS — R410 Disorientation, unspecified: Secondary | ICD-10-CM | POA: Diagnosis not present

## 2023-01-29 LAB — CBG MONITORING, ED: Glucose-Capillary: 179 mg/dL — ABNORMAL HIGH (ref 70–99)

## 2023-01-29 NOTE — TOC Progression Note (Signed)
Transition of Care [facility]) - Progression Note    Patient Details  Name: KIJANA ESTOCK MRN: [id number] Date of Birth: 1930/02/28  Transition of Care [facility]) CM/SW Contact  Kreg Shropshire, RN Phone Number: 01/29/2023, 8:39 AM  Clinical Narrative:     According to Alfredo Batty approved for SNF. Auth ID is [id number]. Cm confirmed bed from [employer]. She will be going to room 101. Cm updated nursing staff, pt and family   Expected Discharge Plan: Home w Home Health Services Barriers to Discharge: Barriers Resolved  Expected Discharge Plan and Services       Living arrangements for the past 2 months: Single Family Home Expected Discharge Date: 01/27/23               DME Arranged: Dan Humphreys rolling DME Agency: [employer]       HH Arranged: PT, RN, Nurse's Aide HH Agency: [facility] Date HH Agency Contacted: 01/28/23 Time HH Agency Contacted: [phone]     Social Determinants of Health (SDOH) Interventions SDOH Screenings   Tobacco Use: Medium Risk (01/26/2023)    Readmission Risk Interventions     No data to display

## 2023-01-29 NOTE — Progress Notes (Addendum)
Speech Language Pathology Treatment: Cognitive-Linquistic  Patient Details Name: Barbara Butler MRN: [id number] DOB: 1930/04/21 Today's Date: 01/29/2023 Time: 5093-2671 SLP Time Calculation (min) (ACUTE ONLY): 35 min  Assessment / Plan / Recommendation Clinical Impression  Pt seen for ongoing cognitive-linguistic therapy in the setting of acute CVA. Education provided to both pt and Daughter who arrived after session re: speech-communication strategies to enhance verbal communication when needed.  Pt presents with mild-appearing cognitive-communication deficits c/b impairments in recall, thought organization, and executive functioning. Orientation improved. Verbal cues (binary options and semantic cues) bolstered cognitive processing, recall, and accuracy. Independent expressive/receptive language and simple sustained attention was functional during session; no gross, overt motor speech deficits. Pt exhibited awareness of some deficits and requested "a moment" for answering.    Pt demonstrated good follow through w/ instructions w/ verbal cues; best supported by giving Time for practice of strategies and reducing distractions in room/environment(ie; tv turned off; door closed).   Anticipate need for Supervision with cognitive-communication in ADLs in next setting working towards IADLs at D/C. ST services recommends follow up for Cognitive-communication tx w/in structured, daily ADLs at SNF for ongoing tx. Pt would benefit from Supervision and assistance with communication strategies in communication ADLS. Acute needs have been identified, met currently. MD to reconsult if any new needs arise w/in stay at the hospital.  Recommend ongoing assessment and f/u at next venue as well as Neurology consultation for formal Cognitive assessment as part of the POC at D/C. Was able to meet briefly w/ Daughter post session to address few communication strategies as practiced during session today. Daughter  agreed. MD/NSG/TOC updated. Precautions posted at bedside.        HPI HPI: 89yo woman w/PMH of HTN, DM, HLD, CAD p/w AMS on 01/26/23. She reportedly developed confusion while at church. Her friend brought her to church initially and she was already acting abnormally. At church, she wasn't answering question appropriately and was disoriented. EMS noted new onset Afib.  MRI Brain shows small scattered bilateral acute infarcts suspicious for embolic etiology, possibly due to Afib.  Pt currently on a regular solids and thin liquid diet.      SLP Plan  Continue with current plan of care (f/u at next venue of care)      Recommendations for follow up therapy are one component of a multi-disciplinary discharge planning process, led by the attending physician.  Recommendations may be updated based on patient status, additional functional criteria and insurance authorization.    Recommendations  Diet recommendations: Regular;Thin liquid Liquids provided via: Cup Medication Administration: Whole meds with puree (if needed for ease and safety of swallowing) Supervision: Patient able to self feed (tray setup and support) Compensations: Minimize environmental distractions;Slow rate;Small sips/bites;Follow solids with liquid Postural Changes and/or Swallow Maneuvers: Out of bed for meals;Seated upright 90 degrees;Upright 30-60 min after meal                 (Dietician f/u; Palliative Care f/u for GOC) Oral care BID;Patient independent with oral care (setup)   Intermittent Supervision/Assistance Cognitive communication deficit (R41.841)     Continue with current plan of care (f/u at next venue of care)       Jerilynn Som, MS, CCC-SLP Speech Language Pathologist Rehab Services; [facility] [phone] (ascom) ,  01/29/2023, 12:16 PM

## 2023-01-29 NOTE — TOC Transition Note (Signed)
Transition of Care [facility]) - CM/SW Discharge Note   Patient Details  Name: Barbara Butler MRN: [id number] Date of Birth: Jan 16, 1930  [facility]) CM/SW Contact:  Kreg Shropshire, RN Phone Number: 01/29/2023, 9:24 AM   Clinical Narrative:    Patient will DC to: [name] Anticipated DC date: 01/29/23 Family notified: Daughter Mardene Celeste Transport by: Non Emergent EMS  Per MD patient ready for DC to . RN, patient, patient's family, and facility notified of DC. Discharge Summary sent to facility. RN given number for report. DC packet on chart. Ambulance transport requested for patient.  TOC signing off.    Final next level of care: Skilled Nursing Facility Barriers to Discharge: Barriers Resolved   Patient Goals and CMS Choice CMS Medicare.gov Compare Post Acute Care list provided to:: Patient Choice offered to / list presented to : Patient  Discharge Placement                  Patient to be transferred to facility by: non emergent ems Name of family member notified: Daughter Patient and family notified of of transfer: 01/29/23  Discharge Plan and Services Additional resources added to the After Visit Summary for                  DME Arranged: Walker rolling DME Agency: [employer]       HH Arranged: PT, RN, Nurse's Aide HH Agency: [facility] Date [facility] Agency Contacted: 01/28/23 Time HH Agency Contacted: [phone]    Social Determinants of Health (SDOH) Interventions SDOH Screenings   Tobacco Use: Medium Risk (01/26/2023)     Readmission Risk Interventions     No data to display

## 2023-01-29 NOTE — ED Notes (Signed)
[facility]  called  for  transport  to  [facility]

## 2023-01-29 NOTE — Progress Notes (Signed)
Rounding Note    Patient Name: Barbara Butler Date of Encounter: 01/29/2023  [facility] Cardiologist: None   Subjective   No chest pain, dyspnea, palpitations, dizziness, presyncope, or syncope.   Inpatient Medications    Scheduled Meds:   stroke: early stages of recovery book   Does not apply Once   amLODipine  5 mg Oral Daily   [START ON 01/30/2023] apixaban  2.5 mg Oral BID   atorvastatin  10 mg Oral Daily   hydrALAZINE  50 mg Oral Q8H   insulin aspart  0-15 Units Subcutaneous TID WC   levothyroxine  88 mcg Oral Daily   metoprolol succinate  25 mg Oral Daily   Continuous Infusions:   PRN Meds: acetaminophen **OR** acetaminophen (TYLENOL) oral liquid 160 mg/5 mL **OR** acetaminophen   Vital Signs    Vitals:   01/28/23 2116 01/29/23 0031 01/29/23 0400 01/29/23 0534  BP: (!) 171/112 (!) 168/109 (!) 146/105   Pulse:  80 77   Resp:  14 14   Temp:    98.4 F (36.9 C)  TempSrc:    Oral  SpO2:  99% 100%   Weight:      Height:       No intake or output data in the 24 hours ending 01/29/23 0839    01/28/2023    3:00 PM 02/25/2019    8:53 AM 02/17/2019    8:35 AM  Last 3 Weights  Weight (lbs) 121 lb 4.1 oz 130 lb 130 lb  Weight (kg) 55 kg 58.968 kg 58.968 kg      Telemetry    Not on tele - Personally Reviewed  ECG    No new tracings - Personally Reviewed  Physical Exam   GEN: No acute distress.   Neck: No JVD Cardiac: IRIR, no murmurs, rubs, or gallops.  Respiratory: Clear to auscultation bilaterally. GI: Soft, nontender, non-distended  MS: No edema; No deformity. Neuro:  Nonfocal  Psych: Normal affect   Labs    High Sensitivity Troponin:  No results for input(s): "TROPONINIHS" in the last 720 hours.   Chemistry Recent Labs  Lab 01/26/23 1627 01/28/23 0620  NA 134*  --   K 4.5  --   CL 103  --   CO2 23  --   GLUCOSE 193*  --   BUN 20  --   CREATININE 0.98  --   CALCIUM 9.2  --   MG  --  2.1  PROT 7.4  --   ALBUMIN 4.3   --   AST 41  --   ALT 41  --   ALKPHOS 67  --   BILITOT 1.4*  --   GFRNONAA 54*  --   ANIONGAP 8  --     Lipids  Recent Labs  Lab 01/27/23 0338  CHOL 122  TRIG 71  HDL 26*  LDLCALC 82  CHOLHDL 4.7    Hematology Recent Labs  Lab 01/26/23 1627 01/27/23 0338  WBC 5.5 4.7  RBC 5.16* 4.48  HGB 14.4 12.9  HCT 44.1 38.2  MCV 85.5 85.3  MCH 27.9 28.8  MCHC 32.7 33.8  RDW 15.0 14.7  PLT 240 208   Thyroid  Recent Labs  Lab 01/26/23 1627  TSH 3.372    BNPNo results for input(s): "BNP", "PROBNP" in the last 168 hours.  DDimer No results for input(s): "DDIMER" in the last 168 hours.   Radiology    CT ANGIO HEAD NECK W WO CM  Result Date: 01/27/2023 IMPRESSION: 1. No emergent vascular finding. 2. Atherosclerosis in the neck without ulceration or flow reducing stenosis. 3. Even greater intracranial atherosclerosis with moderate left MCA and and advanced left PCA stenoses. 4. 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 Electronically Signed   By: Tiburcio Pea M.D.   On: 01/27/2023 10:23    Cardiac Studies   2D echo 01/27/23: 1. Left ventricular ejection fraction, by estimation, is 70 to 75%. The  left ventricle has hyperdynamic function. The left ventricle has no  regional wall motion abnormalities. There is moderate asymmetric left  ventricular hypertrophy of the basal-septal   segment. Left ventricular diastolic parameters are indeterminate.   2. Right ventricular systolic function is normal. The right ventricular  size is normal. There is normal pulmonary artery systolic pressure.   3. Left atrial size was severely dilated.   4. Right atrial size was moderately dilated.   5. The mitral valve is degenerative. Mild to moderate mitral valve  regurgitation. No evidence of mitral stenosis. Moderate mitral annular  calcification.   6. Tricuspid valve regurgitation is moderate.   7. The aortic valve is calcified. Aortic valve regurgitation is mild.  Aortic valve  sclerosis/calcification is present, without any evidence of  aortic stenosis.   8. The inferior vena cava is normal in size with greater than 50%  respiratory variability, suggesting right atrial pressure of 3 mmHg.    Patient Profile     89 y.o. female with a hx of HTN, HLD, DM2 with diabetic neuropathy, hypothyroidism, and anemia who was admitted 7/21 with AMS and found to have CVA and we are seeing for the evaluation of newly documented Afib.  Assessment & Plan    Acute to subacute stroke - MRI brain showed acute to subacute ischemic infarcts in the occipital lobes and posterior splenium - Neurology following - Out of window for TPN - Likely secondary to Afib - Now on Eliquis - Confusion improving - Has completed permissive hypertension  - LDL    New onset Afib - Found to be in rate controlled Afib/flutter  - Unclear chronicity of Afib - CHADSVASC at least 8 (HTN, age x 2, DM, stroke x 2, vascular disease, sex category)  - She qualifies for Eliquis 2.5 mg bid, given age and weight - Echo showed LVEF 70-75%, no WMA, moderate LVH, normal RVSF, severely dilated left atrium, moderately dilate right atrium, moderate TR, mild AI - Continue Toprol 25mg  daily - Plan to continue rate control (severely dilated left atrium, advanced age, and asymptomatic)   Hypothyroidism - TSH normal on synthroid PTA   For questions or updates, please contact [facility] Please consult [email or web address] for contact info under        Signed, Eula Listen, PA-C  01/29/2023, 8:39 AM

## 2023-02-05 ENCOUNTER — Inpatient Hospital Stay (HOSPITAL_COMMUNITY): Payer: Medicare PPO | Admitting: Anesthesiology

## 2023-02-05 ENCOUNTER — Emergency Department (HOSPITAL_COMMUNITY): Payer: Medicare PPO

## 2023-02-05 ENCOUNTER — Inpatient Hospital Stay (HOSPITAL_COMMUNITY)
Admission: EM | Admit: 2023-02-05 | Discharge: 2023-02-11 | Disposition: A | Discharge status: Skilled Nursing Facility | Payer: Medicare PPO | Attending: Internal Medicine | Admitting: Internal Medicine

## 2023-02-05 ENCOUNTER — Other Ambulatory Visit: Payer: Self-pay

## 2023-02-05 ENCOUNTER — Encounter (HOSPITAL_COMMUNITY): Payer: Self-pay

## 2023-02-05 DIAGNOSIS — Z87891 Personal history of nicotine dependence: Secondary | ICD-10-CM | POA: Diagnosis not present

## 2023-02-05 DIAGNOSIS — I10 Essential (primary) hypertension: Secondary | ICD-10-CM | POA: Diagnosis present

## 2023-02-05 DIAGNOSIS — S72141A Displaced intertrochanteric fracture of right femur, initial encounter for closed fracture: Principal | ICD-10-CM | POA: Diagnosis present

## 2023-02-05 DIAGNOSIS — S42214D Unspecified nondisplaced fracture of surgical neck of right humerus, subsequent encounter for fracture with routine healing: Secondary | ICD-10-CM | POA: Diagnosis not present

## 2023-02-05 DIAGNOSIS — Z79890 Hormone replacement therapy (postmenopausal): Secondary | ICD-10-CM

## 2023-02-05 DIAGNOSIS — W010XXA Fall on same level from slipping, tripping and stumbling without subsequent striking against object, initial encounter: Secondary | ICD-10-CM | POA: Diagnosis present

## 2023-02-05 DIAGNOSIS — I129 Hypertensive chronic kidney disease with stage 1 through stage 4 chronic kidney disease, or unspecified chronic kidney disease: Secondary | ICD-10-CM | POA: Diagnosis present

## 2023-02-05 DIAGNOSIS — E1140 Type 2 diabetes mellitus with diabetic neuropathy, unspecified: Secondary | ICD-10-CM | POA: Diagnosis present

## 2023-02-05 DIAGNOSIS — I4892 Unspecified atrial flutter: Secondary | ICD-10-CM | POA: Diagnosis present

## 2023-02-05 DIAGNOSIS — E1122 Type 2 diabetes mellitus with diabetic chronic kidney disease: Secondary | ICD-10-CM | POA: Diagnosis present

## 2023-02-05 DIAGNOSIS — N179 Acute kidney failure, unspecified: Secondary | ICD-10-CM | POA: Diagnosis present

## 2023-02-05 DIAGNOSIS — E1165 Type 2 diabetes mellitus with hyperglycemia: Secondary | ICD-10-CM | POA: Diagnosis present

## 2023-02-05 DIAGNOSIS — Z7982 Long term (current) use of aspirin: Secondary | ICD-10-CM | POA: Diagnosis not present

## 2023-02-05 DIAGNOSIS — I4891 Unspecified atrial fibrillation: Secondary | ICD-10-CM | POA: Diagnosis present

## 2023-02-05 DIAGNOSIS — Z7984 Long term (current) use of oral hypoglycemic drugs: Secondary | ICD-10-CM | POA: Diagnosis not present

## 2023-02-05 DIAGNOSIS — Y93G3 Activity, cooking and baking: Secondary | ICD-10-CM | POA: Diagnosis not present

## 2023-02-05 DIAGNOSIS — S72001A Fracture of unspecified part of neck of right femur, initial encounter for closed fracture: Principal | ICD-10-CM | POA: Diagnosis present

## 2023-02-05 DIAGNOSIS — Z66 Do not resuscitate: Secondary | ICD-10-CM | POA: Diagnosis present

## 2023-02-05 DIAGNOSIS — Z79899 Other long term (current) drug therapy: Secondary | ICD-10-CM

## 2023-02-05 DIAGNOSIS — E039 Hypothyroidism, unspecified: Secondary | ICD-10-CM | POA: Diagnosis present

## 2023-02-05 DIAGNOSIS — N1831 Chronic kidney disease, stage 3a: Secondary | ICD-10-CM | POA: Diagnosis present

## 2023-02-05 DIAGNOSIS — W19XXXA Unspecified fall, initial encounter: Secondary | ICD-10-CM | POA: Diagnosis not present

## 2023-02-05 DIAGNOSIS — Y92129 Unspecified place in nursing home as the place of occurrence of the external cause: Secondary | ICD-10-CM | POA: Diagnosis not present

## 2023-02-05 DIAGNOSIS — S42291A Other displaced fracture of upper end of right humerus, initial encounter for closed fracture: Secondary | ICD-10-CM | POA: Diagnosis present

## 2023-02-05 DIAGNOSIS — I6310 Cerebral infarction due to embolism of unspecified precerebral artery: Secondary | ICD-10-CM | POA: Diagnosis not present

## 2023-02-05 DIAGNOSIS — Z7901 Long term (current) use of anticoagulants: Secondary | ICD-10-CM

## 2023-02-05 DIAGNOSIS — Z8673 Personal history of transient ischemic attack (TIA), and cerebral infarction without residual deficits: Secondary | ICD-10-CM

## 2023-02-05 DIAGNOSIS — W19XXXD Unspecified fall, subsequent encounter: Secondary | ICD-10-CM | POA: Diagnosis not present

## 2023-02-05 DIAGNOSIS — S42213A Unspecified displaced fracture of surgical neck of unspecified humerus, initial encounter for closed fracture: Secondary | ICD-10-CM | POA: Diagnosis present

## 2023-02-05 DIAGNOSIS — Z751 Person awaiting admission to adequate facility elsewhere: Secondary | ICD-10-CM | POA: Diagnosis not present

## 2023-02-05 DIAGNOSIS — D649 Anemia, unspecified: Secondary | ICD-10-CM | POA: Diagnosis not present

## 2023-02-05 DIAGNOSIS — D62 Acute posthemorrhagic anemia: Secondary | ICD-10-CM | POA: Diagnosis not present

## 2023-02-05 DIAGNOSIS — Y92120 Kitchen in nursing home as the place of occurrence of the external cause: Secondary | ICD-10-CM

## 2023-02-05 DIAGNOSIS — I639 Cerebral infarction, unspecified: Secondary | ICD-10-CM | POA: Diagnosis present

## 2023-02-05 LAB — CBC WITH DIFFERENTIAL/PLATELET
Abs Immature Granulocytes: 0.09 10*3/uL — ABNORMAL HIGH (ref 0.00–0.07)
Basophils Absolute: 0 10*3/uL (ref 0.0–0.1)
Basophils Relative: 0 %
Eosinophils Absolute: 0 10*3/uL (ref 0.0–0.5)
Eosinophils Relative: 0 %
HCT: 27.5 % — ABNORMAL LOW (ref 36.0–46.0)
Hemoglobin: 8.8 g/dL — ABNORMAL LOW (ref 12.0–15.0)
Immature Granulocytes: 1 %
Lymphocytes Relative: 7 %
Lymphs Abs: 0.8 10*3/uL (ref 0.7–4.0)
MCH: 28.2 pg (ref 26.0–34.0)
MCHC: 32 g/dL (ref 30.0–36.0)
MCV: 88.1 fL (ref 80.0–100.0)
Monocytes Absolute: 0.4 10*3/uL (ref 0.1–1.0)
Monocytes Relative: 4 %
Neutro Abs: 10 10*3/uL — ABNORMAL HIGH (ref 1.7–7.7)
Neutrophils Relative %: 88 %
Platelets: 215 10*3/uL (ref 150–400)
RBC: 3.12 MIL/uL — ABNORMAL LOW (ref 3.87–5.11)
RDW: 15 % (ref 11.5–15.5)
WBC: 11.3 10*3/uL — ABNORMAL HIGH (ref 4.0–10.5)
nRBC: 0 % (ref 0.0–0.2)

## 2023-02-05 LAB — COMPREHENSIVE METABOLIC PANEL
ALT: 31 U/L (ref 0–44)
AST: 36 U/L (ref 15–41)
Albumin: 3.1 g/dL — ABNORMAL LOW (ref 3.5–5.0)
Alkaline Phosphatase: 43 U/L (ref 38–126)
Anion gap: 18 — ABNORMAL HIGH (ref 5–15)
BUN: 27 mg/dL — ABNORMAL HIGH (ref 8–23)
CO2: 16 mmol/L — ABNORMAL LOW (ref 22–32)
Calcium: 8.9 mg/dL (ref 8.9–10.3)
Chloride: 102 mmol/L (ref 98–111)
Creatinine, Ser: 1.26 mg/dL — ABNORMAL HIGH (ref 0.44–1.00)
GFR, Estimated: 40 mL/min — ABNORMAL LOW (ref >60)
Glucose, Bld: 323 mg/dL — ABNORMAL HIGH (ref 70–99)
Potassium: 4.2 mmol/L (ref 3.5–5.1)
Sodium: 136 mmol/L (ref 135–145)
Total Bilirubin: 0.8 mg/dL (ref 0.3–1.2)
Total Protein: 5.4 g/dL — ABNORMAL LOW (ref 6.5–8.1)

## 2023-02-05 LAB — I-STAT VENOUS BLOOD GAS, ED
Acid-base deficit: 7 mmol/L — ABNORMAL HIGH (ref 0.0–2.0)
Bicarbonate: 15.2 mmol/L — ABNORMAL LOW (ref 20.0–28.0)
Calcium, Ion: 1.1 mmol/L — ABNORMAL LOW (ref 1.15–1.40)
HCT: 26 % — ABNORMAL LOW (ref 36.0–46.0)
Hemoglobin: 8.8 g/dL — ABNORMAL LOW (ref 12.0–15.0)
O2 Saturation: 87 %
Potassium: 4.3 mmol/L (ref 3.5–5.1)
Sodium: 136 mmol/L (ref 135–145)
TCO2: 16 mmol/L — ABNORMAL LOW (ref 22–32)
pCO2, Ven: 21.5 mmHg — ABNORMAL LOW (ref 44–60)
pH, Ven: 7.457 — ABNORMAL HIGH (ref 7.25–7.43)
pO2, Ven: 48 mmHg — ABNORMAL HIGH (ref 32–45)

## 2023-02-05 LAB — I-STAT CHEM 8, ED
BUN: 26 mg/dL — ABNORMAL HIGH (ref 8–23)
Calcium, Ion: 1.1 mmol/L — ABNORMAL LOW (ref 1.15–1.40)
Chloride: 103 mmol/L (ref 98–111)
Creatinine, Ser: 1 mg/dL (ref 0.44–1.00)
Glucose, Bld: 323 mg/dL — ABNORMAL HIGH (ref 70–99)
HCT: 26 % — ABNORMAL LOW (ref 36.0–46.0)
Hemoglobin: 8.8 g/dL — ABNORMAL LOW (ref 12.0–15.0)
Potassium: 4.2 mmol/L (ref 3.5–5.1)
Sodium: 136 mmol/L (ref 135–145)
TCO2: 16 mmol/L — ABNORMAL LOW (ref 22–32)

## 2023-02-05 LAB — GLUCOSE, CAPILLARY
Glucose-Capillary: 159 mg/dL — ABNORMAL HIGH (ref 70–99)
Glucose-Capillary: 254 mg/dL — ABNORMAL HIGH (ref 70–99)

## 2023-02-05 LAB — TYPE AND SCREEN
ABO/RH(D): O POS
Antibody Screen: NEGATIVE

## 2023-02-05 LAB — BASIC METABOLIC PANEL
Anion gap: 11 (ref 5–15)
BUN: 31 mg/dL — ABNORMAL HIGH (ref 8–23)
CO2: 20 mmol/L — ABNORMAL LOW (ref 22–32)
Calcium: 8.7 mg/dL — ABNORMAL LOW (ref 8.9–10.3)
Chloride: 104 mmol/L (ref 98–111)
Creatinine, Ser: 1.18 mg/dL — ABNORMAL HIGH (ref 0.44–1.00)
GFR, Estimated: 44 mL/min — ABNORMAL LOW (ref >60)
Glucose, Bld: 176 mg/dL — ABNORMAL HIGH (ref 70–99)
Potassium: 3.9 mmol/L (ref 3.5–5.1)
Sodium: 135 mmol/L (ref 135–145)

## 2023-02-05 LAB — PROTIME-INR
INR: 1.7 — ABNORMAL HIGH (ref 0.8–1.2)
Prothrombin Time: 19.7 seconds — ABNORMAL HIGH (ref 11.4–15.2)

## 2023-02-05 LAB — CBG MONITORING, ED: Glucose-Capillary: 318 mg/dL — ABNORMAL HIGH (ref 70–99)

## 2023-02-05 LAB — ABO/RH: ABO/RH(D): O POS

## 2023-02-05 MED ORDER — METOPROLOL SUCCINATE ER 25 MG PO TB24
25.0000 mg | ORAL_TABLET | Freq: Every day | ORAL | Status: DC
  Administered 2023-02-05 – 2023-02-11 (×7): 25 mg via ORAL
  Filled 2023-02-05 (×7): qty 1

## 2023-02-05 MED ORDER — IOHEXOL 350 MG/ML SOLN
75.0000 mL | Freq: Once | INTRAVENOUS | Status: AC | PRN
  Administered 2023-02-05: 75 mL via INTRAVENOUS

## 2023-02-05 MED ORDER — POLYETHYLENE GLYCOL 3350 17 G PO PACK
17.0000 g | PACK | Freq: Every day | ORAL | Status: DC | PRN
  Administered 2023-02-08 – 2023-02-10 (×2): 17 g via ORAL
  Filled 2023-02-05 (×2): qty 1

## 2023-02-05 MED ORDER — METHOCARBAMOL 500 MG PO TABS: 500.0000 mg | ORAL_TABLET | Freq: Four times a day (QID) | ORAL | Status: DC | PRN

## 2023-02-05 MED ORDER — MORPHINE SULFATE (PF) 2 MG/ML IV SOLN: 2.0000 mg | INTRAVENOUS | Status: DC | PRN

## 2023-02-05 MED ORDER — INSULIN ASPART 100 UNIT/ML IJ SOLN
0.0000 [IU] | Freq: Three times a day (TID) | INTRAMUSCULAR | Status: DC
  Administered 2023-02-05: 7 [IU] via SUBCUTANEOUS
  Administered 2023-02-05: 2 [IU] via SUBCUTANEOUS
  Administered 2023-02-06: 5 [IU] via SUBCUTANEOUS
  Administered 2023-02-06 (×2): 3 [IU] via SUBCUTANEOUS
  Administered 2023-02-07: 2 [IU] via SUBCUTANEOUS
  Administered 2023-02-07 – 2023-02-08 (×3): 3 [IU] via SUBCUTANEOUS
  Administered 2023-02-08: 5 [IU] via SUBCUTANEOUS
  Administered 2023-02-08: 3 [IU] via SUBCUTANEOUS
  Administered 2023-02-09: 1 [IU] via SUBCUTANEOUS
  Administered 2023-02-09: 2 [IU] via SUBCUTANEOUS
  Administered 2023-02-09 – 2023-02-10 (×2): 3 [IU] via SUBCUTANEOUS
  Administered 2023-02-10: 5 [IU] via SUBCUTANEOUS
  Administered 2023-02-10: 2 [IU] via SUBCUTANEOUS
  Administered 2023-02-11: 3 [IU] via SUBCUTANEOUS
  Administered 2023-02-11: 2 [IU] via SUBCUTANEOUS
  Filled 2023-02-05: qty 1

## 2023-02-05 MED ORDER — AMLODIPINE BESYLATE 5 MG PO TABS
5.0000 mg | ORAL_TABLET | Freq: Every day | ORAL | Status: DC
  Administered 2023-02-05 – 2023-02-11 (×7): 5 mg via ORAL
  Filled 2023-02-05 (×7): qty 1

## 2023-02-05 MED ORDER — DEXAMETHASONE SODIUM PHOSPHATE 4 MG/ML IJ SOLN
INTRAMUSCULAR | Status: DC | PRN
  Administered 2023-02-05: 10 mg via PERINEURAL

## 2023-02-05 MED ORDER — HYDRALAZINE HCL 50 MG PO TABS
50.0000 mg | ORAL_TABLET | Freq: Two times a day (BID) | ORAL | Status: DC
  Administered 2023-02-05 – 2023-02-11 (×13): 50 mg via ORAL
  Filled 2023-02-05 (×13): qty 1

## 2023-02-05 MED ORDER — DOCUSATE SODIUM 100 MG PO CAPS
100.0000 mg | ORAL_CAPSULE | Freq: Two times a day (BID) | ORAL | Status: DC
  Administered 2023-02-05 (×2): 100 mg via ORAL
  Filled 2023-02-05 (×2): qty 1

## 2023-02-05 MED ORDER — LEVOTHYROXINE SODIUM 88 MCG PO TABS
88.0000 ug | ORAL_TABLET | Freq: Every day | ORAL | Status: DC
  Administered 2023-02-05 – 2023-02-11 (×7): 88 ug via ORAL
  Filled 2023-02-05 (×7): qty 1

## 2023-02-05 MED ORDER — OXYCODONE HCL 5 MG PO TABS: 5.0000 mg | ORAL_TABLET | ORAL | Status: DC | PRN

## 2023-02-05 MED ORDER — FENTANYL CITRATE PF 50 MCG/ML IJ SOSY
50.0000 ug | PREFILLED_SYRINGE | INTRAMUSCULAR | Status: DC | PRN
  Administered 2023-02-05: 50 ug via INTRAVENOUS
  Filled 2023-02-05: qty 1

## 2023-02-05 MED ORDER — BISACODYL 5 MG PO TBEC
5.0000 mg | DELAYED_RELEASE_TABLET | Freq: Every day | ORAL | Status: DC | PRN
  Administered 2023-02-09 – 2023-02-10 (×2): 5 mg via ORAL
  Filled 2023-02-05 (×2): qty 1

## 2023-02-05 MED ORDER — ROPIVACAINE HCL 5 MG/ML IJ SOLN
INTRAMUSCULAR | Status: DC | PRN
  Administered 2023-02-05: 20 mL via PERINEURAL

## 2023-02-05 MED ORDER — METHOCARBAMOL 1000 MG/10ML IJ SOLN: 500.0000 mg | Freq: Four times a day (QID) | INTRAVENOUS | Status: DC | PRN

## 2023-02-05 MED ORDER — SODIUM CHLORIDE 0.9 % IV BOLUS
500.0000 mL | Freq: Once | INTRAVENOUS | Status: AC
  Administered 2023-02-05: 500 mL via INTRAVENOUS

## 2023-02-05 NOTE — Plan of Care (Signed)

## 2023-02-05 NOTE — Progress Notes (Addendum)
Patient has arrived to  the room via bed from PACU.  A/O x 4. No pain complained at this time. VSS. Placed on tele. Oriented patient to the room and staff. Education provided regarding safety precaution and patient verbalize understanding.   Family is at the bedside.

## 2023-02-05 NOTE — ED Notes (Signed)
Meds not given yet at this time d/t not being verified by pharmacy.

## 2023-02-05 NOTE — ED Notes (Signed)
CT called for table availability. CT states both tables are full and stated they will notify this RN asap.

## 2023-02-05 NOTE — H&P (View-Only) (Signed)
Reason for Consult:Right humerus and hip fxs Referring Physician: Jonah Blue Time called: 1096 Time at bedside: 0454   Barbara Butler is an 89 y.o. female.  HPI: Barbara Butler was standing at a counter preparing her supper when she lost her balance and fell backwards. She's not sure what caused her to fall. She had immediate right shoulder and hip pain. She managed to get up but could not ambulate and was brought to the ED. X-rays showed a proximal humerus and intertroch fx and orthopedic surgery was consulted. She lives at [facility].  Past Medical History:  Diagnosis Date   Anemia    Diabetes mellitus without complication (HCC)    diet controlled   Diabetic neuropathy (HCC)    Dysrhythmia    Hypertension    Hypothyroidism     Past Surgical History:  Procedure Laterality Date   CATARACT EXTRACTION W/PHACO Right 01/28/2019   Procedure: CATARACT EXTRACTION PHACO AND INTRAOCULAR LENS PLACEMENT (IOC) RIGHT DIABETES;  Surgeon: Elliot Cousin, MD;  Location: [facility] ORS;  Service: Ophthalmology;  Laterality: Right;  [place] 00:49.3 CDE 8.12 Fluid Pack Lot # [id number]   CATARACT EXTRACTION W/PHACO Left 02/25/2019   Procedure: CATARACT EXTRACTION PHACO AND INTRAOCULAR LENS PLACEMENT (IOC), LEFT, DIABETIC, VISION BLUE;  Surgeon: Elliot Cousin, MD;  Location: [facility] ORS;  Service: Ophthalmology;  Laterality: Left;  [place]  01:10 CDE 11.43 Fluid pack lot # [id number]   FRACTURE SURGERY     ORIF WRIST FRACTURE Right 06/28/2016   Procedure: OPEN REDUCTION INTERNAL FIXATION (ORIF) WRIST FRACTURE;  Surgeon: Kennedy Bucker, MD;  Location: [facility] ORS;  Service: Orthopedics;  Laterality: Right;   SALPINGECTOMY      Family History  Problem Relation Age of Onset   Breast cancer Neg Hx     Social History:  reports that she quit smoking about 64 years ago. She has never used smokeless tobacco. She reports that she does not currently use alcohol. She reports that she does not use drugs.  Allergies: No Known  Allergies  Medications: I have reviewed the patient's current medications.  Results for orders placed or performed during the hospital encounter of 02/05/23 (from the past 48 hour(s))  Type and screen [facility]     Status: None   Collection Time: 02/05/23  5:22 AM  Result Value Ref Range   ABO/RH(D) O POS    Antibody Screen NEG    Sample Expiration      02/08/2023,2359 Performed at [facility], [address], [place], [place] [address]   I-stat chem 8, ed     Status: Abnormal   Collection Time: 02/05/23  5:31 AM  Result Value Ref Range   Sodium 136 135 - 145 mmol/L   Potassium 4.2 3.5 - 5.1 mmol/L   Chloride 103 98 - 111 mmol/L   BUN 26 (H) 8 - 23 mg/dL   Creatinine, Ser 5.62 0.44 - 1.00 mg/dL   Glucose, Bld 130 (H) 70 - 99 mg/dL    Comment: Glucose reference range applies only to samples taken after fasting for at least 8 hours.   Calcium, Ion 1.10 (L) 1.15 - 1.40 mmol/L   TCO2 16 (L) 22 - 32 mmol/L   Hemoglobin 8.8 (L) 12.0 - 15.0 g/dL   HCT 86.5 (L) 78.4 - 69.6 %  CBC with Differential     Status: Abnormal   Collection Time: 02/05/23  5:32 AM  Result Value Ref Range   WBC 11.3 (H) 4.0 - 10.5 K/uL  RBC 3.12 (L) 3.87 - 5.11 MIL/uL   Hemoglobin 8.8 (L) 12.0 - 15.0 g/dL   HCT 16.1 (L) 09.6 - 04.5 %   MCV 88.1 80.0 - 100.0 fL   MCH 28.2 26.0 - 34.0 pg   MCHC 32.0 30.0 - 36.0 g/dL   RDW 40.9 81.1 - 91.4 %   Platelets 215 150 - 400 K/uL   nRBC 0.0 0.0 - 0.2 %   Neutrophils Relative % 88 %   Neutro Abs 10.0 (H) 1.7 - 7.7 K/uL   Lymphocytes Relative 7 %   Lymphs Abs 0.8 0.7 - 4.0 K/uL   Monocytes Relative 4 %   Monocytes Absolute 0.4 0.1 - 1.0 K/uL   Eosinophils Relative 0 %   Eosinophils Absolute 0.0 0.0 - 0.5 K/uL   Basophils Relative 0 %   Basophils Absolute 0.0 0.0 - 0.1 K/uL   Immature Granulocytes 1 %   Abs Immature Granulocytes 0.09 (H) 0.00 - 0.07 K/uL    Comment: Performed at [facility], [address], [place], [place]  [address]  Comprehensive metabolic panel     Status: Abnormal   Collection Time: 02/05/23  5:32 AM  Result Value Ref Range   Sodium 136 135 - 145 mmol/L   Potassium 4.2 3.5 - 5.1 mmol/L   Chloride 102 98 - 111 mmol/L   CO2 16 (L) 22 - 32 mmol/L   Glucose, Bld 323 (H) 70 - 99 mg/dL    Comment: Glucose reference range applies only to samples taken after fasting for at least 8 hours.   BUN 27 (H) 8 - 23 mg/dL   Creatinine, Ser 6.21 (H) 0.44 - 1.00 mg/dL   Calcium 8.9 8.9 - 30.8 mg/dL   Total Protein 5.4 (L) 6.5 - 8.1 g/dL   Albumin 3.1 (L) 3.5 - 5.0 g/dL   AST 36 15 - 41 U/L   ALT 31 0 - 44 U/L   Alkaline Phosphatase 43 38 - 126 U/L   Total Bilirubin 0.8 0.3 - 1.2 mg/dL   GFR, Estimated 40 (L) >60 mL/min    Comment: (NOTE) Calculated using the CKD-EPI Creatinine Equation (2021)    Anion gap 18 (H) 5 - 15    Comment: Performed at [facility], [address], [place], [place] [address]  Protime-INR     Status: Abnormal   Collection Time: 02/05/23  5:32 AM  Result Value Ref Range   Prothrombin Time 19.7 (H) 11.4 - 15.2 seconds   INR 1.7 (H) 0.8 - 1.2    Comment: (NOTE) INR goal varies based on device and disease states. Performed at [facility], [address] [place], [place] [address]   ABO/Rh     Status: None   Collection Time: 02/05/23  5:34 AM  Result Value Ref Range   ABO/RH(D)      O POS Performed at [facility], [address] [place], [place] [address]   I-Stat venous blood gas, ED     Status: Abnormal   Collection Time: 02/05/23  5:43 AM  Result Value Ref Range   pH, Ven 7.457 (H) 7.25 - 7.43   pCO2, Ven 21.5 (L) 44 - 60 mmHg   pO2, Ven 48 (H) 32 - 45 mmHg   Bicarbonate 15.2 (L) 20.0 - 28.0 mmol/L   TCO2 16 (L) 22 - 32 mmol/L   O2 Saturation 87 %   Acid-base deficit 7.0 (H) 0.0 - 2.0 mmol/L   Sodium  136 135 - 145 mmol/L   Potassium 4.3 3.5 - 5.1 mmol/L   Calcium, Ion 1.10 (L) 1.15 - 1.40 mmol/L   HCT 26.0 (L) 36.0 - 46.0 %   Hemoglobin  8.8 (L) 12.0 - 15.0 g/dL   Sample type VENOUS     CT CHEST ABDOMEN PELVIS W CONTRAST  Result Date: 02/05/2023 CLINICAL DATA:  One poly trauma. EXAM: CT CHEST, ABDOMEN, AND PELVIS WITH CONTRAST TECHNIQUE: Multidetector CT imaging of the chest, abdomen and pelvis was performed following the standard protocol during bolus administration of intravenous contrast. RADIATION DOSE REDUCTION: This exam was performed according to the departmental dose-optimization program which includes automated exposure control, adjustment of the mA and/or kV according to patient size and/or use of iterative reconstruction technique. CONTRAST:  75mL OMNIPAQUE IOHEXOL 350 MG/ML SOLN COMPARISON:  None Available. FINDINGS: CT CHEST FINDINGS Cardiovascular: Biatrial enlargement. No pericardial effusion. No evidence of great vessel injury. Extensive atheromatous plaque affecting the aorta and great vessels. Mediastinum/Nodes: No hematoma or pneumomediastinum Lungs/Pleura: No hemothorax, pneumothorax, or pulmonary contusion. Mild subpleural reticulation/scarring. Musculoskeletal: Impacted right humeral neck fracture also involving the lesser tuberosity. There is a lucency at the posterior glenoid on the right which appears corticated and chronic. Right glenohumeral osteoarthritis. Hemorrhage/edema involves the right upper extremity soft tissues around the fracture. CT ABDOMEN PELVIS FINDINGS Hepatobiliary: No hepatic injury or perihepatic hematoma. Elongated calcified stone in the gallbladder. Thickening with some cystic change at the fundus of the gallbladder, usually adenomyomatosis. Liver cyst measuring up to 4.2 cm in the left lobe. Pancreas: No acute finding. Diffuse dilatation of the main duct measuring up to 6 mm, new from 2013. No underlying mass or cyst is seen. Generalized pancreatic atrophy. Spleen: No splenic injury or perisplenic hematoma. Adrenals/Urinary Tract: No adrenal hemorrhage or renal injury identified. There is a  wedge-shaped area of hypoenhancing cortex at the upper pole left kidney measuring up to 3 cm on the delayed phase, without distortion of the capsule. Renal cysts on both sides measuring up to 4 cm. Bladder is unremarkable. Stomach/Bowel: No evidence of injury. Innumerable distal colonic diverticula. Vascular/Lymphatic: Atheromatous plaque.  No acute vascular finding. Reproductive: Unremarkable for age Other: No ascites or pneumoperitoneum. Fatty midline hernia at the umbilicus. Musculoskeletal: Acute intertrochanteric right femur fracture with extensive swelling in the upper right thigh. 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 Chronic endplate/compression fractures affecting L1, L3, and L5. IMPRESSION: 1. Acute fractures of the proximal right humeral neck and intertrochanteric right femur. 2. No intrathoracic or intra-abdominal injury is seen. 3. Diffuse pancreatic ductal dilatation since 2013. No visible underlying mass, consider pancreas MRI follow-up if appropriate for comorbidities. 4. Wedge-shaped area heterogeneous/hypoenhancement in the left upper pole kidney, morphology favoring infarct over infection or lesion. Attention at any follow-up. 5. Numerous chronic findings are described above. Electronically Signed   By: Tiburcio Pea M.D.   On: 02/05/2023 06:40   DG Chest Port 1 View  Result Date: 02/05/2023 CLINICAL DATA:  Fall with femur fracture. EXAM: PORTABLE CHEST 1 VIEW COMPARISON:  01/26/2023 FINDINGS: Right humeral neck fracture, there is pending shoulder radiograph. Normal heart size and mediastinal contours. Mitral annular calcification. There is no edema, consolidation, effusion, or pneumothorax. Artifact from EKG leads. IMPRESSION: 1. No evidence of acute cardiopulmonary disease. 2. Right proximal humerus fracture. Electronically Signed   By: Tiburcio Pea M.D.   On: 02/05/2023 06:24   DG Hip Unilat W or Wo Pelvis 2-3 Views Right  Result Date: 02/05/2023 CLINICAL DATA:   Fall  with right hip pain EXAM: DG HIP (WITH OR WITHOUT PELVIS) 3V RIGHT COMPARISON:  None Available. FINDINGS: Acute intertrochanteric right femur fracture with comminution and varus deformity. Both hips are located. There is pelvic bone osteopenia without acute fracture. Remote and nonunited left greater trochanter fracture with corticated margins. Degenerative spurring at both sacroiliac joints. IMPRESSION: 1. Acute intertrochanteric right femur fracture. 2. Chronic left greater trochanter femur fracture. Electronically Signed   By: Tiburcio Pea M.D.   On: 02/05/2023 06:23   DG Shoulder Right  Result Date: 02/05/2023 CLINICAL DATA:  Status post fall.  Complains of right shoulder pain. EXAM: RIGHT SHOULDER - 2+ VIEW COMPARISON:  None Available. FINDINGS: There is an acute, impacted, comminuted fracture deformity involving the surgical neck of the humerus. No signs of dislocation. IMPRESSION: Acute, impacted, comminuted fracture deformity involving the surgical neck of the humerus. Electronically Signed   By: Signa Kell M.D.   On: 02/05/2023 06:22   CT Head Wo Contrast  Result Date: 02/05/2023 CLINICAL DATA:  Neck trauma.  Minor head trauma EXAM: CT HEAD WITHOUT CONTRAST CT CERVICAL SPINE WITHOUT CONTRAST TECHNIQUE: Multidetector CT imaging of the head and cervical spine was performed following the standard protocol without intravenous contrast. Multiplanar CT image reconstructions of the cervical spine were also generated. RADIATION DOSE REDUCTION: This exam was performed according to the departmental dose-optimization program which includes automated exposure control, adjustment of the mA and/or kV according to patient size and/or use of iterative reconstruction technique. COMPARISON:  01/26/2023 FINDINGS: CT HEAD FINDINGS Brain: No evidence of acute infarction, hemorrhage, hydrocephalus, extra-axial collection or mass lesion/mass effect. Generalized atrophy. Fairly extensive chronic small vessel  ischemic low-density in the cerebral white matter and deep gray nuclei. Vascular: No hyperdense vessel or unexpected calcification. Skull: No acute finding. Small inner table osteoma at the left middle cranial fossa. Sinuses/Orbits: No evidence of injury CT CERVICAL SPINE FINDINGS Alignment: Normal. Skull base and vertebrae: No acute fracture. No primary bone lesion or focal pathologic process. Soft tissues and spinal canal: No prevertebral fluid or swelling. No visible canal hematoma. Disc levels:  Ordinary cervical spine degeneration. Upper chest: Clear apical lungs IMPRESSION: No evidence of acute intracranial or cervical spine injury. Electronically Signed   By: Tiburcio Pea M.D.   On: 02/05/2023 06:09   CT Cervical Spine Wo Contrast  Result Date: 02/05/2023 CLINICAL DATA:  Neck trauma.  Minor head trauma EXAM: CT HEAD WITHOUT CONTRAST CT CERVICAL SPINE WITHOUT CONTRAST TECHNIQUE: Multidetector CT imaging of the head and cervical spine was performed following the standard protocol without intravenous contrast. Multiplanar CT image reconstructions of the cervical spine were also generated. RADIATION DOSE REDUCTION: This exam was performed according to the departmental dose-optimization program which includes automated exposure control, adjustment of the mA and/or kV according to patient size and/or use of iterative reconstruction technique. COMPARISON:  01/26/2023 FINDINGS: CT HEAD FINDINGS Brain: No evidence of acute infarction, hemorrhage, hydrocephalus, extra-axial collection or mass lesion/mass effect. Generalized atrophy. Fairly extensive chronic small vessel ischemic low-density in the cerebral white matter and deep gray nuclei. Vascular: No hyperdense vessel or unexpected calcification. Skull: No acute finding. Small inner table osteoma at the left middle cranial fossa. Sinuses/Orbits: No evidence of injury CT CERVICAL SPINE FINDINGS Alignment: Normal. Skull base and vertebrae: No acute fracture. No  primary bone lesion or focal pathologic process. Soft tissues and spinal canal: No prevertebral fluid or swelling. No visible canal hematoma. Disc levels:  Ordinary cervical spine degeneration. Upper chest: Clear apical lungs IMPRESSION:  No evidence of acute intracranial or cervical spine injury. Electronically Signed   By: Tiburcio Pea M.D.   On: 02/05/2023 06:09    Review of Systems  HENT:  Negative for ear discharge, ear pain, hearing loss and tinnitus.   Eyes:  Negative for photophobia and pain.  Respiratory:  Negative for cough and shortness of breath.   Cardiovascular:  Negative for chest pain.  Gastrointestinal:  Negative for abdominal pain, nausea and vomiting.  Genitourinary:  Negative for dysuria, flank pain, frequency and urgency.  Musculoskeletal:  Positive for arthralgias (Right hip and shoulder). Negative for back pain, myalgias and neck pain.  Neurological:  Negative for dizziness and headaches.  Hematological:  Does not bruise/bleed easily.  Psychiatric/Behavioral:  The patient is not nervous/anxious.    Blood pressure 127/82, pulse (!) 107, temperature 98 F (36.7 C), temperature source Oral, resp. rate 13, height 5\' 2"  (1.575 m), weight 55 kg, SpO2 100%. Physical Exam Constitutional:      General: She is not in acute distress.    Appearance: She is well-developed. She is not diaphoretic.  HENT:     Head: Normocephalic and atraumatic.  Eyes:     General: No scleral icterus.       Right eye: No discharge.        Left eye: No discharge.     Conjunctiva/sclera: Conjunctivae normal.  Cardiovascular:     Rate and Rhythm: Normal rate and regular rhythm.  Pulmonary:     Effort: Pulmonary effort is normal. No respiratory distress.  Musculoskeletal:     Cervical back: Normal range of motion.     Comments: Right shoulder, elbow, wrist, digits- no skin wounds, in sling, no instability, no blocks to motion  Sens  Ax/R/M/U intact  Mot   Ax/ R/ PIN/ M/ AIN/ U intact  Rad  2+  RLE No traumatic wounds, ecchymosis, or rash  Mild TTP hip  No knee or ankle effusion  Knee stable to varus/ valgus and anterior/posterior stress  Sens DPN, SPN, TN intact  Motor EHL, ext, flex, evers 5/5  DP 1+, PT 0, No significant edema  Skin:    General: Skin is warm and dry.  Neurological:     Mental Status: She is alert.  Psychiatric:        Mood and Affect: Mood normal.        Behavior: Behavior normal.    Assessment/Plan: Right humerus fx -- Plan non-operative management with sling and NWB. Right hip fx -- Plan IMN, likely tomorrow with Dr. Jena Gauss but may be Friday or Saturday.    Freeman Caldron, PA-C Orthopedic Surgery [phone] 02/05/2023, 9:28 AM

## 2023-02-05 NOTE — Anesthesia Preprocedure Evaluation (Signed)
Anesthesia Evaluation  Patient identified by MRN, date of birth, ID band Patient awake    Reviewed: Allergy & Precautions, NPO status , Patient's Chart, lab work & pertinent test results, reviewed documented beta blocker date and time   Airway Mallampati: II  TM Distance: >3 FB Neck ROM: Full    Dental  (+) Teeth Intact, Dental Advisory Given   Pulmonary former smoker   Pulmonary exam normal breath sounds clear to auscultation       Cardiovascular hypertension, Pt. on medications and Pt. on home beta blockers + dysrhythmias Atrial Fibrillation  Rhythm:Irregular Rate:Abnormal     Neuro/Psych negative neurological ROS     GI/Hepatic negative GI ROS, Neg liver ROS,,,  Endo/Other  diabetes, Type 2, Oral Hypoglycemic AgentsHypothyroidism    Renal/GU Renal disease (AKI)     Musculoskeletal negative musculoskeletal ROS (+)  Right femur fracture    Abdominal   Peds  Hematology  (+) Blood dyscrasia (Eliquis), anemia   Anesthesia Other Findings Day of surgery medications reviewed with the patient.  Reproductive/Obstetrics                              Anesthesia Physical Anesthesia Plan  ASA: 3  Anesthesia Plan: Regional   Post-op Pain Management: Regional block*   Induction:   PONV Risk Score and Plan: 2 and Treatment may vary due to age or medical condition  Airway Management Planned: Natural Airway  Additional Equipment:   Intra-op Plan:   Post-operative Plan:   Informed Consent: I have reviewed the patients History and Physical, chart, labs and discussed the procedure including the risks, benefits and alternatives for the proposed anesthesia with the patient or authorized representative who has indicated his/her understanding and acceptance.     Dental advisory given  Plan Discussed with: CRNA  Anesthesia Plan Comments:          Anesthesia Quick Evaluation

## 2023-02-05 NOTE — H&P (Signed)
History and Physical    Patient: Barbara Butler:[id number] DOB: 05/07/1930 DOA: 02/05/2023 DOS: the patient was seen and examined on 02/05/2023 PCP: [name], MD  Patient coming from: SNF - [facility] rehab; NOK: DIL, [phone]   Chief Complaint: Fall  HPI: Barbara Butler is a 89 y.o. female with medical history significant of DM, HTN, hypothyroidism, recent CVA, and afib on Eliquis presenting with a fall.  She was last hospitalized from 7/21-22 for subacute CVA and was discharged to [facility].  She reports an accident yesterday - she fell and hurt her R shoulder and R leg.  She was diagnosed with afib during last hospitalization, started on Eliquis.  She was feeling well before the fall.  She was not light-headed or dizzy.  Rehab was going very well.  She did have generalized weakness following the stroke but no focal deficits.      ER Course:  Fall at facility.  Hip and humeral neck fractures.  Hgb 13 -> 8.8.  Afib in 110s.  Dr. Susa Simmonds will see, keep NPO.     Review of Systems: As mentioned in the history of present illness. All other systems reviewed and are negative. Past Medical History:  Diagnosis Date   Anemia    Diabetes mellitus without complication (HCC)    diet controlled   Diabetic neuropathy (HCC)    Dysrhythmia    Hypertension    Hypothyroidism    Past Surgical History:  Procedure Laterality Date   CATARACT EXTRACTION W/PHACO Right 01/28/2019   Procedure: CATARACT EXTRACTION PHACO AND INTRAOCULAR LENS PLACEMENT (IOC) RIGHT DIABETES;  Surgeon: Elliot Cousin, MD;  Location: [facility] ORS;  Service: Ophthalmology;  Laterality: Right;  [place] 00:49.3 CDE 8.12 Fluid Pack Lot # [id number]   CATARACT EXTRACTION W/PHACO Left 02/25/2019   Procedure: CATARACT EXTRACTION PHACO AND INTRAOCULAR LENS PLACEMENT (IOC), LEFT, DIABETIC, VISION BLUE;  Surgeon: Elliot Cousin, MD;  Location: [facility] ORS;  Service: Ophthalmology;  Laterality: Left;  [place]  01:10 CDE 11.43 Fluid  pack lot # [id number]   FRACTURE SURGERY     ORIF WRIST FRACTURE Right 06/28/2016   Procedure: OPEN REDUCTION INTERNAL FIXATION (ORIF) WRIST FRACTURE;  Surgeon: Kennedy Bucker, MD;  Location: [facility] ORS;  Service: Orthopedics;  Laterality: Right;   SALPINGECTOMY     Social History:  reports that she quit smoking about 64 years ago. She has never used smokeless tobacco. She reports that she does not currently use alcohol. She reports that she does not use drugs.  No Known Allergies  Family History  Problem Relation Age of Onset   Breast cancer Neg Hx     Prior to Admission medications   Medication Sig Start Date End Date Taking? Authorizing Provider  amLODipine (NORVASC) 5 MG tablet Take 5 mg by mouth daily.    [provider]  apixaban (ELIQUIS) 2.5 MG TABS tablet Take 1 tablet (2.5 mg total) by mouth 2 (two) times daily. 01/27/23   Arnetha Courser, MD  Ascorbic Acid (VITAMIN C) 1000 MG tablet Take 1,000 mg by mouth daily at 12 noon.    [provider]  aspirin EC 81 MG tablet Take 81 mg by mouth daily at 12 noon.    [provider]  cholecalciferol (VITAMIN D3) 25 MCG (1000 UT) tablet Take 1,000 Units by mouth daily at 12 noon.    [provider]  hydrALAZINE (APRESOLINE) 50 MG tablet Take 50 mg by mouth 2 (two) times a day.    [provider]  levothyroxine (SYNTHROID, LEVOTHROID) 88 MCG tablet Take 88 mcg by mouth daily.  12/05/15   [provider]  MEGARED OMEGA-3 KRILL OIL PO Take 750 mg by mouth daily at 12 noon.    [provider]  metFORMIN (GLUCOPHAGE) 500 MG tablet Take 500 mg by mouth 2 (two) times daily.    [provider]  metoprolol succinate (TOPROL-XL) 25 MG 24 hr tablet Take 1 tablet by mouth daily. 12/04/22 12/04/23  [provider]  olmesartan (BENICAR) 40 MG tablet Take 40 mg by mouth daily.    [provider]    Physical Exam: Vitals:   02/05/23 1619 02/05/23 1630 02/05/23 1639 02/05/23  1704  BP: 117/79 124/75 (!) 119/94 124/72  Pulse: (!) 117 (!) 110 (!) 108 (!) 114  Resp: 15 16 20 20   Temp: 97.9 F (36.6 C)   97.7 F (36.5 C)  TempSrc:    Oral  SpO2: 95% 96% 94% 94%  Weight:      Height:       General:  Appears calm and comfortable and is in NAD; hip/shoulder pain with movement Eyes:  EOMI, normal lids, iris ENT: hard of hearing,  grossly normal lips & tongue, mmm Neck:  no LAD, masses or thyromegaly Cardiovascular:  Irregularly irregular with tachycardia, no m/r/g. No LE edema.  Respiratory:   CTA bilaterally with no wheezes/rales/rhonchi.  Normal respiratory effort.   Abdomen:  soft, NT, ND, NABS Skin:  no rash or induration seen on limited exam Musculoskeletal:  R leg is shortened and externally rotated; R arm is in shoulder sling Psychiatric:  grossly normal mood and affect, speech fluent and appropriate, A&O x 3 Neurologic:  CN 2-12 grossly intact, moves all extremities in coordinated fashion other than RLE/RUE   Radiological Exams on Admission: Independently reviewed - see discussion in A/P where applicable  CT CHEST ABDOMEN PELVIS W CONTRAST  Result Date: 02/05/2023 CLINICAL DATA:  One poly trauma. EXAM: CT CHEST, ABDOMEN, AND PELVIS WITH CONTRAST TECHNIQUE: Multidetector CT imaging of the chest, abdomen and pelvis was performed following the standard protocol during bolus administration of intravenous contrast. RADIATION DOSE REDUCTION: This exam was performed according to the departmental dose-optimization program which includes automated exposure control, adjustment of the mA and/or kV according to patient size and/or use of iterative reconstruction technique. CONTRAST:  75mL OMNIPAQUE IOHEXOL 350 MG/ML SOLN COMPARISON:  None Available. FINDINGS: CT CHEST FINDINGS Cardiovascular: Biatrial enlargement. No pericardial effusion. No evidence of great vessel injury. Extensive atheromatous plaque affecting the aorta and great vessels. Mediastinum/Nodes: No  hematoma or pneumomediastinum Lungs/Pleura: No hemothorax, pneumothorax, or pulmonary contusion. Mild subpleural reticulation/scarring. Musculoskeletal: Impacted right humeral neck fracture also involving the lesser tuberosity. There is a lucency at the posterior glenoid on the right which appears corticated and chronic. Right glenohumeral osteoarthritis. Hemorrhage/edema involves the right upper extremity soft tissues around the fracture. CT ABDOMEN PELVIS FINDINGS Hepatobiliary: No hepatic injury or perihepatic hematoma. Elongated calcified stone in the gallbladder. Thickening with some cystic change at the fundus of the gallbladder, usually adenomyomatosis. Liver cyst measuring up to 4.2 cm in the left lobe. Pancreas: No acute finding. Diffuse dilatation of the main duct measuring up to 6 mm, new from 2013. No underlying mass or cyst is seen. Generalized pancreatic atrophy. Spleen: No splenic injury or perisplenic hematoma. Adrenals/Urinary Tract: No adrenal hemorrhage or renal injury identified. There is a wedge-shaped area of hypoenhancing cortex at the upper pole left kidney measuring up to 3 cm on the  delayed phase, without distortion of the capsule. Renal cysts on both sides measuring up to 4 cm. Bladder is unremarkable. Stomach/Bowel: No evidence of injury. Innumerable distal colonic diverticula. Vascular/Lymphatic: Atheromatous plaque.  No acute vascular finding. Reproductive: Unremarkable for age Other: No ascites or pneumoperitoneum. Fatty midline hernia at the umbilicus. Musculoskeletal: Acute intertrochanteric right femur fracture with extensive swelling in the upper right thigh. 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 Chronic endplate/compression fractures affecting L1, L3, and L5. IMPRESSION: 1. Acute fractures of the proximal right humeral neck and intertrochanteric right femur. 2. No intrathoracic or intra-abdominal injury is seen. 3. Diffuse pancreatic ductal dilatation since 2013.  No visible underlying mass, consider pancreas MRI follow-up if appropriate for comorbidities. 4. Wedge-shaped area heterogeneous/hypoenhancement in the left upper pole kidney, morphology favoring infarct over infection or lesion. Attention at any follow-up. 5. Numerous chronic findings are described above. Electronically Signed   By: Tiburcio Pea M.D.   On: 02/05/2023 06:40   DG Chest Port 1 View  Result Date: 02/05/2023 CLINICAL DATA:  Fall with femur fracture. EXAM: PORTABLE CHEST 1 VIEW COMPARISON:  01/26/2023 FINDINGS: Right humeral neck fracture, there is pending shoulder radiograph. Normal heart size and mediastinal contours. Mitral annular calcification. There is no edema, consolidation, effusion, or pneumothorax. Artifact from EKG leads. IMPRESSION: 1. No evidence of acute cardiopulmonary disease. 2. Right proximal humerus fracture. Electronically Signed   By: Tiburcio Pea M.D.   On: 02/05/2023 06:24   DG Hip Unilat W or Wo Pelvis 2-3 Views Right  Result Date: 02/05/2023 CLINICAL DATA:  Fall with right hip pain EXAM: DG HIP (WITH OR WITHOUT PELVIS) 3V RIGHT COMPARISON:  None Available. FINDINGS: Acute intertrochanteric right femur fracture with comminution and varus deformity. Both hips are located. There is pelvic bone osteopenia without acute fracture. Remote and nonunited left greater trochanter fracture with corticated margins. Degenerative spurring at both sacroiliac joints. IMPRESSION: 1. Acute intertrochanteric right femur fracture. 2. Chronic left greater trochanter femur fracture. Electronically Signed   By: Tiburcio Pea M.D.   On: 02/05/2023 06:23   DG Shoulder Right  Result Date: 02/05/2023 CLINICAL DATA:  Status post fall.  Complains of right shoulder pain. EXAM: RIGHT SHOULDER - 2+ VIEW COMPARISON:  None Available. FINDINGS: There is an acute, impacted, comminuted fracture deformity involving the surgical neck of the humerus. No signs of dislocation. IMPRESSION: Acute,  impacted, comminuted fracture deformity involving the surgical neck of the humerus. Electronically Signed   By: Signa Kell M.D.   On: 02/05/2023 06:22   CT Head Wo Contrast  Result Date: 02/05/2023 CLINICAL DATA:  Neck trauma.  Minor head trauma EXAM: CT HEAD WITHOUT CONTRAST CT CERVICAL SPINE WITHOUT CONTRAST TECHNIQUE: Multidetector CT imaging of the head and cervical spine was performed following the standard protocol without intravenous contrast. Multiplanar CT image reconstructions of the cervical spine were also generated. RADIATION DOSE REDUCTION: This exam was performed according to the departmental dose-optimization program which includes automated exposure control, adjustment of the mA and/or kV according to patient size and/or use of iterative reconstruction technique. COMPARISON:  01/26/2023 FINDINGS: CT HEAD FINDINGS Brain: No evidence of acute infarction, hemorrhage, hydrocephalus, extra-axial collection or mass lesion/mass effect. Generalized atrophy. Fairly extensive chronic small vessel ischemic low-density in the cerebral white matter and deep gray nuclei. Vascular: No hyperdense vessel or unexpected calcification. Skull: No acute finding. Small inner table osteoma at the left middle cranial fossa. Sinuses/Orbits: No evidence of injury CT CERVICAL SPINE FINDINGS Alignment: Normal. Skull base and vertebrae:  No acute fracture. No primary bone lesion or focal pathologic process. Soft tissues and spinal canal: No prevertebral fluid or swelling. No visible canal hematoma. Disc levels:  Ordinary cervical spine degeneration. Upper chest: Clear apical lungs IMPRESSION: No evidence of acute intracranial or cervical spine injury. Electronically Signed   By: Tiburcio Pea M.D.   On: 02/05/2023 06:09   CT Cervical Spine Wo Contrast  Result Date: 02/05/2023 CLINICAL DATA:  Neck trauma.  Minor head trauma EXAM: CT HEAD WITHOUT CONTRAST CT CERVICAL SPINE WITHOUT CONTRAST TECHNIQUE: Multidetector CT  imaging of the head and cervical spine was performed following the standard protocol without intravenous contrast. Multiplanar CT image reconstructions of the cervical spine were also generated. RADIATION DOSE REDUCTION: This exam was performed according to the departmental dose-optimization program which includes automated exposure control, adjustment of the mA and/or kV according to patient size and/or use of iterative reconstruction technique. COMPARISON:  01/26/2023 FINDINGS: CT HEAD FINDINGS Brain: No evidence of acute infarction, hemorrhage, hydrocephalus, extra-axial collection or mass lesion/mass effect. Generalized atrophy. Fairly extensive chronic small vessel ischemic low-density in the cerebral white matter and deep gray nuclei. Vascular: No hyperdense vessel or unexpected calcification. Skull: No acute finding. Small inner table osteoma at the left middle cranial fossa. Sinuses/Orbits: No evidence of injury CT CERVICAL SPINE FINDINGS Alignment: Normal. Skull base and vertebrae: No acute fracture. No primary bone lesion or focal pathologic process. Soft tissues and spinal canal: No prevertebral fluid or swelling. No visible canal hematoma. Disc levels:  Ordinary cervical spine degeneration. Upper chest: Clear apical lungs IMPRESSION: No evidence of acute intracranial or cervical spine injury. Electronically Signed   By: Tiburcio Pea M.D.   On: 02/05/2023 06:09    EKG: Independently reviewed. Afib with rate 112; prolonged QTc 503; no evidence of acute ischemia   Labs on Admission: I have personally reviewed the available labs and imaging studies at the time of the admission.  Pertinent labs:    VBG: 7.457/21.5/15.2 CO2 16 Glucose 323 BUN 27/Creatinine 1.26/GFR 40 Albumin 3.1 WBC 11.3 Hgb 8.8 INR 1.7   Assessment and Plan: Principal Problem:   Closed right hip fracture, initial encounter (HCC) Active Problems:   Type 2 diabetes mellitus with hyperglycemia, without long-term  current use of insulin (HCC)   Hypertension   Hypothyroidism   Cerebrovascular accident (CVA) (HCC)   Atrial fibrillation/flutter (HCC)   Fall at nursing home, initial encounter   Humeral surgical neck fracture   DNR (do not resuscitate)    R hip fracture -Apparently mechanical fall at SNF resulting in hip fracture -Orthopedics consulted -NPO after midnight in anticipation of surgical repair tomorrow -SCDs overnight, start Lovenox post-operatively (or as per ortho) -Pain control with Tylenol, Robaxin, Oxycodone, and Morphine prn -TOC team consult for rehab placement -Will need PT consult post-operatively -Hip fracture order set utilized -TXA per orthopedics -Fascia iliacus block ordered per anesthesia  R humerus fracture -Also with R surgical neck humerus fracture -Orthopedics is also consulting for this -Current plan per ortho is non-operative management -She will need a sling and NWB -This will complicate her rehab  Pre-operative stratification -Orthopedic/spinal surgery is associated with an intermediate (1-5%) cardiovascular risk for cardiac death and nonfatal MI -With her h/o CVA, her revised cardiac index gives a risk estimate of 6% -Because of this risk, she is recommended to have pre-operative EKG testing prior to surgery; this was done in the ER -Her Detsky's Modified Cardiac Risk Index score is low cardiac risk -It is reasonable for  her to go to the OR without additional evaluation  AKI -Mild worsening compared to prior, appears to have stage 3a CKD -Attempt to avoid nephrotoxic medications -Recheck BMP in AM   Prolonged QTc  -Will attempt to avoid QT-prolonging medications such as PPI, nausea meds, SSRIs -Repeat EKG in AM   Recent CVA -Hospitalized earlier this month for CVA -Generalized weakness resulting without focal deficit -She does have new-onset afib and so is at increased risk of stroke recurrence without AC -Consider early resumption of Eliquis  post-operatively but long-term risk:benefit needs to be considered given fall risk  Afib -Recent diagnosis -Rate controlled with Toprol XL -Hold Eliquis, consider dc given apparent fall risk  HTN -Continue amlodipine, Toprol XL -Hold olmesartan  DM -Will check A1c -hold Glucophage -Cover with moderate-scale SSI   Hypothyroidism -Continue Synthroid  DNR -I have discussed code status with the patient and her daughter and  they are in agreement that the patient would not desire resuscitation and would prefer to die a natural death should that situation arise. -She will need a gold out of facility DNR form at the time of discharge     Advance Care Planning:   Code Status: DNR   Consults: Orthopedics; SW, Nutrition; will need PT post-operatively   DVT Prophylaxis: SCDs until approved for Lovenox by orthopedics  Family Communication: Daughter was present throughout evaluation  Severity of Illness: The appropriate patient status for this patient is INPATIENT. Inpatient status is judged to be reasonable and necessary in order to provide the required intensity of service to ensure the patient's safety. The patient's presenting symptoms, physical exam findings, and initial radiographic and laboratory data in the context of their chronic comorbidities is felt to place them at high risk for further clinical deterioration. Furthermore, it is not anticipated that the patient will be medically stable for discharge from the hospital within 2 midnights of admission.   * I certify that at the point of admission it is my clinical judgment that the patient will require inpatient hospital care spanning beyond 2 midnights from the point of admission due to high intensity of service, high risk for further deterioration and high frequency of surveillance required.*  Author: Jonah Blue, MD 02/05/2023 6:06 PM  For on call review [email or web address].

## 2023-02-05 NOTE — Anesthesia Procedure Notes (Signed)
Anesthesia Regional Block: Femoral nerve block   Pre-Anesthetic Checklist: , timeout performed,  Correct Patient, Correct Site, Correct Laterality,  Correct Procedure, Correct Position, site marked,  Risks and benefits discussed,  Surgical consent,  Pre-op evaluation,  At surgeon's request and post-op pain management  Laterality: Right  Prep: chloraprep       Needles:  Injection technique: Single-shot  Needle Type: Echogenic Needle     Needle Length: 9cm  Needle Gauge: 21     Additional Needles:   Procedures:,,,, ultrasound used (permanent image in chart),,    Narrative:  Start time: 02/05/2023 4:34 PM End time: 02/05/2023 4:28 PM Injection made incrementally with aspirations every 5 mL.  Performed by: Personally  Anesthesiologist: Collene Schlichter, MD  Additional Notes: No pain on injection. No increased resistance to injection. Injection made in 5cc increments.  Good needle visualization.  Patient tolerated procedure well.

## 2023-02-05 NOTE — Progress Notes (Signed)
   02/05/23 0510  Spiritual Encounters  Type of Visit Initial  Care provided to: Pt not available  Referral source Trauma page  Reason for visit Trauma  OnCall Visit No   Chaplain responded to a level two trauma. The patient, Barbara Butler was attended to by the medical team. No family is present. If a chaplain is requested someone will respond.   Valerie Roys [facility]  [phone]

## 2023-02-05 NOTE — ED Notes (Signed)
ED TO INPATIENT HANDOFF REPORT  ED Nurse Name and Phone #:  [phone]     S Name/Age/Gender Barbara Butler 89 y.o. female Room/Bed: 046C/046C  Code Status   Code Status: DNR  Home/SNF/Other Home Patient oriented to: self and place Is this baseline? Yes   Triage Complete: Triage complete  Chief Complaint Fall at nursing home, initial encounter [W19.Lorne Skeens, Y92.129]  Triage Note No notes on file   Allergies No Known Allergies  Level of Care/Admitting Diagnosis ED Disposition     ED Disposition  Admit   Condition  --   Comment  Hospital Area: [facility] [[id number]]  Level of Care: Telemetry Medical [[id number]]  May admit patient to [facility] or [facility] if equivalent level of care is available:: No  Covid Evaluation: Asymptomatic - no recent exposure (last 10 days) testing not required  Diagnosis: Fall at nursing home, initial encounter [phone]  Admitting Physician: Jonah Blue [[id number]]  Attending Physician: Jonah Blue [[id number]]  Certification:: I certify this patient will need inpatient services for at least 2 midnights  Estimated Length of Stay: 5          B Medical/Surgery History Past Medical History:  Diagnosis Date   Anemia    Diabetes mellitus without complication (HCC)    diet controlled   Diabetic neuropathy (HCC)    Dysrhythmia    Hypertension    Hypothyroidism    Past Surgical History:  Procedure Laterality Date   CATARACT EXTRACTION W/PHACO Right 01/28/2019   Procedure: CATARACT EXTRACTION PHACO AND INTRAOCULAR LENS PLACEMENT (IOC) RIGHT DIABETES;  Surgeon: Elliot Cousin, MD;  Location: [facility] ORS;  Service: Ophthalmology;  Laterality: Right;  [place] 00:49.3 CDE 8.12 Fluid Pack Lot # [id number]   CATARACT EXTRACTION W/PHACO Left 02/25/2019   Procedure: CATARACT EXTRACTION PHACO AND INTRAOCULAR LENS PLACEMENT (IOC), LEFT, DIABETIC, VISION BLUE;  Surgeon: Elliot Cousin, MD;  Location: [facility] ORS;  Service: Ophthalmology;  Laterality:  Left;  [place]  01:10 CDE 11.43 Fluid pack lot # [id number]   FRACTURE SURGERY     ORIF WRIST FRACTURE Right 06/28/2016   Procedure: OPEN REDUCTION INTERNAL FIXATION (ORIF) WRIST FRACTURE;  Surgeon: Kennedy Bucker, MD;  Location: [facility] ORS;  Service: Orthopedics;  Laterality: Right;   SALPINGECTOMY       A IV Location/Drains/Wounds Patient Lines/Drains/Airways Status     Active Line/Drains/Airways     Name Placement date Placement time Site Days   Peripheral IV 02/05/23 20 G Anterior;Left Forearm 02/05/23  0529  Forearm  less than 1            Intake/Output Last 24 hours No intake or output data in the 24 hours ending 02/05/23 1518  Labs/Imaging Results for orders placed or performed during the hospital encounter of 02/05/23 (from the past 48 hour(s))  Type and screen [facility]     Status: None   Collection Time: 02/05/23  5:22 AM  Result Value Ref Range   ABO/RH(D) O POS    Antibody Screen NEG    Sample Expiration      02/08/2023,2359 Performed at [facility], [address] [place], [place] [address]   I-stat chem 8, ed     Status: Abnormal   Collection Time: 02/05/23  5:31 AM  Result Value Ref Range   Sodium 136 135 - 145 mmol/L   Potassium 4.2 3.5 - 5.1 mmol/L   Chloride 103 98 - 111 mmol/L   BUN 26 (H) 8 - 23 mg/dL  Creatinine, Ser 1.00 0.44 - 1.00 mg/dL   Glucose, Bld 161 (H) 70 - 99 mg/dL    Comment: Glucose reference range applies only to samples taken after fasting for at least 8 hours.   Calcium, Ion 1.10 (L) 1.15 - 1.40 mmol/L   TCO2 16 (L) 22 - 32 mmol/L   Hemoglobin 8.8 (L) 12.0 - 15.0 g/dL   HCT 09.6 (L) 04.5 - 40.9 %  CBC with Differential     Status: Abnormal   Collection Time: 02/05/23  5:32 AM  Result Value Ref Range   WBC 11.3 (H) 4.0 - 10.5 K/uL   RBC 3.12 (L) 3.87 - 5.11 MIL/uL   Hemoglobin 8.8 (L) 12.0 - 15.0 g/dL   HCT 81.1 (L) 91.4 - 78.2 %   MCV 88.1 80.0 - 100.0 fL   MCH 28.2 26.0 - 34.0 pg   MCHC 32.0 30.0 -  36.0 g/dL   RDW 95.6 21.3 - 08.6 %   Platelets 215 150 - 400 K/uL   nRBC 0.0 0.0 - 0.2 %   Neutrophils Relative % 88 %   Neutro Abs 10.0 (H) 1.7 - 7.7 K/uL   Lymphocytes Relative 7 %   Lymphs Abs 0.8 0.7 - 4.0 K/uL   Monocytes Relative 4 %   Monocytes Absolute 0.4 0.1 - 1.0 K/uL   Eosinophils Relative 0 %   Eosinophils Absolute 0.0 0.0 - 0.5 K/uL   Basophils Relative 0 %   Basophils Absolute 0.0 0.0 - 0.1 K/uL   Immature Granulocytes 1 %   Abs Immature Granulocytes 0.09 (H) 0.00 - 0.07 K/uL    Comment: Performed at [facility], [address], [place], [place] [address]  Comprehensive metabolic panel     Status: Abnormal   Collection Time: 02/05/23  5:32 AM  Result Value Ref Range   Sodium 136 135 - 145 mmol/L   Potassium 4.2 3.5 - 5.1 mmol/L   Chloride 102 98 - 111 mmol/L   CO2 16 (L) 22 - 32 mmol/L   Glucose, Bld 323 (H) 70 - 99 mg/dL    Comment: Glucose reference range applies only to samples taken after fasting for at least 8 hours.   BUN 27 (H) 8 - 23 mg/dL   Creatinine, Ser 9.62 (H) 0.44 - 1.00 mg/dL   Calcium 8.9 8.9 - 95.2 mg/dL   Total Protein 5.4 (L) 6.5 - 8.1 g/dL   Albumin 3.1 (L) 3.5 - 5.0 g/dL   AST 36 15 - 41 U/L   ALT 31 0 - 44 U/L   Alkaline Phosphatase 43 38 - 126 U/L   Total Bilirubin 0.8 0.3 - 1.2 mg/dL   GFR, Estimated 40 (L) >60 mL/min    Comment: (NOTE) Calculated using the CKD-EPI Creatinine Equation (2021)    Anion gap 18 (H) 5 - 15    Comment: Performed at [facility], [address] [place], [place] [address]  Protime-INR     Status: Abnormal   Collection Time: 02/05/23  5:32 AM  Result Value Ref Range   Prothrombin Time 19.7 (H) 11.4 - 15.2 seconds   INR 1.7 (H) 0.8 - 1.2    Comment: (NOTE) INR goal varies based on device and disease states. Performed at [facility], [address] [place], [place] [address]   ABO/Rh     Status: None   Collection Time: 02/05/23  5:34 AM  Result Value Ref Range   ABO/RH(D)  O  POS Performed at [facility], [address], [place], [place] [address]   I-Stat venous blood gas, ED     Status: Abnormal   Collection Time: 02/05/23  5:43 AM  Result Value Ref Range   pH, Ven 7.457 (H) 7.25 - 7.43   pCO2, Ven 21.5 (L) 44 - 60 mmHg   pO2, Ven 48 (H) 32 - 45 mmHg   Bicarbonate 15.2 (L) 20.0 - 28.0 mmol/L   TCO2 16 (L) 22 - 32 mmol/L   O2 Saturation 87 %   Acid-base deficit 7.0 (H) 0.0 - 2.0 mmol/L   Sodium 136 135 - 145 mmol/L   Potassium 4.3 3.5 - 5.1 mmol/L   Calcium, Ion 1.10 (L) 1.15 - 1.40 mmol/L   HCT 26.0 (L) 36.0 - 46.0 %   Hemoglobin 8.8 (L) 12.0 - 15.0 g/dL   Sample type VENOUS   CBG monitoring, ED     Status: Abnormal   Collection Time: 02/05/23 12:07 PM  Result Value Ref Range   Glucose-Capillary 318 (H) 70 - 99 mg/dL    Comment: Glucose reference range applies only to samples taken after fasting for at least 8 hours.   Comment 1 Notify RN    Comment 2 Document in Chart    CT CHEST ABDOMEN PELVIS W CONTRAST  Result Date: 02/05/2023 CLINICAL DATA:  One poly trauma. EXAM: CT CHEST, ABDOMEN, AND PELVIS WITH CONTRAST TECHNIQUE: Multidetector CT imaging of the chest, abdomen and pelvis was performed following the standard protocol during bolus administration of intravenous contrast. RADIATION DOSE REDUCTION: This exam was performed according to the departmental dose-optimization program which includes automated exposure control, adjustment of the mA and/or kV according to patient size and/or use of iterative reconstruction technique. CONTRAST:  75mL OMNIPAQUE IOHEXOL 350 MG/ML SOLN COMPARISON:  None Available. FINDINGS: CT CHEST FINDINGS Cardiovascular: Biatrial enlargement. No pericardial effusion. No evidence of great vessel injury. Extensive atheromatous plaque affecting the aorta and great vessels. Mediastinum/Nodes: No hematoma or pneumomediastinum Lungs/Pleura: No hemothorax, pneumothorax, or pulmonary contusion. Mild subpleural  reticulation/scarring. Musculoskeletal: Impacted right humeral neck fracture also involving the lesser tuberosity. There is a lucency at the posterior glenoid on the right which appears corticated and chronic. Right glenohumeral osteoarthritis. Hemorrhage/edema involves the right upper extremity soft tissues around the fracture. CT ABDOMEN PELVIS FINDINGS Hepatobiliary: No hepatic injury or perihepatic hematoma. Elongated calcified stone in the gallbladder. Thickening with some cystic change at the fundus of the gallbladder, usually adenomyomatosis. Liver cyst measuring up to 4.2 cm in the left lobe. Pancreas: No acute finding. Diffuse dilatation of the main duct measuring up to 6 mm, new from 2013. No underlying mass or cyst is seen. Generalized pancreatic atrophy. Spleen: No splenic injury or perisplenic hematoma. Adrenals/Urinary Tract: No adrenal hemorrhage or renal injury identified. There is a wedge-shaped area of hypoenhancing cortex at the upper pole left kidney measuring up to 3 cm on the delayed phase, without distortion of the capsule. Renal cysts on both sides measuring up to 4 cm. Bladder is unremarkable. Stomach/Bowel: No evidence of injury. Innumerable distal colonic diverticula. Vascular/Lymphatic: Atheromatous plaque.  No acute vascular finding. Reproductive: Unremarkable for age Other: No ascites or pneumoperitoneum. Fatty midline hernia at the umbilicus. Musculoskeletal: Acute intertrochanteric right femur fracture with extensive swelling in the upper right thigh. 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 Chronic endplate/compression fractures affecting L1, L3, and L5. IMPRESSION: 1. Acute fractures of the proximal right humeral neck and intertrochanteric right femur. 2. No intrathoracic  or intra-abdominal injury is seen. 3. Diffuse pancreatic ductal dilatation since 2013. No visible underlying mass, consider pancreas MRI follow-up if appropriate for comorbidities. 4. Wedge-shaped  area heterogeneous/hypoenhancement in the left upper pole kidney, morphology favoring infarct over infection or lesion. Attention at any follow-up. 5. Numerous chronic findings are described above. Electronically Signed   By: Tiburcio Pea M.D.   On: 02/05/2023 06:40   DG Chest Port 1 View  Result Date: 02/05/2023 CLINICAL DATA:  Fall with femur fracture. EXAM: PORTABLE CHEST 1 VIEW COMPARISON:  01/26/2023 FINDINGS: Right humeral neck fracture, there is pending shoulder radiograph. Normal heart size and mediastinal contours. Mitral annular calcification. There is no edema, consolidation, effusion, or pneumothorax. Artifact from EKG leads. IMPRESSION: 1. No evidence of acute cardiopulmonary disease. 2. Right proximal humerus fracture. Electronically Signed   By: Tiburcio Pea M.D.   On: 02/05/2023 06:24   DG Hip Unilat W or Wo Pelvis 2-3 Views Right  Result Date: 02/05/2023 CLINICAL DATA:  Fall with right hip pain EXAM: DG HIP (WITH OR WITHOUT PELVIS) 3V RIGHT COMPARISON:  None Available. FINDINGS: Acute intertrochanteric right femur fracture with comminution and varus deformity. Both hips are located. There is pelvic bone osteopenia without acute fracture. Remote and nonunited left greater trochanter fracture with corticated margins. Degenerative spurring at both sacroiliac joints. IMPRESSION: 1. Acute intertrochanteric right femur fracture. 2. Chronic left greater trochanter femur fracture. Electronically Signed   By: Tiburcio Pea M.D.   On: 02/05/2023 06:23   DG Shoulder Right  Result Date: 02/05/2023 CLINICAL DATA:  Status post fall.  Complains of right shoulder pain. EXAM: RIGHT SHOULDER - 2+ VIEW COMPARISON:  None Available. FINDINGS: There is an acute, impacted, comminuted fracture deformity involving the surgical neck of the humerus. No signs of dislocation. IMPRESSION: Acute, impacted, comminuted fracture deformity involving the surgical neck of the humerus. Electronically Signed   By:  Signa Kell M.D.   On: 02/05/2023 06:22   CT Head Wo Contrast  Result Date: 02/05/2023 CLINICAL DATA:  Neck trauma.  Minor head trauma EXAM: CT HEAD WITHOUT CONTRAST CT CERVICAL SPINE WITHOUT CONTRAST TECHNIQUE: Multidetector CT imaging of the head and cervical spine was performed following the standard protocol without intravenous contrast. Multiplanar CT image reconstructions of the cervical spine were also generated. RADIATION DOSE REDUCTION: This exam was performed according to the departmental dose-optimization program which includes automated exposure control, adjustment of the mA and/or kV according to patient size and/or use of iterative reconstruction technique. COMPARISON:  01/26/2023 FINDINGS: CT HEAD FINDINGS Brain: No evidence of acute infarction, hemorrhage, hydrocephalus, extra-axial collection or mass lesion/mass effect. Generalized atrophy. Fairly extensive chronic small vessel ischemic low-density in the cerebral white matter and deep gray nuclei. Vascular: No hyperdense vessel or unexpected calcification. Skull: No acute finding. Small inner table osteoma at the left middle cranial fossa. Sinuses/Orbits: No evidence of injury CT CERVICAL SPINE FINDINGS Alignment: Normal. Skull base and vertebrae: No acute fracture. No primary bone lesion or focal pathologic process. Soft tissues and spinal canal: No prevertebral fluid or swelling. No visible canal hematoma. Disc levels:  Ordinary cervical spine degeneration. Upper chest: Clear apical lungs IMPRESSION: No evidence of acute intracranial or cervical spine injury. Electronically Signed   By: Tiburcio Pea M.D.   On: 02/05/2023 06:09   CT Cervical Spine Wo Contrast  Result Date: 02/05/2023 CLINICAL DATA:  Neck trauma.  Minor head trauma EXAM: CT HEAD WITHOUT CONTRAST CT CERVICAL SPINE WITHOUT CONTRAST TECHNIQUE: Multidetector CT imaging of the head  and cervical spine was performed following the standard protocol without intravenous  contrast. Multiplanar CT image reconstructions of the cervical spine were also generated. RADIATION DOSE REDUCTION: This exam was performed according to the departmental dose-optimization program which includes automated exposure control, adjustment of the mA and/or kV according to patient size and/or use of iterative reconstruction technique. COMPARISON:  01/26/2023 FINDINGS: CT HEAD FINDINGS Brain: No evidence of acute infarction, hemorrhage, hydrocephalus, extra-axial collection or mass lesion/mass effect. Generalized atrophy. Fairly extensive chronic small vessel ischemic low-density in the cerebral white matter and deep gray nuclei. Vascular: No hyperdense vessel or unexpected calcification. Skull: No acute finding. Small inner table osteoma at the left middle cranial fossa. Sinuses/Orbits: No evidence of injury CT CERVICAL SPINE FINDINGS Alignment: Normal. Skull base and vertebrae: No acute fracture. No primary bone lesion or focal pathologic process. Soft tissues and spinal canal: No prevertebral fluid or swelling. No visible canal hematoma. Disc levels:  Ordinary cervical spine degeneration. Upper chest: Clear apical lungs IMPRESSION: No evidence of acute intracranial or cervical spine injury. Electronically Signed   By: Tiburcio Pea M.D.   On: 02/05/2023 06:09    Pending Labs Unresulted Labs (From admission, onward)     Start     Ordered   02/06/23 0500  CBC  Tomorrow morning,   R        02/05/23 1002   02/06/23 0500  Basic metabolic panel  Tomorrow morning,   R        02/05/23 1002   02/05/23 1500  Basic metabolic panel  Once,   R        02/05/23 1338            Vitals/Pain Today's Vitals   02/05/23 1415 02/05/23 1430 02/05/23 1445 02/05/23 1500  BP: 114/69 109/67 121/76 102/68  Pulse: (!) 107 (!) 106 (!) 112 (!) 124  Resp: (!) 25 14 17  (!) 22  Temp:      TempSrc:      SpO2: 100% 100% 99% 100%  Weight:      Height:      PainSc:        Isolation Precautions No active  isolations  Medications Medications  metoprolol succinate (TOPROL-XL) 24 hr tablet 25 mg (25 mg Oral Given 02/05/23 1256)  amLODipine (NORVASC) tablet 5 mg (5 mg Oral Given 02/05/23 1256)  hydrALAZINE (APRESOLINE) tablet 50 mg (50 mg Oral Given 02/05/23 1256)  levothyroxine (SYNTHROID) tablet 88 mcg (88 mcg Oral Given 02/05/23 1256)  morphine (PF) 2 MG/ML injection 2 mg (has no administration in time range)  methocarbamol (ROBAXIN) tablet 500 mg (has no administration in time range)    Or  methocarbamol (ROBAXIN) 500 mg in dextrose 5 % 50 mL IVPB (has no administration in time range)  docusate sodium (COLACE) capsule 100 mg (100 mg Oral Given 02/05/23 1256)  polyethylene glycol (MIRALAX / GLYCOLAX) packet 17 g (has no administration in time range)  bisacodyl (DULCOLAX) EC tablet 5 mg (has no administration in time range)  oxyCODONE (Oxy IR/ROXICODONE) immediate release tablet 5-10 mg (has no administration in time range)  insulin aspart (novoLOG) injection 0-9 Units (7 Units Subcutaneous Given 02/05/23 1257)  iohexol (OMNIPAQUE) 350 MG/ML injection 75 mL (75 mLs Intravenous Contrast Given 02/05/23 0605)  sodium chloride 0.9 % bolus 500 mL (0 mLs Intravenous Stopped 02/05/23 0949)    Mobility non-ambulatory     Focused Assessments musculoskeletal   R Recommendations: See Admitting Provider Note  Report given to:   Additional  Notes:

## 2023-02-05 NOTE — Progress Notes (Signed)
Orthopedic Tech Progress Note Patient Details:  Barbara Butler 1930/02/16 [id number]  Ortho Devices Type of Ortho Device: Shoulder immobilizer Ortho Device/Splint Location: RUE Ortho Device/Splint Interventions: Ordered  Pt had to go to CT when I came to apply sling.    Al Decant 02/05/2023, 5:49 AM

## 2023-02-05 NOTE — Inpatient Diabetes Management (Signed)
Inpatient Diabetes Program Recommendations  AACE/ADA: New Consensus Statement on Inpatient Glycemic Control   Target Ranges:  Prepandial:   less than 140 mg/dL      Peak postprandial:   less than 180 mg/dL (1-2 hours)      Critically ill patients:  140 - 180 mg/dL    Latest Reference Range & Units 02/05/23 12:07  Glucose-Capillary 70 - 99 mg/dL 151 (H)    Latest Reference Range & Units 02/05/23 05:32  CO2 22 - 32 mmol/L 16 (L)  Glucose 70 - 99 mg/dL 761 (H)  BUN 8 - 23 mg/dL 27 (H)  Creatinine 6.07 - 1.00 mg/dL 3.71 (H)  Calcium 8.9 - 10.3 mg/dL 8.9  Anion gap 5 - 15  18 (H)   Review of Glycemic Control  Diabetes history: DM2 Outpatient Diabetes medications: Metformin 500 mg BID Current orders for Inpatient glycemic control: Novolog 0-9 units TID with meals  Inpatient Diabetes Program Recommendations:    Insulin: Labs today at 5:32 am indicative of acidosis. May need to consider using IV insulin if in DKA.   NOTE: Patient is currently in ED after fall with hip fracture. Noted labs this morning at 5:32 am indicative of acidosis with glucose of 323 mg/dl. Sent Dr. Ophelia Charter chat message to be sure acidosis was noted from labs this morning.   Thanks, Orlando Penner, RN, MSN, CDCES Diabetes Coordinator Inpatient Diabetes Program [phone] (Team Pager from 8am to 5pm)

## 2023-02-05 NOTE — ED Provider Notes (Signed)
[facility] EMERGENCY DEPARTMENT AT [facility] Provider Note   CSN: [id number] Arrival date & time: 02/05/23  2952     History  Chief Complaint  Patient presents with   Fall    Patient to ED via EMS with complaint of mechanical fall around 1900 last night. Patient takes eliquis. Patient unable to ambulate after fall. Patient independent ADL at baseline. EMS reports patient states right shoulder pain and right hip pain. EMS reports right leg shortening and rotation.    Barbara Butler is a 89 y.o. female.  The history is provided by the patient, medical records and the EMS personnel.  Fall  Barbara Butler is a 89 y.o. female who presents to the Emergency Department complaining of fall.  She presents emergency department as a level 2 trauma following fall on thinners.  She is currently rehabilitating at [facility].  At 7 PM she had a mechanical fall and landed on her right side.  It is unclear if she hit her head.  She complains of pain to her right shoulder and hip.  She was brought in this morning due to being unable to walk since the fall.  She denies any recent illnesses.  EMS reports blood sugar in the 400s.  She does have some neck discomfort.  No additional symptoms.  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  Takes Eliquis.     Home Medications Prior to Admission medications   Medication Sig Start Date End Date Taking? Authorizing Provider  amLODipine (NORVASC) 5 MG tablet Take 5 mg by mouth daily.    [provider]  apixaban (ELIQUIS) 2.5 MG TABS tablet Take 1 tablet (2.5 mg total) by mouth 2 (two) times daily. 01/27/23   Arnetha Courser, MD  Ascorbic Acid (VITAMIN C) 1000 MG tablet Take 1,000 mg by mouth daily at 12 noon.    [provider]  aspirin EC 81 MG tablet Take 81 mg by mouth daily at 12 noon.    [provider]  cholecalciferol (VITAMIN D3) 25 MCG (1000 UT) tablet Take 1,000 Units by mouth daily at 12 noon.     [provider]  hydrALAZINE (APRESOLINE) 50 MG tablet Take 50 mg by mouth 2 (two) times a day.    [provider]  levothyroxine (SYNTHROID, LEVOTHROID) 88 MCG tablet Take 88 mcg by mouth daily.  12/05/15   [provider]  MEGARED OMEGA-3 KRILL OIL PO Take 750 mg by mouth daily at 12 noon.    [provider]  metFORMIN (GLUCOPHAGE) 500 MG tablet Take 500 mg by mouth 2 (two) times daily.    [provider]  metoprolol succinate (TOPROL-XL) 25 MG 24 hr tablet Take 1 tablet by mouth daily. 12/04/22 12/04/23  [provider]  olmesartan (BENICAR) 40 MG tablet Take 40 mg by mouth daily.    [provider]      Allergies    Patient has no known allergies.    Review of Systems   Review of Systems  All other systems reviewed and are negative.   Physical Exam Updated Vital Signs BP 135/75   Pulse (!) 108   Temp 98 F (36.7 C)   Resp 14   Ht 5\' 2"  (1.575 m)   Wt 55 kg   SpO2 100%   BMI 22.18 kg/m  Physical Exam Vitals and nursing note reviewed.  Constitutional:      Appearance: She is well-developed.  HENT:  Head: Normocephalic and atraumatic.  Cardiovascular:     Rate and Rhythm: Tachycardia present. Rhythm irregular.     Heart sounds: No murmur heard. Pulmonary:     Effort: Pulmonary effort is normal. No respiratory distress.     Breath sounds: Normal breath sounds.  Abdominal:     Palpations: Abdomen is soft.     Tenderness: There is no abdominal tenderness. There is no guarding or rebound.  Musculoskeletal:     Comments: Ecchymosis and tenderness over the right shoulder and proximal upper chest wall.  There is soft tissue swelling and tenderness over the right hip. RLE externally rotated and shortened  Skin:    General: Skin is warm and dry.  Neurological:     Mental Status: She is alert and oriented to person, place, and time.  Psychiatric:        Behavior: Behavior normal.     ED Results / Procedures /  Treatments   Labs (all labs ordered are listed, but only abnormal results are displayed) Labs Reviewed  CBC WITH DIFFERENTIAL/PLATELET - Abnormal; Notable for the following components:      Result Value   WBC 11.3 (*)    RBC 3.12 (*)    Hemoglobin 8.8 (*)    HCT 27.5 (*)    Neutro Abs 10.0 (*)    Abs Immature Granulocytes 0.09 (*)    All other components within normal limits  COMPREHENSIVE METABOLIC PANEL - Abnormal; Notable for the following components:   CO2 16 (*)    Glucose, Bld 323 (*)    BUN 27 (*)    Creatinine, Ser 1.26 (*)    Total Protein 5.4 (*)    Albumin 3.1 (*)    GFR, Estimated 40 (*)    Anion gap 18 (*)    All other components within normal limits  PROTIME-INR - Abnormal; Notable for the following components:   Prothrombin Time 19.7 (*)    INR 1.7 (*)    All other components within normal limits  I-STAT CHEM 8, ED - Abnormal; Notable for the following components:   BUN 26 (*)    Glucose, Bld 323 (*)    Calcium, Ion 1.10 (*)    TCO2 16 (*)    Hemoglobin 8.8 (*)    HCT 26.0 (*)    All other components within normal limits  I-STAT VENOUS BLOOD GAS, ED - Abnormal; Notable for the following components:   pH, Ven 7.457 (*)    pCO2, Ven 21.5 (*)    pO2, Ven 48 (*)    Bicarbonate 15.2 (*)    TCO2 16 (*)    Acid-base deficit 7.0 (*)    Calcium, Ion 1.10 (*)    HCT 26.0 (*)    Hemoglobin 8.8 (*)    All other components within normal limits  URINALYSIS, ROUTINE W REFLEX MICROSCOPIC  TYPE AND SCREEN  ABO/RH    EKG None  Radiology CT CHEST ABDOMEN PELVIS W CONTRAST  Result Date: 02/05/2023 CLINICAL DATA:  One poly trauma. EXAM: CT CHEST, ABDOMEN, AND PELVIS WITH CONTRAST TECHNIQUE: Multidetector CT imaging of the chest, abdomen and pelvis was performed following the standard protocol during bolus administration of intravenous contrast. RADIATION DOSE REDUCTION: This exam was performed according to the departmental dose-optimization program which includes  automated exposure control, adjustment of the mA and/or kV according to patient size and/or use of iterative reconstruction technique. CONTRAST:  75mL OMNIPAQUE IOHEXOL 350 MG/ML SOLN COMPARISON:  None Available. FINDINGS: CT CHEST FINDINGS Cardiovascular:  Biatrial enlargement. No pericardial effusion. No evidence of great vessel injury. Extensive atheromatous plaque affecting the aorta and great vessels. Mediastinum/Nodes: No hematoma or pneumomediastinum Lungs/Pleura: No hemothorax, pneumothorax, or pulmonary contusion. Mild subpleural reticulation/scarring. Musculoskeletal: Impacted right humeral neck fracture also involving the lesser tuberosity. There is a lucency at the posterior glenoid on the right which appears corticated and chronic. Right glenohumeral osteoarthritis. Hemorrhage/edema involves the right upper extremity soft tissues around the fracture. CT ABDOMEN PELVIS FINDINGS Hepatobiliary: No hepatic injury or perihepatic hematoma. Elongated calcified stone in the gallbladder. Thickening with some cystic change at the fundus of the gallbladder, usually adenomyomatosis. Liver cyst measuring up to 4.2 cm in the left lobe. Pancreas: No acute finding. Diffuse dilatation of the main duct measuring up to 6 mm, new from 2013. No underlying mass or cyst is seen. Generalized pancreatic atrophy. Spleen: No splenic injury or perisplenic hematoma. Adrenals/Urinary Tract: No adrenal hemorrhage or renal injury identified. There is a wedge-shaped area of hypoenhancing cortex at the upper pole left kidney measuring up to 3 cm on the delayed phase, without distortion of the capsule. Renal cysts on both sides measuring up to 4 cm. Bladder is unremarkable. Stomach/Bowel: No evidence of injury. Innumerable distal colonic diverticula. Vascular/Lymphatic: Atheromatous plaque.  No acute vascular finding. Reproductive: Unremarkable for age Other: No ascites or pneumoperitoneum. Fatty midline hernia at the umbilicus.  Musculoskeletal: Acute intertrochanteric right femur fracture with extensive swelling in the upper right thigh. 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 Chronic endplate/compression fractures affecting L1, L3, and L5. IMPRESSION: 1. Acute fractures of the proximal right humeral neck and intertrochanteric right femur. 2. No intrathoracic or intra-abdominal injury is seen. 3. Diffuse pancreatic ductal dilatation since 2013. No visible underlying mass, consider pancreas MRI follow-up if appropriate for comorbidities. 4. Wedge-shaped area heterogeneous/hypoenhancement in the left upper pole kidney, morphology favoring infarct over infection or lesion. Attention at any follow-up. 5. Numerous chronic findings are described above. Electronically Signed   By: Tiburcio Pea M.D.   On: 02/05/2023 06:40   DG Chest Port 1 View  Result Date: 02/05/2023 CLINICAL DATA:  Fall with femur fracture. EXAM: PORTABLE CHEST 1 VIEW COMPARISON:  01/26/2023 FINDINGS: Right humeral neck fracture, there is pending shoulder radiograph. Normal heart size and mediastinal contours. Mitral annular calcification. There is no edema, consolidation, effusion, or pneumothorax. Artifact from EKG leads. IMPRESSION: 1. No evidence of acute cardiopulmonary disease. 2. Right proximal humerus fracture. Electronically Signed   By: Tiburcio Pea M.D.   On: 02/05/2023 06:24   DG Hip Unilat W or Wo Pelvis 2-3 Views Right  Result Date: 02/05/2023 CLINICAL DATA:  Fall with right hip pain EXAM: DG HIP (WITH OR WITHOUT PELVIS) 3V RIGHT COMPARISON:  None Available. FINDINGS: Acute intertrochanteric right femur fracture with comminution and varus deformity. Both hips are located. There is pelvic bone osteopenia without acute fracture. Remote and nonunited left greater trochanter fracture with corticated margins. Degenerative spurring at both sacroiliac joints. IMPRESSION: 1. Acute intertrochanteric right femur fracture. 2. Chronic left  greater trochanter femur fracture. Electronically Signed   By: Tiburcio Pea M.D.   On: 02/05/2023 06:23   DG Shoulder Right  Result Date: 02/05/2023 CLINICAL DATA:  Status post fall.  Complains of right shoulder pain. EXAM: RIGHT SHOULDER - 2+ VIEW COMPARISON:  None Available. FINDINGS: There is an acute, impacted, comminuted fracture deformity involving the surgical neck of the humerus. No signs of dislocation. IMPRESSION: Acute, impacted, comminuted fracture deformity involving the surgical neck of the humerus.  Electronically Signed   By: Signa Kell M.D.   On: 02/05/2023 06:22   CT Head Wo Contrast  Result Date: 02/05/2023 CLINICAL DATA:  Neck trauma.  Minor head trauma EXAM: CT HEAD WITHOUT CONTRAST CT CERVICAL SPINE WITHOUT CONTRAST TECHNIQUE: Multidetector CT imaging of the head and cervical spine was performed following the standard protocol without intravenous contrast. Multiplanar CT image reconstructions of the cervical spine were also generated. RADIATION DOSE REDUCTION: This exam was performed according to the departmental dose-optimization program which includes automated exposure control, adjustment of the mA and/or kV according to patient size and/or use of iterative reconstruction technique. COMPARISON:  01/26/2023 FINDINGS: CT HEAD FINDINGS Brain: No evidence of acute infarction, hemorrhage, hydrocephalus, extra-axial collection or mass lesion/mass effect. Generalized atrophy. Fairly extensive chronic small vessel ischemic low-density in the cerebral white matter and deep gray nuclei. Vascular: No hyperdense vessel or unexpected calcification. Skull: No acute finding. Small inner table osteoma at the left middle cranial fossa. Sinuses/Orbits: No evidence of injury CT CERVICAL SPINE FINDINGS Alignment: Normal. Skull base and vertebrae: No acute fracture. No primary bone lesion or focal pathologic process. Soft tissues and spinal canal: No prevertebral fluid or swelling. No visible canal  hematoma. Disc levels:  Ordinary cervical spine degeneration. Upper chest: Clear apical lungs IMPRESSION: No evidence of acute intracranial or cervical spine injury. Electronically Signed   By: Tiburcio Pea M.D.   On: 02/05/2023 06:09   CT Cervical Spine Wo Contrast  Result Date: 02/05/2023 CLINICAL DATA:  Neck trauma.  Minor head trauma EXAM: CT HEAD WITHOUT CONTRAST CT CERVICAL SPINE WITHOUT CONTRAST TECHNIQUE: Multidetector CT imaging of the head and cervical spine was performed following the standard protocol without intravenous contrast. Multiplanar CT image reconstructions of the cervical spine were also generated. RADIATION DOSE REDUCTION: This exam was performed according to the departmental dose-optimization program which includes automated exposure control, adjustment of the mA and/or kV according to patient size and/or use of iterative reconstruction technique. COMPARISON:  01/26/2023 FINDINGS: CT HEAD FINDINGS Brain: No evidence of acute infarction, hemorrhage, hydrocephalus, extra-axial collection or mass lesion/mass effect. Generalized atrophy. Fairly extensive chronic small vessel ischemic low-density in the cerebral white matter and deep gray nuclei. Vascular: No hyperdense vessel or unexpected calcification. Skull: No acute finding. Small inner table osteoma at the left middle cranial fossa. Sinuses/Orbits: No evidence of injury CT CERVICAL SPINE FINDINGS Alignment: Normal. Skull base and vertebrae: No acute fracture. No primary bone lesion or focal pathologic process. Soft tissues and spinal canal: No prevertebral fluid or swelling. No visible canal hematoma. Disc levels:  Ordinary cervical spine degeneration. Upper chest: Clear apical lungs IMPRESSION: No evidence of acute intracranial or cervical spine injury. Electronically Signed   By: Tiburcio Pea M.D.   On: 02/05/2023 06:09    Procedures Procedures   CRITICAL CARE Performed by: Tilden Fossa   Total critical care time: 45  minutes  Critical care time was exclusive of separately billable procedures and treating other patients.  Critical care was necessary to treat or prevent imminent or life-threatening deterioration.  Critical care was time spent personally by me on the following activities: development of treatment plan with patient and/or surrogate as well as nursing, discussions with consultants, evaluation of patient's response to treatment, examination of patient, obtaining history from patient or surrogate, ordering and performing treatments and interventions, ordering and review of laboratory studies, ordering and review of radiographic studies, pulse oximetry and re-evaluation of patient's condition.  Medications Ordered in ED Medications  fentaNYL (SUBLIMAZE)  injection 50 mcg (50 mcg Intravenous Given 02/05/23 0608)  iohexol (OMNIPAQUE) 350 MG/ML injection 75 mL (75 mLs Intravenous Contrast Given 02/05/23 0605)    ED Course/ Medical Decision Making/ A&P                                 Medical Decision Making Amount and/or Complexity of Data Reviewed Labs: ordered. Radiology: ordered.  Risk Prescription drug management. Decision regarding hospitalization.   Patient with recent diagnosis of A-fib on anticoagulation as well as CVA here for evaluation following a fall as a level 2 trauma.  She does have evidence of injury to her right shoulder, right hip.  Plain films are significant for acute impacted proximal humerus fracture as well as right intertrochanteric femur fracture.  She is in atrial fibrillation, rates in the low 100s.  Trauma images were obtained, which are negative for additional significant traumatic injury.  She is hyperglycemic, bicarb is low with elevated anion gap and hyperglycemia but pH is within normal limits.  Will treat with some IV fluid hydration.  She was treated with fentanyl for pain control.  Discussed with Dr. Susa Simmonds with orthopedics-recommends keeping the patient n.p.o.  for possible intervention today.  Hospitalist consulted for admission for ongoing treatment.        Final Clinical Impression(s) / ED Diagnoses Final diagnoses:  Closed right hip fracture, initial encounter [facility])  Other closed displaced fracture of proximal end of right humerus, initial encounter  Fall, initial encounter    Rx / DC Orders ED Discharge Orders     None         Tilden Fossa, MD 02/05/23 [phone]

## 2023-02-05 NOTE — Consult Note (Signed)
Reason for Consult:Right humerus and hip fxs Referring Physician: Jonah Blue Time called: 1096 Time at bedside: 0454   Barbara Butler is an 89 y.o. female.  HPI: [name] was standing at a counter preparing her supper when she lost her balance and fell backwards. She's not sure what caused her to fall. She had immediate right shoulder and hip pain. She managed to get up but could not ambulate and was brought to the ED. X-rays showed a proximal humerus and intertroch fx and orthopedic surgery was consulted. She lives at [facility].  Past Medical History:  Diagnosis Date   Anemia    Diabetes mellitus without complication (HCC)    diet controlled   Diabetic neuropathy (HCC)    Dysrhythmia    Hypertension    Hypothyroidism     Past Surgical History:  Procedure Laterality Date   CATARACT EXTRACTION W/PHACO Right 01/28/2019   Procedure: CATARACT EXTRACTION PHACO AND INTRAOCULAR LENS PLACEMENT (IOC) RIGHT DIABETES;  Surgeon: Elliot Cousin, MD;  Location: [facility] ORS;  Service: Ophthalmology;  Laterality: Right;  [place] 00:49.3 CDE 8.12 Fluid Pack Lot # [id number]   CATARACT EXTRACTION W/PHACO Left 02/25/2019   Procedure: CATARACT EXTRACTION PHACO AND INTRAOCULAR LENS PLACEMENT (IOC), LEFT, DIABETIC, VISION BLUE;  Surgeon: Elliot Cousin, MD;  Location: [facility] ORS;  Service: Ophthalmology;  Laterality: Left;  [place]  01:10 CDE 11.43 Fluid pack lot # [id number]   FRACTURE SURGERY     ORIF WRIST FRACTURE Right 06/28/2016   Procedure: OPEN REDUCTION INTERNAL FIXATION (ORIF) WRIST FRACTURE;  Surgeon: Kennedy Bucker, MD;  Location: [facility] ORS;  Service: Orthopedics;  Laterality: Right;   SALPINGECTOMY      Family History  Problem Relation Age of Onset   Breast cancer Neg Hx     Social History:  reports that she quit smoking about 64 years ago. She has never used smokeless tobacco. She reports that she does not currently use alcohol. She reports that she does not use drugs.  Allergies: No Known  Allergies  Medications: I have reviewed the patient's current medications.  Results for orders placed or performed during the hospital encounter of 02/05/23 (from the past 48 hour(s))  Type and screen [facility]     Status: None   Collection Time: 02/05/23  5:22 AM  Result Value Ref Range   ABO/RH(D) O POS    Antibody Screen NEG    Sample Expiration      02/08/2023,2359 Performed at [facility], [address], [place], [place] [address]   I-stat chem 8, ed     Status: Abnormal   Collection Time: 02/05/23  5:31 AM  Result Value Ref Range   Sodium 136 135 - 145 mmol/L   Potassium 4.2 3.5 - 5.1 mmol/L   Chloride 103 98 - 111 mmol/L   BUN 26 (H) 8 - 23 mg/dL   Creatinine, Ser 5.62 0.44 - 1.00 mg/dL   Glucose, Bld 130 (H) 70 - 99 mg/dL    Comment: Glucose reference range applies only to samples taken after fasting for at least 8 hours.   Calcium, Ion 1.10 (L) 1.15 - 1.40 mmol/L   TCO2 16 (L) 22 - 32 mmol/L   Hemoglobin 8.8 (L) 12.0 - 15.0 g/dL   HCT 86.5 (L) 78.4 - 69.6 %  CBC with Differential     Status: Abnormal   Collection Time: 02/05/23  5:32 AM  Result Value Ref Range   WBC 11.3 (H) 4.0 - 10.5 K/uL  RBC 3.12 (L) 3.87 - 5.11 MIL/uL   Hemoglobin 8.8 (L) 12.0 - 15.0 g/dL   HCT 16.1 (L) 09.6 - 04.5 %   MCV 88.1 80.0 - 100.0 fL   MCH 28.2 26.0 - 34.0 pg   MCHC 32.0 30.0 - 36.0 g/dL   RDW 40.9 81.1 - 91.4 %   Platelets 215 150 - 400 K/uL   nRBC 0.0 0.0 - 0.2 %   Neutrophils Relative % 88 %   Neutro Abs 10.0 (H) 1.7 - 7.7 K/uL   Lymphocytes Relative 7 %   Lymphs Abs 0.8 0.7 - 4.0 K/uL   Monocytes Relative 4 %   Monocytes Absolute 0.4 0.1 - 1.0 K/uL   Eosinophils Relative 0 %   Eosinophils Absolute 0.0 0.0 - 0.5 K/uL   Basophils Relative 0 %   Basophils Absolute 0.0 0.0 - 0.1 K/uL   Immature Granulocytes 1 %   Abs Immature Granulocytes 0.09 (H) 0.00 - 0.07 K/uL    Comment: Performed at [facility], [address], [place], [place]  [address]  Comprehensive metabolic panel     Status: Abnormal   Collection Time: 02/05/23  5:32 AM  Result Value Ref Range   Sodium 136 135 - 145 mmol/L   Potassium 4.2 3.5 - 5.1 mmol/L   Chloride 102 98 - 111 mmol/L   CO2 16 (L) 22 - 32 mmol/L   Glucose, Bld 323 (H) 70 - 99 mg/dL    Comment: Glucose reference range applies only to samples taken after fasting for at least 8 hours.   BUN 27 (H) 8 - 23 mg/dL   Creatinine, Ser 6.21 (H) 0.44 - 1.00 mg/dL   Calcium 8.9 8.9 - 30.8 mg/dL   Total Protein 5.4 (L) 6.5 - 8.1 g/dL   Albumin 3.1 (L) 3.5 - 5.0 g/dL   AST 36 15 - 41 U/L   ALT 31 0 - 44 U/L   Alkaline Phosphatase 43 38 - 126 U/L   Total Bilirubin 0.8 0.3 - 1.2 mg/dL   GFR, Estimated 40 (L) >60 mL/min    Comment: (NOTE) Calculated using the CKD-EPI Creatinine Equation (2021)    Anion gap 18 (H) 5 - 15    Comment: Performed at [facility], [address], [place], [place] [address]  Protime-INR     Status: Abnormal   Collection Time: 02/05/23  5:32 AM  Result Value Ref Range   Prothrombin Time 19.7 (H) 11.4 - 15.2 seconds   INR 1.7 (H) 0.8 - 1.2    Comment: (NOTE) INR goal varies based on device and disease states. Performed at [facility], [address] [place], [place] [address]   ABO/Rh     Status: None   Collection Time: 02/05/23  5:34 AM  Result Value Ref Range   ABO/RH(D)      O POS Performed at [facility], [address] [place], [place] [address]   I-Stat venous blood gas, ED     Status: Abnormal   Collection Time: 02/05/23  5:43 AM  Result Value Ref Range   pH, Ven 7.457 (H) 7.25 - 7.43   pCO2, Ven 21.5 (L) 44 - 60 mmHg   pO2, Ven 48 (H) 32 - 45 mmHg   Bicarbonate 15.2 (L) 20.0 - 28.0 mmol/L   TCO2 16 (L) 22 - 32 mmol/L   O2 Saturation 87 %   Acid-base deficit 7.0 (H) 0.0 - 2.0 mmol/L   Sodium  136 135 - 145 mmol/L   Potassium 4.3 3.5 - 5.1 mmol/L   Calcium, Ion 1.10 (L) 1.15 - 1.40 mmol/L   HCT 26.0 (L) 36.0 - 46.0 %   Hemoglobin  8.8 (L) 12.0 - 15.0 g/dL   Sample type VENOUS     CT CHEST ABDOMEN PELVIS W CONTRAST  Result Date: 02/05/2023 CLINICAL DATA:  One poly trauma. EXAM: CT CHEST, ABDOMEN, AND PELVIS WITH CONTRAST TECHNIQUE: Multidetector CT imaging of the chest, abdomen and pelvis was performed following the standard protocol during bolus administration of intravenous contrast. RADIATION DOSE REDUCTION: This exam was performed according to the departmental dose-optimization program which includes automated exposure control, adjustment of the mA and/or kV according to patient size and/or use of iterative reconstruction technique. CONTRAST:  75mL OMNIPAQUE IOHEXOL 350 MG/ML SOLN COMPARISON:  None Available. FINDINGS: CT CHEST FINDINGS Cardiovascular: Biatrial enlargement. No pericardial effusion. No evidence of great vessel injury. Extensive atheromatous plaque affecting the aorta and great vessels. Mediastinum/Nodes: No hematoma or pneumomediastinum Lungs/Pleura: No hemothorax, pneumothorax, or pulmonary contusion. Mild subpleural reticulation/scarring. Musculoskeletal: Impacted right humeral neck fracture also involving the lesser tuberosity. There is a lucency at the posterior glenoid on the right which appears corticated and chronic. Right glenohumeral osteoarthritis. Hemorrhage/edema involves the right upper extremity soft tissues around the fracture. CT ABDOMEN PELVIS FINDINGS Hepatobiliary: No hepatic injury or perihepatic hematoma. Elongated calcified stone in the gallbladder. Thickening with some cystic change at the fundus of the gallbladder, usually adenomyomatosis. Liver cyst measuring up to 4.2 cm in the left lobe. Pancreas: No acute finding. Diffuse dilatation of the main duct measuring up to 6 mm, new from 2013. No underlying mass or cyst is seen. Generalized pancreatic atrophy. Spleen: No splenic injury or perisplenic hematoma. Adrenals/Urinary Tract: No adrenal hemorrhage or renal injury identified. There is a  wedge-shaped area of hypoenhancing cortex at the upper pole left kidney measuring up to 3 cm on the delayed phase, without distortion of the capsule. Renal cysts on both sides measuring up to 4 cm. Bladder is unremarkable. Stomach/Bowel: No evidence of injury. Innumerable distal colonic diverticula. Vascular/Lymphatic: Atheromatous plaque.  No acute vascular finding. Reproductive: Unremarkable for age Other: No ascites or pneumoperitoneum. Fatty midline hernia at the umbilicus. Musculoskeletal: Acute intertrochanteric right femur fracture with extensive swelling in the upper right thigh. 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 Chronic endplate/compression fractures affecting L1, L3, and L5. IMPRESSION: 1. Acute fractures of the proximal right humeral neck and intertrochanteric right femur. 2. No intrathoracic or intra-abdominal injury is seen. 3. Diffuse pancreatic ductal dilatation since 2013. No visible underlying mass, consider pancreas MRI follow-up if appropriate for comorbidities. 4. Wedge-shaped area heterogeneous/hypoenhancement in the left upper pole kidney, morphology favoring infarct over infection or lesion. Attention at any follow-up. 5. Numerous chronic findings are described above. Electronically Signed   By: Tiburcio Pea M.D.   On: 02/05/2023 06:40   DG Chest Port 1 View  Result Date: 02/05/2023 CLINICAL DATA:  Fall with femur fracture. EXAM: PORTABLE CHEST 1 VIEW COMPARISON:  01/26/2023 FINDINGS: Right humeral neck fracture, there is pending shoulder radiograph. Normal heart size and mediastinal contours. Mitral annular calcification. There is no edema, consolidation, effusion, or pneumothorax. Artifact from EKG leads. IMPRESSION: 1. No evidence of acute cardiopulmonary disease. 2. Right proximal humerus fracture. Electronically Signed   By: Tiburcio Pea M.D.   On: 02/05/2023 06:24   DG Hip Unilat W or Wo Pelvis 2-3 Views Right  Result Date: 02/05/2023 CLINICAL DATA:   Fall  with right hip pain EXAM: DG HIP (WITH OR WITHOUT PELVIS) 3V RIGHT COMPARISON:  None Available. FINDINGS: Acute intertrochanteric right femur fracture with comminution and varus deformity. Both hips are located. There is pelvic bone osteopenia without acute fracture. Remote and nonunited left greater trochanter fracture with corticated margins. Degenerative spurring at both sacroiliac joints. IMPRESSION: 1. Acute intertrochanteric right femur fracture. 2. Chronic left greater trochanter femur fracture. Electronically Signed   By: Tiburcio Pea M.D.   On: 02/05/2023 06:23   DG Shoulder Right  Result Date: 02/05/2023 CLINICAL DATA:  Status post fall.  Complains of right shoulder pain. EXAM: RIGHT SHOULDER - 2+ VIEW COMPARISON:  None Available. FINDINGS: There is an acute, impacted, comminuted fracture deformity involving the surgical neck of the humerus. No signs of dislocation. IMPRESSION: Acute, impacted, comminuted fracture deformity involving the surgical neck of the humerus. Electronically Signed   By: Signa Kell M.D.   On: 02/05/2023 06:22   CT Head Wo Contrast  Result Date: 02/05/2023 CLINICAL DATA:  Neck trauma.  Minor head trauma EXAM: CT HEAD WITHOUT CONTRAST CT CERVICAL SPINE WITHOUT CONTRAST TECHNIQUE: Multidetector CT imaging of the head and cervical spine was performed following the standard protocol without intravenous contrast. Multiplanar CT image reconstructions of the cervical spine were also generated. RADIATION DOSE REDUCTION: This exam was performed according to the departmental dose-optimization program which includes automated exposure control, adjustment of the mA and/or kV according to patient size and/or use of iterative reconstruction technique. COMPARISON:  01/26/2023 FINDINGS: CT HEAD FINDINGS Brain: No evidence of acute infarction, hemorrhage, hydrocephalus, extra-axial collection or mass lesion/mass effect. Generalized atrophy. Fairly extensive chronic small vessel  ischemic low-density in the cerebral white matter and deep gray nuclei. Vascular: No hyperdense vessel or unexpected calcification. Skull: No acute finding. Small inner table osteoma at the left middle cranial fossa. Sinuses/Orbits: No evidence of injury CT CERVICAL SPINE FINDINGS Alignment: Normal. Skull base and vertebrae: No acute fracture. No primary bone lesion or focal pathologic process. Soft tissues and spinal canal: No prevertebral fluid or swelling. No visible canal hematoma. Disc levels:  Ordinary cervical spine degeneration. Upper chest: Clear apical lungs IMPRESSION: No evidence of acute intracranial or cervical spine injury. Electronically Signed   By: Tiburcio Pea M.D.   On: 02/05/2023 06:09   CT Cervical Spine Wo Contrast  Result Date: 02/05/2023 CLINICAL DATA:  Neck trauma.  Minor head trauma EXAM: CT HEAD WITHOUT CONTRAST CT CERVICAL SPINE WITHOUT CONTRAST TECHNIQUE: Multidetector CT imaging of the head and cervical spine was performed following the standard protocol without intravenous contrast. Multiplanar CT image reconstructions of the cervical spine were also generated. RADIATION DOSE REDUCTION: This exam was performed according to the departmental dose-optimization program which includes automated exposure control, adjustment of the mA and/or kV according to patient size and/or use of iterative reconstruction technique. COMPARISON:  01/26/2023 FINDINGS: CT HEAD FINDINGS Brain: No evidence of acute infarction, hemorrhage, hydrocephalus, extra-axial collection or mass lesion/mass effect. Generalized atrophy. Fairly extensive chronic small vessel ischemic low-density in the cerebral white matter and deep gray nuclei. Vascular: No hyperdense vessel or unexpected calcification. Skull: No acute finding. Small inner table osteoma at the left middle cranial fossa. Sinuses/Orbits: No evidence of injury CT CERVICAL SPINE FINDINGS Alignment: Normal. Skull base and vertebrae: No acute fracture. No  primary bone lesion or focal pathologic process. Soft tissues and spinal canal: No prevertebral fluid or swelling. No visible canal hematoma. Disc levels:  Ordinary cervical spine degeneration. Upper chest: Clear apical lungs IMPRESSION:  No evidence of acute intracranial or cervical spine injury. Electronically Signed   By: Tiburcio Pea M.D.   On: 02/05/2023 06:09    Review of Systems  HENT:  Negative for ear discharge, ear pain, hearing loss and tinnitus.   Eyes:  Negative for photophobia and pain.  Respiratory:  Negative for cough and shortness of breath.   Cardiovascular:  Negative for chest pain.  Gastrointestinal:  Negative for abdominal pain, nausea and vomiting.  Genitourinary:  Negative for dysuria, flank pain, frequency and urgency.  Musculoskeletal:  Positive for arthralgias (Right hip and shoulder). Negative for back pain, myalgias and neck pain.  Neurological:  Negative for dizziness and headaches.  Hematological:  Does not bruise/bleed easily.  Psychiatric/Behavioral:  The patient is not nervous/anxious.    Blood pressure 127/82, pulse (!) 107, temperature 98 F (36.7 C), temperature source Oral, resp. rate 13, height 5\' 2"  (1.575 m), weight 55 kg, SpO2 100%. Physical Exam Constitutional:      General: She is not in acute distress.    Appearance: She is well-developed. She is not diaphoretic.  HENT:     Head: Normocephalic and atraumatic.  Eyes:     General: No scleral icterus.       Right eye: No discharge.        Left eye: No discharge.     Conjunctiva/sclera: Conjunctivae normal.  Cardiovascular:     Rate and Rhythm: Normal rate and regular rhythm.  Pulmonary:     Effort: Pulmonary effort is normal. No respiratory distress.  Musculoskeletal:     Cervical back: Normal range of motion.     Comments: Right shoulder, elbow, wrist, digits- no skin wounds, in sling, no instability, no blocks to motion  Sens  Ax/R/M/U intact  Mot   Ax/ R/ PIN/ M/ AIN/ U intact  Rad  2+  RLE No traumatic wounds, ecchymosis, or rash  Mild TTP hip  No knee or ankle effusion  Knee stable to varus/ valgus and anterior/posterior stress  Sens DPN, SPN, TN intact  Motor EHL, ext, flex, evers 5/5  DP 1+, PT 0, No significant edema  Skin:    General: Skin is warm and dry.  Neurological:     Mental Status: She is alert.  Psychiatric:        Mood and Affect: Mood normal.        Behavior: Behavior normal.    Assessment/Plan: Right humerus fx -- Plan non-operative management with sling and NWB. Right hip fx -- Plan IMN, likely tomorrow with Dr. Jena Gauss but may be Friday or Saturday.    Freeman Caldron, PA-C Orthopedic Surgery [phone] 02/05/2023, 9:28 AM

## 2023-02-06 ENCOUNTER — Inpatient Hospital Stay (HOSPITAL_COMMUNITY): Payer: Medicare PPO

## 2023-02-06 ENCOUNTER — Encounter (HOSPITAL_COMMUNITY)
Admission: EM | Disposition: A | Discharge status: Skilled Nursing Facility | Payer: Self-pay | Source: Skilled Nursing Facility | Attending: Internal Medicine

## 2023-02-06 ENCOUNTER — Inpatient Hospital Stay (HOSPITAL_COMMUNITY): Payer: Medicare PPO | Admitting: Certified Registered Nurse Anesthetist

## 2023-02-06 ENCOUNTER — Other Ambulatory Visit: Payer: Self-pay

## 2023-02-06 DIAGNOSIS — S72141A Displaced intertrochanteric fracture of right femur, initial encounter for closed fracture: Secondary | ICD-10-CM | POA: Diagnosis not present

## 2023-02-06 DIAGNOSIS — Y92129 Unspecified place in nursing home as the place of occurrence of the external cause: Secondary | ICD-10-CM

## 2023-02-06 DIAGNOSIS — I4892 Unspecified atrial flutter: Secondary | ICD-10-CM

## 2023-02-06 DIAGNOSIS — I4891 Unspecified atrial fibrillation: Secondary | ICD-10-CM | POA: Diagnosis not present

## 2023-02-06 DIAGNOSIS — E039 Hypothyroidism, unspecified: Secondary | ICD-10-CM

## 2023-02-06 DIAGNOSIS — S72001A Fracture of unspecified part of neck of right femur, initial encounter for closed fracture: Secondary | ICD-10-CM | POA: Diagnosis not present

## 2023-02-06 DIAGNOSIS — I10 Essential (primary) hypertension: Secondary | ICD-10-CM

## 2023-02-06 DIAGNOSIS — I6310 Cerebral infarction due to embolism of unspecified precerebral artery: Secondary | ICD-10-CM

## 2023-02-06 DIAGNOSIS — E1165 Type 2 diabetes mellitus with hyperglycemia: Secondary | ICD-10-CM

## 2023-02-06 DIAGNOSIS — W19XXXA Unspecified fall, initial encounter: Secondary | ICD-10-CM | POA: Diagnosis not present

## 2023-02-06 DIAGNOSIS — Z87891 Personal history of nicotine dependence: Secondary | ICD-10-CM | POA: Diagnosis not present

## 2023-02-06 HISTORY — PX: INTRAMEDULLARY (IM) NAIL INTERTROCHANTERIC: SHX5875

## 2023-02-06 HISTORY — PX: FRACTURE SURGERY: SHX138

## 2023-02-06 LAB — CBC WITH DIFFERENTIAL/PLATELET
Abs Immature Granulocytes: 0.1 10*3/uL — ABNORMAL HIGH (ref 0.00–0.07)
Basophils Absolute: 0 10*3/uL (ref 0.0–0.1)
Basophils Relative: 0 %
Eosinophils Absolute: 0 10*3/uL (ref 0.0–0.5)
Eosinophils Relative: 0 %
HCT: 33.1 % — ABNORMAL LOW (ref 36.0–46.0)
Hemoglobin: 11.5 g/dL — ABNORMAL LOW (ref 12.0–15.0)
Immature Granulocytes: 1 %
Lymphocytes Relative: 6 %
Lymphs Abs: 0.9 10*3/uL (ref 0.7–4.0)
MCH: 29.9 pg (ref 26.0–34.0)
MCHC: 34.7 g/dL (ref 30.0–36.0)
MCV: 86.2 fL (ref 80.0–100.0)
Monocytes Absolute: 0.9 10*3/uL (ref 0.1–1.0)
Monocytes Relative: 6 %
Neutro Abs: 12.5 10*3/uL — ABNORMAL HIGH (ref 1.7–7.7)
Neutrophils Relative %: 87 %
Platelets: 133 10*3/uL — ABNORMAL LOW (ref 150–400)
RBC: 3.84 MIL/uL — ABNORMAL LOW (ref 3.87–5.11)
RDW: 14.7 % (ref 11.5–15.5)
WBC: 14.3 10*3/uL — ABNORMAL HIGH (ref 4.0–10.5)
nRBC: 1 % — ABNORMAL HIGH (ref 0.0–0.2)

## 2023-02-06 LAB — GLUCOSE, CAPILLARY
Glucose-Capillary: 278 mg/dL — ABNORMAL HIGH (ref 70–99)
Glucose-Capillary: 231 mg/dL — ABNORMAL HIGH (ref 70–99)
Glucose-Capillary: 246 mg/dL — ABNORMAL HIGH (ref 70–99)
Glucose-Capillary: 228 mg/dL — ABNORMAL HIGH (ref 70–99)
Glucose-Capillary: 176 mg/dL — ABNORMAL HIGH (ref 70–99)

## 2023-02-06 LAB — PREPARE RBC (CROSSMATCH)

## 2023-02-06 LAB — MAGNESIUM: Magnesium: 1.9 mg/dL (ref 1.7–2.4)

## 2023-02-06 LAB — VITAMIN D 25 HYDROXY (VIT D DEFICIENCY, FRACTURES): Vit D, 25-Hydroxy: 36.09 ng/mL (ref 30–100)

## 2023-02-06 SURGERY — FIXATION, FRACTURE, INTERTROCHANTERIC, WITH INTRAMEDULLARY ROD
Anesthesia: General | Site: Leg Upper | Laterality: Right

## 2023-02-06 MED ORDER — DEXAMETHASONE SODIUM PHOSPHATE 10 MG/ML IJ SOLN
INTRAMUSCULAR | Status: DC | PRN
  Administered 2023-02-06: 4 mg via INTRAVENOUS

## 2023-02-06 MED ORDER — SODIUM CHLORIDE 0.9% IV SOLUTION: Freq: Once | INTRAVENOUS | Status: DC

## 2023-02-06 MED ORDER — SODIUM CHLORIDE 0.9 % IV SOLN: INTRAVENOUS | Status: AC

## 2023-02-06 MED ORDER — 0.9 % SODIUM CHLORIDE (POUR BTL) OPTIME
TOPICAL | Status: DC | PRN
  Administered 2023-02-06: 1000 mL

## 2023-02-06 MED ORDER — ROCURONIUM BROMIDE 10 MG/ML (PF) SYRINGE
PREFILLED_SYRINGE | INTRAVENOUS | Status: AC
  Filled 2023-02-06: qty 10

## 2023-02-06 MED ORDER — LACTATED RINGERS IV SOLN: INTRAVENOUS | Status: DC

## 2023-02-06 MED ORDER — PROPOFOL 10 MG/ML IV BOLUS
INTRAVENOUS | Status: AC
  Filled 2023-02-06: qty 20

## 2023-02-06 MED ORDER — LIDOCAINE 2% (20 MG/ML) 5 ML SYRINGE
INTRAMUSCULAR | Status: AC
  Filled 2023-02-06: qty 5

## 2023-02-06 MED ORDER — ROCURONIUM BROMIDE 10 MG/ML (PF) SYRINGE
PREFILLED_SYRINGE | INTRAVENOUS | Status: DC | PRN
  Administered 2023-02-06: 50 mg via INTRAVENOUS

## 2023-02-06 MED ORDER — CEFAZOLIN SODIUM-DEXTROSE 2-4 GM/100ML-% IV SOLN
2.0000 g | INTRAVENOUS | Status: AC
  Administered 2023-02-06: 2 g via INTRAVENOUS

## 2023-02-06 MED ORDER — METOCLOPRAMIDE HCL 5 MG PO TABS: 5.0000 mg | ORAL_TABLET | Freq: Three times a day (TID) | ORAL | Status: DC | PRN

## 2023-02-06 MED ORDER — ONDANSETRON HCL 4 MG/2ML IJ SOLN
INTRAMUSCULAR | Status: AC
  Filled 2023-02-06: qty 2

## 2023-02-06 MED ORDER — DEXAMETHASONE SODIUM PHOSPHATE 10 MG/ML IJ SOLN
INTRAMUSCULAR | Status: AC
  Filled 2023-02-06: qty 1

## 2023-02-06 MED ORDER — POVIDONE-IODINE 10 % EX SWAB
2.0000 | Freq: Once | CUTANEOUS | Status: AC
  Administered 2023-02-06: 2 via TOPICAL

## 2023-02-06 MED ORDER — SUGAMMADEX SODIUM 200 MG/2ML IV SOLN
INTRAVENOUS | Status: DC | PRN
  Administered 2023-02-06: 200 mg via INTRAVENOUS

## 2023-02-06 MED ORDER — VANCOMYCIN HCL 1000 MG IV SOLR
INTRAVENOUS | Status: AC
  Filled 2023-02-06: qty 20

## 2023-02-06 MED ORDER — METOCLOPRAMIDE HCL 5 MG/ML IJ SOLN: 5.0000 mg | Freq: Three times a day (TID) | INTRAMUSCULAR | Status: DC | PRN

## 2023-02-06 MED ORDER — ACETAMINOPHEN 325 MG PO TABS
325.0000 mg | ORAL_TABLET | Freq: Four times a day (QID) | ORAL | Status: DC | PRN
  Administered 2023-02-06: 325 mg via ORAL
  Administered 2023-02-08 – 2023-02-09 (×3): 650 mg via ORAL
  Filled 2023-02-06: qty 1
  Filled 2023-02-06 (×3): qty 2

## 2023-02-06 MED ORDER — LIDOCAINE 2% (20 MG/ML) 5 ML SYRINGE
INTRAMUSCULAR | Status: DC | PRN
  Administered 2023-02-06: 40 mg via INTRAVENOUS

## 2023-02-06 MED ORDER — FENTANYL CITRATE (PF) 250 MCG/5ML IJ SOLN
INTRAMUSCULAR | Status: DC | PRN
  Administered 2023-02-06: 50 ug via INTRAVENOUS

## 2023-02-06 MED ORDER — PHENYLEPHRINE 80 MCG/ML (10ML) SYRINGE FOR IV PUSH (FOR BLOOD PRESSURE SUPPORT)
PREFILLED_SYRINGE | INTRAVENOUS | Status: DC | PRN
  Administered 2023-02-06 (×2): 160 ug via INTRAVENOUS
  Administered 2023-02-06: 80 ug via INTRAVENOUS

## 2023-02-06 MED ORDER — PHENYLEPHRINE HCL-NACL 20-0.9 MG/250ML-% IV SOLN
INTRAVENOUS | Status: DC | PRN
  Administered 2023-02-06: 25 ug/min via INTRAVENOUS

## 2023-02-06 MED ORDER — CHLORHEXIDINE GLUCONATE 4 % EX SOLN: 60.0000 mL | Freq: Once | CUTANEOUS | Status: DC

## 2023-02-06 MED ORDER — ONDANSETRON HCL 4 MG/2ML IJ SOLN
INTRAMUSCULAR | Status: DC | PRN
  Administered 2023-02-06: 4 mg via INTRAVENOUS

## 2023-02-06 MED ORDER — TRANEXAMIC ACID-NACL 1000-0.7 MG/100ML-% IV SOLN
1000.0000 mg | Freq: Once | INTRAVENOUS | Status: AC
  Administered 2023-02-06: 1000 mg via INTRAVENOUS
  Filled 2023-02-06: qty 100

## 2023-02-06 MED ORDER — ORAL CARE MOUTH RINSE: 15.0000 mL | Freq: Once | OROMUCOSAL | Status: AC

## 2023-02-06 MED ORDER — SODIUM CHLORIDE 0.9 % IV SOLN
INTRAVENOUS | Status: DC | PRN
Start: 2023-02-06 — End: 2023-02-06

## 2023-02-06 MED ORDER — VANCOMYCIN HCL 1000 MG IV SOLR: INTRAVENOUS | Status: DC | PRN

## 2023-02-06 MED ORDER — CEFAZOLIN SODIUM-DEXTROSE 2-4 GM/100ML-% IV SOLN
INTRAVENOUS | Status: AC
  Administered 2023-02-07: 2 g via INTRAVENOUS
  Filled 2023-02-06: qty 100

## 2023-02-06 MED ORDER — PROPOFOL 10 MG/ML IV BOLUS
INTRAVENOUS | Status: DC | PRN
  Administered 2023-02-06: 70 mg via INTRAVENOUS

## 2023-02-06 MED ORDER — ONDANSETRON HCL 4 MG PO TABS: 4.0000 mg | ORAL_TABLET | Freq: Four times a day (QID) | ORAL | Status: DC | PRN

## 2023-02-06 MED ORDER — DOCUSATE SODIUM 100 MG PO CAPS
100.0000 mg | ORAL_CAPSULE | Freq: Two times a day (BID) | ORAL | Status: DC
  Administered 2023-02-06 – 2023-02-11 (×11): 100 mg via ORAL
  Filled 2023-02-06 (×10): qty 1

## 2023-02-06 MED ORDER — CEFAZOLIN SODIUM-DEXTROSE 2-4 GM/100ML-% IV SOLN
2.0000 g | Freq: Two times a day (BID) | INTRAVENOUS | Status: AC
  Administered 2023-02-06: 2 g via INTRAVENOUS
  Filled 2023-02-06 (×2): qty 100

## 2023-02-06 MED ORDER — FENTANYL CITRATE (PF) 250 MCG/5ML IJ SOLN
INTRAMUSCULAR | Status: AC
  Filled 2023-02-06: qty 5

## 2023-02-06 MED ORDER — CHLORHEXIDINE GLUCONATE 0.12 % MT SOLN
15.0000 mL | Freq: Once | OROMUCOSAL | Status: AC
  Administered 2023-02-06: 15 mL via OROMUCOSAL

## 2023-02-06 MED ORDER — ONDANSETRON HCL 4 MG/2ML IJ SOLN: 4.0000 mg | Freq: Four times a day (QID) | INTRAMUSCULAR | Status: DC | PRN

## 2023-02-06 MED ORDER — SODIUM CHLORIDE 0.9 % IV SOLN: INTRAVENOUS | Status: DC

## 2023-02-06 SURGICAL SUPPLY — 48 items
ADH SKN CLS APL DERMABOND .7 (GAUZE/BANDAGES/DRESSINGS) ×1
APL PRP STRL LF DISP 70% ISPRP (MISCELLANEOUS)
BAG COUNTER SPONGE SURGICOUNT (BAG) IMPLANT
BAG SPNG CNTER NS LX DISP (BAG)
BIT DRILL INTERTAN LAG SCREW (BIT) IMPLANT
BIT DRILL LONG 4.0 (BIT) IMPLANT
BRUSH SCRUB EZ PLAIN DRY (MISCELLANEOUS) ×2 IMPLANT
CHLORAPREP W/TINT 26 (MISCELLANEOUS) ×1 IMPLANT
COVER PERINEAL POST (MISCELLANEOUS) ×1 IMPLANT
COVER SURGICAL LIGHT HANDLE (MISCELLANEOUS) ×1 IMPLANT
DERMABOND ADVANCED .7 DNX12 (GAUZE/BANDAGES/DRESSINGS) ×1 IMPLANT
DRAPE C-ARM 35X43 STRL (DRAPES) ×1 IMPLANT
DRAPE IMP U-DRAPE 54X76 (DRAPES) ×2 IMPLANT
DRAPE INCISE IOBAN 66X45 STRL (DRAPES) ×1 IMPLANT
DRAPE STERI IOBAN 125X83 (DRAPES) ×1 IMPLANT
DRAPE SURG 17X23 STRL (DRAPES) ×2 IMPLANT
DRAPE U-SHAPE 47X51 STRL (DRAPES) ×1 IMPLANT
DRESSING MEPILEX FLEX 4X4 (GAUZE/BANDAGES/DRESSINGS) ×1 IMPLANT
DRILL BIT LONG 4.0 (BIT) ×1
DRSG MEPILEX FLEX 4X4 (GAUZE/BANDAGES/DRESSINGS) ×1
DRSG MEPILEX POST OP 4X8 (GAUZE/BANDAGES/DRESSINGS) ×1 IMPLANT
ELECT REM PT RETURN 9FT ADLT (ELECTROSURGICAL) ×1
ELECTRODE REM PT RTRN 9FT ADLT (ELECTROSURGICAL) ×1 IMPLANT
GLOVE BIO SURGEON STRL SZ 6.5 (GLOVE) ×3 IMPLANT
GLOVE BIO SURGEON STRL SZ7.5 (GLOVE) ×4 IMPLANT
GLOVE BIOGEL PI IND STRL 6.5 (GLOVE) ×1 IMPLANT
GLOVE BIOGEL PI IND STRL 7.5 (GLOVE) ×1 IMPLANT
GOWN STRL REUS W/ TWL LRG LVL3 (GOWN DISPOSABLE) ×1 IMPLANT
GOWN STRL REUS W/TWL LRG LVL3 (GOWN DISPOSABLE) ×1
GUIDE PIN 3.2X343 (PIN) ×2
GUIDE PIN 3.2X343MM (PIN) ×2
KIT BASIN OR (CUSTOM PROCEDURE TRAY) ×1 IMPLANT
KIT TURNOVER KIT B (KITS) ×1 IMPLANT
MANIFOLD NEPTUNE II (INSTRUMENTS) ×1 IMPLANT
NAIL INTERTAN 10X18 130D 10S (Nail) IMPLANT
NS IRRIG 1000ML POUR BTL (IV SOLUTION) ×1 IMPLANT
PACK GENERAL/GYN (CUSTOM PROCEDURE TRAY) ×1 IMPLANT
PAD ARMBOARD 7.5X6 YLW CONV (MISCELLANEOUS) ×2 IMPLANT
PIN GUIDE 3.2X343MM (PIN) IMPLANT
SCREW LAG COMPR KIT 100/95 (Screw) IMPLANT
SCREW TRIGEN LOW PROF 5.0X32.5 (Screw) IMPLANT
SUT MNCRL AB 3-0 PS2 18 (SUTURE) ×1 IMPLANT
SUT VIC AB 0 CT1 27 (SUTURE)
SUT VIC AB 0 CT1 27XBRD ANBCTR (SUTURE) IMPLANT
SUT VIC AB 2-0 CT1 27 (SUTURE) ×1
SUT VIC AB 2-0 CT1 TAPERPNT 27 (SUTURE) ×2 IMPLANT
TOWEL GREEN STERILE (TOWEL DISPOSABLE) ×2 IMPLANT
WATER STERILE IRR 1000ML POUR (IV SOLUTION) ×1 IMPLANT

## 2023-02-06 NOTE — Interval H&P Note (Signed)
History and Physical Interval Note:  02/06/2023 7:29 AM  Barbara Butler  has presented today for surgery, with the diagnosis of right intertrochanteric fracture.  The various methods of treatment have been discussed with the patient and family. After consideration of risks, benefits and other options for treatment, the patient has consented to  Procedure(s): INTRAMEDULLARY (IM) NAIL INTERTROCHANTERIC (Right) as a surgical intervention.  The patient's history has been reviewed, patient examined, no change in status, stable for surgery.  I have reviewed the patient's chart and labs.  Questions were answered to the patient's satisfaction.     Caryn Bee P 

## 2023-02-06 NOTE — Plan of Care (Signed)
  Problem: Education: Goal: Knowledge of disease or condition will improve Outcome: Progressing Goal: Knowledge of secondary prevention will improve (MUST DOCUMENT ALL) Outcome: Progressing Goal: Knowledge of patient specific risk factors will improve (Mark N/A or DELETE if not current risk factor) Outcome: Progressing   

## 2023-02-06 NOTE — Discharge Instructions (Signed)
Orthopaedic Trauma Service Discharge Instructions   General Discharge Instructions  WEIGHT BEARING STATUS: Weightbearing as tolerated  RANGE OF MOTION/ACTIVITY: Unrestricted motion of the hip and knee  Wound Care: You may remove your surgical dressing on postop day #2, (date Saturday, 02/08/2023). Incisions can be left open to air if there is no drainage. Once the incision is completely dry and without drainage, it may be left open to air out.  Showering may begin postoperative day #3, (date Sunday, 02/09/2023).  Clean incision gently with soap and water.  DVT/PE prophylaxis:  Home dose Eliquis  Diet: as you were eating previously.  Can use over the counter stool softeners and bowel preparations, such as Miralax, to help with bowel movements.  Narcotics can be constipating.  Be sure to drink plenty of fluids  PAIN MEDICATION USE AND EXPECTATIONS  You have likely been given narcotic medications to help control your pain.  After a traumatic event that results in an fracture (broken bone) with or without surgery, it is ok to use narcotic pain medications to help control one's pain.  We understand that everyone responds to pain differently and each individual patient will be evaluated on a regular basis for the continued need for narcotic medications. Ideally, narcotic medication use should last no more than 6-8 weeks (coinciding with fracture healing).   As a patient it is your responsibility as well to monitor narcotic medication use and report the amount and frequency you use these medications when you come to your office visit.   We would also advise that if you are using narcotic medications, you should take a dose prior to therapy to maximize you participation.  IF YOU ARE ON NARCOTIC MEDICATIONS IT IS NOT PERMISSIBLE TO OPERATE A MOTOR VEHICLE (MOTORCYCLE/CAR/TRUCK/MOPED) OR HEAVY MACHINERY DO NOT MIX NARCOTICS WITH OTHER CNS (CENTRAL NERVOUS SYSTEM) DEPRESSANTS SUCH AS ALCOHOL   STOP  SMOKING OR USING NICOTINE PRODUCTS!!!!  As discussed nicotine severely impairs your body's ability to heal surgical and traumatic wounds but also impairs bone healing.  Wounds and bone heal by forming microscopic blood vessels (angiogenesis) and nicotine is a vasoconstrictor (essentially, shrinks blood vessels).  Therefore, if vasoconstriction occurs to these microscopic blood vessels they essentially disappear and are unable to deliver necessary nutrients to the healing tissue.  This is one modifiable factor that you can do to dramatically increase your chances of healing your injury.    (This means no smoking, no nicotine gum, patches, etc)  DO NOT USE NONSTEROIDAL ANTI-INFLAMMATORY DRUGS (NSAID'S)  Using products such as Advil (ibuprofen), Aleve (naproxen), Motrin (ibuprofen) for additional pain control during fracture healing can delay and/or prevent the healing response.  If you would like to take over the counter (OTC) medication, Tylenol (acetaminophen) is ok.  However, some narcotic medications that are given for pain control contain acetaminophen as well. Therefore, you should not exceed more than 4000 mg of tylenol in a day if you do not have liver disease.  Also note that there are may OTC medicines, such as cold medicines and allergy medicines that my contain tylenol as well.  If you have any questions about medications and/or interactions please ask your doctor/PA or your pharmacist.      ICE AND ELEVATE INJURED/OPERATIVE EXTREMITY  Using ice and elevating the injured extremity above your heart can help with swelling and pain control.  Icing in a pulsatile fashion, such as 20 minutes on and 20 minutes off, can be followed.    Do not place  ice directly on skin. Make sure there is a barrier between to skin and the ice pack.    Using frozen items such as frozen peas works well as the conform nicely to the are that needs to be iced.  USE AN ACE WRAP OR TED HOSE FOR SWELLING CONTROL  In  addition to icing and elevation, Ace wraps or TED hose are used to help limit and resolve swelling.  It is recommended to use Ace wraps or TED hose until you are informed to stop.    When using Ace Wraps start the wrapping distally (farthest away from the body) and wrap proximally (closer to the body)   Example: If you had surgery on your leg or thing and you do not have a splint on, start the ace wrap at the toes and work your way up to the thigh        If you had surgery on your upper extremity and do not have a splint on, start the ace wrap at your fingers and work your way up to the upper arm  CALL THE OFFICE WITH ANY QUESTIONS OR CONCERNS: [phone]   VISIT OUR WEBSITE FOR ADDITIONAL INFORMATION: [email or web address]    Discharge Wound Care Instructions  Do NOT apply any ointments, solutions or lotions to pin sites or surgical wounds.  These prevent needed drainage and even though solutions like hydrogen peroxide kill bacteria, they also damage cells lining the pin sites that help fight infection.  Applying lotions or ointments can keep the wounds moist and can cause them to breakdown and open up as well. This can increase the risk for infection. When in doubt call the office.  Surgical incisions should be dressed daily.  If any drainage is noted, use foam dressing (Mepilex) - These dressing supplies should be available at local medical supply stores [facility], [facility], etc) as well as Insurance claims handler ([facility], [facility], [employer], etc)  Once the incision is completely dry and without drainage, it may be left open to air out.  Showering may begin 36-48 hours later.  Cleaning gently with soap and water.

## 2023-02-06 NOTE — Anesthesia Preprocedure Evaluation (Addendum)
Anesthesia Evaluation  Patient identified by MRN, date of birth, ID band Patient awake    Reviewed: Allergy & Precautions, NPO status , Patient's Chart, lab work & pertinent test results  Airway Mallampati: II  TM Distance: >3 FB Neck ROM: Full    Dental  (+) Missing, Chipped, Poor Dentition, Dental Advisory Given   Pulmonary former smoker   breath sounds clear to auscultation       Cardiovascular hypertension, Pt. on medications and Pt. on home beta blockers + dysrhythmias  Rhythm:Regular Rate:Normal     Neuro/Psych CVA  negative psych ROS   GI/Hepatic negative GI ROS, Neg liver ROS,,,  Endo/Other  diabetes, Type 2, Oral Hypoglycemic AgentsHypothyroidism    Renal/GU negative Renal ROS  negative genitourinary   Musculoskeletal negative musculoskeletal ROS (+)    Abdominal   Peds  Hematology   Anesthesia Other Findings   Reproductive/Obstetrics                             Anesthesia Physical Anesthesia Plan  ASA: 3  Anesthesia Plan: General   Post-op Pain Management: Tylenol PO (pre-op)*   Induction: Intravenous  PONV Risk Score and Plan: 4 or greater and Ondansetron and Treatment may vary due to age or medical condition  Airway Management Planned: Oral ETT  Additional Equipment: None  Intra-op Plan:   Post-operative Plan: Extubation in OR  Informed Consent:    Patient has DNR.  Discussed DNR with patient and Continue DNR.     Plan Discussed with: CRNA  Anesthesia Plan Comments: (Lab Results      Component                Value               Date                      WBC                      11.2 (H)            02/05/2023                HGB                      7.1 (L)             02/05/2023                HCT                      21.3 (L)            02/05/2023                MCV                      87.3                02/05/2023                PLT                       156                 02/05/2023            Lab Results  Component                Value               Date                      INR                      1.7 (H)             02/05/2023                INR                      1.3 (H)             01/26/2023           )       Anesthesia Quick Evaluation

## 2023-02-06 NOTE — Anesthesia Procedure Notes (Signed)
Procedure Name: Intubation Date/Time: 02/06/2023 7:45 AM  Performed by: Waynard Edwards, CRNAPre-anesthesia Checklist: Patient identified, Emergency Drugs available, Suction available and Patient being monitored Patient Re-evaluated:Patient Re-evaluated prior to induction Oxygen Delivery Method: Circle system utilized Preoxygenation: Pre-oxygenation with 100% oxygen Induction Type: IV induction Ventilation: Mask ventilation without difficulty Laryngoscope Size: Mac and 3 Grade View: Grade I Tube type: Oral Tube size: 7.0 mm Number of attempts: 2 Airway Equipment and Method: Stylet Placement Confirmation: ETT inserted through vocal cords under direct vision, positive ETCO2 and breath sounds checked- equal and bilateral Secured at: 20 cm Tube secured with: Tape Dental Injury: Teeth and Oropharynx as per pre-operative assessment  Comments: DL x 1 by paramedic student.  No view.  DL x 2 by CRNA with grade 1 view.  ETT placed atraumatically.  EBBS and VSS

## 2023-02-06 NOTE — Progress Notes (Signed)
PROGRESS NOTE    Barbara Butler  ZOX:[id number] DOB: 02/16/1930 DOA: 02/05/2023 PCP: [name], MD    Chief Complaint  Patient presents with   Fall    Patient to ED via EMS with complaint of mechanical fall around 1900 last night. Patient takes eliquis. Patient unable to ambulate after fall. Patient independent ADL at baseline. EMS reports patient states right shoulder pain and right hip pain. EMS reports right leg shortening and rotation.    Brief Narrative:  Patient is a 89 year old female history of diabetes, hypertension, hypothyroidism, recent CVA, A-fib on Eliquis presented with a fall.  Patient noted to have recently been hospitalized 7/21-22 for subacute CVA and discharged to [facility].  Patient noted to have had an accident where she fell hurting her right shoulder and right lower extremity.  Patient seen in the ED, imaging done concerning for right femur fracture and right humeral neck fracture.  Orthopedics consulted.   Assessment & Plan:   Principal Problem:   Closed right hip fracture, initial encounter [facility]) Active Problems:   Type 2 diabetes mellitus with hyperglycemia, without long-term current use of insulin (HCC)   Hypertension   Hypothyroidism   Cerebrovascular accident (CVA) (HCC)   Atrial fibrillation/flutter (HCC)   Fall at nursing home, initial encounter   Humeral surgical neck fracture   DNR (do not resuscitate)   Fall  #1 closed right hip fracture -Secondary to mechanical fall at SNF resulting in right hip fracture. -Patient seen in consultation by orthopedics and patient subsequently underwent cephalomedullary nailing of right intertrochanteric femur fracture, today 02/06/2023 per orthopedics. -Continue current pain regimen of Tylenol, Robaxin, oxycodone, morphine as needed. -PT/OT. -Will need outpatient follow-up with orthopedics. -Per orthopedics  2.  Right humerus fracture -Patient noted with a right surgical neck humerus fracture,  secondary to mechanical fall. -Patient seen in consult by orthopedics who are recommending conservative management 6, nonoperative management at this time with sling and NWB. -Outpatient follow-up.  3.  AKI -Patient appears to have CKD stage IIIa. -Stable.  Follow.  4.  Prolonged QTc -Avoid QT prolonging medications such as PPI, nausea medications, SSRIs. -Repeat EKG pending.  5.  Recent CVA -Patient hospitalized recently earlier on this month for CVA. -Patient with history of new onset A-fib and also at increased risk for stroke recurrence without anticoagulation. -Consider early resumption of Eliquis postop.  6.  Atrial fibrillation -Continue Toprol-XL for rate control. -Eliquis on hold. -Orthopedics to advise when Eliquis may be resumed.  7.  Hypertension -Continue Toprol-XL, amlodipine. -Olmesartan on hold.  8.  Diabetes mellitus type 2 -Hemoglobin A1c 7.9 (01/27/2023) -CBG 278 this morning. -Hold home regimen oral hypoglycemic agents. -Start Semglee 5 units daily. -Continue SSI.   DVT prophylaxis: Postop DVT prophylaxis per orthopedics. Code Status: DNR Family Communication: Updated patient.  No family at bedside. Disposition: Likely back to SNF when medically stable.  Status is: Inpatient Remains inpatient appropriate because: Severity of illness   Consultants:  Orthopedics: Dr. Jena Gauss 02/06/2023  Procedures:  CT head CT C-spine 02/05/2023 CT chest abdomen and pelvis 02/05/2023 Cephalomedullary nailing of right intratrochanteric femur fracture per orthopedics: Dr. Jena Gauss 02/06/2023  Antimicrobials:  Anti-infectives (From admission, onward)    Start     Dose/Rate Route Frequency Ordered Stop   02/06/23 2000  ceFAZolin (ANCEF) IVPB 2g/100 mL premix        2 g 200 mL/hr over 30 Minutes Intravenous Every 12 hours 02/06/23 1008 02/07/23 1959   02/06/23 0817  vancomycin (VANCOCIN) powder  Status:  Discontinued          As needed 02/06/23 0817 02/06/23 0845    02/06/23 0700  ceFAZolin (ANCEF) IVPB 2g/100 mL premix        2 g 200 mL/hr over 30 Minutes Intravenous On call to O.R. 02/06/23 1027 02/06/23 0823   02/06/23 0626  ceFAZolin (ANCEF) 2-4 GM/100ML-% IVPB       Note to Pharmacy: Gleason, Ginger E: cabinet override      02/06/23 0626 02/06/23 0753         Subjective: Sleeping but easily arousable.  Came from the OR this morning.  States improvement with right hip pain postoperatively.  Denies any chest pain, no shortness of breath, no abdominal pain.  Denies any lightheadedness or dizziness.  Denies any overt bleeding.  Objective: Vitals:   02/06/23 0930 02/06/23 0952 02/06/23 1007 02/06/23 1103  BP: 126/83 128/75 129/85 (!) 146/84  Pulse: 89 87 88 91  Resp: 20 18 16 16   Temp: 97.7 F (36.5 C) 97.7 F (36.5 C) (!) 97.5 F (36.4 C) (!) 97.4 F (36.3 C)  TempSrc: Oral Oral Oral Oral  SpO2: 94%  98% 99%  Weight:      Height:        Intake/Output Summary (Last 24 hours) at 02/06/2023 1728 Last data filed at 02/06/2023 1100 Gross per 24 hour  Intake 1077 ml  Output 25 ml  Net 1052 ml   Filed Weights   02/05/23 0527 02/06/23 0655  Weight: 55 kg 55 kg    Examination:  General exam: Appears calm and comfortable  Respiratory system: Clear to auscultation.  No wheezes, no crackles, no rhonchi.  Fair air movement.  Speaking in full sentences.  Respiratory effort normal. Cardiovascular system: S1 & S2 heard, RRR. No JVD, murmurs, rubs, gallops or clicks. No pedal edema. Gastrointestinal system: Abdomen is nondistended, soft and nontender. No organomegaly or masses felt. Normal bowel sounds heard. Central nervous system: Alert and oriented. No focal neurological deficits. Extremities: Ice pack on Right hip.  Right upper extremity in sling.  Skin: No rashes, lesions or ulcers Psychiatry: Judgement and insight appear normal. Mood & affect appropriate.     Data Reviewed: I have personally reviewed following labs and imaging  studies  CBC: Recent Labs  Lab 02/05/23 0531 02/05/23 0532 02/05/23 0543 02/05/23 2335 02/06/23 1235  WBC  --  11.3*  --  11.2* 14.3*  NEUTROABS  --  10.0*  --   --  12.5*  HGB 8.8* 8.8* 8.8* 7.1* 11.5*  HCT 26.0* 27.5* 26.0* 21.3* 33.1*  MCV  --  88.1  --  87.3 86.2  PLT  --  215  --  156 133*    Basic Metabolic Panel: Recent Labs  Lab 02/05/23 0531 02/05/23 0532 02/05/23 0543 02/05/23 1809 02/05/23 2335 02/06/23 1235  NA 136 136 136 135 135  --   K 4.2 4.2 4.3 3.9 4.6  --   CL 103 102  --  104 102  --   CO2  --  16*  --  20* 19*  --   GLUCOSE 323* 323*  --  176* 256*  --   BUN 26* 27*  --  31* 33*  --   CREATININE 1.00 1.26*  --  1.18* 1.11*  --   CALCIUM  --  8.9  --  8.7* 8.7*  --   MG  --   --   --   --   --  1.9  GFR: Estimated Creatinine Clearance: 26.1 mL/min (A) (by C-G formula based on SCr of 1.11 mg/dL (H)).  Liver Function Tests: Recent Labs  Lab 02/05/23 0532  AST 36  ALT 31  ALKPHOS 43  BILITOT 0.8  PROT 5.4*  ALBUMIN 3.1*    CBG: Recent Labs  Lab 02/05/23 1706 02/05/23 2007 02/06/23 0631 02/06/23 0857 02/06/23 1241  GLUCAP 159* 254* 278* 231* 246*     No results found for this or any previous visit (from the past 240 hour(s)).       Radiology Studies: DG HIP PORT UNILAT W OR W/O PELVIS 1V RIGHT  Result Date: 02/06/2023 CLINICAL DATA:  Intramedullary nail fixation of the proximal right femur. EXAM: DG HIP (WITH OR WITHOUT PELVIS) 1V PORT RIGHT COMPARISON:  Hip radiographs 1 day prior. FINDINGS: Postsurgical changes reflecting interval intramedullary nail fixation of the proximal right femoral fracture is seen. Hardware alignment is within expected limits, without evidence of immediate complication. Fracture alignment is improved. Femoroacetabular alignment is maintained. There is expected surrounding postoperative soft tissue gas. Heterotopic ossification about the proximal left femur is unchanged. IMPRESSION: Status post  intramedullary nail fixation of the proximal right femoral fracture without evidence of complication. Electronically Signed   By: Lesia Hausen M.D.   On: 02/06/2023 12:28   DG FEMUR, MIN 2 VIEWS RIGHT  Result Date: 02/06/2023 CLINICAL DATA:  Intramedullary nail in the right femur. EXAM: RIGHT FEMUR 2 VIEWS COMPARISON:  Hip radiographs 1 day prior FINDINGS: Six C-arm fluoroscopic images were obtained intraoperatively and submitted for post operative interpretation. Intraoperative images during intramedullary nail fixation of the proximal right femoral fracture are seen. Hardware alignment is within expected limits on the final images, without evidence of immediate complication. Fluoro time 48.7 seconds, dose 4.91 mGy. Please see the performing provider's procedural report for further detail. IMPRESSION: Intraoperative images during intramedullary nail fixation of the proximal right femur as above. Electronically Signed   By: Lesia Hausen M.D.   On: 02/06/2023 12:26   DG C-Arm 1-60 Min-No Report  Result Date: 02/06/2023 Fluoroscopy was utilized by the requesting physician.  No radiographic interpretation.   CT CHEST ABDOMEN PELVIS W CONTRAST  Result Date: 02/05/2023 CLINICAL DATA:  One poly trauma. EXAM: CT CHEST, ABDOMEN, AND PELVIS WITH CONTRAST TECHNIQUE: Multidetector CT imaging of the chest, abdomen and pelvis was performed following the standard protocol during bolus administration of intravenous contrast. RADIATION DOSE REDUCTION: This exam was performed according to the departmental dose-optimization program which includes automated exposure control, adjustment of the mA and/or kV according to patient size and/or use of iterative reconstruction technique. CONTRAST:  75mL OMNIPAQUE IOHEXOL 350 MG/ML SOLN COMPARISON:  None Available. FINDINGS: CT CHEST FINDINGS Cardiovascular: Biatrial enlargement. No pericardial effusion. No evidence of great vessel injury. Extensive atheromatous plaque affecting the  aorta and great vessels. Mediastinum/Nodes: No hematoma or pneumomediastinum Lungs/Pleura: No hemothorax, pneumothorax, or pulmonary contusion. Mild subpleural reticulation/scarring. Musculoskeletal: Impacted right humeral neck fracture also involving the lesser tuberosity. There is a lucency at the posterior glenoid on the right which appears corticated and chronic. Right glenohumeral osteoarthritis. Hemorrhage/edema involves the right upper extremity soft tissues around the fracture. CT ABDOMEN PELVIS FINDINGS Hepatobiliary: No hepatic injury or perihepatic hematoma. Elongated calcified stone in the gallbladder. Thickening with some cystic change at the fundus of the gallbladder, usually adenomyomatosis. Liver cyst measuring up to 4.2 cm in the left lobe. Pancreas: No acute finding. Diffuse dilatation of the main duct measuring up to 6 mm, new from 2013. No  underlying mass or cyst is seen. Generalized pancreatic atrophy. Spleen: No splenic injury or perisplenic hematoma. Adrenals/Urinary Tract: No adrenal hemorrhage or renal injury identified. There is a wedge-shaped area of hypoenhancing cortex at the upper pole left kidney measuring up to 3 cm on the delayed phase, without distortion of the capsule. Renal cysts on both sides measuring up to 4 cm. Bladder is unremarkable. Stomach/Bowel: No evidence of injury. Innumerable distal colonic diverticula. Vascular/Lymphatic: Atheromatous plaque.  No acute vascular finding. Reproductive: Unremarkable for age Other: No ascites or pneumoperitoneum. Fatty midline hernia at the umbilicus. Musculoskeletal: Acute intertrochanteric right femur fracture with extensive swelling in the upper right thigh. 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 Chronic endplate/compression fractures affecting L1, L3, and L5. IMPRESSION: 1. Acute fractures of the proximal right humeral neck and intertrochanteric right femur. 2. No intrathoracic or intra-abdominal injury is seen. 3.  Diffuse pancreatic ductal dilatation since 2013. No visible underlying mass, consider pancreas MRI follow-up if appropriate for comorbidities. 4. Wedge-shaped area heterogeneous/hypoenhancement in the left upper pole kidney, morphology favoring infarct over infection or lesion. Attention at any follow-up. 5. Numerous chronic findings are described above. Electronically Signed   By: Tiburcio Pea M.D.   On: 02/05/2023 06:40   DG Chest Port 1 View  Result Date: 02/05/2023 CLINICAL DATA:  Fall with femur fracture. EXAM: PORTABLE CHEST 1 VIEW COMPARISON:  01/26/2023 FINDINGS: Right humeral neck fracture, there is pending shoulder radiograph. Normal heart size and mediastinal contours. Mitral annular calcification. There is no edema, consolidation, effusion, or pneumothorax. Artifact from EKG leads. IMPRESSION: 1. No evidence of acute cardiopulmonary disease. 2. Right proximal humerus fracture. Electronically Signed   By: Tiburcio Pea M.D.   On: 02/05/2023 06:24   DG Hip Unilat W or Wo Pelvis 2-3 Views Right  Result Date: 02/05/2023 CLINICAL DATA:  Fall with right hip pain EXAM: DG HIP (WITH OR WITHOUT PELVIS) 3V RIGHT COMPARISON:  None Available. FINDINGS: Acute intertrochanteric right femur fracture with comminution and varus deformity. Both hips are located. There is pelvic bone osteopenia without acute fracture. Remote and nonunited left greater trochanter fracture with corticated margins. Degenerative spurring at both sacroiliac joints. IMPRESSION: 1. Acute intertrochanteric right femur fracture. 2. Chronic left greater trochanter femur fracture. Electronically Signed   By: Tiburcio Pea M.D.   On: 02/05/2023 06:23   DG Shoulder Right  Result Date: 02/05/2023 CLINICAL DATA:  Status post fall.  Complains of right shoulder pain. EXAM: RIGHT SHOULDER - 2+ VIEW COMPARISON:  None Available. FINDINGS: There is an acute, impacted, comminuted fracture deformity involving the surgical neck of the humerus.  No signs of dislocation. IMPRESSION: Acute, impacted, comminuted fracture deformity involving the surgical neck of the humerus. Electronically Signed   By: Signa Kell M.D.   On: 02/05/2023 06:22   CT Head Wo Contrast  Result Date: 02/05/2023 CLINICAL DATA:  Neck trauma.  Minor head trauma EXAM: CT HEAD WITHOUT CONTRAST CT CERVICAL SPINE WITHOUT CONTRAST TECHNIQUE: Multidetector CT imaging of the head and cervical spine was performed following the standard protocol without intravenous contrast. Multiplanar CT image reconstructions of the cervical spine were also generated. RADIATION DOSE REDUCTION: This exam was performed according to the departmental dose-optimization program which includes automated exposure control, adjustment of the mA and/or kV according to patient size and/or use of iterative reconstruction technique. COMPARISON:  01/26/2023 FINDINGS: CT HEAD FINDINGS Brain: No evidence of acute infarction, hemorrhage, hydrocephalus, extra-axial collection or mass lesion/mass effect. Generalized atrophy. Fairly extensive chronic small vessel ischemic  low-density in the cerebral white matter and deep gray nuclei. Vascular: No hyperdense vessel or unexpected calcification. Skull: No acute finding. Small inner table osteoma at the left middle cranial fossa. Sinuses/Orbits: No evidence of injury CT CERVICAL SPINE FINDINGS Alignment: Normal. Skull base and vertebrae: No acute fracture. No primary bone lesion or focal pathologic process. Soft tissues and spinal canal: No prevertebral fluid or swelling. No visible canal hematoma. Disc levels:  Ordinary cervical spine degeneration. Upper chest: Clear apical lungs IMPRESSION: No evidence of acute intracranial or cervical spine injury. Electronically Signed   By: Tiburcio Pea M.D.   On: 02/05/2023 06:09   CT Cervical Spine Wo Contrast  Result Date: 02/05/2023 CLINICAL DATA:  Neck trauma.  Minor head trauma EXAM: CT HEAD WITHOUT CONTRAST CT CERVICAL SPINE  WITHOUT CONTRAST TECHNIQUE: Multidetector CT imaging of the head and cervical spine was performed following the standard protocol without intravenous contrast. Multiplanar CT image reconstructions of the cervical spine were also generated. RADIATION DOSE REDUCTION: This exam was performed according to the departmental dose-optimization program which includes automated exposure control, adjustment of the mA and/or kV according to patient size and/or use of iterative reconstruction technique. COMPARISON:  01/26/2023 FINDINGS: CT HEAD FINDINGS Brain: No evidence of acute infarction, hemorrhage, hydrocephalus, extra-axial collection or mass lesion/mass effect. Generalized atrophy. Fairly extensive chronic small vessel ischemic low-density in the cerebral white matter and deep gray nuclei. Vascular: No hyperdense vessel or unexpected calcification. Skull: No acute finding. Small inner table osteoma at the left middle cranial fossa. Sinuses/Orbits: No evidence of injury CT CERVICAL SPINE FINDINGS Alignment: Normal. Skull base and vertebrae: No acute fracture. No primary bone lesion or focal pathologic process. Soft tissues and spinal canal: No prevertebral fluid or swelling. No visible canal hematoma. Disc levels:  Ordinary cervical spine degeneration. Upper chest: Clear apical lungs IMPRESSION: No evidence of acute intracranial or cervical spine injury. Electronically Signed   By: Tiburcio Pea M.D.   On: 02/05/2023 06:09        Scheduled Meds:  sodium chloride   Intravenous Once   amLODipine  5 mg Oral Daily   docusate sodium  100 mg Oral BID   hydrALAZINE  50 mg Oral BID   insulin aspart  0-9 Units Subcutaneous TID WC   levothyroxine  88 mcg Oral Daily   metoprolol succinate  25 mg Oral Daily   Continuous Infusions:  sodium chloride 50 mL/hr at 02/06/23 1123   sodium chloride      ceFAZolin (ANCEF) IV     methocarbamol (ROBAXIN) IV     tranexamic acid       LOS: 1 day    Time spent: 40  minutes    Ramiro Harvest, MD [facility]   To contact the attending provider between 7A-7P or the covering provider during after hours 7P-7A, please log into the web site [email or web address] and access using universal [place] password for that web site. If you do not have the password, please call the hospital operator.  02/06/2023, 5:28 PM

## 2023-02-06 NOTE — TOC CAGE-AID Note (Signed)
Transition of Care [facility]) - CAGE-AID Screening   Patient Details  Name: VIKTORYA LEIFHEIT MRN: [id number] Date of Birth: 1930-05-09  Transition of Care [facility]) CM/SW Contact:    Katha Hamming, RN Phone Number: 02/06/2023, 7:18 PM   Clinical Narrative: Chart review. Denies current alcohol and drug use   CAGE-AID Screening:    Have You Ever Felt You Ought to Cut Down on Your Drinking or Drug Use?: No Have People Annoyed You By Critizing Your Drinking Or Drug Use?: No Have You Felt Bad Or Guilty About Your Drinking Or Drug Use?: No Have You Ever Had a Drink or Used Drugs First Thing In The Morning to Steady Your Nerves or to Get Rid of a Hangover?: No CAGE-AID Score: 0  Substance Abuse Education Offered: No

## 2023-02-06 NOTE — Op Note (Signed)
Orthopaedic Surgery Operative Note (CSN: [id number] ) Date of Surgery: 02/06/2023  Admit Date: 02/05/2023   Diagnoses: Pre-Op Diagnoses: Right intertrochanteric femur fracture fracture Right proximal humerus fracture  Post-Op Diagnosis: Same  Procedures: CPT 27245-Cephalomedullary nailing of right intertrochanteric femur fracture  Surgeons : Primary: Roby Lofts, MD  Assistant: Ulyses Southward, PA-C  Location: OR 3   Anesthesia: General   Antibiotics: Ancef 2g preop   Tourniquet time: None used    Estimated Blood Loss: 25 mL  Complications:None   Specimens:None  Implants: Implant Name Type Inv. Item Serial No. Manufacturer Lot No. LRB No. Used Action  NAIL INTERTAN 10X18 130D 10S - [id number] Nail NAIL INTERTAN 10X18 130D 10S  [employer] [id number] Right 1 Implanted  SCREW LAG COMPR KIT 100/95 - [id number] Screw SCREW LAG COMPR KIT 100/95  [facility] [employer] [id number] Right 1 Implanted  SCREW TRIGEN LOW PROF 5.0X32.5 - [id number] Screw SCREW TRIGEN LOW PROF 5.0X32.5  [employer] [id number] Right 1 Implanted     Indications for Surgery: 89 year old female who sustained a right intertrochanteric femur fracture.  Due to the unstable nature of her injury I recommend proceeding with cephalomedullary nailing of the right hip.  Risks and benefits were discussed with the patient and her daughter-in-law.  Risks included but not limited to bleeding, infection, malunion, nonunion, hardware failure, hardware irritation, nerve or blood vessel injury, DVT, even the possibility anesthetic complications.  They agreed to proceed with surgery and consent was obtained.  Operative Findings: Cephalomedullary nailing of right intertrochanteric femur fracture using [employer] InterTAN 10 x 180 mm nail with a 100 mm lag screw in the 95 mm compression screw.  Procedure: The patient was identified in the preoperative holding area. Consent was  confirmed with the patient and their family and all questions were answered. The operative extremity was marked after confirmation with the patient. she was then brought back to the operating room by our anesthesia colleagues.  She was placed under general anesthetic and carefully transferred over to a Hana table.  All bony prominences were well-padded.  Traction was then applied to the right lower extremity and the fracture was reduced.  The right lower extremity was then prepped and draped in usual sterile fashion.  A timeout was performed to verify the patient, the procedure, and the extremity.  Preoperative antibiotics were dosed.  Small incision proximal to the greater trochanter was made and carried down through skin and subcutaneous tissue.  A threaded guidewire was directed at the tip of the greater trochanter and advanced into the proximal metaphysis.  Confirmed positioning of this guidewire and then used an entry reamer to enter the medullary canal.  I then passed a 10 x 180 mm nail down the center of the canal attached to the targeting arm.  I then directed a threaded guidewire through the targeting arm up into the head/neck segment.  I confirmed adequate tip apex distance using AP and lateral fluoroscopic imaging.  I then measured the length and chose to use 100 mm lag screw.  I drilled and drilled the path for the compression screw placed an antirotation bar.  I then drilled the path for the lag screw.  I then placed the lag screw.  I then placed the compression screw and compressed approximately 3 to 4 mm.  I then statically locked the proximal portion of the nail.  I then used the targeting arm to place a distal interlocking screw.  The targeting arm  was removed and final fluoroscopic imaging was obtained.  The incisions were irrigated and closed with 2-0 Monocryl and Dermabond.  Sterile dressings were applied.  The patient was then awoke from anesthesia and taken to the PACU in stable  condition.  Post Op Plan/Instructions: The patient will be weightbearing as tolerated to the right lower extremity.  She will receive postoperative Ancef.  She will receive her home dose Eliquis once her hemoglobin is stabilized.  Will have her mobilize with physical and Occupational Therapy.  I was present and performed the entire surgery.  Ulyses Southward, PA-C did assist me throughout the case. An assistant was necessary given the difficulty in approach, maintenance of reduction and ability to instrument the fracture.   Truitt Merle, MD [facility]

## 2023-02-06 NOTE — Progress Notes (Signed)
Patient returns from pacu at this time

## 2023-02-06 NOTE — Anesthesia Postprocedure Evaluation (Signed)
Anesthesia Post Note  Patient: Barbara Butler  Procedure(s) Performed: INTRAMEDULLARY NAILING OF RIGHT FEMUR (Right: Leg Upper)     Patient location during evaluation: PACU Anesthesia Type: General Level of consciousness: awake and alert Pain management: pain level controlled Vital Signs Assessment: post-procedure vital signs reviewed and stable Respiratory status: spontaneous breathing, nonlabored ventilation, respiratory function stable and patient connected to nasal cannula oxygen Cardiovascular status: blood pressure returned to baseline and stable Postop Assessment: no apparent nausea or vomiting Anesthetic complications: no  No notable events documented.  Last Vitals:  Vitals:   02/06/23 1007 02/06/23 1103  BP: 129/85 (!) 146/84  Pulse: 88 91  Resp: 16 16  Temp: (!) 36.4 C (!) 36.3 C  SpO2: 98% 99%    Last Pain:  Vitals:   02/06/23 1103  TempSrc: Oral  PainSc:                  Shelton Silvas

## 2023-02-06 NOTE — Inpatient Diabetes Management (Signed)
Inpatient Diabetes Program Recommendations  AACE/ADA: New Consensus Statement on Inpatient Glycemic Control   Target Ranges:  Prepandial:   less than 140 mg/dL      Peak postprandial:   less than 180 mg/dL (1-2 hours)      Critically ill patients:  140 - 180 mg/dL    Latest Reference Range & Units 02/05/23 12:07 02/05/23 17:06 02/05/23 20:07 02/06/23 06:31  Glucose-Capillary 70 - 99 mg/dL 409 (H) 811 (H) 914 (H) 278 (H)    Review of Glycemic Control  Diabetes history: DM2 Outpatient Diabetes medications: Metformin 500 mg BID Current orders for Inpatient glycemic control: Novolog 0-9 units TID with meals  Inpatient Diabetes Program Recommendations:    Insulin: Please consider ordering Semglee 5 units Q24H.  Thanks, Orlando Penner, RN, MSN, CDCES Diabetes Coordinator Inpatient Diabetes Program [phone] (Team Pager from 8am to 5pm)

## 2023-02-06 NOTE — Progress Notes (Signed)
PHARMACY NOTE:  ANTIMICROBIAL RENAL DOSAGE ADJUSTMENT  Current antimicrobial regimen includes a mismatch between antimicrobial dosage and estimated renal function.  As per policy approved by the Pharmacy & Therapeutics and Medical Executive Committees, the antimicrobial dosage will be adjusted accordingly.  Current antimicrobial dosage:  Cefazolin 2gm IV q8h x 3 doses  Indication: surgical prophylaxis  Renal Function:  Estimated Creatinine Clearance: 26.1 mL/min (A) (by C-G formula based on SCr of 1.11 mg/dL (H)). []      On intermittent HD, scheduled: []      On CRRT    Antimicrobial dosage has been changed to:  Cefazolin 2gm IV q12h x 2 doses    Thank you for allowing pharmacy to be a part of this patient's care.  Dennie Fetters, [facility] 02/06/2023 10:17 AM

## 2023-02-06 NOTE — Transfer of Care (Signed)
Immediate Anesthesia Transfer of Care Note  Patient: Barbara Butler  Procedure(s) Performed: INTRAMEDULLARY NAILING OF RIGHT FEMUR (Right: Leg Upper)  Patient Location: PACU  Anesthesia Type:General  Level of Consciousness: drowsy  Airway & Oxygen Therapy: Patient Spontanous Breathing and Patient connected to nasal cannula oxygen  Post-op Assessment: Report given to RN and Post -op Vital signs reviewed and stable  Post vital signs: Reviewed and stable  Last Vitals:  Vitals Value Taken Time  BP 120/81 02/06/23 0900  Temp    Pulse 93 02/06/23 0856  Resp 19 02/06/23 0900  SpO2 100 % 02/06/23 0856  Vitals shown include unfiled device data.  Last Pain:  Vitals:   02/06/23 0705  TempSrc:   PainSc: 0-No pain         Complications: No notable events documented.

## 2023-02-07 ENCOUNTER — Encounter (HOSPITAL_COMMUNITY): Payer: Self-pay | Admitting: Student

## 2023-02-07 DIAGNOSIS — S72001A Fracture of unspecified part of neck of right femur, initial encounter for closed fracture: Secondary | ICD-10-CM | POA: Diagnosis not present

## 2023-02-07 DIAGNOSIS — D649 Anemia, unspecified: Secondary | ICD-10-CM

## 2023-02-07 DIAGNOSIS — S42214D Unspecified nondisplaced fracture of surgical neck of right humerus, subsequent encounter for fracture with routine healing: Secondary | ICD-10-CM | POA: Diagnosis not present

## 2023-02-07 DIAGNOSIS — I6310 Cerebral infarction due to embolism of unspecified precerebral artery: Secondary | ICD-10-CM | POA: Diagnosis not present

## 2023-02-07 DIAGNOSIS — I4891 Unspecified atrial fibrillation: Secondary | ICD-10-CM | POA: Diagnosis not present

## 2023-02-07 LAB — GLUCOSE, CAPILLARY
Glucose-Capillary: 192 mg/dL — ABNORMAL HIGH (ref 70–99)
Glucose-Capillary: 217 mg/dL — ABNORMAL HIGH (ref 70–99)
Glucose-Capillary: 230 mg/dL — ABNORMAL HIGH (ref 70–99)
Glucose-Capillary: 236 mg/dL — ABNORMAL HIGH (ref 70–99)

## 2023-02-07 MED ORDER — OXYCODONE HCL 5 MG PO TABS
5.0000 mg | ORAL_TABLET | ORAL | Status: DC | PRN
  Administered 2023-02-08 – 2023-02-10 (×5): 5 mg via ORAL
  Filled 2023-02-07 (×5): qty 1

## 2023-02-07 MED ORDER — ENSURE ENLIVE PO LIQD
237.0000 mL | ORAL | Status: DC
  Administered 2023-02-07 – 2023-02-10 (×4): 237 mL via ORAL

## 2023-02-07 MED ORDER — VITAMIN D 25 MCG (1000 UNIT) PO TABS
1000.0000 [IU] | ORAL_TABLET | Freq: Every day | ORAL | Status: DC
  Administered 2023-02-07 – 2023-02-11 (×5): 1000 [IU] via ORAL
  Filled 2023-02-07 (×5): qty 1

## 2023-02-07 MED ORDER — INSULIN GLARGINE-YFGN 100 UNIT/ML ~~LOC~~ SOLN
5.0000 [IU] | Freq: Every day | SUBCUTANEOUS | Status: DC
  Administered 2023-02-07: 5 [IU] via SUBCUTANEOUS
  Filled 2023-02-07 (×2): qty 0.05

## 2023-02-07 NOTE — NC FL2 (Signed)
[place] MEDICAID FL2 LEVEL OF CARE FORM     IDENTIFICATION  Patient Name: Barbara Butler Birthdate: May 22, 1930 Sex: female Admission Date (Current Location): 02/05/2023  [facility] and [place] Number:  Producer, television/film/video and Address:  [facility], [address] [place], [place] [address]      Provider Number: [id number]  Attending Physician Name and Address:  Rodolph Bong, MD  Relative Name and Phone Number:  [name] [phone]    Current Level of Care: Hospital Recommended Level of Care: Skilled Nursing Facility Prior Approval Number:    Date Approved/Denied:   PASRR Number: [id number]  Discharge Plan: SNF    Current Diagnoses: Patient Active Problem List   Diagnosis Date Noted   Fall 02/06/2023   Fall at nursing home, initial encounter 02/05/2023   Closed right hip fracture, initial encounter (HCC) 02/05/2023   Humeral surgical neck fracture 02/05/2023   DNR (do not resuscitate) 02/05/2023   Atrial flutter (HCC) 01/28/2023   Atrial fibrillation/flutter (HCC) 01/28/2023   Cerebrovascular accident (CVA) (HCC) 01/27/2023   Hypertension 01/26/2023   Hypothyroidism 01/26/2023   Confusion and disorientation / subacute cva. 01/26/2023   Type 2 diabetes mellitus with hyperglycemia, without long-term current use of insulin (HCC) 08/31/2019    Orientation RESPIRATION BLADDER Height & Weight     Time, Situation, Place, Self  Normal Continent Weight: 55 kg Height:  5\' 2"  (157.5 cm)  BEHAVIORAL SYMPTOMS/MOOD NEUROLOGICAL BOWEL NUTRITION STATUS      Continent  (See discharge summary)  AMBULATORY STATUS COMMUNICATION OF NEEDS Skin   Total Care Verbally Surgical wounds (Right hip/thigh dressing post-surgery)                       Personal Care Assistance Level of Assistance  Bathing, Feeding, Dressing, Total care Bathing Assistance: Limited assistance Feeding assistance: Limited assistance Dressing Assistance: Maximum  assistance     Functional Limitations Info  Sight, Hearing, Speech Sight Info: Adequate Hearing Info: Adequate Speech Info: Adequate    SPECIAL CARE FACTORS FREQUENCY  PT (By licensed PT), OT (By licensed OT)     PT Frequency: 5x per week OT Frequency: 5 x per week            Contractures Contractures Info: Not present    Additional Factors Info  Code Status, Allergies, Insulin Sliding Scale Code Status Info: Full code Allergies Info: NKDA   Insulin Sliding Scale Info: Novolog sensitive sliding scale       Current Medications (02/07/2023):  This is the current hospital active medication list Current Facility-Administered Medications  Medication Dose Route Frequency Provider Last Rate Last Admin   0.9 %  sodium chloride infusion (Manually program via Guardrails IV Fluids)   Intravenous Once Thyra Breed A, PA-C       0.9 %  sodium chloride infusion   Intravenous Continuous West Bali, PA-C 50 mL/hr at 02/07/23 0531 New Bag at 02/07/23 0531   acetaminophen (TYLENOL) tablet 325-650 mg  325-650 mg Oral Q6H PRN West Bali, PA-C   325 mg at 02/06/23 2157   amLODipine (NORVASC) tablet 5 mg  5 mg Oral Daily West Bali, PA-C   5 mg at 02/07/23 0850   bisacodyl (DULCOLAX) EC tablet 5 mg  5 mg Oral Daily PRN West Bali, PA-C       cholecalciferol (VITAMIN D3) 25 MCG (1000 UNIT) tablet 1,000 Units  1,000 Units Oral Daily West Bali, PA-C  docusate sodium (COLACE) capsule 100 mg  100 mg Oral BID West Bali, PA-C   100 mg at 02/07/23 0850   feeding supplement (ENSURE ENLIVE / ENSURE PLUS) liquid 237 mL  237 mL Oral Q24H Rodolph Bong, MD       hydrALAZINE (APRESOLINE) tablet 50 mg  50 mg Oral BID West Bali, PA-C   50 mg at 02/07/23 0850   insulin aspart (novoLOG) injection 0-9 Units  0-9 Units Subcutaneous TID WC West Bali, PA-C   3 Units at 02/07/23 1315   insulin glargine-yfgn (SEMGLEE) injection 5 Units  5 Units Subcutaneous  Daily Ulyses Southward Q, RPH-CPP   5 Units at 02/07/23 1109   levothyroxine (SYNTHROID) tablet 88 mcg  88 mcg Oral Daily West Bali, PA-C   88 mcg at 02/07/23 0532   methocarbamol (ROBAXIN) tablet 500 mg  500 mg Oral Q6H PRN West Bali, PA-C       Or   methocarbamol (ROBAXIN) 500 mg in dextrose 5 % 50 mL IVPB  500 mg Intravenous Q6H PRN Sharon Seller, Sarah A, PA-C       metoCLOPramide (REGLAN) tablet 5-10 mg  5-10 mg Oral Q8H PRN Sharon Seller, Sarah A, PA-C       Or   metoCLOPramide (REGLAN) injection 5-10 mg  5-10 mg Intravenous Q8H PRN West Bali, PA-C       metoprolol succinate (TOPROL-XL) 24 hr tablet 25 mg  25 mg Oral Daily West Bali, PA-C   25 mg at 02/07/23 0850   morphine (PF) 2 MG/ML injection 2 mg  2 mg Intravenous Q2H PRN West Bali, PA-C       ondansetron (ZOFRAN) tablet 4 mg  4 mg Oral Q6H PRN West Bali, PA-C       Or   ondansetron (ZOFRAN) injection 4 mg  4 mg Intravenous Q6H PRN McClung, Sarah A, PA-C       oxyCODONE (Oxy IR/ROXICODONE) immediate release tablet 5 mg  5 mg Oral Q4H PRN McClung, Sarah A, PA-C       polyethylene glycol (MIRALAX / GLYCOLAX) packet 17 g  17 g Oral Daily PRN West Bali, PA-C         Discharge Medications: Please see discharge summary for a list of discharge medications.  Relevant Imaging Results:  Relevant Lab Results:   Additional Information SSN#[id number]  Janae Bridgeman, RN

## 2023-02-07 NOTE — Evaluation (Signed)
Physical Therapy Evaluation Patient Details Name: Barbara Butler MRN: [id number] DOB: 1929/10/17 Today's Date: 02/07/2023  History of Present Illness  89 y/o female adm 7/31 with fall at [facility] with Rt femur fx and Rt humerus fx. 8/1 Rt femur IM nail. PMHx: [facility] admission 7/21-24/24 for bil subacute infarcts in occipital lobes and new AFib. HTN, T2DM, anemia, CAD  Clinical Impression  Pt very pleasant and reports performing well with rehab at ST-SNF, walking and progressing. Pt reports no significant pain at rest with 5/10 RLe with movement. PT able to transfer to chair with mod assist and would benefit from +2 assist to progress mobility and stepping given NWB RUE. Pt with decreased strength, function, mobility and activity tolerance who will benefit from acute therapy to maximize mobility, safety and independence.         If plan is discharge home, recommend the following: Assistance with cooking/housework;Direct supervision/assist for medications management;Direct supervision/assist for financial management;Assist for transportation;Help with stairs or ramp for entrance;A lot of help with walking and/or transfers;A lot of help with bathing/dressing/bathroom   Can travel by private vehicle   No    Equipment Recommendations Wheelchair (measurements PT);Wheelchair cushion (measurements PT)  Recommendations for Other Services       Functional Status Assessment Patient has had a recent decline in their functional status and demonstrates the ability to make significant improvements in function in a reasonable and predictable amount of time.     Precautions / Restrictions Precautions Precautions: Fall;Other (comment) Required Braces or Orthoses: Sling Restrictions RUE Weight Bearing: Non weight bearing RLE Weight Bearing: Weight bearing as tolerated      Mobility  Bed Mobility Overal bed mobility: Needs Assistance Bed Mobility: Supine to Sit     Supine to sit: Max  assist, HOB elevated     General bed mobility comments: max assist to pivot LB to EOB with pt utilizing rail with LUE to elevate trunk. mod to fully scoot to EOB    Transfers Overall transfer level: Needs assistance   Transfers: Sit to/from Stand, Bed to chair/wheelchair/BSC Sit to Stand: Mod assist Stand pivot transfers: Mod assist         General transfer comment: mod assist to stand from bed with pad cradling sacrum x 2 trials and pivot bed to recliner toward left    Ambulation/Gait               General Gait Details: not currently able  Stairs            Wheelchair Mobility     Tilt Bed    Modified Rankin (Stroke Patients Only)       Balance Overall balance assessment: Needs assistance, History of Falls Sitting-balance support: No upper extremity supported, Feet supported Sitting balance-Leahy Scale: Fair     Standing balance support: Single extremity supported Standing balance-Leahy Scale: Poor Standing balance comment: LUE support and assist at pelvis for standing                             Pertinent Vitals/Pain Pain Assessment Pain Assessment: 0-10 Pain Score: 5  Pain Location: RLE Pain Descriptors / Indicators: Aching Pain Intervention(s): Limited activity within patient's tolerance, Monitored during session, Repositioned (pt denied pain meds)    Home Living Family/patient expects to be discharged to:: Skilled nursing facility Living Arrangements: Children Available Help at Discharge: Family;Available PRN/intermittently Type of Home: House Home Access: Stairs to enter Entrance Stairs-Rails: Right;Left;Can  reach both Entrance Stairs-Number of Steps: 4   Home Layout: One level Home Equipment: None      Prior Function Prior Level of Function : Independent/Modified Independent;Driving             Mobility Comments: RW and assist since CVA       Hand Dominance        Extremity/Trunk Assessment   Upper  Extremity Assessment Upper Extremity Assessment: Defer to OT evaluation;RUE deficits/detail RUE Deficits / Details: sling, NWB, acute fx    Lower Extremity Assessment Lower Extremity Assessment: RLE deficits/detail RLE Deficits / Details: pt needing assist for all movement of RLE due to post op pain with limited ROM    Cervical / Trunk Assessment Cervical / Trunk Assessment: Normal  Communication   Communication: No difficulties  Cognition Arousal/Alertness: Awake/alert Behavior During Therapy: WFL for tasks assessed/performed Overall Cognitive Status: Impaired/Different from baseline                               Problem Solving: Slow processing General Comments: slow processing needing repetition for cues and questions at times        General Comments      Exercises General Exercises - Lower Extremity Long Arc Quad: AAROM, Right, Seated, 5 reps Heel Slides: AAROM, Right, Supine, 5 reps   Assessment/Plan    PT Assessment Patient needs continued PT services  PT Problem List Decreased strength;Decreased activity tolerance;Decreased balance;Decreased mobility;Decreased cognition;Decreased knowledge of use of DME;Decreased safety awareness;Decreased knowledge of precautions;Cardiopulmonary status limiting activity;Decreased range of motion;Pain       PT Treatment Interventions DME instruction;Gait training;Functional mobility training;Therapeutic activities;Stair training;Therapeutic exercise;Balance training;Patient/family education    PT Goals (Current goals can be found in the Care Plan section)  Acute Rehab PT Goals Patient Stated Goal: be able to walk PT Goal Formulation: With patient Time For Goal Achievement: 02/21/23 Potential to Achieve Goals: Fair    Frequency Min 1X/week     Co-evaluation               AM-PAC PT "6 Clicks" Mobility  Outcome Measure Help needed turning from your back to your side while in a flat bed without using  bedrails?: A Lot Help needed moving from lying on your back to sitting on the side of a flat bed without using bedrails?: A Lot Help needed moving to and from a bed to a chair (including a wheelchair)?: A Lot Help needed standing up from a chair using your arms (e.g., wheelchair or bedside chair)?: A Lot Help needed to walk in hospital room?: Total Help needed climbing 3-5 steps with a railing? : Total 6 Click Score: 10    End of Session   Activity Tolerance: Patient tolerated treatment well Patient left: in chair;with call bell/phone within reach;with chair alarm set Nurse Communication: Mobility status;Weight bearing status (stand pivot with pad and +2 assist) PT Visit Diagnosis: Unsteadiness on feet (R26.81);Muscle weakness (generalized) (M62.81);Other abnormalities of gait and mobility (R26.89);History of falling (Z91.81);Pain Pain - Right/Left: Right Pain - part of body: Hip    Time: 0717-0745 PT Time Calculation (min) (ACUTE ONLY): 28 min   Charges:   PT Evaluation $PT Eval Moderate Complexity: 1 Mod PT Treatments $Therapeutic Activity: 8-22 mins PT General Charges $$ ACUTE PT VISIT: 1 Visit         Merryl Hacker, PT Acute Rehabilitation Services Office: [phone]   Enedina Finner  02/07/2023, 8:58 AM

## 2023-02-07 NOTE — TOC Initial Note (Signed)
Transition of Care [facility]) - Initial/Assessment Note    Patient Details  Name: Barbara Butler MRN: [id number] Date of Birth: June 21, 1930  Transition of Care [facility]) CM/SW Contact:    Janae Bridgeman, RN Phone Number: 02/07/2023, 3:10 PM  Clinical Narrative:                 CM met with the patient at the bedside to discuss TOC needs.  The patient was admitted to [facility] from [facility] where she had fallen at the facility and fx Right humerus and Right femur.  The patient is presently in Right arm immobilizer sling and right hip dressing noted post surgery - clean dry and intact with no drains.  The patient is alert and oriented and plans to return to [facility] for short term rehab once she is medically stable to discharge.  FL2 completed and patient was faxed out to [facility].  The patient will need insurance authorization started once medically stable to discharge and patient should return to the facility by [facility] when stable.  Expected Discharge Plan: Skilled Nursing Facility Barriers to Discharge: Continued Medical Work up   Patient Goals and CMS Choice Patient states their goals for this hospitalization and ongoing recovery are:: To return to [employer] CMS Medicare.gov Compare Post Acute Care list provided to:: Patient Choice offered to / list presented to : Patient [place] ownership interest in [facility].provided to:: Patient    Expected Discharge Plan and Services   Discharge Planning Services: CM Consult Post Acute Care Choice: Skilled Nursing Facility Living arrangements for the past 2 months: Single Family Home                                      Prior Living Arrangements/Services Living arrangements for the past 2 months: Single Family Home Lives with:: Self Patient language and need for interpreter reviewed:: Yes Do you feel safe going back to the place where you live?: Yes      Need for Family  Participation in Patient Care: Yes (Comment) Care giver support system in place?: Yes (comment) Current home services: DME (Cane, Glucometer) Criminal Activity/Legal Involvement Pertinent to Current Situation/Hospitalization: No - Comment as needed  Activities of Daily Living Home Assistive Devices/Equipment: Environmental consultant (specify type) ADL Screening (condition at time of admission) Patient's cognitive ability adequate to safely complete daily activities?: Yes Is the patient deaf or have difficulty hearing?: Yes Does the patient have difficulty seeing, even when wearing glasses/contacts?: No Does the patient have difficulty concentrating, remembering, or making decisions?: No Patient able to express need for assistance with ADLs?: Yes Does the patient have difficulty dressing or bathing?: Yes Independently performs ADLs?: No Communication: Independent Dressing (OT): Needs assistance Is this a change from baseline?: Pre-admission baseline Grooming: Needs assistance Is this a change from baseline?: Pre-admission baseline Feeding: Independent Bathing: Needs assistance Is this a change from baseline?: Pre-admission baseline Toileting: Needs assistance Is this a change from baseline?: Pre-admission baseline In/Out Bed: Needs assistance Is this a change from baseline?: Pre-admission baseline Walks in Home: Needs assistance Is this a change from baseline?: Pre-admission baseline Does the patient have difficulty walking or climbing stairs?: Yes Weakness of Legs: Both Weakness of Arms/Hands: Right  Permission Sought/Granted Permission sought to share information with : Case Manager, Family Supports, Magazine features editor       Permission granted to share  info w AGENCY: [facility]        Emotional Assessment Appearance:: Appears stated age Attitude/Demeanor/Rapport: Gracious Affect (typically observed): Accepting Orientation: : Oriented to Self, Oriented to Place,  Oriented to  Time, Oriented to Situation Alcohol / Substance Use: Not Applicable Psych Involvement: No (comment)  Admission diagnosis:  Fall, initial encounter [W19.XXXA] Closed right hip fracture, initial encounter (HCC) [S72.001A] Fall at nursing home, initial encounter [W19.Lorne Skeens, Y92.129] Other closed displaced fracture of proximal end of right humerus, initial encounter [S42.291A] Patient Active Problem List   Diagnosis Date Noted   Fall 02/06/2023   Fall at nursing home, initial encounter 02/05/2023   Closed right hip fracture, initial encounter (HCC) 02/05/2023   Humeral surgical neck fracture 02/05/2023   DNR (do not resuscitate) 02/05/2023   Atrial flutter (HCC) 01/28/2023   Atrial fibrillation/flutter (HCC) 01/28/2023   Cerebrovascular accident (CVA) (HCC) 01/27/2023   Hypertension 01/26/2023   Hypothyroidism 01/26/2023   Confusion and disorientation / subacute cva. 01/26/2023   Type 2 diabetes mellitus with hyperglycemia, without long-term current use of insulin (HCC) 08/31/2019   PCP:  [name], MD Pharmacy:   [facility] [address]), [place] - [address]) [place] [address] Phone: [phone] Fax: [phone]     Social Determinants of Health (SDOH) Social History: SDOH Screenings   Food Insecurity: No Food Insecurity (02/05/2023)  Housing: Low Risk  (02/05/2023)  Transportation Needs: No Transportation Needs (02/05/2023)  Utilities: Not At Risk (02/05/2023)  Tobacco Use: Medium Risk (02/05/2023)   SDOH Interventions:     Readmission Risk Interventions    02/07/2023    3:03 PM  Readmission Risk Prevention Plan  Transportation Screening Complete  PCP or Specialist Appt within 5-7 Days Complete  Home Care Screening Complete  Medication Review (RN CM) Complete

## 2023-02-07 NOTE — Progress Notes (Addendum)
Orthopaedic Trauma Progress Note  SUBJECTIVE: Doing okay this morning.  Pain currently controlled.  Has not required any narcotics postoperatively, received Tylenol yesterday evening.  Did have some increased when up and mobilize into the bedside chair but this is easing off now.  No chest pain. No SOB. No nausea/vomiting. No other complaints.  Tolerating diet and fluids.  OBJECTIVE:  Vitals:   02/07/23 0752 02/07/23 0755  BP: (!) 140/85   Pulse: 91 (!) 109  Resp:    Temp: 98 F (36.7 C)   SpO2: 98%     General: Sitting up in bedside chair, eating breakfast.  No acute distress Respiratory: No increased work of breathing.  Right lower extremity: Dressings clean, dry, intact.  Tenderness over the hip as expected.  Tolerates ankle DF/PF.  Endorses sensation of light touch distally. + DP pulse Right upper extremity: Sling in place. Mildly tender about shoulder. Tolerates gentle elbow and wrist motion. Shoulder motion not tested. Endorses sensation throughout extremity. Motor function intact through median, ulnar, radial nerve distribution. +radial pulse  IMAGING: Stable post op imaging.   LABS:  Results for orders placed or performed during the hospital encounter of 02/05/23 (from the past 24 hour(s))  Glucose, capillary     Status: Abnormal   Collection Time: 02/06/23  8:57 AM  Result Value Ref Range   Glucose-Capillary 231 (H) 70 - 99 mg/dL  CBC with Differential/Platelet     Status: Abnormal   Collection Time: 02/06/23 12:35 PM  Result Value Ref Range   WBC 14.3 (H) 4.0 - 10.5 K/uL   RBC 3.84 (L) 3.87 - 5.11 MIL/uL   Hemoglobin 11.5 (L) 12.0 - 15.0 g/dL   HCT 64.4 (L) 03.4 - 74.2 %   MCV 86.2 80.0 - 100.0 fL   MCH 29.9 26.0 - 34.0 pg   MCHC 34.7 30.0 - 36.0 g/dL   RDW 59.5 63.8 - 75.6 %   Platelets 133 (L) 150 - 400 K/uL   nRBC 1.0 (H) 0.0 - 0.2 %   Neutrophils Relative % 87 %   Neutro Abs 12.5 (H) 1.7 - 7.7 K/uL   Lymphocytes Relative 6 %   Lymphs Abs 0.9 0.7 - 4.0 K/uL    Monocytes Relative 6 %   Monocytes Absolute 0.9 0.1 - 1.0 K/uL   Eosinophils Relative 0 %   Eosinophils Absolute 0.0 0.0 - 0.5 K/uL   Basophils Relative 0 %   Basophils Absolute 0.0 0.0 - 0.1 K/uL   Immature Granulocytes 1 %   Abs Immature Granulocytes 0.10 (H) 0.00 - 0.07 K/uL  Magnesium     Status: None   Collection Time: 02/06/23 12:35 PM  Result Value Ref Range   Magnesium 1.9 1.7 - 2.4 mg/dL  VITAMIN D 25 Hydroxy (Vit-D Deficiency, Fractures)     Status: None   Collection Time: 02/06/23 12:35 PM  Result Value Ref Range   Vit D, 25-Hydroxy 36.09 30 - 100 ng/mL  Glucose, capillary     Status: Abnormal   Collection Time: 02/06/23 12:41 PM  Result Value Ref Range   Glucose-Capillary 246 (H) 70 - 99 mg/dL  Glucose, capillary     Status: Abnormal   Collection Time: 02/06/23  6:02 PM  Result Value Ref Range   Glucose-Capillary 228 (H) 70 - 99 mg/dL  Glucose, capillary     Status: Abnormal   Collection Time: 02/06/23 10:13 PM  Result Value Ref Range   Glucose-Capillary 176 (H) 70 - 99 mg/dL  CBC  Status: Abnormal   Collection Time: 02/07/23  4:07 AM  Result Value Ref Range   WBC 10.3 4.0 - 10.5 K/uL   RBC 3.01 (L) 3.87 - 5.11 MIL/uL   Hemoglobin 8.8 (L) 12.0 - 15.0 g/dL   HCT 16.1 (L) 09.6 - 04.5 %   MCV 84.7 80.0 - 100.0 fL   MCH 29.2 26.0 - 34.0 pg   MCHC 34.5 30.0 - 36.0 g/dL   RDW 40.9 (H) 81.1 - 91.4 %   Platelets 121 (L) 150 - 400 K/uL   nRBC 2.6 (H) 0.0 - 0.2 %  Basic metabolic panel     Status: Abnormal   Collection Time: 02/07/23  4:07 AM  Result Value Ref Range   Sodium 133 (L) 135 - 145 mmol/L   Potassium 4.1 3.5 - 5.1 mmol/L   Chloride 104 98 - 111 mmol/L   CO2 21 (L) 22 - 32 mmol/L   Glucose, Bld 195 (H) 70 - 99 mg/dL   BUN 35 (H) 8 - 23 mg/dL   Creatinine, Ser 7.82 (H) 0.44 - 1.00 mg/dL   Calcium 8.0 (L) 8.9 - 10.3 mg/dL   GFR, Estimated 51 (L) >60 mL/min   Anion gap 8 5 - 15  Glucose, capillary     Status: Abnormal   Collection Time: 02/07/23   7:53 AM  Result Value Ref Range   Glucose-Capillary 192 (H) 70 - 99 mg/dL    ASSESSMENT: Barbara Butler is a 89 y.o. female, 1 Day Post-Op s/p INTRAMEDULLARY NAILING OF RIGHT FEMUR  CV/Blood loss: Acute blood loss anemia, Hgb 8.8 this morning. Hemodynamically stable  PLAN: Weightbearing: WBAT RLE. NWB RUE ROM: Unrestricted ROM RLE. Maintain sling RUE. Ok for gentle elbow/wrist motion Incisional and dressing care: Reinforce dressings as needed  Showering: Okay to begin showering getting incisions wet 02/09/2023 Orthopedic device(s): None  Pain management: Continue current multimodal regimen 1. Tylenol 650 mg q 6 hours scheduled 2. Robaxin 500 mg q 6 hours PRN 3. Oxycodone 5-10 mg q 4 hours PRN 4. Dilaudid 1 mg q 3 hours PRN VTE prophylaxis:  Hold restarting home dose Eliquis 1 additional day until hemoglobin stabilizes.  SCDs ID:  Ancef 2gm post op Foley/Lines:  No foley, KVO IVFs Impediments to Fracture Healing: Vitamin D level low normal at 36, started on daily supplementation Dispo: PT/OT evaluation today, dispo pending.  Patient will likely require SNF.  Continue to monitor CBC and restart home dose Eliquis once hemoglobin stable.  Okay for discharge from ortho standpoint once cleared by medicine team and therapies  D/C recommendations: -Tylenol for pain control - home dose Eliquis for DVT prophylaxis -Continue 1000 units daily Vit D supplementation  Follow - up plan: 2 weeks   Contact information:  Truitt Merle MD, Thyra Breed PA-C. After hours and holidays please check [email or web address] for group call information for [facility]   Thompson Caul, PA-C [phone] (office) [email or web address]

## 2023-02-07 NOTE — Progress Notes (Signed)
PROGRESS NOTE    Barbara Butler  MVH:[id number] DOB: May 24, 1930 DOA: 02/05/2023 PCP: [name], MD    Chief Complaint  Patient presents with   Fall    Patient to ED via EMS with complaint of mechanical fall around 1900 last night. Patient takes eliquis. Patient unable to ambulate after fall. Patient independent ADL at baseline. EMS reports patient states right shoulder pain and right hip pain. EMS reports right leg shortening and rotation.    Brief Narrative:  Patient is a 89 year old female history of diabetes, hypertension, hypothyroidism, recent CVA, A-fib on Eliquis presented with a fall.  Patient noted to have recently been hospitalized 7/21-22 for subacute CVA and discharged to [facility].  Patient noted to have had an accident where she fell hurting her right shoulder and right lower extremity.  Patient seen in the ED, imaging done concerning for right femur fracture and right humeral neck fracture.  Orthopedics consulted.   Assessment & Plan:   Principal Problem:   Closed right hip fracture, initial encounter [facility]) Active Problems:   Type 2 diabetes mellitus with hyperglycemia, without long-term current use of insulin (HCC)   Hypertension   Hypothyroidism   Cerebrovascular accident (CVA) (HCC)   Atrial fibrillation/flutter (HCC)   Fall at nursing home, initial encounter   Humeral surgical neck fracture   DNR (do not resuscitate)   Fall  #1 closed right hip fracture -Secondary to mechanical fall at SNF resulting in right hip fracture. -Patient seen in consultation by orthopedics and patient subsequently underwent cephalomedullary nailing of right intertrochanteric femur fracture, 02/06/2023 per orthopedics. -Continue current pain regimen of Tylenol, Robaxin, oxycodone, morphine as needed. -PT/OT. -Will need outpatient follow-up with orthopedics. -Per orthopedics  2.  Right humerus fracture -Patient noted with a right surgical neck humerus fracture, secondary  to mechanical fall. -Patient seen in consult by orthopedics who are recommending conservative management 6, nonoperative management at this time with sling and NWB. -Outpatient follow-up.  3.  AKI -Patient appears to have CKD stage IIIa. -Stable.  Follow.  4.  Prolonged QTc -Avoid QT prolonging medications such as PPI, nausea medications, SSRIs. -Repeat EKG in the AM.  5.  Recent CVA -Patient hospitalized recently earlier on this month for CVA. -Patient with history of new onset A-fib and also at increased risk for stroke recurrence without anticoagulation. -Consider early resumption of Eliquis postop.  6.  Atrial fibrillation -Continue Toprol-XL for rate control. -Eliquis on hold. -Per orthopedics once hemoglobin stabilizes Eliquis may be resumed.    7.  Hypertension -BP currently controlled on Toprol-XL, amlodipine, hydralazine.   -Continue to hold ARB.    8.  Diabetes mellitus type 2 -Hemoglobin A1c 7.9 (01/27/2023) -CBG 192 this morning. -Continue to hold home regimen oral hypoglycemic agents.  -Start Semglee 5 units daily. -Continue SSI.  9.  Anemia, NOS -Status post transfusion 2 units PRBCs. -Hemoglobin currently at 8.8 from as low as 7.1. -Follow H&H. -Transfusion threshold hemoglobin < 7.   DVT prophylaxis: Postop DVT prophylaxis per orthopedics. Code Status: DNR Family Communication: Updated patient.  No family at bedside. Disposition: Likely back to SNF when medically stable.  Status is: Inpatient Remains inpatient appropriate because: Severity of illness   Consultants:  Orthopedics: Dr. Jena Gauss 02/06/2023  Procedures:  CT head CT C-spine 02/05/2023 CT chest abdomen and pelvis 02/05/2023 Cephalomedullary nailing of right intratrochanteric femur fracture per orthopedics: Dr. Jena Gauss 02/06/2023 2 units PRBCs transfused 02/06/2023  Antimicrobials:  Anti-infectives (From admission, onward)    Start  Dose/Rate Route Frequency Ordered Stop   02/06/23 2000   ceFAZolin (ANCEF) IVPB 2g/100 mL premix        2 g 200 mL/hr over 30 Minutes Intravenous Every 12 hours 02/06/23 1008 02/07/23 0920   02/06/23 0817  vancomycin (VANCOCIN) powder  Status:  Discontinued          As needed 02/06/23 0817 02/06/23 0845   02/06/23 0700  ceFAZolin (ANCEF) IVPB 2g/100 mL premix        2 g 200 mL/hr over 30 Minutes Intravenous On call to O.R. 02/06/23 7829 02/06/23 0823   02/06/23 0626  ceFAZolin (ANCEF) 2-4 GM/100ML-% IVPB       Note to Pharmacy: Gleason, Ginger E: cabinet override      02/06/23 0626 02/07/23 0920         Subjective: Sitting up in chair.  Complains of some right hip discomfort however pain significantly improved from admission postoperatively.  Denies any bleeding.  No chest pain.  No shortness of breath.  Tolerating current diet.    Objective: Vitals:   02/06/23 1945 02/07/23 0443 02/07/23 0752 02/07/23 0755  BP: 134/85 118/75 (!) 140/85   Pulse: (!) 104 81 91 (!) 109  Resp: 16     Temp: 98.3 F (36.8 C) 98.2 F (36.8 C) 98 F (36.7 C)   TempSrc: Oral Oral    SpO2: 99% 97% 98%   Weight:      Height:        Intake/Output Summary (Last 24 hours) at 02/07/2023 1242 Last data filed at 02/07/2023 0906 Gross per 24 hour  Intake 769.77 ml  Output --  Net 769.77 ml   Filed Weights   02/05/23 0527 02/06/23 0655  Weight: 55 kg 55 kg    Examination:  General exam: NAD. Respiratory system: CTAB anterior lung fields.  No wheezes, no crackles, no rhonchi.  Fair air movement.  Speaking in full sentences.   Cardiovascular system: Regular rate rhythm no murmurs rubs or gallops.  No JVD.  No lower extremity edema.   Gastrointestinal system: Abdomen is soft, nontender, nondistended, positive bowel sounds.  No rebound.  No guarding.  Central nervous system: Alert and oriented. No focal neurological deficits. Extremities: Postop dressing intact on right hip.  Some tenderness to palpation on right hip.  Right upper extremity in sling.   Skin:  No rashes, lesions or ulcers Psychiatry: Judgement and insight appear normal. Mood & affect appropriate.     Data Reviewed: I have personally reviewed following labs and imaging studies  CBC: Recent Labs  Lab 02/05/23 0532 02/05/23 0543 02/05/23 2335 02/06/23 1235 02/07/23 0407  WBC 11.3*  --  11.2* 14.3* 10.3  NEUTROABS 10.0*  --   --  12.5*  --   HGB 8.8* 8.8* 7.1* 11.5* 8.8*  HCT 27.5* 26.0* 21.3* 33.1* 25.5*  MCV 88.1  --  87.3 86.2 84.7  PLT 215  --  156 133* 121*    Basic Metabolic Panel: Recent Labs  Lab 02/05/23 0531 02/05/23 0532 02/05/23 0543 02/05/23 1809 02/05/23 2335 02/06/23 1235 02/07/23 0407  NA 136 136 136 135 135  --  133*  K 4.2 4.2 4.3 3.9 4.6  --  4.1  CL 103 102  --  104 102  --  104  CO2  --  16*  --  20* 19*  --  21*  GLUCOSE 323* 323*  --  176* 256*  --  195*  BUN 26* 27*  --  31* 33*  --  35*  CREATININE 1.00 1.26*  --  1.18* 1.11*  --  1.03*  CALCIUM  --  8.9  --  8.7* 8.7*  --  8.0*  MG  --   --   --   --   --  1.9  --     GFR: Estimated Creatinine Clearance: 28.1 mL/min (A) (by C-G formula based on SCr of 1.03 mg/dL (H)).  Liver Function Tests: Recent Labs  Lab 02/05/23 0532  AST 36  ALT 31  ALKPHOS 43  BILITOT 0.8  PROT 5.4*  ALBUMIN 3.1*    CBG: Recent Labs  Lab 02/06/23 1241 02/06/23 1802 02/06/23 2213 02/07/23 0753 02/07/23 1153  GLUCAP 246* 228* 176* 192* 217*     No results found for this or any previous visit (from the past 240 hour(s)).       Radiology Studies: DG HIP PORT UNILAT W OR W/O PELVIS 1V RIGHT  Result Date: 02/06/2023 CLINICAL DATA:  Intramedullary nail fixation of the proximal right femur. EXAM: DG HIP (WITH OR WITHOUT PELVIS) 1V PORT RIGHT COMPARISON:  Hip radiographs 1 day prior. FINDINGS: Postsurgical changes reflecting interval intramedullary nail fixation of the proximal right femoral fracture is seen. Hardware alignment is within expected limits, without evidence of immediate  complication. Fracture alignment is improved. Femoroacetabular alignment is maintained. There is expected surrounding postoperative soft tissue gas. Heterotopic ossification about the proximal left femur is unchanged. IMPRESSION: Status post intramedullary nail fixation of the proximal right femoral fracture without evidence of complication. Electronically Signed   By: Lesia Hausen M.D.   On: 02/06/2023 12:28   DG FEMUR, MIN 2 VIEWS RIGHT  Result Date: 02/06/2023 CLINICAL DATA:  Intramedullary nail in the right femur. EXAM: RIGHT FEMUR 2 VIEWS COMPARISON:  Hip radiographs 1 day prior FINDINGS: Six C-arm fluoroscopic images were obtained intraoperatively and submitted for post operative interpretation. Intraoperative images during intramedullary nail fixation of the proximal right femoral fracture are seen. Hardware alignment is within expected limits on the final images, without evidence of immediate complication. Fluoro time 48.7 seconds, dose 4.91 mGy. Please see the performing provider's procedural report for further detail. IMPRESSION: Intraoperative images during intramedullary nail fixation of the proximal right femur as above. Electronically Signed   By: Lesia Hausen M.D.   On: 02/06/2023 12:26   DG C-Arm 1-60 Min-No Report  Result Date: 02/06/2023 Fluoroscopy was utilized by the requesting physician.  No radiographic interpretation.        Scheduled Meds:  sodium chloride   Intravenous Once   amLODipine  5 mg Oral Daily   cholecalciferol  1,000 Units Oral Daily   docusate sodium  100 mg Oral BID   hydrALAZINE  50 mg Oral BID   insulin aspart  0-9 Units Subcutaneous TID WC   insulin glargine-yfgn  5 Units Subcutaneous Daily   levothyroxine  88 mcg Oral Daily   metoprolol succinate  25 mg Oral Daily   Continuous Infusions:  sodium chloride 50 mL/hr at 02/07/23 0531   methocarbamol (ROBAXIN) IV       LOS: 2 days    Time spent: 35 minutes    Ramiro Harvest, MD [facility]   To contact the attending provider between 7A-7P or the covering provider during after hours 7P-7A, please log into the web site [email or web address] and access using universal [place] password for that web site. If you do not have the password, please call the hospital operator.  02/07/2023, 12:42 PM

## 2023-02-07 NOTE — Evaluation (Signed)
Occupational Therapy Evaluation Patient Details Name: Barbara Butler MRN: [id number] DOB: [birth year] Today's Date: 02/07/2023   History of Present Illness 89 y/o female adm 7/31 with fall at [facility] with Rt femur fx and Rt humerus fx. 8/1 Rt femur IM nail. 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 HTN, T2DM, anemia, CAD   Clinical Impression   PTA, pt recently at ST[facility] for rehabilitation. Upon eval, pt pleasant and motivated to begin therapy again. Pt presents with limitations in RUE ROM and strength, decreased balance, safety, cognition, pain. Pt requring up to max A for UB and LB ADL. Able to perform STS transfer with max A up from recliner. Would benefit from +2 A to progress with transfers. tWill continue to follow. Patient will benefit from continued inpatient follow up therapy, <3 hours/day.       Recommendations for follow up therapy are one component of a multi-disciplinary discharge planning process, led by the attending physician.  Recommendations may be updated based on patient status, additional functional criteria and insurance authorization.   Assistance Recommended at Discharge Frequent or constant Supervision/Assistance  Patient can return home with the following Two people to help with walking and/or transfers;A lot of help with bathing/dressing/bathroom;Assistance with cooking/housework;Help with stairs or ramp for entrance;Assist for transportation;Direct supervision/assist for medications management;Direct supervision/assist for financial management    Functional Status Assessment  Patient has had a recent decline in their functional status and demonstrates the ability to make significant improvements in function in a reasonable and predictable amount of time.  Equipment Recommendations  Other (comment) (defer)    Recommendations for Other Services       Precautions / Restrictions Precautions Precautions:  Fall;Other (comment) Required Braces or Orthoses: Sling Restrictions Weight Bearing Restrictions: Yes RUE Weight Bearing: Non weight bearing RLE Weight Bearing: Weight bearing as tolerated      Mobility Bed Mobility               General bed mobility comments: in recliner on arrival and departure    Transfers Overall transfer level: Needs assistance Equipment used: 1 person hand held assist Transfers: Sit to/from Stand Sit to Stand: Max assist           General transfer comment: To power up from recliner      Balance Overall balance assessment: Needs assistance, History of Falls Sitting-balance support: No upper extremity supported, Feet supported Sitting balance-Leahy Scale: Fair     Standing balance support: Single extremity supported Standing balance-Leahy Scale: Poor Standing balance comment: LUE support and assist at pelvis for standing                           ADL either performed or assessed with clinical judgement   ADL Overall ADL's : Needs assistance/impaired Eating/Feeding: Minimal assistance   Grooming: Sitting;Minimal assistance   Upper Body Bathing: Moderate assistance;Sitting   Lower Body Bathing: Maximal assistance;Sit to/from stand   Upper Body Dressing : Sitting;Maximal assistance Upper Body Dressing Details (indicate cue type and reason): for sling management Lower Body Dressing: Maximal assistance   Toilet Transfer: Maximal assistance;Stand-pivot Toilet Transfer Details (indicate cue type and reason): Max A to rise from low surface of recliner chair         Functional mobility during ADLs: Maximal assistance General ADL Comments: STS this session and education regarding sling, UE exercise to reduce edema, and compensatory techniques for UB ADL  Vision Ability to See in Adequate Light: 0 Adequate Patient Visual Report: No change from baseline Additional Comments: Pt reports no visual changes after CVA, may need  further assessment     Perception Perception Perception Tested?: No   Praxis Praxis Praxis tested?: Not tested    Pertinent Vitals/Pain Pain Assessment Pain Assessment: Faces Faces Pain Scale: Hurts even more Pain Location: RLE Pain Descriptors / Indicators: Aching Pain Intervention(s): Limited activity within patient's tolerance, Monitored during session     Hand Dominance Right   Extremity/Trunk Assessment Upper Extremity Assessment Upper Extremity Assessment: RUE deficits/detail RUE Deficits / Details: sling, NWB, acute fx. Able to use R hand finctionally and perform finger flex/ext, wrist flex/ext and forearm supination on command. AAROM for elbow flexion to 100 degrees before onset pain   Lower Extremity Assessment Lower Extremity Assessment: Defer to PT evaluation RLE Deficits / Details: pt needing assist for all movement of RLE due to post op pain with limited ROM   Cervical / Trunk Assessment Cervical / Trunk Assessment: Normal   Communication Communication Communication: No difficulties;HOH (mildly HOH)   Cognition Arousal/Alertness: Awake/alert Behavior During Therapy: WFL for tasks assessed/performed Overall Cognitive Status: Impaired/Different from baseline Area of Impairment: Problem solving, Safety/judgement, Awareness, Following commands                       Following Commands: Follows one step commands consistently, Follows one step commands with increased time, Follows multi-step commands inconsistently Safety/Judgement: Decreased awareness of safety Awareness: Emergent Problem Solving: Slow processing General Comments: slow processing needing repetition for cues and questions at times. mod cues for sequencing transfer     General Comments       Exercises Exercises: Other exercises Other Exercises Other Exercises: AROM wrist/hand/forearm composite flexion/extension, wrist flexion/extension, forearm pronation/supination Other Exercises:  AAAROM elbow flexion/extension   Shoulder Instructions      Home Living Family/patient expects to be discharged to:: Skilled nursing facility Living Arrangements: Children Available Help at Discharge: Family;Available PRN/intermittently Type of Home: House Home Access: Stairs to enter [employer] of Steps: 4 Entrance Stairs-Rails: Right;Left;Can reach both Home Layout: One level     Bathroom Shower/Tub: Tub/shower unit;Walk-in Human resources officer: Standard     Home Equipment: None      Lives With: Alone    Prior Functioning/Environment Prior Level of Function : Independent/Modified Independent;Driving             Mobility Comments: RW and assist since CVA ADLs Comments: Since CVA, has been receiving assist with bathing and dressing. prior, was driving and grocery shopping, indep with ADL        OT Problem List: Decreased cognition;Impaired balance (sitting and/or standing);Decreased strength;Decreased activity tolerance;Decreased range of motion;Decreased knowledge of use of DME or AE;Decreased knowledge of precautions;Impaired UE functional use      OT Treatment/Interventions: Self-care/ADL training;Therapeutic exercise;Cognitive remediation/compensation;Therapeutic activities;Patient/family education    OT Goals(Current goals can be found in the care plan section) Acute Rehab OT Goals Patient Stated Goal: get better OT Goal Formulation: With patient Time For Goal Achievement: 02/21/23 Potential to Achieve Goals: Good  OT Frequency: Min 2X/week    Co-evaluation              AM-PAC OT "6 Clicks" Daily Activity     Outcome Measure Help from another person eating meals?: A Little Help from another person taking care of personal grooming?: A Little Help from another person toileting, which includes using toliet, bedpan, or urinal?:  A Lot Help from another person bathing (including washing, rinsing, drying)?: A Lot Help from another person  to put on and taking off regular upper body clothing?: A Lot Help from another person to put on and taking off regular lower body clothing?: A Lot 6 Click Score: 14   End of Session Equipment Utilized During Treatment: Gait belt;Other (comment) (sling) Nurse Communication: Mobility status  Activity Tolerance: Patient tolerated treatment well Patient left: in chair;with call bell/phone within reach;with chair alarm set  OT Visit Diagnosis: Unsteadiness on feet (R26.81);Muscle weakness (generalized) (M62.81);History of falling (Z91.81);Other symptoms and signs involving cognitive function                Time: 0937-1010 OT Time Calculation (min): 33 min Charges:  OT General Charges $OT Visit: 1 Visit OT Evaluation $OT Eval Moderate Complexity: 1 Mod OT Treatments $Self Care/Home Management : 8-22 mins  Tyler Deis, OTR/L [facility] Acute Rehabilitation Office: [phone]   Myrla Halsted 02/07/2023, 11:19 AM

## 2023-02-07 NOTE — Progress Notes (Signed)
Initial Nutrition Assessment  DOCUMENTATION CODES:   Not applicable  INTERVENTION:  Encourage adequate PO intake Ensure Enlive po once daily, each supplement provides 350 kcal and 20 grams of protein  NUTRITION DIAGNOSIS:   Increased nutrient needs related to post-op healing, hip fracture as evidenced by estimated needs  GOAL:   Patient will meet greater than or equal to 90% of their needs  MONITOR:   PO intake, Labs, Weight trends, Supplement acceptance  REASON FOR ASSESSMENT:   Consult Hip fracture protocol  ASSESSMENT:   Pt recently admitted 7/21-7/22 for subacute CVA and was discharged to [facility]. Pt admitted after a fall leading to R hip and R humerus fracture. PMH significant for DM, HTN, hypothyroidism, recent CVA, and afib.   8/1 - s/p cephalomedullary nailing of R femur  Pt sitting up in chair at time of visit. She reports having a good appetite. Feels that her portion sizes have reduced but only minimally. She continues to eat 3 meals per day with a variety of nutrients. She denies difficulty chewing/swallowing.   Meal completions: 8/2: 100% breakfast  Pt reports that her weight has slow and gradually been declining since 2020. Unfortunately there is limited documentation of weight history on file to review within the last year. Suspect currently weight is likely stated versus actual.   Pt states that she has been managing her diabetes with medication at home. Her blood sugars have been more elevated during admission versus when she checks them at home.   Medications: Vitamin D3, colace, SSI 0-9 units TID, semglee 5 units  Labs: sodium 133, BUN 35, Cr 1.03, GFR 51, HgbA1c 7.9%, CBG's 176-278 x24 hours  NUTRITION - FOCUSED PHYSICAL EXAM:  Flowsheet Row Most Recent Value  Orbital Region No depletion  Upper Arm Region No depletion  Thoracic and Lumbar Region No depletion  Buccal Region No depletion  Temple Region No depletion  Clavicle Bone Region No  depletion  Clavicle and Acromion Bone Region No depletion  Scapular Bone Region No depletion  Dorsal Hand Mild depletion  Patellar Region No depletion  Anterior Thigh Region No depletion  Posterior Calf Region No depletion  Edema (RD Assessment) None  Hair Reviewed  Eyes Reviewed  Mouth Reviewed  Skin Reviewed  Nails Reviewed       Diet Order:   Diet Order             Diet Carb Modified Fluid consistency: Thin; Room service appropriate? Yes  Diet effective now                   EDUCATION NEEDS:   Education needs have been addressed  Skin:  Skin Assessment: Reviewed RN Assessment (R thigh closed incision)  Last BM:  7/29  Height:   Ht Readings from Last 1 Encounters:  02/06/23 5\' 2"  (1.575 m)    Weight:   Wt Readings from Last 1 Encounters:  02/06/23 55 kg   BMI:  Body mass index is 22.18 kg/m.  Estimated Nutritional Needs:   Kcal:  1300-1500  Protein:  65-80g  Fluid:  1.3-1.5L  Drusilla Kanner, RDN, LDN Clinical Nutrition

## 2023-02-08 DIAGNOSIS — I6310 Cerebral infarction due to embolism of unspecified precerebral artery: Secondary | ICD-10-CM | POA: Diagnosis not present

## 2023-02-08 DIAGNOSIS — I4891 Unspecified atrial fibrillation: Secondary | ICD-10-CM | POA: Diagnosis not present

## 2023-02-08 DIAGNOSIS — S72001A Fracture of unspecified part of neck of right femur, initial encounter for closed fracture: Secondary | ICD-10-CM | POA: Diagnosis not present

## 2023-02-08 DIAGNOSIS — W19XXXA Unspecified fall, initial encounter: Secondary | ICD-10-CM | POA: Diagnosis not present

## 2023-02-08 LAB — GLUCOSE, CAPILLARY
Glucose-Capillary: 203 mg/dL — ABNORMAL HIGH (ref 70–99)
Glucose-Capillary: 213 mg/dL — ABNORMAL HIGH (ref 70–99)
Glucose-Capillary: 290 mg/dL — ABNORMAL HIGH (ref 70–99)
Glucose-Capillary: 177 mg/dL — ABNORMAL HIGH (ref 70–99)

## 2023-02-08 MED ORDER — APIXABAN 2.5 MG PO TABS
2.5000 mg | ORAL_TABLET | Freq: Two times a day (BID) | ORAL | Status: DC
  Administered 2023-02-08: 2.5 mg via ORAL

## 2023-02-08 MED ORDER — APIXABAN 2.5 MG PO TABS
2.5000 mg | ORAL_TABLET | Freq: Two times a day (BID) | ORAL | Status: DC
  Administered 2023-02-09 – 2023-02-11 (×5): 2.5 mg via ORAL
  Filled 2023-02-08 (×5): qty 1

## 2023-02-08 MED ORDER — INSULIN GLARGINE-YFGN 100 UNIT/ML ~~LOC~~ SOLN
8.0000 [IU] | Freq: Every day | SUBCUTANEOUS | Status: DC
  Administered 2023-02-08 – 2023-02-11 (×4): 8 [IU] via SUBCUTANEOUS
  Filled 2023-02-08 (×4): qty 0.08

## 2023-02-08 MED ORDER — INSULIN ASPART 100 UNIT/ML IJ SOLN
2.0000 [IU] | Freq: Three times a day (TID) | INTRAMUSCULAR | Status: DC
  Administered 2023-02-09 – 2023-02-11 (×8): 2 [IU] via SUBCUTANEOUS

## 2023-02-08 NOTE — Progress Notes (Signed)
ANTICOAGULATION CONSULT NOTE - Initial Consult  Pharmacy Consult for apixaban Indication:  Atrial Fibrillation  No Known Allergies  Patient Measurements: Height: 5\' 2"  (157.5 cm) Weight: 55 kg (121 lb 4.1 oz) IBW/kg (Calculated) : 50.1  Vital Signs: Temp: 98.2 F (36.8 C) (08/03 0759) Temp Source: Oral (08/03 0759) BP: 151/85 (08/03 0759) Pulse Rate: 97 (08/03 0759)  Labs: Recent Labs    02/05/23 2335 02/06/23 1235 02/07/23 0407 02/08/23 0343  HGB 7.1* 11.5* 8.8* 8.8*  HCT 21.3* 33.1* 25.5* 26.6*  PLT 156 133* 121* 122*  CREATININE 1.11*  --  1.03* 1.00    Estimated Creatinine Clearance: 29 mL/min (by C-G formula based on SCr of 1 mg/dL).   Medical History: Past Medical History:  Diagnosis Date   Anemia    Diabetes mellitus without complication (HCC)    diet controlled   Diabetic neuropathy (HCC)    Dysrhythmia    Hypertension    Hypothyroidism     Medications:  Medications Prior to Admission  Medication Sig Dispense Refill Last Dose   amLODipine (NORVASC) 5 MG tablet Take 5 mg by mouth daily.   02/04/2023   apixaban (ELIQUIS) 2.5 MG TABS tablet Take 1 tablet (2.5 mg total) by mouth 2 (two) times daily. 60 tablet 1 02/04/2023 at 6:50 pm   Ascorbic Acid (VITAMIN C) 1000 MG tablet Take 1,000 mg by mouth daily at 12 noon.   02/04/2023   aspirin EC 81 MG tablet Take 81 mg by mouth daily at 12 noon.   02/04/2023   cholecalciferol (VITAMIN D3) 25 MCG (1000 UT) tablet Take 1,000 Units by mouth daily at 12 noon.   02/04/2023   hydrALAZINE (APRESOLINE) 50 MG tablet Take 50 mg by mouth 2 (two) times a day.   02/04/2023   levothyroxine (SYNTHROID, LEVOTHROID) 88 MCG tablet Take 88 mcg by mouth daily.    02/04/2023   MEGARED OMEGA-3 KRILL OIL PO Take 750 mg by mouth daily at 12 noon.   02/04/2023   metFORMIN (GLUCOPHAGE) 500 MG tablet Take 500 mg by mouth 2 (two) times daily.   02/04/2023   metoprolol succinate (TOPROL-XL) 25 MG 24 hr tablet Take 1 tablet by mouth daily.    02/04/2023   olmesartan (BENICAR) 40 MG tablet Take 40 mg by mouth daily.   02/04/2023   Scheduled:   sodium chloride   Intravenous Once   amLODipine  5 mg Oral Daily   [START ON 02/09/2023] apixaban  2.5 mg Oral BID   cholecalciferol  1,000 Units Oral Daily   docusate sodium  100 mg Oral BID   feeding supplement  237 mL Oral Q24H   hydrALAZINE  50 mg Oral BID   insulin aspart  0-9 Units Subcutaneous TID WC   insulin glargine-yfgn  8 Units Subcutaneous Daily   levothyroxine  88 mcg Oral Daily   metoprolol succinate  25 mg Oral Daily   Infusions:   methocarbamol (ROBAXIN) IV      Assessment: Mr. Freeburg is a 84 YOF admitted s/p a fall resulting in a R hip and R femur fracture. Patient had a nailing of right femur on 02/06/23. Patient on Eliquis PTA for atrial fibrillation and recent CVA on 01/27/23. Eliquis being held for procedure and due to increase fall risk post-op. Appropriate to resume Eliquis today per MD. CBC stable.   Goal of Therapy:  Monitor platelets by anticoagulation protocol: Yes   Plan:  Eliquis 2.5 mg PO BID resumed by MD on 02/08/23  Mardella Layman  Helaine Chess, PharmD PGY-1 Acute Care Pharmacy Resident 02/08/2023 10:25 AM

## 2023-02-08 NOTE — Progress Notes (Addendum)
Progress Note  SUBJECTIVE: Doing well this morning, denies pain. No chest pain. No SOB. No nausea/vomiting. No other complaints.  Tolerating diet and fluids.  OBJECTIVE:  Vitals:   02/07/23 2039 02/08/23 0759  BP: 128/78 (!) 151/85  Pulse:  97  Resp: 16 18  Temp: 98.7 F (37.1 C) 98.2 F (36.8 C)  SpO2: 98% 100%    General: In bed. No acute distress Respiratory: No increased work of breathing.  Right lower extremity: Dressings clean, dry, intact.  Tenderness over the hip as expected.  Tolerates ankle DF/PF.  Endorses sensation of light touch distally. + DP pulse Right upper extremity: Sling in place. Mildly tender about shoulder. Tolerates gentle elbow and wrist motion. Shoulder motion not tested. Endorses sensation throughout extremity. Motor function intact through median, ulnar, radial nerve distribution. +radial pulse. Sensation intact to axillary nerve distribution.  IMAGING: Stable post op imaging.   LABS:  Results for orders placed or performed during the hospital encounter of 02/05/23 (from the past 24 hour(s))  Glucose, capillary     Status: Abnormal   Collection Time: 02/07/23 11:53 AM  Result Value Ref Range   Glucose-Capillary 217 (H) 70 - 99 mg/dL  Glucose, capillary     Status: Abnormal   Collection Time: 02/07/23  3:58 PM  Result Value Ref Range   Glucose-Capillary 230 (H) 70 - 99 mg/dL   Comment 1 Document in Chart   Glucose, capillary     Status: Abnormal   Collection Time: 02/07/23  9:32 PM  Result Value Ref Range   Glucose-Capillary 236 (H) 70 - 99 mg/dL  CBC     Status: Abnormal   Collection Time: 02/08/23  3:43 AM  Result Value Ref Range   WBC 9.0 4.0 - 10.5 K/uL   RBC 3.00 (L) 3.87 - 5.11 MIL/uL   Hemoglobin 8.8 (L) 12.0 - 15.0 g/dL   HCT 48.5 (L) 46.2 - 70.3 %   MCV 88.7 80.0 - 100.0 fL   MCH 29.3 26.0 - 34.0 pg   MCHC 33.1 30.0 - 36.0 g/dL   RDW 50.0 93.8 - 18.2 %   Platelets 122 (L) 150 - 400 K/uL   nRBC 3.1 (H) 0.0 - 0.2 %  Basic metabolic  panel     Status: Abnormal   Collection Time: 02/08/23  3:43 AM  Result Value Ref Range   Sodium 132 (L) 135 - 145 mmol/L   Potassium 4.5 3.5 - 5.1 mmol/L   Chloride 105 98 - 111 mmol/L   CO2 23 22 - 32 mmol/L   Glucose, Bld 205 (H) 70 - 99 mg/dL   BUN 36 (H) 8 - 23 mg/dL   Creatinine, Ser 9.93 0.44 - 1.00 mg/dL   Calcium 8.2 (L) 8.9 - 10.3 mg/dL   GFR, Estimated 53 (L) >60 mL/min   Anion gap 4 (L) 5 - 15  Glucose, capillary     Status: Abnormal   Collection Time: 02/08/23  8:02 AM  Result Value Ref Range   Glucose-Capillary 203 (H) 70 - 99 mg/dL    ASSESSMENT:  Right intertrochanteric femur fracture Right proximal humerus fracture  Barbara Butler is a 89 y.o. female, 2 Days Post-Op s/p INTRAMEDULLARY NAILING OF RIGHT FEMUR   CV/Blood loss: Acute blood loss anemia, Hgb 8.8 again this morning. Hemodynamically stable  PLAN: Weightbearing: WBAT RLE. NWB RUE ROM: Unrestricted ROM RLE. Maintain sling RUE. Ok for gentle elbow/wrist motion Incisional and dressing care: Reinforce dressings as needed  Showering: Okay to begin  showering getting incisions wet 02/09/2023 Orthopedic device(s): None  Pain management: Continue current multimodal regimen 1. Tylenol 650 mg q 6 hours scheduled 2. Robaxin 500 mg q 6 hours PRN 3. Oxycodone 5-10 mg q 4 hours PRN 4. Dilaudid 1 mg q 3 hours PRN VTE prophylaxis: Hbg 8.8 today, per order criteria will hold today and recheck tomorrow. SCDs ID:  Ancef 2gm post op Foley/Lines:  No foley, KVO IVFs Impediments to Fracture Healing: Vitamin D level low normal at 36, started on daily supplementation Dispo: Plan to discharge to SNF. Okay for discharge from ortho standpoint once cleared by medicine team/therapy teams  D/C recommendations: -Tylenol for pain control - home dose Eliquis for DVT prophylaxis -Continue 1000 units daily Vit D supplementation  Follow - up plan: 2 weeks w/ Truitt Merle MD/Sarah Trena Platt.   Barbara Ores, PA-C After hours  and holidays please check [email or web address] for group call information for [facility]

## 2023-02-08 NOTE — Progress Notes (Signed)
PROGRESS NOTE    Barbara Butler  ZOX:[id number] DOB: 20-Nov-1929 DOA: 02/05/2023 PCP: [name], MD    Chief Complaint  Patient presents with   Fall    Patient to ED via EMS with complaint of mechanical fall around 1900 last night. Patient takes eliquis. Patient unable to ambulate after fall. Patient independent ADL at baseline. EMS reports patient states right shoulder pain and right hip pain. EMS reports right leg shortening and rotation.    Brief Narrative:  Patient is a 89 year old female history of diabetes, hypertension, hypothyroidism, recent CVA, A-fib on Eliquis presented with a fall.  Patient noted to have recently been hospitalized 7/21-22 for subacute CVA and discharged to [facility].  Patient noted to have had an accident where she fell hurting her right shoulder and right lower extremity.  Patient seen in the ED, imaging done concerning for right femur fracture and right humeral neck fracture.  Orthopedics consulted.   Assessment & Plan:   Principal Problem:   Closed right hip fracture, initial encounter [facility]) Active Problems:   Type 2 diabetes mellitus with hyperglycemia, without long-term current use of insulin (HCC)   Hypertension   Hypothyroidism   Cerebrovascular accident (CVA) (HCC)   Atrial fibrillation/flutter (HCC)   Fall at nursing home, initial encounter   Humeral surgical neck fracture   DNR (do not resuscitate)   Fall  #1 closed right hip fracture -Secondary to mechanical fall at SNF resulting in right hip fracture. -Patient seen in consultation by orthopedics and patient subsequently underwent cephalomedullary nailing of right intertrochanteric femur fracture, 02/06/2023 per orthopedics. -Continue current pain regimen of Tylenol, Robaxin, oxycodone, morphine as needed. -PT/OT. -Will need outpatient follow-up with orthopedics. -Per orthopedics  2.  Right humerus fracture -Patient noted with a right surgical neck humerus fracture, secondary  to mechanical fall. -Patient seen in consult by orthopedics who are recommending conservative management 6, nonoperative management at this time with sling and NWB. -Outpatient follow-up.  3.  AKI -Patient appears to have CKD stage IIIa. -Stable.  Follow.  4.  Prolonged QTc -Avoid QT prolonging medications such as PPI, nausea medications, SSRIs. -Repeat EKG with resolution of QTc prolongation.   -Keep potassium approximately 4, magnesium approximately 2.   5.  Recent CVA -Patient hospitalized recently earlier on this month for CVA. -Patient with history of new onset A-fib and also at increased risk for stroke recurrence without anticoagulation. -Consider early resumption of Eliquis postop.  6.  Atrial fibrillation -Toprol-XL for rate control.   -Eliquis on hold.   -Eliquis was ordered this morning by me as hemoglobin had remained stable however orthopedics recommending holding Eliquis for another day and monitoring hemoglobin.  7.  Hypertension -BP currently controlled on Toprol-XL, amlodipine, hydralazine.   -Continue to hold ARB.    8.  Diabetes mellitus type 2 -Hemoglobin A1c 7.9 (01/27/2023) -CBG 203 this morning. -Continue to hold home regimen oral hypoglycemic agents.  -Increase Semglee to 8 units daily.   -Start meal coverage NovoLog 2 units 3 times daily with meals.  -SSI.    9.  Anemia, NOS -Status post transfusion 2 units PRBCs. -Hemoglobin currently stable at 8.8 from as low as 7.1. -Follow H&H. -Transfusion threshold hemoglobin < 7.   DVT prophylaxis: Postop DVT prophylaxis per orthopedics. Code Status: DNR Family Communication: Updated patient.  No family at bedside. Disposition: Likely back to SNF when medically stable.  Status is: Inpatient Remains inpatient appropriate because: Severity of illness   Consultants:  Orthopedics: Dr. Jena Gauss 02/06/2023  Procedures:  CT head CT C-spine 02/05/2023 CT chest abdomen and pelvis 02/05/2023 Cephalomedullary  nailing of right intratrochanteric femur fracture per orthopedics: Dr. Jena Gauss 02/06/2023 2 units PRBCs transfused 02/06/2023  Antimicrobials:  Anti-infectives (From admission, onward)    Start     Dose/Rate Route Frequency Ordered Stop   02/06/23 2000  ceFAZolin (ANCEF) IVPB 2g/100 mL premix        2 g 200 mL/hr over 30 Minutes Intravenous Every 12 hours 02/06/23 1008 02/07/23 0920   02/06/23 0817  vancomycin (VANCOCIN) powder  Status:  Discontinued          As needed 02/06/23 0817 02/06/23 0845   02/06/23 0700  ceFAZolin (ANCEF) IVPB 2g/100 mL premix        2 g 200 mL/hr over 30 Minutes Intravenous On call to O.R. 02/06/23 9604 02/06/23 0823   02/06/23 0626  ceFAZolin (ANCEF) 2-4 GM/100ML-% IVPB       Note to Pharmacy: Gleason, Ginger E: cabinet override      02/06/23 0626 02/07/23 0920         Subjective: Laying in bed.  Denies any chest pain or shortness of breath.  No abdominal pain.  Some right hip discomfort which is slowly improving.  Tolerating diet.  Denies any bleeding.   Objective: Vitals:   02/07/23 2039 02/08/23 0759 02/08/23 1219 02/08/23 1644  BP: 128/78 (!) 151/85 (!) 144/79 130/76  Pulse:  97 90 (!) 102  Resp: 16 18  16   Temp: 98.7 F (37.1 C) 98.2 F (36.8 C) 98.5 F (36.9 C) 98.6 F (37 C)  TempSrc: Oral Oral  Oral  SpO2: 98% 100% 98% 100%  Weight:      Height:        Intake/Output Summary (Last 24 hours) at 02/08/2023 1703 Last data filed at 02/08/2023 1400 Gross per 24 hour  Intake --  Output 1150 ml  Net -1150 ml   Filed Weights   02/05/23 0527 02/06/23 0655  Weight: 55 kg 55 kg    Examination:  General exam: NAD. Respiratory system: Clear to auscultation bilaterally anterior lung fields.  No wheezes, no crackles, no rhonchi.  Fair air movement.  Speaking in full sentences.  Cardiovascular system: RRR no murmurs rubs or gallops.  Regular rate rhythm no murmurs rubs or gallops.  No pitting lower extremity edema.  Gastrointestinal system:  Abdomen is soft, nontender, nondistended, positive bowel sounds.  No rebound.  No guarding.  Central nervous system: Alert and oriented. No focal neurological deficits. Extremities: Postop dressing intact on right hip.  Right hip with no significant tenderness to palpation.  Right upper extremity in sling.  Skin: No rashes, lesions or ulcers Psychiatry: Judgement and insight appear normal. Mood & affect appropriate.     Data Reviewed: I have personally reviewed following labs and imaging studies  CBC: Recent Labs  Lab 02/05/23 0532 02/05/23 0543 02/05/23 2335 02/06/23 1235 02/07/23 0407 02/08/23 0343  WBC 11.3*  --  11.2* 14.3* 10.3 9.0  NEUTROABS 10.0*  --   --  12.5*  --   --   HGB 8.8* 8.8* 7.1* 11.5* 8.8* 8.8*  HCT 27.5* 26.0* 21.3* 33.1* 25.5* 26.6*  MCV 88.1  --  87.3 86.2 84.7 88.7  PLT 215  --  156 133* 121* 122*    Basic Metabolic Panel: Recent Labs  Lab 02/05/23 0532 02/05/23 0543 02/05/23 1809 02/05/23 2335 02/06/23 1235 02/07/23 0407 02/08/23 0343  NA 136 136 135 135  --  133* 132*  K 4.2 4.3 3.9 4.6  --  4.1 4.5  CL 102  --  104 102  --  104 105  CO2 16*  --  20* 19*  --  21* 23  GLUCOSE 323*  --  176* 256*  --  195* 205*  BUN 27*  --  31* 33*  --  35* 36*  CREATININE 1.26*  --  1.18* 1.11*  --  1.03* 1.00  CALCIUM 8.9  --  8.7* 8.7*  --  8.0* 8.2*  MG  --   --   --   --  1.9  --   --     GFR: Estimated Creatinine Clearance: 29 mL/min (by C-G formula based on SCr of 1 mg/dL).  Liver Function Tests: Recent Labs  Lab 02/05/23 0532  AST 36  ALT 31  ALKPHOS 43  BILITOT 0.8  PROT 5.4*  ALBUMIN 3.1*    CBG: Recent Labs  Lab 02/07/23 1558 02/07/23 2132 02/08/23 0802 02/08/23 1239 02/08/23 1649  GLUCAP 230* 236* 203* 213* 290*     No results found for this or any previous visit (from the past 240 hour(s)).       Radiology Studies: No results found.      Scheduled Meds:  sodium chloride   Intravenous Once   amLODipine  5 mg  Oral Daily   [START ON 02/09/2023] apixaban  2.5 mg Oral BID   cholecalciferol  1,000 Units Oral Daily   docusate sodium  100 mg Oral BID   feeding supplement  237 mL Oral Q24H   hydrALAZINE  50 mg Oral BID   insulin aspart  0-9 Units Subcutaneous TID WC   insulin glargine-yfgn  8 Units Subcutaneous Daily   levothyroxine  88 mcg Oral Daily   metoprolol succinate  25 mg Oral Daily   Continuous Infusions:  methocarbamol (ROBAXIN) IV       LOS: 3 days    Time spent: 35 minutes    Ramiro Harvest, MD [facility]   To contact the attending provider between 7A-7P or the covering provider during after hours 7P-7A, please log into the web site [email or web address] and access using universal [place] password for that web site. If you do not have the password, please call the hospital operator.  02/08/2023, 5:03 PM

## 2023-02-08 NOTE — Progress Notes (Signed)
Physical Therapy Treatment Patient Details Name: Barbara Butler MRN: [id number] DOB: [birth year] Today's Date: 02/08/2023   History of Present Illness 89 y/o female adm 7/31 with fall at [facility] with Rt femur fx and Rt humerus fx. 8/1 Rt femur IM nail. PMHx: [facility] admission 7/21-24/24 for bil subacute infarcts in occipital lobes and new AFib. HTN, T2DM, anemia, CAD    PT Comments  Tolerated treatment well, agreeable to session and OOB to recliner today. Required max assist for bed mobility, sit to stand transfer with LUE support (HHA,) and stand pivot transfer to recliner. Multimodal cues throughout for technique and sequencing. Able to stand EOB and initiate some weight shift towards Rt. Family present and supportive. Verbalizes understanding use of call bell for needs. Patient will continue to benefit from skilled physical therapy services to further improve independence with functional mobility.     If plan is discharge home, recommend the following: Assistance with cooking/housework;Direct supervision/assist for medications management;Direct supervision/assist for financial management;Assist for transportation;Help with stairs or ramp for entrance;A lot of help with bathing/dressing/bathroom;Two people to help with walking and/or transfers   Can travel by private vehicle     No  Equipment Recommendations  Wheelchair (measurements PT);Wheelchair cushion (measurements PT)    Recommendations for Other Services       Precautions / Restrictions Precautions Precautions: Fall;Other (comment) Required Braces or Orthoses: Sling Restrictions Weight Bearing Restrictions: Yes RUE Weight Bearing: Non weight bearing RLE Weight Bearing: Weight bearing as tolerated     Mobility  Bed Mobility Overal bed mobility: Needs Assistance Bed Mobility: Supine to Sit     Supine to sit: Max assist, HOB elevated     General bed mobility comments: Able to facilitate LLE out of bed with VC.  Max assist for trunk and RLE to EOB and scooting with bed pad. Cues throughout.    Transfers Overall transfer level: Needs assistance Equipment used: 1 person hand held assist Transfers: Sit to/from Stand Sit to Stand: Max assist Stand pivot transfers: Max assist         General transfer comment: Max assist for boost to stand from bed, HHA on Lt, limited WB tolerance on Rt. Pt able to pivot on Lt foot with max assist for balance towards non affected side. Bearing up to 50% Wt through RLE reportedly. Cues for technique.    Ambulation/Gait                   Stairs             Wheelchair Mobility     Tilt Bed    Modified Rankin (Stroke Patients Only)       Balance Overall balance assessment: Needs assistance, History of Falls Sitting-balance support: No upper extremity supported, Feet supported Sitting balance-Leahy Scale: Fair   Postural control: Left lateral lean Standing balance support: Single extremity supported Standing balance-Leahy Scale: Poor Standing balance comment: Assist via LUE and wait for balance, limited WB through RLE.                            Cognition Arousal/Alertness: Awake/alert Behavior During Therapy: WFL for tasks assessed/performed Overall Cognitive Status: Impaired/Different from baseline Area of Impairment: Following commands, Problem solving                       Following Commands: Follows one step commands with increased time, Follows multi-step commands inconsistently  Problem Solving: Slow processing, Requires tactile cues, Difficulty sequencing, Requires verbal cues, Decreased initiation          Exercises General Exercises - Lower Extremity Ankle Circles/Pumps: AROM, Both, 10 reps, Seated Quad Sets: Strengthening, Both, 5 reps, Seated Gluteal Sets: Strengthening, Both, 5 reps, Seated    General Comments        Pertinent Vitals/Pain Pain Assessment Pain Assessment: Faces Faces  Pain Scale: Hurts even more Pain Location: RLE Pain Descriptors / Indicators: Aching    Home Living                          Prior Function            PT Goals (current goals can now be found in the care plan section) Acute Rehab PT Goals Patient Stated Goal: be able to walk PT Goal Formulation: With patient Time For Goal Achievement: 02/21/23 Potential to Achieve Goals: Fair Progress towards PT goals: Progressing toward goals    Frequency    Min 1X/week      PT Plan Current plan remains appropriate    Co-evaluation              AM-PAC PT "6 Clicks" Mobility   Outcome Measure  Help needed turning from your back to your side while in a flat bed without using bedrails?: A Lot Help needed moving from lying on your back to sitting on the side of a flat bed without using bedrails?: A Lot Help needed moving to and from a bed to a chair (including a wheelchair)?: A Lot Help needed standing up from a chair using your arms (e.g., wheelchair or bedside chair)?: A Lot Help needed to walk in hospital room?: Total Help needed climbing 3-5 steps with a railing? : Total 6 Click Score: 10    End of Session Equipment Utilized During Treatment: Gait belt Activity Tolerance: Patient tolerated treatment well Patient left: in chair;with call bell/phone within reach;with chair alarm set;with family/visitor present;Other (comment) (RUE sling in place)   PT Visit Diagnosis: Unsteadiness on feet (R26.81);Muscle weakness (generalized) (M62.81);Other abnormalities of gait and mobility (R26.89);History of falling (Z91.81);Pain Pain - Right/Left: Right Pain - part of body: Hip     Time: 1238-1259 PT Time Calculation (min) (ACUTE ONLY): 21 min  Charges:    $Therapeutic Activity: 8-22 mins PT General Charges $$ ACUTE PT VISIT: 1 Visit                     Kathlyn Sacramento, PT, DPT [facility]  Rehabilitation Services Physical Therapist Office: [phone] Website:  [email or web address]    Berton Mount 02/08/2023, 2:49 PM

## 2023-02-09 DIAGNOSIS — S72001A Fracture of unspecified part of neck of right femur, initial encounter for closed fracture: Secondary | ICD-10-CM | POA: Diagnosis not present

## 2023-02-09 DIAGNOSIS — W19XXXA Unspecified fall, initial encounter: Secondary | ICD-10-CM | POA: Diagnosis not present

## 2023-02-09 DIAGNOSIS — I6310 Cerebral infarction due to embolism of unspecified precerebral artery: Secondary | ICD-10-CM | POA: Diagnosis not present

## 2023-02-09 DIAGNOSIS — I4891 Unspecified atrial fibrillation: Secondary | ICD-10-CM | POA: Diagnosis not present

## 2023-02-09 LAB — BASIC METABOLIC PANEL WITH GFR
Anion gap: 8 (ref 5–15)
BUN: 23 mg/dL (ref 8–23)
CO2: 22 mmol/L (ref 22–32)
Calcium: 8.3 mg/dL — ABNORMAL LOW (ref 8.9–10.3)
Chloride: 103 mmol/L (ref 98–111)
Creatinine, Ser: 0.83 mg/dL (ref 0.44–1.00)
GFR, Estimated: >60 mL/min (ref >60)
Glucose, Bld: 179 mg/dL — ABNORMAL HIGH (ref 70–99)
Potassium: 4.3 mmol/L (ref 3.5–5.1)
Sodium: 133 mmol/L — ABNORMAL LOW (ref 135–145)

## 2023-02-09 LAB — CBC
HCT: 27.7 % — ABNORMAL LOW (ref 36.0–46.0)
Hemoglobin: 9 g/dL — ABNORMAL LOW (ref 12.0–15.0)
MCH: 28.4 pg (ref 26.0–34.0)
MCHC: 32.5 g/dL (ref 30.0–36.0)
MCV: 87.4 fL (ref 80.0–100.0)
Platelets: 156 10*3/uL (ref 150–400)
RBC: 3.17 MIL/uL — ABNORMAL LOW (ref 3.87–5.11)
RDW: 15.2 % (ref 11.5–15.5)
WBC: 8.1 10*3/uL (ref 4.0–10.5)
nRBC: 1 % — ABNORMAL HIGH (ref 0.0–0.2)

## 2023-02-09 LAB — GLUCOSE, CAPILLARY
Glucose-Capillary: 174 mg/dL — ABNORMAL HIGH (ref 70–99)
Glucose-Capillary: 147 mg/dL — ABNORMAL HIGH (ref 70–99)
Glucose-Capillary: 188 mg/dL — ABNORMAL HIGH (ref 70–99)

## 2023-02-09 LAB — MAGNESIUM: Magnesium: 1.7 mg/dL (ref 1.7–2.4)

## 2023-02-09 MED ORDER — MAGNESIUM SULFATE 2 GM/50ML IV SOLN
2.0000 g | Freq: Once | INTRAVENOUS | Status: AC
  Administered 2023-02-09: 2 g via INTRAVENOUS
  Filled 2023-02-09: qty 50

## 2023-02-09 NOTE — Plan of Care (Signed)
  Problem: Nutrition: Goal: Adequate nutrition will be maintained Outcome: Progressing   Problem: Coping: Goal: Level of anxiety will decrease Outcome: Progressing   Problem: Pain Managment: Goal: General experience of comfort will improve Outcome: Progressing   Problem: Safety: Goal: Ability to remain free from injury will improve Outcome: Progressing   

## 2023-02-09 NOTE — Progress Notes (Signed)
PROGRESS NOTE    Barbara Butler  BMW:[id number] DOB: Dec 03, 1929 DOA: 02/05/2023 PCP: [name], MD    Chief Complaint  Patient presents with   Fall    Patient to ED via EMS with complaint of mechanical fall around 1900 last night. Patient takes eliquis. Patient unable to ambulate after fall. Patient independent ADL at baseline. EMS reports patient states right shoulder pain and right hip pain. EMS reports right leg shortening and rotation.    Brief Narrative:  Patient is a 89 year old female history of diabetes, hypertension, hypothyroidism, recent CVA, A-fib on Eliquis presented with a fall.  Patient noted to have recently been hospitalized 7/21-22 for subacute CVA and discharged to [facility].  Patient noted to have had an accident where she fell hurting her right shoulder and right lower extremity.  Patient seen in the ED, imaging done concerning for right femur fracture and right humeral neck fracture.  Orthopedics consulted.   Assessment & Plan:   Principal Problem:   Closed right hip fracture, initial encounter [facility]) Active Problems:   Type 2 diabetes mellitus with hyperglycemia, without long-term current use of insulin (HCC)   Hypertension   Hypothyroidism   Cerebrovascular accident (CVA) (HCC)   Atrial fibrillation/flutter (HCC)   Fall at nursing home, initial encounter   Humeral surgical neck fracture   DNR (do not resuscitate)   Fall  #1 closed right hip fracture -Secondary to mechanical fall at SNF resulting in right hip fracture. -Patient seen in consultation by orthopedics and patient subsequently underwent cephalomedullary nailing of right intertrochanteric femur fracture, 02/06/2023 per orthopedics. -Continue current pain regimen of Tylenol, Robaxin, oxycodone, morphine as needed. -PT/OT. -Will need outpatient follow-up with orthopedics. -Per orthopedics  2.  Right humerus fracture -Patient noted with a right surgical neck humerus fracture, secondary  to mechanical fall. -Patient seen in consult by orthopedics who are recommending conservative management , nonoperative management at this time with sling and NWB. -Outpatient follow-up.  3.  AKI -Patient appears to have CKD stage IIIa. -Stable.  Follow.  4.  Prolonged QTc -Avoid QT prolonging medications such as PPI, nausea medications, SSRIs. -Repeat EKG with resolution of QTc prolongation.   -Keep potassium approximately 4, magnesium approximately 2.   5.  Recent CVA -Patient hospitalized recently earlier on this month for CVA. -Patient with history of new onset A-fib and also at increased risk for stroke recurrence without anticoagulation. -Resume Eliquis today.   6.  Atrial fibrillation -Toprol-XL for rate control.   -Resume Eliquis today.  7.  Hypertension -BP currently controlled on Toprol-XL, amlodipine, hydralazine.   -Continue to hold ARB.    8.  Diabetes mellitus type 2 -Hemoglobin A1c 7.9 (01/27/2023) -CBG 174 this morning. -Continue to hold home regimen oral hypoglycemic agents.  -Continue Semglee 80 units daily, new coverage NovoLog 2 units 3 times daily with meals if patient eats > 50% of meals, SSI.   9.  Anemia, NOS -Status post transfusion 2 units PRBCs. -Hemoglobin currently stable at 9.0 from as low as 7.1. -Follow H&H. -Transfusion threshold hemoglobin < 7.   DVT prophylaxis: Eliquis Code Status: DNR Family Communication: Updated patient.  No family at bedside. Disposition: Likely back to SNF when medically stable hopefully tomorrow.  Status is: Inpatient Remains inpatient appropriate because: Severity of illness   Consultants:  Orthopedics: Dr. Jena Gauss 02/06/2023  Procedures:  CT head CT C-spine 02/05/2023 CT chest abdomen and pelvis 02/05/2023 Cephalomedullary nailing of right intratrochanteric femur fracture per orthopedics: Dr. Jena Gauss 02/06/2023 2 units  PRBCs transfused 02/06/2023  Antimicrobials:  Anti-infectives (From admission, onward)     Start     Dose/Rate Route Frequency Ordered Stop   02/06/23 2000  ceFAZolin (ANCEF) IVPB 2g/100 mL premix        2 g 200 mL/hr over 30 Minutes Intravenous Every 12 hours 02/06/23 1008 02/07/23 0920   02/06/23 0817  vancomycin (VANCOCIN) powder  Status:  Discontinued          As needed 02/06/23 0817 02/06/23 0845   02/06/23 0700  ceFAZolin (ANCEF) IVPB 2g/100 mL premix        2 g 200 mL/hr over 30 Minutes Intravenous On call to O.R. 02/06/23 1610 02/06/23 0823   02/06/23 0626  ceFAZolin (ANCEF) 2-4 GM/100ML-% IVPB       Note to Pharmacy: Gleason, Ginger E: cabinet override      02/06/23 0626 02/07/23 0920         Subjective: Laying in bed listening to church service on her phone.  Denies any chest pain or shortness of breath.  Denies any abdominal pain.  Denies any bleeding.  Feels unable to move right lower extremity as well.  Still with some pain in the right hip however improving.   Objective: Vitals:   02/09/23 0127 02/09/23 0606 02/09/23 0812 02/09/23 1201  BP: (!) 149/102 (!) 145/94 (!) 140/88 131/79  Pulse: 90 91 (!) 101 89  Resp: 18 18 18    Temp: 98.5 F (36.9 C) 98.4 F (36.9 C) 98 F (36.7 C) 98.8 F (37.1 C)  TempSrc: Oral Oral  Oral  SpO2: 98% 100% 100% 99%  Weight:      Height:        Intake/Output Summary (Last 24 hours) at 02/09/2023 1214 Last data filed at 02/09/2023 0830 Gross per 24 hour  Intake 500 ml  Output 1300 ml  Net -800 ml   Filed Weights   02/05/23 0527 02/06/23 0655  Weight: 55 kg 55 kg    Examination:  General exam: NAD. Respiratory system: CTAB anterior lung fields.  No wheezes, no crackles, no rhonchi.  Fair air movement.  Speaking in full sentences.   Cardiovascular system: Regular rate rhythm no murmurs rubs or gallops.  No JVD.  No pitting lower extremity edema. Gastrointestinal system: Abdomen is soft, nontender, nondistended, positive bowel sounds.  No rebound.  No guarding.  Central nervous system: Alert and oriented. No focal  neurological deficits. Extremities: Postop dressing intact on right hip.  Right hip with slight tenderness to palpation.  Right upper extremity in sling. Skin: No rashes, lesions or ulcers Psychiatry: Judgement and insight appear normal. Mood & affect appropriate.     Data Reviewed: I have personally reviewed following labs and imaging studies  CBC: Recent Labs  Lab 02/05/23 0532 02/05/23 0543 02/05/23 2335 02/06/23 1235 02/07/23 0407 02/08/23 0343 02/09/23 0503  WBC 11.3*  --  11.2* 14.3* 10.3 9.0 8.1  NEUTROABS 10.0*  --   --  12.5*  --   --   --   HGB 8.8*   < > 7.1* 11.5* 8.8* 8.8* 9.0*  HCT 27.5*   < > 21.3* 33.1* 25.5* 26.6* 27.7*  MCV 88.1  --  87.3 86.2 84.7 88.7 87.4  PLT 215  --  156 133* 121* 122* 156   < > = values in this interval not displayed.    Basic Metabolic Panel: Recent Labs  Lab 02/05/23 1809 02/05/23 2335 02/06/23 1235 02/07/23 0407 02/08/23 0343 02/09/23 0503  NA 135 135  --  133* 132* 133*  K 3.9 4.6  --  4.1 4.5 4.3  CL 104 102  --  104 105 103  CO2 20* 19*  --  21* 23 22  GLUCOSE 176* 256*  --  195* 205* 179*  BUN 31* 33*  --  35* 36* 23  CREATININE 1.18* 1.11*  --  1.03* 1.00 0.83  CALCIUM 8.7* 8.7*  --  8.0* 8.2* 8.3*  MG  --   --  1.9  --   --  1.7    GFR: Estimated Creatinine Clearance: 34.9 mL/min (by C-G formula based on SCr of 0.83 mg/dL).  Liver Function Tests: Recent Labs  Lab 02/05/23 0532  AST 36  ALT 31  ALKPHOS 43  BILITOT 0.8  PROT 5.4*  ALBUMIN 3.1*    CBG: Recent Labs  Lab 02/08/23 0802 02/08/23 1239 02/08/23 1649 02/08/23 2028 02/09/23 0811  GLUCAP 203* 213* 290* 177* 174*     No results found for this or any previous visit (from the past 240 hour(s)).       Radiology Studies: No results found.      Scheduled Meds:  sodium chloride   Intravenous Once   amLODipine  5 mg Oral Daily   apixaban  2.5 mg Oral BID   cholecalciferol  1,000 Units Oral Daily   docusate sodium  100 mg Oral  BID   feeding supplement  237 mL Oral Q24H   hydrALAZINE  50 mg Oral BID   insulin aspart  0-9 Units Subcutaneous TID WC   insulin aspart  2 Units Subcutaneous TID WC   insulin glargine-yfgn  8 Units Subcutaneous Daily   levothyroxine  88 mcg Oral Daily   metoprolol succinate  25 mg Oral Daily   Continuous Infusions:  methocarbamol (ROBAXIN) IV       LOS: 4 days    Time spent: 35 minutes    Ramiro Harvest, MD [facility]   To contact the attending provider between 7A-7P or the covering provider during after hours 7P-7A, please log into the web site [email or web address] and access using universal  password for that web site. If you do not have the password, please call the hospital operator.  02/09/2023, 12:14 PM

## 2023-02-09 NOTE — Progress Notes (Signed)
Patient Hbg stabilized at 9 today, plan to restart home Eliquis. Ok to discharge to SNF when cleared by medicine.   Janine Ores, PA-C

## 2023-02-10 DIAGNOSIS — I10 Essential (primary) hypertension: Secondary | ICD-10-CM | POA: Diagnosis not present

## 2023-02-10 DIAGNOSIS — Z66 Do not resuscitate: Secondary | ICD-10-CM

## 2023-02-10 DIAGNOSIS — W19XXXD Unspecified fall, subsequent encounter: Secondary | ICD-10-CM

## 2023-02-10 DIAGNOSIS — S72001A Fracture of unspecified part of neck of right femur, initial encounter for closed fracture: Secondary | ICD-10-CM | POA: Diagnosis not present

## 2023-02-10 DIAGNOSIS — I4891 Unspecified atrial fibrillation: Secondary | ICD-10-CM | POA: Diagnosis not present

## 2023-02-10 DIAGNOSIS — I6310 Cerebral infarction due to embolism of unspecified precerebral artery: Secondary | ICD-10-CM | POA: Diagnosis not present

## 2023-02-10 LAB — GLUCOSE, CAPILLARY
Glucose-Capillary: 172 mg/dL — ABNORMAL HIGH (ref 70–99)
Glucose-Capillary: 186 mg/dL — ABNORMAL HIGH (ref 70–99)
Glucose-Capillary: 239 mg/dL — ABNORMAL HIGH (ref 70–99)
Glucose-Capillary: 251 mg/dL — ABNORMAL HIGH (ref 70–99)
Glucose-Capillary: 105 mg/dL — ABNORMAL HIGH (ref 70–99)

## 2023-02-10 MED ORDER — POLYETHYLENE GLYCOL 3350 17 G PO PACK: 17.0000 g | PACK | Freq: Every day | ORAL | 0 refills | Status: DC | PRN

## 2023-02-10 NOTE — Plan of Care (Signed)

## 2023-02-10 NOTE — TOC Progression Note (Signed)
Transition of Care [facility]) - Progression Note    Patient Details  Name: Barbara Butler MRN: [id number] Date of Birth: 02/13/1930  Transition of Care [facility]) CM/SW Contact   A [place], [place] Phone Number: 02/10/2023, 12:12 PM  Clinical Narrative:     CSW started auth for pt as she is medically stable for discharge. [facility] able to take pt today if auth approved.   Auth status pending.   TOC will continue to follow.   Expected Discharge Plan: Skilled Nursing Facility Barriers to Discharge: Continued Medical Work up  Expected Discharge Plan and Services   Discharge Planning Services: CM Consult Post Acute Care Choice: Skilled Nursing Facility Living arrangements for the past 2 months: Single Family Home                                       Social Determinants of Health (SDOH) Interventions SDOH Screenings   Food Insecurity: No Food Insecurity (02/05/2023)  Housing: Low Risk  (02/05/2023)  Transportation Needs: No Transportation Needs (02/05/2023)  Utilities: Not At Risk (02/05/2023)  Tobacco Use: Medium Risk (02/05/2023)    Readmission Risk Interventions    02/07/2023    3:03 PM  Readmission Risk Prevention Plan  Transportation Screening Complete  PCP or Specialist Appt within 5-7 Days Complete  Home Care Screening Complete  Medication Review (RN CM) Complete

## 2023-02-10 NOTE — Care Management Important Message (Signed)
Important Message  Patient Details  Name: Barbara Butler MRN: [id number] Date of Birth: 04-16-1930   Medicare Important Message Given:        Dorena Bodo 02/10/2023, 3:21 PM

## 2023-02-10 NOTE — Plan of Care (Signed)
  Problem: Education: Goal: Knowledge of disease or condition will improve Outcome: Progressing   Problem: Coping: Goal: Will verbalize positive feelings about self Outcome: Progressing   Problem: Nutrition: Goal: Risk of aspiration will decrease Outcome: Progressing

## 2023-02-10 NOTE — Progress Notes (Signed)
PROGRESS NOTE    Barbara Butler  WGN:[id number] DOB: 12-16-1929 DOA: 02/05/2023 PCP: [name], MD    Chief Complaint  Patient presents with   Fall    Patient to ED via EMS with complaint of mechanical fall around 1900 last night. Patient takes eliquis. Patient unable to ambulate after fall. Patient independent ADL at baseline. EMS reports patient states right shoulder pain and right hip pain. EMS reports right leg shortening and rotation.    Brief Narrative:  Patient is a 89 year old female history of diabetes, hypertension, hypothyroidism, recent CVA, A-fib on Eliquis presented with a fall.  Patient noted to have recently been hospitalized 7/21-22 for subacute CVA and discharged to [facility].  Patient noted to have had an accident where she fell hurting her right shoulder and right lower extremity.  Patient seen in the ED, imaging done concerning for right femur fracture and right humeral neck fracture.  Orthopedics consulted.   Assessment & Plan:   Principal Problem:   Closed right hip fracture, initial encounter [facility]) Active Problems:   Type 2 diabetes mellitus with hyperglycemia, without long-term current use of insulin (HCC)   Hypertension   Hypothyroidism   Cerebrovascular accident (CVA) (HCC)   Atrial fibrillation/flutter (HCC)   Fall at nursing home, initial encounter   Humeral surgical neck fracture   DNR (do not resuscitate)   Fall  #1 closed right hip fracture -Secondary to mechanical fall at SNF resulting in right hip fracture. -Patient seen in consultation by orthopedics and patient subsequently underwent cephalomedullary nailing of right intertrochanteric femur fracture, 02/06/2023 per orthopedics. -Continue current pain regimen of Tylenol, Robaxin, oxycodone, morphine as needed. -PT/OT. -Will need outpatient follow-up with orthopedics. -Per orthopedics  2.  Right humerus fracture -Patient noted with a right surgical neck humerus fracture, secondary  to mechanical fall. -Patient seen in consult by orthopedics who are recommending conservative management , nonoperative management at this time with sling and NWB. -Outpatient follow-up.  3.  AKI -Patient appears to have CKD stage IIIa. -Renal function stable. -Follow.  4.  Prolonged QTc -Avoid QT prolonging medications such as PPI, nausea medications, SSRIs. -Repeat EKG with resolution of QTc prolongation.   -Keep potassium approximately 4, magnesium approximately 2.   5.  Recent CVA -Patient hospitalized recently earlier on this month for CVA. -Patient with history of new onset A-fib and also at increased risk for stroke recurrence without anticoagulation. -Eliquis resumed 02/09/2023.  6.  Atrial fibrillation -Continue Toprol-XL for rate control.   -Eliquis for anticoagulation.    7.  Hypertension -BP currently controlled on Toprol-XL, amlodipine, hydralazine.   -Continue to hold ARB.    8.  Diabetes mellitus type 2 -Hemoglobin A1c 7.9 (01/27/2023) -CBG 172 this morning. -Continue to hold home regimen oral hypoglycemic agents.  -Continue Semglee 8 units daily, meal coverage NovoLog 2 units 3 times daily with meals if patient eats > 50% of meals, SSI.  -Likely resume home regimen of oral hypoglycemic agents on discharge.  9.  Anemia, NOS -Status post transfusion 2 units PRBCs. -Hemoglobin currently stable at 9.6 from as low as 7.1. -Follow H&H. -Transfusion threshold hemoglobin < 7.   DVT prophylaxis: Eliquis Code Status: DNR Family Communication: Updated patient.  No family at bedside. Disposition: Patient currently medically stable as of 02/10/2023.  Awaiting SNF placement.    Status is: Inpatient Remains inpatient appropriate because: Severity of illness   Consultants:  Orthopedics: Dr. Jena Gauss 02/06/2023  Procedures:  CT head CT C-spine 02/05/2023 CT chest abdomen  and pelvis 02/05/2023 Cephalomedullary nailing of right intratrochanteric femur fracture per  orthopedics: Dr. Jena Gauss 02/06/2023 2 units PRBCs transfused 02/06/2023  Antimicrobials:  Anti-infectives (From admission, onward)    Start     Dose/Rate Route Frequency Ordered Stop   02/06/23 2000  ceFAZolin (ANCEF) IVPB 2g/100 mL premix        2 g 200 mL/hr over 30 Minutes Intravenous Every 12 hours 02/06/23 1008 02/07/23 0920   02/06/23 0817  vancomycin (VANCOCIN) powder  Status:  Discontinued          As needed 02/06/23 0817 02/06/23 0845   02/06/23 0700  ceFAZolin (ANCEF) IVPB 2g/100 mL premix        2 g 200 mL/hr over 30 Minutes Intravenous On call to O.R. 02/06/23 1914 02/06/23 0823   02/06/23 0626  ceFAZolin (ANCEF) 2-4 GM/100ML-% IVPB       Note to Pharmacy: Gleason, Ginger E: cabinet override      02/06/23 0626 02/07/23 0920         Subjective: Laying in bed.  No chest pain, no shortness of breath, no abdominal pain.  Right hip pain improving however patient states difficult to move around to ambulate.  Patient feels some swelling around the right hip.  Denies any bleeding.    Objective: Vitals:   02/09/23 1558 02/09/23 2025 02/10/23 0502 02/10/23 0815  BP: 118/72 139/84 (!) 146/82 136/87  Pulse: 92 99 88 91  Resp: 18 18 18    Temp: 98 F (36.7 C) 98.1 F (36.7 C) 97.6 F (36.4 C) 99.3 F (37.4 C)  TempSrc:  Oral Oral   SpO2: 97% 99% 100% 100%  Weight:      Height:        Intake/Output Summary (Last 24 hours) at 02/10/2023 1143 Last data filed at 02/09/2023 1700 Gross per 24 hour  Intake 350 ml  Output --  Net 350 ml   Filed Weights   02/05/23 0527 02/06/23 0655  Weight: 55 kg 55 kg    Examination:  General exam: NAD. Respiratory system: Lungs clear to auscultation bilaterally anterior lung fields.  No wheezes, no crackles, no rhonchi.  Fair air movement.  Speaking in full sentences.  Cardiovascular system: RRR no murmurs rubs or gallops.  No JVD.  No pitting lower extremity edema.  Gastrointestinal system: Abdomen is soft, nontender, nondistended,  positive bowel sounds.  No rebound.  No guarding.  Central nervous system: Alert and oriented. No focal neurological deficits. Extremities: Postop dressing intact on right hip.  Right hip with slight tenderness to palpation.  Right upper extremity in sling. Skin: No rashes, lesions or ulcers Psychiatry: Judgement and insight appear normal. Mood & affect appropriate.     Data Reviewed: I have personally reviewed following labs and imaging studies  CBC: Recent Labs  Lab 02/05/23 0532 02/05/23 0543 02/06/23 1235 02/07/23 0407 02/08/23 0343 02/09/23 0503 02/10/23 0254  WBC 11.3*   < > 14.3* 10.3 9.0 8.1 9.3  NEUTROABS 10.0*  --  12.5*  --   --   --   --   HGB 8.8*   < > 11.5* 8.8* 8.8* 9.0* 9.6*  HCT 27.5*   < > 33.1* 25.5* 26.6* 27.7* 28.6*  MCV 88.1   < > 86.2 84.7 88.7 87.4 88.3  PLT 215   < > 133* 121* 122* 156 181   < > = values in this interval not displayed.    Basic Metabolic Panel: Recent Labs  Lab 02/05/23 2335 02/06/23 1235 02/07/23  0407 02/08/23 0343 02/09/23 0503 02/10/23 0254  NA 135  --  133* 132* 133* 132*  K 4.6  --  4.1 4.5 4.3 4.3  CL 102  --  104 105 103 102  CO2 19*  --  21* 23 22 22   GLUCOSE 256*  --  195* 205* 179* 185*  BUN 33*  --  35* 36* 23 25*  CREATININE 1.11*  --  1.03* 1.00 0.83 0.87  CALCIUM 8.7*  --  8.0* 8.2* 8.3* 8.2*  MG  --  1.9  --   --  1.7 2.1    GFR: Estimated Creatinine Clearance: 33.3 mL/min (by C-G formula based on SCr of 0.87 mg/dL).  Liver Function Tests: Recent Labs  Lab 02/05/23 0532  AST 36  ALT 31  ALKPHOS 43  BILITOT 0.8  PROT 5.4*  ALBUMIN 3.1*    CBG: Recent Labs  Lab 02/09/23 1558 02/09/23 2022 02/10/23 0503 02/10/23 0836 02/10/23 1123  GLUCAP 147* 188* 172* 186* 239*     No results found for this or any previous visit (from the past 240 hour(s)).       Radiology Studies: No results found.      Scheduled Meds:  sodium chloride   Intravenous Once   amLODipine  5 mg Oral Daily    apixaban  2.5 mg Oral BID   cholecalciferol  1,000 Units Oral Daily   docusate sodium  100 mg Oral BID   feeding supplement  237 mL Oral Q24H   hydrALAZINE  50 mg Oral BID   insulin aspart  0-9 Units Subcutaneous TID WC   insulin aspart  2 Units Subcutaneous TID WC   insulin glargine-yfgn  8 Units Subcutaneous Daily   levothyroxine  88 mcg Oral Daily   metoprolol succinate  25 mg Oral Daily   Continuous Infusions:  methocarbamol (ROBAXIN) IV       LOS: 5 days    Time spent: 35 minutes    Ramiro Harvest, MD [facility]   To contact the attending provider between 7A-7P or the covering provider during after hours 7P-7A, please log into the web site [email or web address] and access using universal [place] password for that web site. If you do not have the password, please call the hospital operator.  02/10/2023, 11:43 AM

## 2023-02-11 DIAGNOSIS — S72001A Fracture of unspecified part of neck of right femur, initial encounter for closed fracture: Secondary | ICD-10-CM | POA: Diagnosis not present

## 2023-02-11 DIAGNOSIS — I10 Essential (primary) hypertension: Secondary | ICD-10-CM | POA: Diagnosis not present

## 2023-02-11 DIAGNOSIS — I4891 Unspecified atrial fibrillation: Secondary | ICD-10-CM | POA: Diagnosis not present

## 2023-02-11 DIAGNOSIS — I6310 Cerebral infarction due to embolism of unspecified precerebral artery: Secondary | ICD-10-CM | POA: Diagnosis not present

## 2023-02-11 LAB — GLUCOSE, CAPILLARY
Glucose-Capillary: 170 mg/dL — ABNORMAL HIGH (ref 70–99)
Glucose-Capillary: 209 mg/dL — ABNORMAL HIGH (ref 70–99)

## 2023-02-11 MED ORDER — METHOCARBAMOL 500 MG PO TABS: 500.0000 mg | ORAL_TABLET | Freq: Four times a day (QID) | ORAL | Status: DC | PRN

## 2023-02-11 MED ORDER — ACETAMINOPHEN 325 MG PO TABS: 325.0000 mg | ORAL_TABLET | Freq: Four times a day (QID) | ORAL | Status: DC | PRN

## 2023-02-11 MED ORDER — ENSURE ENLIVE PO LIQD: 237.0000 mL | ORAL | Status: DC

## 2023-02-11 MED ORDER — OXYCODONE HCL 5 MG PO TABS: 2.5000 mg | ORAL_TABLET | ORAL | 0 refills | Status: DC | PRN

## 2023-02-11 NOTE — Discharge Summary (Signed)
Physician Discharge Summary  Barbara Butler UEA:[id number] DOB: 1929-09-21 DOA: 02/05/2023  PCP: [name], MD  Admit date: 02/05/2023 Discharge date: 02/11/2023  Time spent: 60 minutes  Recommendations for Outpatient Follow-up:  Follow-up with Dr. Jena Gauss, orthopedics in 2 weeks. Follow-up with MD at SNF.  On follow-up patient will need a basic metabolic profile done in 1 week to follow-up on electrolytes and renal function as well as a CBC.  Patient's blood pressure need to be reassessed as patient's ARB was discontinued during this hospitalization.   Discharge Diagnoses:  Principal Problem:   Closed right hip fracture, initial encounter [facility]) Active Problems:   Type 2 diabetes mellitus with hyperglycemia, without long-term current use of insulin (HCC)   Hypertension   Hypothyroidism   Cerebrovascular accident (CVA) (HCC)   Atrial fibrillation/flutter (HCC)   Fall at nursing home, initial encounter   Humeral surgical neck fracture   DNR (do not resuscitate)   Fall   Discharge Condition: Stable and improved.  Diet recommendation: Heart healthy  Filed Weights   02/05/23 0527 02/06/23 0655 02/11/23 0500  Weight: 55 kg 55 kg 55.5 kg    History of present illness:  HPI per Dr. Stefano Gaul is a 89 y.o. female with medical history significant of DM, HTN, hypothyroidism, recent CVA, and afib on Eliquis presenting with a fall.  She was last hospitalized from 7/21-22 for subacute CVA and was discharged to [facility].  She reports an accident yesterday - she fell and hurt her R shoulder and R leg.  She was diagnosed with afib during last hospitalization, started on Eliquis.  She was feeling well before the fall.  She was not light-headed or dizzy.  Rehab was going very well.  She did have generalized weakness following the stroke but no focal deficits.    Hospital Course:  #1 closed right hip fracture -Secondary to mechanical fall at SNF resulting in right hip  fracture. -Patient seen in consultation by orthopedics and patient subsequently underwent cephalomedullary nailing of right intertrochanteric femur fracture, 02/06/2023 per orthopedics. -During the hospitalization patient was maintained on pain regimen of Tylenol, Robaxin, oxycodone, morphine as needed. -PT/OT followed patient throughout her hospitalization postoperatively. -Patient cleared by orthopedics for discharge with close outpatient follow-up.   2.  Right humerus fracture -Patient noted with a right surgical neck humerus fracture, secondary to mechanical fall. -Patient seen in consult by orthopedics who are recommending conservative management , nonoperative management at this time with sling and NWB. -Outpatient follow-up with orthopedics..   3.  AKI -Patient appears to have CKD stage IIIa. -Renal function improved and had stabilized by day of discharge.    4.  Prolonged QTc -Avoid QT prolonging medications such as PPI, nausea medications, SSRIs. -Repeat EKG with resolution of QTc prolongation.   -Electrolytes were repleted and potassium Approximately at 4, magnesium at 2.   -Outpatient follow-up.   5.  Recent CVA -Patient hospitalized recently earlier on this month for CVA. -Patient with history of new onset A-fib and also at increased risk for stroke recurrence without anticoagulation. -Eliquis was held preoperatively. -Eliquis resumed 02/09/2023. -Outpatient follow-up with neurology as previously scheduled.   6.  Atrial fibrillation -Maintained on Toprol-XL for rate control.   -Eliquis was initially held preoperatively and postoperatively once hemoglobin stabilized Eliquis was resumed.  Patient had no overt bleeding noted.   -Outpatient follow-up.    7.  Hypertension -BP was controlled during the hospitalization on patient's Toprol-XL, amlodipine and hydralazine.   -  ARB was held during the hospitalization will be discontinued on discharge.   -Outpatient follow-up.    8.   Diabetes mellitus type 2 -Hemoglobin A1c 7.9 (01/27/2023) -Patient's home oral hypoglycemic agents were held during the hospitalization and patient's blood glucose was controlled on Semglee 8 units daily, NovoLog 2 units 3 times daily with meals, SSI.   -Home regimen of oral hypoglycemic agents will be resumed on discharge.    9.  Anemia, NOS -Status post transfusion 2 units PRBCs. -Hemoglobin stabilized at 9.4 by day of discharge.   -Outpatient follow-up.       Procedures: CT head CT C-spine 02/05/2023 CT chest abdomen and pelvis 02/05/2023 Cephalomedullary nailing of right intratrochanteric femur fracture per orthopedics: Dr. Jena Gauss 02/06/2023 2 units PRBCs transfused 02/06/2023    Consultations: Orthopedics: Dr. Jena Gauss 02/06/2023   Discharge Exam: Vitals:   02/11/23 0457 02/11/23 0817  BP: 137/85 (!) 161/83  Pulse: 100   Resp: 16   Temp: 98.5 F (36.9 C) 99.6 F (37.6 C)  SpO2: 100% 100%    General: NAD Cardiovascular: Irregularly irregular.  No JVD, no murmurs rubs or gallops.  No pitting lower extremity edema.  Respiratory: CTAB anterior lung fields.  No wheezes, no crackles, no rhonchi.  Fair air movement.  Speaking in full sentences.  Discharge Instructions   Discharge Instructions     Diet - low sodium heart healthy   Complete by: As directed    Increase activity slowly   Complete by: As directed       Allergies as of 02/11/2023   No Known Allergies      Medication List     STOP taking these medications    aspirin EC 81 MG tablet   olmesartan 40 MG tablet Commonly known as: BENICAR       TAKE these medications    acetaminophen 325 MG tablet Commonly known as: TYLENOL Take 1-2 tablets (325-650 mg total) by mouth every 6 (six) hours as needed for mild pain (pain score 1-3 or temp > 100.5).   amLODipine 5 MG tablet Commonly known as: NORVASC Take 5 mg by mouth daily.   apixaban 2.5 MG Tabs tablet Commonly known as: Eliquis Take 1 tablet (2.5  mg total) by mouth 2 (two) times daily.   cholecalciferol 25 MCG (1000 UNIT) tablet Commonly known as: VITAMIN D3 Take 1,000 Units by mouth daily at 12 noon.   feeding supplement Liqd Take 237 mLs by mouth daily.   hydrALAZINE 50 MG tablet Commonly known as: APRESOLINE Take 50 mg by mouth 2 (two) times a day.   levothyroxine 88 MCG tablet Commonly known as: SYNTHROID Take 88 mcg by mouth daily.   MEGARED OMEGA-3 KRILL OIL PO Take 750 mg by mouth daily at 12 noon.   metFORMIN 500 MG tablet Commonly known as: GLUCOPHAGE Take 500 mg by mouth 2 (two) times daily.   methocarbamol 500 MG tablet Commonly known as: ROBAXIN Take 1 tablet (500 mg total) by mouth every 6 (six) hours as needed for muscle spasms.   metoprolol succinate 25 MG 24 hr tablet Commonly known as: TOPROL-XL Take 1 tablet by mouth daily.   oxyCODONE 5 MG immediate release tablet Commonly known as: Oxy IR/ROXICODONE Take 0.5 tablets (2.5 mg total) by mouth every 4 (four) hours as needed for breakthrough pain ((for MODERATE breakthrough pain)).   polyethylene glycol 17 g packet Commonly known as: MIRALAX / GLYCOLAX Take 17 g by mouth daily as needed for mild constipation.   vitamin  C 1000 MG tablet Take 1,000 mg by mouth daily at 12 noon.       No Known Allergies  Follow-up Information     Haddix, Gillie Manners, MD. Schedule an appointment as soon as possible for a visit in 2 week(s).   Specialty: Orthopedic Surgery Why: For wound check and repeat x-rays Contact information: [address] [place] [address] [phone]         MD at SNF Follow up.                   The results of significant diagnostics from this hospitalization (including imaging, microbiology, ancillary and laboratory) are listed below for reference.    Significant Diagnostic Studies: DG HIP PORT UNILAT W OR W/O PELVIS 1V RIGHT  Result Date: 02/06/2023 CLINICAL DATA:  Intramedullary nail fixation of the  proximal right femur. EXAM: DG HIP (WITH OR WITHOUT PELVIS) 1V PORT RIGHT COMPARISON:  Hip radiographs 1 day prior. FINDINGS: Postsurgical changes reflecting interval intramedullary nail fixation of the proximal right femoral fracture is seen. Hardware alignment is within expected limits, without evidence of immediate complication. Fracture alignment is improved. Femoroacetabular alignment is maintained. There is expected surrounding postoperative soft tissue gas. Heterotopic ossification about the proximal left femur is unchanged. IMPRESSION: Status post intramedullary nail fixation of the proximal right femoral fracture without evidence of complication. Electronically Signed   By: Lesia Hausen M.D.   On: 02/06/2023 12:28   DG FEMUR, MIN 2 VIEWS RIGHT  Result Date: 02/06/2023 CLINICAL DATA:  Intramedullary nail in the right femur. EXAM: RIGHT FEMUR 2 VIEWS COMPARISON:  Hip radiographs 1 day prior FINDINGS: Six C-arm fluoroscopic images were obtained intraoperatively and submitted for post operative interpretation. Intraoperative images during intramedullary nail fixation of the proximal right femoral fracture are seen. Hardware alignment is within expected limits on the final images, without evidence of immediate complication. Fluoro time 48.7 seconds, dose 4.91 mGy. Please see the performing provider's procedural report for further detail. IMPRESSION: Intraoperative images during intramedullary nail fixation of the proximal right femur as above. Electronically Signed   By: Lesia Hausen M.D.   On: 02/06/2023 12:26   DG C-Arm 1-60 Min-No Report  Result Date: 02/06/2023 Fluoroscopy was utilized by the requesting physician.  No radiographic interpretation.   CT CHEST ABDOMEN PELVIS W CONTRAST  Result Date: 02/05/2023 CLINICAL DATA:  One poly trauma. EXAM: CT CHEST, ABDOMEN, AND PELVIS WITH CONTRAST TECHNIQUE: Multidetector CT imaging of the chest, abdomen and pelvis was performed following the standard  protocol during bolus administration of intravenous contrast. RADIATION DOSE REDUCTION: This exam was performed according to the departmental dose-optimization program which includes automated exposure control, adjustment of the mA and/or kV according to patient size and/or use of iterative reconstruction technique. CONTRAST:  75mL OMNIPAQUE IOHEXOL 350 MG/ML SOLN COMPARISON:  None Available. FINDINGS: CT CHEST FINDINGS Cardiovascular: Biatrial enlargement. No pericardial effusion. No evidence of great vessel injury. Extensive atheromatous plaque affecting the aorta and great vessels. Mediastinum/Nodes: No hematoma or pneumomediastinum Lungs/Pleura: No hemothorax, pneumothorax, or pulmonary contusion. Mild subpleural reticulation/scarring. Musculoskeletal: Impacted right humeral neck fracture also involving the lesser tuberosity. There is a lucency at the posterior glenoid on the right which appears corticated and chronic. Right glenohumeral osteoarthritis. Hemorrhage/edema involves the right upper extremity soft tissues around the fracture. CT ABDOMEN PELVIS FINDINGS Hepatobiliary: No hepatic injury or perihepatic hematoma. Elongated calcified stone in the gallbladder. Thickening with some cystic change at the fundus of the gallbladder, usually adenomyomatosis. Liver cyst  measuring up to 4.2 cm in the left lobe. Pancreas: No acute finding. Diffuse dilatation of the main duct measuring up to 6 mm, new from 2013. No underlying mass or cyst is seen. Generalized pancreatic atrophy. Spleen: No splenic injury or perisplenic hematoma. Adrenals/Urinary Tract: No adrenal hemorrhage or renal injury identified. There is a wedge-shaped area of hypoenhancing cortex at the upper pole left kidney measuring up to 3 cm on the delayed phase, without distortion of the capsule. Renal cysts on both sides measuring up to 4 cm. Bladder is unremarkable. Stomach/Bowel: No evidence of injury. Innumerable distal colonic diverticula.  Vascular/Lymphatic: Atheromatous plaque.  No acute vascular finding. Reproductive: Unremarkable for age Other: No ascites or pneumoperitoneum. Fatty midline hernia at the umbilicus. Musculoskeletal: Acute intertrochanteric right femur fracture with extensive swelling in the upper right thigh. 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 Chronic endplate/compression fractures affecting L1, L3, and L5. IMPRESSION: 1. Acute fractures of the proximal right humeral neck and intertrochanteric right femur. 2. No intrathoracic or intra-abdominal injury is seen. 3. Diffuse pancreatic ductal dilatation since 2013. No visible underlying mass, consider pancreas MRI follow-up if appropriate for comorbidities. 4. Wedge-shaped area heterogeneous/hypoenhancement in the left upper pole kidney, morphology favoring infarct over infection or lesion. Attention at any follow-up. 5. Numerous chronic findings are described above. Electronically Signed   By: Tiburcio Pea M.D.   On: 02/05/2023 06:40   DG Chest Port 1 View  Result Date: 02/05/2023 CLINICAL DATA:  Fall with femur fracture. EXAM: PORTABLE CHEST 1 VIEW COMPARISON:  01/26/2023 FINDINGS: Right humeral neck fracture, there is pending shoulder radiograph. Normal heart size and mediastinal contours. Mitral annular calcification. There is no edema, consolidation, effusion, or pneumothorax. Artifact from EKG leads. IMPRESSION: 1. No evidence of acute cardiopulmonary disease. 2. Right proximal humerus fracture. Electronically Signed   By: Tiburcio Pea M.D.   On: 02/05/2023 06:24   DG Hip Unilat W or Wo Pelvis 2-3 Views Right  Result Date: 02/05/2023 CLINICAL DATA:  Fall with right hip pain EXAM: DG HIP (WITH OR WITHOUT PELVIS) 3V RIGHT COMPARISON:  None Available. FINDINGS: Acute intertrochanteric right femur fracture with comminution and varus deformity. Both hips are located. There is pelvic bone osteopenia without acute fracture. Remote and nonunited left  greater trochanter fracture with corticated margins. Degenerative spurring at both sacroiliac joints. IMPRESSION: 1. Acute intertrochanteric right femur fracture. 2. Chronic left greater trochanter femur fracture. Electronically Signed   By: Tiburcio Pea M.D.   On: 02/05/2023 06:23   DG Shoulder Right  Result Date: 02/05/2023 CLINICAL DATA:  Status post fall.  Complains of right shoulder pain. EXAM: RIGHT SHOULDER - 2+ VIEW COMPARISON:  None Available. FINDINGS: There is an acute, impacted, comminuted fracture deformity involving the surgical neck of the humerus. No signs of dislocation. IMPRESSION: Acute, impacted, comminuted fracture deformity involving the surgical neck of the humerus. Electronically Signed   By: Signa Kell M.D.   On: 02/05/2023 06:22   CT Head Wo Contrast  Result Date: 02/05/2023 CLINICAL DATA:  Neck trauma.  Minor head trauma EXAM: CT HEAD WITHOUT CONTRAST CT CERVICAL SPINE WITHOUT CONTRAST TECHNIQUE: Multidetector CT imaging of the head and cervical spine was performed following the standard protocol without intravenous contrast. Multiplanar CT image reconstructions of the cervical spine were also generated. RADIATION DOSE REDUCTION: This exam was performed according to the departmental dose-optimization program which includes automated exposure control, adjustment of the mA and/or kV according to patient size and/or use of iterative reconstruction technique. COMPARISON:  01/26/2023 FINDINGS: CT HEAD FINDINGS Brain: No evidence of acute infarction, hemorrhage, hydrocephalus, extra-axial collection or mass lesion/mass effect. Generalized atrophy. Fairly extensive chronic small vessel ischemic low-density in the cerebral white matter and deep gray nuclei. Vascular: No hyperdense vessel or unexpected calcification. Skull: No acute finding. Small inner table osteoma at the left middle cranial fossa. Sinuses/Orbits: No evidence of injury CT CERVICAL SPINE FINDINGS Alignment: Normal.  Skull base and vertebrae: No acute fracture. No primary bone lesion or focal pathologic process. Soft tissues and spinal canal: No prevertebral fluid or swelling. No visible canal hematoma. Disc levels:  Ordinary cervical spine degeneration. Upper chest: Clear apical lungs IMPRESSION: No evidence of acute intracranial or cervical spine injury. Electronically Signed   By: Tiburcio Pea M.D.   On: 02/05/2023 06:09   CT Cervical Spine Wo Contrast  Result Date: 02/05/2023 CLINICAL DATA:  Neck trauma.  Minor head trauma EXAM: CT HEAD WITHOUT CONTRAST CT CERVICAL SPINE WITHOUT CONTRAST TECHNIQUE: Multidetector CT imaging of the head and cervical spine was performed following the standard protocol without intravenous contrast. Multiplanar CT image reconstructions of the cervical spine were also generated. RADIATION DOSE REDUCTION: This exam was performed according to the departmental dose-optimization program which includes automated exposure control, adjustment of the mA and/or kV according to patient size and/or use of iterative reconstruction technique. COMPARISON:  01/26/2023 FINDINGS: CT HEAD FINDINGS Brain: No evidence of acute infarction, hemorrhage, hydrocephalus, extra-axial collection or mass lesion/mass effect. Generalized atrophy. Fairly extensive chronic small vessel ischemic low-density in the cerebral white matter and deep gray nuclei. Vascular: No hyperdense vessel or unexpected calcification. Skull: No acute finding. Small inner table osteoma at the left middle cranial fossa. Sinuses/Orbits: No evidence of injury CT CERVICAL SPINE FINDINGS Alignment: Normal. Skull base and vertebrae: No acute fracture. No primary bone lesion or focal pathologic process. Soft tissues and spinal canal: No prevertebral fluid or swelling. No visible canal hematoma. Disc levels:  Ordinary cervical spine degeneration. Upper chest: Clear apical lungs IMPRESSION: No evidence of acute intracranial or cervical spine injury.  Electronically Signed   By: Tiburcio Pea M.D.   On: 02/05/2023 06:09   ECHOCARDIOGRAM COMPLETE  Result Date: 01/27/2023    ECHOCARDIOGRAM REPORT   Patient Name:   Barbara Butler Date of Exam: 01/27/2023 Medical Rec #:  [id number]        Height:       62.0 in Accession #:    [id number]       Weight:       130.0 lb Date of Birth:  02-14-1930         BSA:          1.592 m Patient Age:    89 years         BP:           164/102 mmHg Patient Gender: F                HR:           87 bpm. Exam Location:  [facility] Procedure: 2D Echo, Cardiac Doppler and Color Doppler Indications:     Stroke  History:         Patient has no prior history of Echocardiogram examinations.                  Stroke; Risk Factors:Hypertension and Diabetes.  Sonographer:     Mikki Harbor Referring Phys:  [id number] Eliezer Mccoy PATEL Diagnosing Phys: Lorine Bears MD IMPRESSIONS  1. Left  ventricular ejection fraction, by estimation, is 70 to 75%. The left ventricle has hyperdynamic function. The left ventricle has no regional wall motion abnormalities. There is moderate asymmetric left ventricular hypertrophy of the basal-septal  segment. Left ventricular diastolic parameters are indeterminate.  2. Right ventricular systolic function is normal. The right ventricular size is normal. There is normal pulmonary artery systolic pressure.  3. Left atrial size was severely dilated.  4. Right atrial size was moderately dilated.  5. The mitral valve is degenerative. Mild to moderate mitral valve regurgitation. No evidence of mitral stenosis. Moderate mitral annular calcification.  6. Tricuspid valve regurgitation is moderate.  7. The aortic valve is calcified. Aortic valve regurgitation is mild. Aortic valve sclerosis/calcification is present, without any evidence of aortic stenosis.  8. The inferior vena cava is normal in size with greater than 50% respiratory variability, suggesting right atrial pressure of 3 mmHg. FINDINGS  Left Ventricle: Left ventricular  ejection fraction, by estimation, is 70 to 75%. The left ventricle has hyperdynamic function. The left ventricle has no regional wall motion abnormalities. The left ventricular internal cavity size was normal in size. There is moderate asymmetric left ventricular hypertrophy of the basal-septal segment. Left ventricular diastolic parameters are indeterminate. Right Ventricle: The right ventricular size is normal. No increase in right ventricular wall thickness. Right ventricular systolic function is normal. There is normal pulmonary artery systolic pressure. The tricuspid regurgitant velocity is 2.17 m/s, and  with an assumed right atrial pressure of 5 mmHg, the estimated right ventricular systolic pressure is 23.8 mmHg. Left Atrium: Left atrial size was severely dilated. Right Atrium: Right atrial size was moderately dilated. Pericardium: There is no evidence of pericardial effusion. Mitral Valve: The mitral valve is degenerative in appearance. There is moderate thickening of the mitral valve leaflet(s). There is moderate calcification of the mitral valve leaflet(s). Moderate mitral annular calcification. Mild to moderate mitral valve regurgitation. No evidence of mitral valve stenosis. MV peak gradient, 5.3 mmHg. The mean mitral valve gradient is 2.0 mmHg. Tricuspid Valve: The tricuspid valve is normal in structure. Tricuspid valve regurgitation is moderate . No evidence of tricuspid stenosis. Aortic Valve: The aortic valve is calcified. Aortic valve regurgitation is mild. Aortic regurgitation PHT measures 508 msec. Aortic valve sclerosis/calcification is present, without any evidence of aortic stenosis. Aortic valve mean gradient measures 2.5  mmHg. Aortic valve peak gradient measures 6.0 mmHg. Aortic valve area, by VTI measures 2.16 cm. Pulmonic Valve: The pulmonic valve was normal in structure. Pulmonic valve regurgitation is mild. No evidence of pulmonic stenosis. Aorta: The aortic root is normal in size and  structure. Venous: The inferior vena cava is normal in size with greater than 50% respiratory variability, suggesting right atrial pressure of 3 mmHg. IAS/Shunts: No atrial level shunt detected by color flow Doppler.  LEFT VENTRICLE PLAX 2D LVIDd:         3.80 cm LVIDs:         1.80 cm LV PW:         0.80 cm LV IVS:        1.20 cm LVOT diam:     1.90 cm LV SV:         37 LV SV Index:   23 LVOT Area:     2.84 cm  RIGHT VENTRICLE RV Basal diam:  3.15 cm RV Mid diam:    2.90 cm RV S prime:     11.50 cm/s LEFT ATRIUM  Index        RIGHT ATRIUM           Index LA diam:        4.50 cm  2.83 cm/m   RA Area:     24.20 cm LA Vol (A2C):   124.0 ml 77.90 ml/m  RA Volume:   71.80 ml  45.10 ml/m LA Vol (A4C):   92.5 ml  58.11 ml/m LA Biplane Vol: 108.0 ml 67.84 ml/m  AORTIC VALVE                    PULMONIC VALVE AV Area (Vmax):    2.04 cm     PV Vmax:       0.80 m/s AV Area (Vmean):   2.11 cm     PV Peak grad:  2.6 mmHg AV Area (VTI):     2.16 cm AV Vmax:           122.50 cm/s AV Vmean:          69.200 cm/s AV VTI:            0.172 m AV Peak Grad:      6.0 mmHg AV Mean Grad:      2.5 mmHg LVOT Vmax:         88.20 cm/s LVOT Vmean:        51.400 cm/s LVOT VTI:          0.132 m LVOT/AV VTI ratio: 0.76 AI PHT:            508 msec  AORTA Ao Root diam: 3.00 cm Ao Asc diam:  3.30 cm MITRAL VALVE               TRICUSPID VALVE MV Area (PHT): 4.21 cm    TR Peak grad:   18.8 mmHg MV Area VTI:   1.92 cm    TR Vmax:        217.00 cm/s MV Peak grad:  5.3 mmHg MV Mean grad:  2.0 mmHg    SHUNTS MV Vmax:       1.15 m/s    Systemic VTI:  0.13 m MV Vmean:      56.8 cm/s   Systemic Diam: 1.90 cm MV Decel Time: 180 msec MV E velocity: 94.90 cm/s Lorine Bears MD Electronically signed by Lorine Bears MD Signature Date/Time: 01/27/2023/2:39:55 PM    Final    CT ANGIO HEAD NECK W WO CM  Result Date: 01/27/2023 CLINICAL DATA:  Stroke follow-up EXAM: CT ANGIOGRAPHY HEAD AND NECK WITH AND WITHOUT CONTRAST TECHNIQUE:  Multidetector CT imaging of the head and neck was performed using the standard protocol during bolus administration of intravenous contrast. Multiplanar CT image reconstructions and MIPs were obtained to evaluate the vascular anatomy. Carotid stenosis measurements (when applicable) are obtained utilizing NASCET criteria, using the distal internal carotid diameter as the denominator. RADIATION DOSE REDUCTION: This exam was performed according to the departmental dose-optimization program which includes automated exposure control, adjustment of the mA and/or kV according to patient size and/or use of iterative reconstruction technique. CONTRAST:  75mL OMNIPAQUE IOHEXOL 350 MG/ML SOLN COMPARISON:  Brain MRI from yesterday FINDINGS: CT HEAD FINDINGS Brain: Small acute cortical infarcts are occult when compared to MRI. Extensive chronic small vessel ischemia in the cerebral white matter, deep gray nuclei, and pons. No acute hemorrhage or hydrocephalus Vascular: See below Skull: Negative Sinuses/Orbits: Unremarkable Review of the MIP images confirms the above findings CTA NECK FINDINGS Aortic arch:  Atheromatous plaque. 3 vessel branching with left vertebral artery arising from the arch in conjoined carotid origins. Right carotid system: Atheromatous plaque is mild for age at the bifurcation. No stenosis or ulceration. Left carotid system: Mild for age in mainly calcified plaque greatest at the bifurcation without ulceration or flow reducing stenosis. Vertebral arteries: The right vertebral artery is dominant. The left vertebral artery arises from the arch. No proximal subclavian stenosis despite atherosclerosis. The vertebral arteries are smoothly contoured and widely patent. Skeleton: Unremarkable Other neck: No acute finding Upper chest: No acute finding Review of the MIP images confirms the above findings CTA HEAD FINDINGS Anterior circulation: Atheromatous calcification of the carotid siphons. Diffuse atheromatous  irregularity of intracranial branches with moderate moderate left M1 stenosis. There is a supraclinoid right ICA aneurysm projecting superiorly and laterally measuring 3.5 mm on coronal reformats. Posterior circulation: Right dominant vertebral artery. Early branching left PICA. Atheromatous irregularity asymmetrically affects the left vertebral artery but no focal high-grade stenosis. Extensive atheromatous irregularity of the posterior cerebral arteries with high-grade left P2 stenosis. Fetal type origin of the right PCA. Venous sinuses: Unremarkable in the arterial phase Anatomic variants: As above Review of the MIP images confirms the above findings IMPRESSION: 1. No emergent vascular finding. 2. Atherosclerosis in the neck without ulceration or flow reducing stenosis. 3. Even greater intracranial atherosclerosis with moderate left MCA and and advanced left PCA stenoses. 4. 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 Electronically Signed   By: Tiburcio Pea M.D.   On: 01/27/2023 10:23   MR Brain Wo Contrast (neuro protocol)  Result Date: 01/26/2023 CLINICAL DATA:  Initial evaluation for mental status change, unknown cause. EXAM: MRI HEAD WITHOUT CONTRAST TECHNIQUE: Multiplanar, multiecho pulse sequences of the brain and surrounding structures were obtained without intravenous contrast. COMPARISON:  Prior CT from earlier the same day. FINDINGS: Brain: Examination mildly degraded by motion artifact. Diffuse prominence of the CSF containing spaces compatible generalized age-related cerebral atrophy. Patchy and confluent T2/FLAIR hyperintensity involving the periventricular and deep white matter both cerebral hemispheres as well as the pons, consistent with chronic small vessel ischemic disease, moderate to advanced in nature. Few small remote infarcts noted about the right greater than left cerebellum. Small remote lacunar infarct noted within the left pons. Patchy small volume diffusion signal abnormality is  seen involving the cortical gray matter of the left occipital lobe (series 13, image 25). Additional patchy small volume diffusion signal abnormality noted at the contralateral right occipital lobe (series 13, image 30). Mild patchy involvement of the right posterior splenium (series 13, image 26). Findings consistent with small acute to subacute ischemic infarcts. Associated mild petechial blood products noted on SWI sequence (series 17, images 33, 38). No significant mass effect. No other evidence for acute or subacute ischemia. No other acute intracranial hemorrhage. Few additional punctate chronic micro hemorrhages noted elsewhere, likely small vessel related. No mass lesion, midline shift or mass effect. Mild ventricular prominence related to global parenchymal volume loss of hydrocephalus. No extra-axial fluid collection. Pituitary gland and suprasellar region within normal limits. Vascular: Major intracranial vascular flow voids are maintained. Skull and upper cervical spine: Craniocervical junction with normal limits. Bone marrow signal intensity normal. No scalp soft tissue abnormality. Sinuses/Orbits: Prior bilateral ocular lens replacement. Paranasal sinuses are largely clear. No significant mastoid effusion. Other: None. IMPRESSION: 1. Patchy small volume acute to early subacute ischemic infarcts involving the left greater than right occipital lobes and right posterior splenium. Associated mild petechial blood products without frank hemorrhagic  transformation or significant mass effect. 2. Underlying age-related cerebral atrophy with moderate to advanced chronic microvascular ischemic disease, with a few small remote infarcts involving the right greater than left cerebellum and left pons. Electronically Signed   By: Rise Mu M.D.   On: 01/26/2023 21:44   DG Chest Portable 1 View  Result Date: 01/26/2023 CLINICAL DATA:  Altered mental status. EXAM: PORTABLE CHEST 1 VIEW COMPARISON:  None  Available. FINDINGS: 1644 hours. The heart size is at the upper limits of normal for AP technique. There is aortic atherosclerosis and mitral annular calcification. Mild interstitial prominence throughout the lungs without definite edema. No focal airspace disease, pleural effusion or pneumothorax. No acute osseous findings are evident. IMPRESSION: Borderline heart size with mild interstitial prominence. No definite edema or focal airspace disease. Electronically Signed   By: Carey Bullocks M.D.   On: 01/26/2023 17:30   CT Head Wo Contrast  Result Date: 01/26/2023 CLINICAL DATA:  Delirium EXAM: CT HEAD WITHOUT CONTRAST TECHNIQUE: Contiguous axial images were obtained from the base of the skull through the vertex without intravenous contrast. RADIATION DOSE REDUCTION: This exam was performed according to the departmental dose-optimization program which includes automated exposure control, adjustment of the mA and/or kV according to patient size and/or use of iterative reconstruction technique. COMPARISON:  None Available. FINDINGS: Brain: No evidence of acute infarction, hemorrhage, hydrocephalus, extra-axial collection or mass lesion/mass effect. There is moderate diffuse atrophy. There is moderate periventricular and deep white matter hypodensity, likely chronic small vessel ischemic change. Vascular: Atherosclerotic calcifications are present within the cavernous internal carotid arteries. Skull: Normal. Negative for fracture or focal lesion. Sinuses/Orbits: No acute finding. Other: None. IMPRESSION: 1. No acute intracranial process. 2. Moderate atrophy and chronic small vessel ischemic changes. Electronically Signed   By: Darliss Cheney M.D.   On: 01/26/2023 17:23    Microbiology: No results found for this or any previous visit (from the past 240 hour(s)).   Labs: Basic Metabolic Panel: Recent Labs  Lab 02/06/23 1235 02/07/23 0407 02/08/23 0343 02/09/23 0503 02/10/23 0254 02/10/23 2342  NA  --   133* 132* 133* 132* 132*  K  --  4.1 4.5 4.3 4.3 4.3  CL  --  104 105 103 102 102  CO2  --  21* 23 22 22  21*  GLUCOSE  --  195* 205* 179* 185* 86  BUN  --  35* 36* 23 25* 21  CREATININE  --  1.03* 1.00 0.83 0.87 0.75  CALCIUM  --  8.0* 8.2* 8.3* 8.2* 8.0*  MG 1.9  --   --  1.7 2.1  --    Liver Function Tests: Recent Labs  Lab 02/05/23 0532  AST 36  ALT 31  ALKPHOS 43  BILITOT 0.8  PROT 5.4*  ALBUMIN 3.1*   No results for input(s): "LIPASE", "AMYLASE" in the last 168 hours. No results for input(s): "AMMONIA" in the last 168 hours. CBC: Recent Labs  Lab 02/05/23 0532 02/05/23 0543 02/06/23 1235 02/07/23 0407 02/08/23 0343 02/09/23 0503 02/10/23 0254 02/10/23 2342  WBC 11.3*   < > 14.3* 10.3 9.0 8.1 9.3 9.1  NEUTROABS 10.0*  --  12.5*  --   --   --   --   --   HGB 8.8*   < > 11.5* 8.8* 8.8* 9.0* 9.6* 9.4*  HCT 27.5*   < > 33.1* 25.5* 26.6* 27.7* 28.6* 27.5*  MCV 88.1   < > 86.2 84.7 88.7 87.4 88.3 87.6  PLT 215   < >  133* 121* 122* 156 181 142*   < > = values in this interval not displayed.   Cardiac Enzymes: No results for input(s): "CKTOTAL", "CKMB", "CKMBINDEX", "TROPONINI" in the last 168 hours. BNP: BNP (last 3 results) No results for input(s): "BNP" in the last 8760 hours.  ProBNP (last 3 results) No results for input(s): "PROBNP" in the last 8760 hours.  CBG: Recent Labs  Lab 02/10/23 1123 02/10/23 1653 02/10/23 2022 02/11/23 0816 02/11/23 1239  GLUCAP 239* 251* 105* 170* 209*       Signed:  Ramiro Harvest MD.  [facility] 02/11/2023, 12:57 PM

## 2023-02-11 NOTE — TOC Progression Note (Signed)
Transition of Care [facility]) - Progression Note    Patient Details  Name: Barbara Butler MRN: [id number] Date of Birth: 1929-12-28  Transition of Care [facility]) CM/SW Contact   A [place], Theresia Majors Phone Number: 02/11/2023, 8:54 AM  Clinical Narrative:     Pt's insurance Berkley Harvey was approved.  Berkley Harvey ID [id number] Reference ID [id number]  Approval Dates:  02/10/2023-02/12/2023  Provider, facility and pt notified. Discharging pending today.  Expected Discharge Plan: Skilled Nursing Facility Barriers to Discharge: Continued Medical Work up  Expected Discharge Plan and Services   Discharge Planning Services: CM Consult Post Acute Care Choice: Skilled Nursing Facility Living arrangements for the past 2 months: Single Family Home                                       Social Determinants of Health (SDOH) Interventions SDOH Screenings   Food Insecurity: No Food Insecurity (02/05/2023)  Housing: Low Risk  (02/05/2023)  Transportation Needs: No Transportation Needs (02/05/2023)  Utilities: Not At Risk (02/05/2023)  Tobacco Use: Medium Risk (02/05/2023)    Readmission Risk Interventions    02/07/2023    3:03 PM  Readmission Risk Prevention Plan  Transportation Screening Complete  PCP or Specialist Appt within 5-7 Days Complete  Home Care Screening Complete  Medication Review (RN CM) Complete

## 2023-02-11 NOTE — Progress Notes (Signed)
PT Cancellation Note  Patient Details Name: Barbara Butler MRN: [id number] DOB: 1929-09-15   Cancelled Treatment:    Reason Eval/Treat Not Completed: (P) Patient declined, no reason specified;Other (comment), pt eating lunch on arrival and stating she is d/cing to SNF at 4 and requesting to eat and rest until then. Will continue to follow acutely to continue with PT POC.  Lenora Boys. PTA Acute Rehabilitation Services Office: [phone]    Catalina Antigua 02/11/2023, 3:25 PM

## 2023-02-11 NOTE — Progress Notes (Signed)
Report called to [facility] at this time

## 2023-02-11 NOTE — Care Management Important Message (Signed)
Important Message  Patient Details  Name: Barbara Butler MRN: [id number] Date of Birth: Sep 20, 1929   Medicare Important Message Given:  Yes       02/11/2023, 1:02 PM

## 2023-02-11 NOTE — TOC Transition Note (Signed)
Transition of Care [facility]) - CM/SW Discharge Note   Patient Details  Name: Barbara Butler MRN: [id number] Date of Birth: [birth year]  Transition of Care [facility]) CM/SW Contact:   A [place], LCSWA Phone Number: 02/11/2023, 2:54 PM   Clinical Narrative:     Patient will DC to: [facility]  Anticipated DC date: 02/11/23  Family notified: Maurilio Lovely  Transport by: Sharin Mons      Per MD patient ready for DC to [facility] . RN, patient, patient's family, and facility notified of DC. Discharge Summary and FL2 sent to facility. RN to call report prior to discharge ( [phone] ). DC packet on chart. Ambulance transport requested for patient.     CSW will sign off for now as social work intervention is no longer needed. Please consult [place] again if new needs arise.   Final next level of care: Skilled Nursing Facility Barriers to Discharge: Barriers Resolved   Patient Goals and CMS Choice CMS Medicare.gov Compare Post Acute Care list provided to:: Patient Choice offered to / list presented to : Patient  Discharge Placement                Patient chooses bed at: [facility] Patient to be transferred to facility by: [facility] Name of family member notified: Maurilio Lovely Patient and family notified of of transfer: 02/11/23  Discharge Plan and Services Additional resources added to the After Visit Summary for     Discharge Planning Services: CM Consult Post Acute Care Choice: Skilled Nursing Facility                               Social Determinants of Health (SDOH) Interventions SDOH Screenings   Food Insecurity: No Food Insecurity (02/05/2023)  Housing: Low Risk  (02/05/2023)  Transportation Needs: No Transportation Needs (02/05/2023)  Utilities: Not At Risk (02/05/2023)  Tobacco Use: Medium Risk (02/05/2023)     Readmission Risk Interventions    02/07/2023    3:03 PM  Readmission Risk Prevention Plan  Transportation Screening  Complete  PCP or Specialist Appt within 5-7 Days Complete  Home Care Screening Complete  Medication Review (RN CM) Complete

## 2023-03-03 ENCOUNTER — Encounter: Payer: Self-pay | Admitting: Medical

## 2023-03-03 ENCOUNTER — Ambulatory Visit: Payer: Medicare PPO | Attending: Medical | Admitting: Medical

## 2023-03-03 VITALS — BP 146/92 | HR 91 | Ht 62.0 in | Wt 139.9 lb

## 2023-03-03 DIAGNOSIS — R600 Localized edema: Secondary | ICD-10-CM | POA: Diagnosis not present

## 2023-03-03 DIAGNOSIS — D649 Anemia, unspecified: Secondary | ICD-10-CM

## 2023-03-03 DIAGNOSIS — Z8673 Personal history of transient ischemic attack (TIA), and cerebral infarction without residual deficits: Secondary | ICD-10-CM

## 2023-03-03 DIAGNOSIS — I4892 Unspecified atrial flutter: Secondary | ICD-10-CM | POA: Diagnosis not present

## 2023-03-03 DIAGNOSIS — I4891 Unspecified atrial fibrillation: Secondary | ICD-10-CM

## 2023-03-03 DIAGNOSIS — Z79899 Other long term (current) drug therapy: Secondary | ICD-10-CM

## 2023-03-03 DIAGNOSIS — I10 Essential (primary) hypertension: Secondary | ICD-10-CM

## 2023-03-03 MED ORDER — METOPROLOL SUCCINATE ER 50 MG PO TB24: 50.0000 mg | ORAL_TABLET | Freq: Every day | ORAL | 3 refills | Status: DC

## 2023-03-03 NOTE — Progress Notes (Signed)
Cardiology Office Note:    Date:  03/03/2023   ID:  Barbara Butler, DOB 1929/10/07, MRN [id number]  PCP:  [name], MD  [facility] Cardiologist:  None  [facility] Electrophysiologist:  None   Referring MD: [name], MD   Chief Complaint: Hospital follow-up  History of Present Illness:    Barbara Butler is a 89 y.o. female with a hx of hypertension, hyperlipidemia, diabetes type 2, hypothyroidism, anemia, Afib on Eliquis,  and recent stroke who is being seen for hospital follow-up.  Patient presented to [facility] ED 01/26/2023 for altered mental status.  In the ER she was noted to be in A-fib/flutter with heart rate of 97 bpm.  Imaging of the head showed acute to subacute ischemic infarcts in the occipital lobes and posterior splenium.  CHA2DS2-VASc of 8.  She was started on Eliquis 5 mg twice daily.  Echo showed LVEF 70 to 75%, moderate LVH, severely dilated left atrium.  She was started on Toprol 25 mg daily.  Plan was to continue rate control given severely dilated left atrium, advanced age and asymptomatic. She was discharged to rehab. At rehab she sustained a fall and was went to the ER.   The patient was subsequently admitted 02/05/23-02/11/23 for a fall found to have a closed right hip fracture and right humerus fracture. She underwent right hip repair and was eventually discharged back to the rehab facility.   Today, the patient is at a rehab center and is slowly making progress. Right arm is still is still in a sling. She denies chest pain, shortness of breath. She has lower leg edema, it's new since the surgery. She denies orthopnea, pnd, lightheadedness, dizziness. No palpitations.   Past Medical History:  Diagnosis Date   Anemia    Diabetes mellitus without complication (HCC)    diet controlled   Diabetic neuropathy (HCC)    Dysrhythmia    Hypertension    Hypothyroidism     Past Surgical History:  Procedure Laterality Date   CATARACT EXTRACTION  W/PHACO Right 01/28/2019   Procedure: CATARACT EXTRACTION PHACO AND INTRAOCULAR LENS PLACEMENT (IOC) RIGHT DIABETES;  Surgeon: Elliot Cousin, MD;  Location: [facility] ORS;  Service: Ophthalmology;  Laterality: Right;  [place] 00:49.3 CDE 8.12 Fluid Pack Lot # [id number]   CATARACT EXTRACTION W/PHACO Left 02/25/2019   Procedure: CATARACT EXTRACTION PHACO AND INTRAOCULAR LENS PLACEMENT (IOC), LEFT, DIABETIC, VISION BLUE;  Surgeon: Elliot Cousin, MD;  Location: [facility] ORS;  Service: Ophthalmology;  Laterality: Left;  [place]  01:10 CDE 11.43 Fluid pack lot # [id number]   FRACTURE SURGERY     INTRAMEDULLARY (IM) NAIL INTERTROCHANTERIC Right 02/06/2023   Procedure: INTRAMEDULLARY NAILING OF RIGHT FEMUR;  Surgeon: Roby Lofts, MD;  Location: [facility] OR;  Service: Orthopedics;  Laterality: Right;   ORIF WRIST FRACTURE Right 06/28/2016   Procedure: OPEN REDUCTION INTERNAL FIXATION (ORIF) WRIST FRACTURE;  Surgeon: Kennedy Bucker, MD;  Location: [facility] ORS;  Service: Orthopedics;  Laterality: Right;   SALPINGECTOMY      Current Medications: Current Meds  Medication Sig   acetaminophen (TYLENOL) 325 MG tablet Take 1-2 tablets (325-650 mg total) by mouth every 6 (six) hours as needed for mild pain (pain score 1-3 or temp > 100.5).   amLODipine (NORVASC) 5 MG tablet Take 5 mg by mouth daily.   apixaban (ELIQUIS) 2.5 MG TABS tablet Take 1 tablet (2.5 mg total) by mouth 2 (two) times daily.   Ascorbic Acid (VITAMIN C) 1000 MG tablet Take 1,000 mg  by mouth daily at 12 noon.   cholecalciferol (VITAMIN D3) 25 MCG (1000 UT) tablet Take 1,000 Units by mouth daily at 12 noon.   furosemide (LASIX) 20 MG tablet Take 20 mg by mouth daily.   hydrALAZINE (APRESOLINE) 50 MG tablet Take 50 mg by mouth 2 (two) times a day.   levothyroxine (SYNTHROID, LEVOTHROID) 88 MCG tablet Take 88 mcg by mouth daily.    metFORMIN (GLUCOPHAGE) 500 MG tablet Take 500 mg by mouth 2 (two) times daily.   methocarbamol (ROBAXIN) 500 MG tablet Take 1 tablet (500 mg  total) by mouth every 6 (six) hours as needed for muscle spasms.   metoprolol succinate (TOPROL-XL) 50 MG 24 hr tablet Take 1 tablet (50 mg total) by mouth daily. Take with or immediately following a meal.   oxyCODONE (OXY IR/ROXICODONE) 5 MG immediate release tablet Take 0.5 tablets (2.5 mg total) by mouth every 4 (four) hours as needed for breakthrough pain ((for MODERATE breakthrough pain)).   polyethylene glycol (MIRALAX / GLYCOLAX) 17 g packet Take 17 g by mouth daily as needed for mild constipation.   [DISCONTINUED] metoprolol succinate (TOPROL-XL) 25 MG 24 hr tablet Take 1 tablet by mouth daily.     Allergies:   Patient has no known allergies.   Social History   Socioeconomic History   Marital status: Widowed    Spouse name: Not on file   Number of children: Not on file   Years of education: Not on file   Highest education level: Not on file  Occupational History   Not on file  Tobacco Use   Smoking status: Former    Current packs/day: 0.00    Types: Cigarettes    Quit date: 08/07/1958    Years since quitting: 64.6   Smokeless tobacco: Never  Substance and Sexual Activity   Alcohol use: Not Currently   Drug use: No   Sexual activity: Never  Other Topics Concern   Not on file  Social History Narrative   Not on file   Social Determinants of Health   Financial Resource Strain: Not on file  Food Insecurity: No Food Insecurity (02/05/2023)   Hunger Vital Sign    Worried About Running Out of Food in the Last Year: Never true    Ran Out of Food in the Last Year: Never true  Transportation Needs: No Transportation Needs (02/05/2023)   PRAPARE - Administrator, Civil Service (Medical): No    Lack of Transportation (Non-Medical): No  Physical Activity: Not on file  Stress: Not on file  Social Connections: Not on file     Family History: The patient's family history is negative for Breast cancer.  ROS:   Please see the history of present illness.     All  other systems reviewed and are negative.  EKGs/Labs/Other Studies Reviewed:    The following studies were reviewed today:  Echo 01/2023 1. Left ventricular ejection fraction, by estimation, is 70 to 75%. The  left ventricle has hyperdynamic function. The left ventricle has no  regional wall motion abnormalities. There is moderate asymmetric left  ventricular hypertrophy of the basal-septal   segment. Left ventricular diastolic parameters are indeterminate.   2. Right ventricular systolic function is normal. The right ventricular  size is normal. There is normal pulmonary artery systolic pressure.   3. Left atrial size was severely dilated.   4. Right atrial size was moderately dilated.   5. The mitral valve is degenerative. Mild to moderate  mitral valve  regurgitation. No evidence of mitral stenosis. Moderate mitral annular  calcification.   6. Tricuspid valve regurgitation is moderate.   7. The aortic valve is calcified. Aortic valve regurgitation is mild.  Aortic valve sclerosis/calcification is present, without any evidence of  aortic stenosis.   8. The inferior vena cava is normal in size with greater than 50%  respiratory variability, suggesting right atrial pressure of 3 mmHg.   EKG:  EKG is  ordered today.  The ekg ordered today demonstrates Afib, 91bpm, RBBB, septal q waves, TWI inf leads  Recent Labs: 01/26/2023: TSH 3.372 02/05/2023: ALT 31 02/10/2023: BUN 21; Creatinine, Ser 0.75; Hemoglobin 9.4; Magnesium 2.1; Platelets 142; Potassium 4.3; Sodium 132  Recent Lipid Panel    Component Value Date/Time   CHOL 122 01/27/2023 0338   TRIG 71 01/27/2023 0338   HDL 26 (L) 01/27/2023 0338   CHOLHDL 4.7 01/27/2023 0338   VLDL 14 01/27/2023 0338   LDLCALC 82 01/27/2023 0338     Physical Exam:    VS:  BP (!) 146/92 (BP Location: Right Arm, Patient Position: Sitting, Cuff Size: Normal)   Pulse 91   Ht 5\' 2"  (1.575 m)   Wt 139 lb 14.4 oz (63.5 kg)   SpO2 99%   BMI 25.59  kg/m     Wt Readings from Last 3 Encounters:  03/03/23 139 lb 14.4 oz (63.5 kg)  02/11/23 122 lb 5.7 oz (55.5 kg)  01/28/23 121 lb 4.1 oz (55 kg)     GEN:  Well nourished, well developed in no acute distress HEENT: Normal NECK: No JVD; No carotid bruits LYMPHATICS: No lymphadenopathy CARDIAC: Irreg Irreg, no murmurs, rubs, gallops RESPIRATORY:  Clear to auscultation without rales, wheezing or rhonchi  ABDOMEN: Soft, non-tender, non-distended MUSCULOSKELETAL:  1=2+ pedal edema; No deformity  SKIN: Warm and dry NEUROLOGIC:  Alert and oriented x 3 PSYCHIATRIC:  Normal affect   ASSESSMENT:    1. Atrial fibrillation/flutter (HCC)   2. History of stroke   3. Lower leg edema   4. Anemia, unspecified type   5. Essential hypertension   6. Medication management    PLAN:    In order of problems listed above:  Recently diagnosed Afib Recent admission for stroke found to have rate controlled A-fib/flutter.  Patient reported long history of A-fib, although unclear chronicity. Patient was started on Eliquis 2.5 mg twice daily (age, weight) for CHA2DS2-VASc score of 8.  Echo showed LVEF 70 to 75%, moderate LVH, severely dilated left atrium.  She was started on Toprol 25 mg for rate control.  Plan was to continue with conservative management and not pursue cardioversion given advanced age, comorbidities, dilated left atrium and asymptomatic status.  Today, patient remains in rate controlled A-fib with a heart rate of 91 bpm.  Patient is overall asymptomatic, although she does have some lower leg edema on exam.  She has been started on Lasix at the rehab center.  Long discussion regarding treatment of A-fib.  We will continue medical management.  I will increase Toprol to 50 mg daily.  I will update CBC given Eliquis.  Stop aspirin due to Eliquis.  Recent CVA Patient is in a rehab facility and making good progression.  Lower leg edema Patient has lower leg edema on exam today.  She was started  on Lasix 20 mg daily in the rehab center.  I will check a BMET.  Echo during recent hospitalization showed LVEF of 70 to 75%, moderate LVH, mild to  moderate MR, moderate TR, mild AI. LLE could be from the recent hip surgery. Continue current Lasix dose for now.  I recommended low-salt diet, compression socks and leg elevation.  Anemia Hgb PTA 11-12. Most recent Hgb 9-10 . Update CBC given Eliquis.   HTN BP is mildly elevated today. Continue Amlodipine 5mg  daily, Hydralazine 50mg  BID. Increase Toprol to 50mg  daily as above.   Disposition: Follow up in 2 month(s) with MD/APP    Signed, Wrenly Lauritsen David Stall, PA-C  03/03/2023 9:14 AM    [facility]

## 2023-03-03 NOTE — Patient Instructions (Signed)
Medication Instructions:  Your physician recommends the following medication changes.  STOP TAKING: Asprin 81 mg daily  INCREASE: Metoprolol to 50 mg by mouth daily   *If you need a refill on your cardiac medications before your next appointment, please call your pharmacy*   Lab Work: Your provider would like for you to to have the following labs drawn: (CBC, BMP) in 1-2 weeks.    Testing/Procedures: No test ordered today    Follow-Up: At [facility], you and your health needs are our priority.  As part of our continuing mission to provide you with exceptional heart care, we have created designated Provider Care Teams.  These Care Teams include your primary Cardiologist (physician) and Advanced Practice Providers (APPs -  Physician Assistants and Nurse Practitioners) who all work together to provide you with the care you need, when you need it.  We recommend signing up for the patient portal called "MyChart".  Sign up information is provided on this After Visit Summary.  MyChart is used to connect with patients for Virtual Visits (Telemedicine).  Patients are able to view lab/test results, encounter notes, upcoming appointments, etc.  Non-urgent messages can be sent to your provider as well.   To learn more about what you can do with MyChart, go to [email or web address].    Your next appointment:   2 month(s)  Provider:   Terrilee Croak, PA-C

## 2023-04-05 ENCOUNTER — Emergency Department: Payer: Medicare PPO

## 2023-04-05 ENCOUNTER — Inpatient Hospital Stay
Admission: EM | Admit: 2023-04-05 | Discharge: 2023-04-08 | DRG: 872 | Disposition: A | Discharge status: Skilled Nursing Facility | Payer: Medicare PPO | Source: Skilled Nursing Facility | Attending: Internal Medicine | Admitting: Internal Medicine

## 2023-04-05 ENCOUNTER — Other Ambulatory Visit: Payer: Self-pay

## 2023-04-05 DIAGNOSIS — Z79890 Hormone replacement therapy (postmenopausal): Secondary | ICD-10-CM | POA: Diagnosis not present

## 2023-04-05 DIAGNOSIS — K649 Unspecified hemorrhoids: Secondary | ICD-10-CM | POA: Diagnosis present

## 2023-04-05 DIAGNOSIS — I4891 Unspecified atrial fibrillation: Secondary | ICD-10-CM | POA: Diagnosis present

## 2023-04-05 DIAGNOSIS — I10 Essential (primary) hypertension: Secondary | ICD-10-CM | POA: Diagnosis present

## 2023-04-05 DIAGNOSIS — Z7984 Long term (current) use of oral hypoglycemic drugs: Secondary | ICD-10-CM

## 2023-04-05 DIAGNOSIS — K5730 Diverticulosis of large intestine without perforation or abscess without bleeding: Secondary | ICD-10-CM | POA: Diagnosis present

## 2023-04-05 DIAGNOSIS — R6520 Severe sepsis without septic shock: Secondary | ICD-10-CM | POA: Diagnosis present

## 2023-04-05 DIAGNOSIS — K625 Hemorrhage of anus and rectum: Principal | ICD-10-CM

## 2023-04-05 DIAGNOSIS — Z7901 Long term (current) use of anticoagulants: Secondary | ICD-10-CM | POA: Diagnosis not present

## 2023-04-05 DIAGNOSIS — E1140 Type 2 diabetes mellitus with diabetic neuropathy, unspecified: Secondary | ICD-10-CM | POA: Diagnosis present

## 2023-04-05 DIAGNOSIS — E876 Hypokalemia: Secondary | ICD-10-CM | POA: Diagnosis present

## 2023-04-05 DIAGNOSIS — Z1629 Resistance to other single specified antibiotic: Secondary | ICD-10-CM | POA: Diagnosis present

## 2023-04-05 DIAGNOSIS — E1165 Type 2 diabetes mellitus with hyperglycemia: Secondary | ICD-10-CM | POA: Diagnosis present

## 2023-04-05 DIAGNOSIS — E785 Hyperlipidemia, unspecified: Secondary | ICD-10-CM | POA: Diagnosis present

## 2023-04-05 DIAGNOSIS — N390 Urinary tract infection, site not specified: Secondary | ICD-10-CM | POA: Diagnosis present

## 2023-04-05 DIAGNOSIS — Z8673 Personal history of transient ischemic attack (TIA), and cerebral infarction without residual deficits: Secondary | ICD-10-CM

## 2023-04-05 DIAGNOSIS — R54 Age-related physical debility: Secondary | ICD-10-CM | POA: Diagnosis present

## 2023-04-05 DIAGNOSIS — K8020 Calculus of gallbladder without cholecystitis without obstruction: Secondary | ICD-10-CM | POA: Diagnosis present

## 2023-04-05 DIAGNOSIS — A419 Sepsis, unspecified organism: Secondary | ICD-10-CM | POA: Diagnosis not present

## 2023-04-05 DIAGNOSIS — I4892 Unspecified atrial flutter: Secondary | ICD-10-CM | POA: Diagnosis present

## 2023-04-05 DIAGNOSIS — I639 Cerebral infarction, unspecified: Secondary | ICD-10-CM | POA: Diagnosis present

## 2023-04-05 DIAGNOSIS — K921 Melena: Secondary | ICD-10-CM | POA: Diagnosis present

## 2023-04-05 DIAGNOSIS — A4151 Sepsis due to Escherichia coli [E. coli]: Secondary | ICD-10-CM | POA: Diagnosis present

## 2023-04-05 DIAGNOSIS — Z79899 Other long term (current) drug therapy: Secondary | ICD-10-CM

## 2023-04-05 DIAGNOSIS — E039 Hypothyroidism, unspecified: Secondary | ICD-10-CM | POA: Diagnosis present

## 2023-04-05 DIAGNOSIS — Z6822 Body mass index (BMI) 22.0-22.9, adult: Secondary | ICD-10-CM

## 2023-04-05 DIAGNOSIS — Z87891 Personal history of nicotine dependence: Secondary | ICD-10-CM

## 2023-04-05 DIAGNOSIS — N3000 Acute cystitis without hematuria: Secondary | ICD-10-CM

## 2023-04-05 DIAGNOSIS — Z1611 Resistance to penicillins: Secondary | ICD-10-CM | POA: Diagnosis present

## 2023-04-05 DIAGNOSIS — K644 Residual hemorrhoidal skin tags: Secondary | ICD-10-CM | POA: Diagnosis present

## 2023-04-05 DIAGNOSIS — M7989 Other specified soft tissue disorders: Secondary | ICD-10-CM | POA: Insufficient documentation

## 2023-04-05 DIAGNOSIS — S42213A Unspecified displaced fracture of surgical neck of unspecified humerus, initial encounter for closed fracture: Secondary | ICD-10-CM | POA: Diagnosis present

## 2023-04-05 LAB — CBC WITH DIFFERENTIAL/PLATELET
Abs Immature Granulocytes: 0.02 10*3/uL (ref 0.00–0.07)
Basophils Absolute: 0.1 10*3/uL (ref 0.0–0.1)
Basophils Relative: 1 %
Eosinophils Absolute: 0.3 10*3/uL (ref 0.0–0.5)
Eosinophils Relative: 6 %
HCT: 45.2 % (ref 36.0–46.0)
Hemoglobin: 14.8 g/dL (ref 12.0–15.0)
Immature Granulocytes: 0 %
Lymphocytes Relative: 31 %
Lymphs Abs: 1.7 10*3/uL (ref 0.7–4.0)
MCH: 29.6 pg (ref 26.0–34.0)
MCHC: 32.7 g/dL (ref 30.0–36.0)
MCV: 90.4 fL (ref 80.0–100.0)
Monocytes Absolute: 0.3 10*3/uL (ref 0.1–1.0)
Monocytes Relative: 5 %
Neutro Abs: 3.1 10*3/uL (ref 1.7–7.7)
Neutrophils Relative %: 57 %
Platelets: 315 10*3/uL (ref 150–400)
RBC: 5 MIL/uL (ref 3.87–5.11)
RDW: 15.5 % (ref 11.5–15.5)
WBC: 5.4 10*3/uL (ref 4.0–10.5)
nRBC: 0 % (ref 0.0–0.2)

## 2023-04-05 LAB — URINALYSIS, W/ REFLEX TO CULTURE (INFECTION SUSPECTED)
Bilirubin Urine: NEGATIVE
Glucose, UA: NEGATIVE mg/dL
Ketones, ur: NEGATIVE mg/dL
Nitrite: NEGATIVE
Protein, ur: NEGATIVE mg/dL
Specific Gravity, Urine: 1.004 — ABNORMAL LOW (ref 1.005–1.030)
WBC, UA: >50 WBC/hpf (ref 0–5)
pH: 7 (ref 5.0–8.0)

## 2023-04-05 LAB — COMPREHENSIVE METABOLIC PANEL
ALT: 12 U/L (ref 0–44)
AST: 23 U/L (ref 15–41)
Albumin: 4 g/dL (ref 3.5–5.0)
Alkaline Phosphatase: 102 U/L (ref 38–126)
Anion gap: 12 (ref 5–15)
BUN: 20 mg/dL (ref 8–23)
CO2: 23 mmol/L (ref 22–32)
Calcium: 9.3 mg/dL (ref 8.9–10.3)
Chloride: 103 mmol/L (ref 98–111)
Creatinine, Ser: 0.71 mg/dL (ref 0.44–1.00)
GFR, Estimated: >60 mL/min (ref >60)
Glucose, Bld: 172 mg/dL — ABNORMAL HIGH (ref 70–99)
Potassium: 4 mmol/L (ref 3.5–5.1)
Sodium: 138 mmol/L (ref 135–145)
Total Bilirubin: 0.8 mg/dL (ref 0.3–1.2)
Total Protein: 7.9 g/dL (ref 6.5–8.1)

## 2023-04-05 LAB — PROTIME-INR
INR: 1.4 — ABNORMAL HIGH (ref 0.8–1.2)
Prothrombin Time: 17.6 s — ABNORMAL HIGH (ref 11.4–15.2)

## 2023-04-05 LAB — GLUCOSE, CAPILLARY
Glucose-Capillary: 146 mg/dL — ABNORMAL HIGH (ref 70–99)
Glucose-Capillary: 158 mg/dL — ABNORMAL HIGH (ref 70–99)

## 2023-04-05 LAB — TYPE AND SCREEN
ABO/RH(D): O POS
Antibody Screen: NEGATIVE

## 2023-04-05 LAB — LIPASE, BLOOD: Lipase: 28 U/L (ref 11–51)

## 2023-04-05 LAB — LACTIC ACID, PLASMA
Lactic Acid, Venous: 2.7 mmol/L (ref 0.5–1.9)
Lactic Acid, Venous: 3.9 mmol/L (ref 0.5–1.9)
Lactic Acid, Venous: 2.8 mmol/L (ref 0.5–1.9)

## 2023-04-05 MED ORDER — INFLUENZA VAC A&B SURF ANT ADJ 0.5 ML IM SUSY
0.5000 mL | PREFILLED_SYRINGE | INTRAMUSCULAR | Status: DC
  Filled 2023-04-05: qty 0.5

## 2023-04-05 MED ORDER — LEVOTHYROXINE SODIUM 88 MCG PO TABS
88.0000 ug | ORAL_TABLET | Freq: Every day | ORAL | Status: DC
  Administered 2023-04-06 – 2023-04-08 (×3): 88 ug via ORAL
  Filled 2023-04-05 (×3): qty 1

## 2023-04-05 MED ORDER — INSULIN ASPART 100 UNIT/ML IJ SOLN: 0.0000 [IU] | Freq: Every day | INTRAMUSCULAR | Status: DC

## 2023-04-05 MED ORDER — ONDANSETRON HCL 4 MG/2ML IJ SOLN: 4.0000 mg | Freq: Four times a day (QID) | INTRAMUSCULAR | Status: DC | PRN

## 2023-04-05 MED ORDER — INSULIN ASPART 100 UNIT/ML IJ SOLN
0.0000 [IU] | Freq: Three times a day (TID) | INTRAMUSCULAR | Status: DC
  Administered 2023-04-06: 1 [IU] via SUBCUTANEOUS
  Administered 2023-04-06 – 2023-04-07 (×4): 2 [IU] via SUBCUTANEOUS
  Administered 2023-04-07: 3 [IU] via SUBCUTANEOUS
  Administered 2023-04-08: 2 [IU] via SUBCUTANEOUS
  Filled 2023-04-05 (×7): qty 1

## 2023-04-05 MED ORDER — ENSURE ENLIVE PO LIQD
237.0000 mL | ORAL | Status: DC
  Administered 2023-04-06 – 2023-04-07 (×2): 237 mL via ORAL

## 2023-04-05 MED ORDER — IOHEXOL 350 MG/ML SOLN
75.0000 mL | Freq: Once | INTRAVENOUS | Status: AC | PRN
  Administered 2023-04-05: 75 mL via INTRAVENOUS

## 2023-04-05 MED ORDER — PNEUMOCOCCAL 20-VAL CONJ VACC 0.5 ML IM SUSY: 0.5000 mL | PREFILLED_SYRINGE | INTRAMUSCULAR | Status: DC

## 2023-04-05 MED ORDER — POLYETHYLENE GLYCOL 3350 17 G PO PACK: 17.0000 g | PACK | Freq: Every day | ORAL | Status: DC | PRN

## 2023-04-05 MED ORDER — OXYCODONE HCL 5 MG PO TABS: 2.5000 mg | ORAL_TABLET | ORAL | Status: DC | PRN

## 2023-04-05 MED ORDER — ONDANSETRON HCL 4 MG PO TABS: 4.0000 mg | ORAL_TABLET | Freq: Four times a day (QID) | ORAL | Status: DC | PRN

## 2023-04-05 MED ORDER — ACETAMINOPHEN 650 MG RE SUPP: 650.0000 mg | Freq: Four times a day (QID) | RECTAL | Status: DC | PRN

## 2023-04-05 MED ORDER — VITAMIN D 25 MCG (1000 UNIT) PO TABS
1000.0000 [IU] | ORAL_TABLET | Freq: Every day | ORAL | Status: DC
  Administered 2023-04-05 – 2023-04-07 (×3): 1000 [IU] via ORAL
  Filled 2023-04-05 (×4): qty 1

## 2023-04-05 MED ORDER — SENNOSIDES-DOCUSATE SODIUM 8.6-50 MG PO TABS: 1.0000 | ORAL_TABLET | Freq: Every evening | ORAL | Status: DC | PRN

## 2023-04-05 MED ORDER — AMLODIPINE BESYLATE 5 MG PO TABS
5.0000 mg | ORAL_TABLET | Freq: Every day | ORAL | Status: DC
  Administered 2023-04-06 – 2023-04-08 (×3): 5 mg via ORAL
  Filled 2023-04-05 (×3): qty 1

## 2023-04-05 MED ORDER — METOPROLOL SUCCINATE ER 50 MG PO TB24
50.0000 mg | ORAL_TABLET | Freq: Every day | ORAL | Status: DC
  Administered 2023-04-06 – 2023-04-08 (×3): 50 mg via ORAL
  Filled 2023-04-05 (×3): qty 1

## 2023-04-05 MED ORDER — HYDRALAZINE HCL 20 MG/ML IJ SOLN
5.0000 mg | Freq: Four times a day (QID) | INTRAMUSCULAR | Status: DC | PRN
  Filled 2023-04-05: qty 1

## 2023-04-05 MED ORDER — ACETAMINOPHEN 325 MG PO TABS: 650.0000 mg | ORAL_TABLET | Freq: Four times a day (QID) | ORAL | Status: DC | PRN

## 2023-04-05 MED ORDER — METHOCARBAMOL 500 MG PO TABS: 500.0000 mg | ORAL_TABLET | Freq: Four times a day (QID) | ORAL | Status: DC | PRN

## 2023-04-05 MED ORDER — VITAMIN C 500 MG PO TABS
1000.0000 mg | ORAL_TABLET | Freq: Every day | ORAL | Status: DC
  Administered 2023-04-05 – 2023-04-07 (×3): 1000 mg via ORAL
  Filled 2023-04-05 (×4): qty 2

## 2023-04-05 MED ORDER — SODIUM CHLORIDE 0.9 % IV BOLUS
500.0000 mL | Freq: Once | INTRAVENOUS | Status: AC
  Administered 2023-04-05: 500 mL via INTRAVENOUS

## 2023-04-05 MED ORDER — HYDRALAZINE HCL 50 MG PO TABS
50.0000 mg | ORAL_TABLET | Freq: Two times a day (BID) | ORAL | Status: DC
  Administered 2023-04-05 – 2023-04-08 (×6): 50 mg via ORAL
  Filled 2023-04-05 (×6): qty 1

## 2023-04-05 MED ORDER — LACTATED RINGERS IV SOLN: INTRAVENOUS | Status: AC

## 2023-04-05 MED ORDER — LACTATED RINGERS IV BOLUS
1000.0000 mL | Freq: Once | INTRAVENOUS | Status: AC
  Administered 2023-04-05: 1000 mL via INTRAVENOUS

## 2023-04-05 MED ORDER — HYDRALAZINE HCL 20 MG/ML IJ SOLN
5.0000 mg | Freq: Four times a day (QID) | INTRAMUSCULAR | Status: DC | PRN
Start: 2023-04-05 — End: 2023-04-05

## 2023-04-05 MED ORDER — SODIUM CHLORIDE 0.9 % IV SOLN
2.0000 g | INTRAVENOUS | Status: DC
  Administered 2023-04-05 – 2023-04-07 (×3): 2 g via INTRAVENOUS
  Filled 2023-04-05 (×4): qty 20

## 2023-04-05 MED ORDER — APIXABAN 2.5 MG PO TABS
2.5000 mg | ORAL_TABLET | Freq: Two times a day (BID) | ORAL | Status: DC
  Administered 2023-04-05 – 2023-04-06 (×2): 2.5 mg via ORAL
  Filled 2023-04-05 (×2): qty 1

## 2023-04-05 NOTE — Hospital Course (Addendum)
Ms. Barbara Butler is a 89 year old female with history of close right hip proximal femoral fracture, history of proximal right humeral neck, history of CVA and atrial fibrillation on Eliquis, hyperlipidemia, non-insulin-dependent diabetes mellitus, hypothyroid, hypertension, who presents emergency department from [facility] for blood noticed in her stool.  Vitals in the ED showed temperature 98.3, respiration rate of 13, heart rate of 104, blood pressure 154/95, SpO2 100% on room air.  Serum sodium is 138, potassium 4.0, chloride 103, bicarb 23, BUN of 20, serum creatinine of 0.71, EGFR greater than 60, nonfasting blood glucose 172, WBC 5.4, hemoglobin 14.8, platelets of 315.  Lactic acid is 2.7. UA was positive for large leukocytes.  Per EDP, a rectal exam was done at bedside and there was small amount of blood mixed with stool.  Per EDP, negative for melena and frank bleeding from the rectum.  ED treatment: Ceftriaxone 2 g IV 7-day course ordered, LR 1 L bolus, sodium chloride 500 mL bolus, LR infusion at 150 mL/h.

## 2023-04-05 NOTE — Assessment & Plan Note (Addendum)
Urinary tract infection-present on admission Lactic acid 2.7 and on repeat was 3.9 I have scheduled a third lactic acid for recheck on admission, this is pending collection Blood cultures x 2 have been ordered and are in process for 1 set the second set is pending collection Urine culture is in process Continue ceftriaxone 2 g IV daily to complete 7-day course

## 2023-04-05 NOTE — H&P (Addendum)
History and Physical   [name] BMW:[id number] DOB: Feb 14, 1930 DOA: 04/05/2023  PCP: [name], MD  Patient coming from: [name] via [facility]  I have personally briefly reviewed patient's old medical records in [facility] EMR.  Chief Concern: Rectal bleed  HPI: Barbara Butler is a 89 year old female with history of close right hip proximal femoral fracture, history of proximal right humeral neck, history of CVA and atrial fibrillation on Eliquis, hyperlipidemia, non-insulin-dependent diabetes mellitus, hypothyroid, hypertension, who presents emergency department from [facility] for blood noticed in her stool.  Vitals in the ED showed temperature 98.3, respiration rate of 13, heart rate of 104, blood pressure 154/95, SpO2 100% on room air.  Serum sodium is 138, potassium 4.0, chloride 103, bicarb 23, BUN of 20, serum creatinine of 0.71, EGFR greater than 60, nonfasting blood glucose 172, WBC 5.4, hemoglobin 14.8, platelets of 315.  Lactic acid is 2.7. UA was positive for large leukocytes.  Per EDP, a rectal exam was done at bedside and there was small amount of blood mixed with stool.  Per EDP, negative for melena and frank bleeding from the rectum.  ED treatment: Ceftriaxone 2 g IV 7-day course ordered, LR 1 L bolus, sodium chloride 500 mL bolus, LR infusion at 150 mL/h. -------------------------- At bedside, patient able to tell me her name, age, location, current calendar year.  She does not appear to be in acute distress.  She states that she woke up this morning feeling wet, as she has not urinated on herself.  She reports that over the last day, she has had increased urination and this is unusual for her.  She denies dysuria, chest pain, shortness of breath, abdominal pain, fever, chills, cough.  Social history: Patient is currently residing at [facility] short-term rehab.  She denies tobacco, EtOH, recreational drug use.  Patient is  retired.  ROS: Constitutional: no weight change, no fever ENT/Mouth: no sore throat, no rhinorrhea Eyes: no eye pain, no vision changes Cardiovascular: no chest pain, no dyspnea,  no edema, no palpitations Respiratory: no cough, no sputum, no wheezing Gastrointestinal: no nausea, no vomiting, no diarrhea, no constipation Genitourinary: no urinary incontinence, no dysuria, no hematuria, + increased urination Musculoskeletal: no arthralgias, no myalgias Skin: no skin lesions, no pruritus, Neuro: + weakness, no loss of consciousness, no syncope Psych: no anxiety, no depression, + decrease appetite Heme/Lymph: no bruising, no bleeding  ED Course: Discussed with emergency medicine provider, patient requiring hospitalization for chief concerns of meeting sepsis criteria with UTI.  Assessment/Plan  Principal Problem:   Sepsis secondary to UTI [facility]) Active Problems:   Type 2 diabetes mellitus with hyperglycemia, without long-term current use of insulin (HCC)   Hypertension   Hypothyroidism   Cerebrovascular accident (CVA) (HCC)   Atrial fibrillation/flutter (HCC)   Humeral surgical neck fracture   Diverticulosis of colon   Cholelithiasis   Leg swelling   Assessment and Plan:  * Sepsis secondary to UTI (HCC) Urinary tract infection-present on admission Lactic acid 2.7 and on repeat was 3.9 I have scheduled a third lactic acid for recheck on admission, this is pending collection Blood cultures x 2 have been ordered and are in process for 1 set the second set is pending collection Urine culture is in process Continue ceftriaxone 2 g IV daily to complete 7-day course  Leg swelling Patient reports that she has gained a little bit of weight over the last 2 weeks and she denies shortness of breath or chest pain  Of note, patient was discontinued on her home scheduled Lasix about 2 weeks ago Strict I's and O's, patient may benefit from Lasix 20 mg as needed for weight gain and swelling  of her legs  Cholelithiasis Without evidence of cholecystitis  Diverticulosis of colon Pan colonic diverticulosis  Atrial fibrillation/flutter (HCC) Patient is at baseline or improved hemoglobin without signs of volume contraction/dehydration Home Eliquis 2.5 mg p.o. twice daily, metoprolol succinate 50 mg daily were resumed  Cerebrovascular accident (CVA) (HCC) History of small volume ischemic infarct in the occipital lobes (L > R) and right posterior splenium in 01/26/2023  Hypothyroidism Patient takes levothyroxine 88 mcg daily daily before breakfast resumed  Hypertension Hydralazine 5 mg IV every 6 hours as needed for SBP greater than 165, 10 days ordered  Type 2 diabetes mellitus with hyperglycemia, without long-term current use of insulin (HCC) Patient is on metformin 500 mg p.o. twice daily; this is not resumed on admission Last A1c was 01/27/2023 and it was 7.9 Insulin SSI with at bedtime coverage ordered Goal inpatient blood glucose levels 140-180  Chart reviewed.   DVT prophylaxis: Apixaban 2.5 mg p.o. twice daily Code Status: Full code with pink form at bedside stating full treatment Diet: Heart healthy/carb modified Family Communication: Dated daughter-in-law, Sherial Dyas at bedside with patient's permission Disposition Plan: Pending clinical course Consults called:  Admission status: Telemetry medical, inpatient  Past Medical History:  Diagnosis Date   Anemia    Diabetes mellitus without complication (HCC)    diet controlled   Diabetic neuropathy (HCC)    Dysrhythmia    Hypertension    Hypothyroidism    Past Surgical History:  Procedure Laterality Date   CATARACT EXTRACTION W/PHACO Right 01/28/2019   Procedure: CATARACT EXTRACTION PHACO AND INTRAOCULAR LENS PLACEMENT (IOC) RIGHT DIABETES;  Surgeon: Elliot Cousin, MD;  Location: [facility] ORS;  Service: Ophthalmology;  Laterality: Right;  [place] 00:49.3 CDE 8.12 Fluid Pack Lot # [id number]   CATARACT EXTRACTION  W/PHACO Left 02/25/2019   Procedure: CATARACT EXTRACTION PHACO AND INTRAOCULAR LENS PLACEMENT (IOC), LEFT, DIABETIC, VISION BLUE;  Surgeon: Elliot Cousin, MD;  Location: [facility] ORS;  Service: Ophthalmology;  Laterality: Left;  [place]  01:10 CDE 11.43 Fluid pack lot # [id number]   FRACTURE SURGERY     INTRAMEDULLARY (IM) NAIL INTERTROCHANTERIC Right 02/06/2023   Procedure: INTRAMEDULLARY NAILING OF RIGHT FEMUR;  Surgeon: Roby Lofts, MD;  Location: [facility] OR;  Service: Orthopedics;  Laterality: Right;   ORIF WRIST FRACTURE Right 06/28/2016   Procedure: OPEN REDUCTION INTERNAL FIXATION (ORIF) WRIST FRACTURE;  Surgeon: Kennedy Bucker, MD;  Location: [facility] ORS;  Service: Orthopedics;  Laterality: Right;   SALPINGECTOMY     Social History:  reports that she quit smoking about 64 years ago. She has never used smokeless tobacco. She reports that she does not currently use alcohol. She reports that she does not use drugs.  No Known Allergies Family History  Problem Relation Age of Onset   Breast cancer Neg Hx    Family history: Family history reviewed and not pertinent.  Prior to Admission medications   Medication Sig Start Date End Date Taking? Authorizing Provider  acetaminophen (TYLENOL) 325 MG tablet Take 1-2 tablets (325-650 mg total) by mouth every 6 (six) hours as needed for mild pain (pain score 1-3 or temp > 100.5). 02/11/23   Rodolph Bong, MD  amLODipine (NORVASC) 5 MG tablet Take 5 mg by mouth daily.    [provider]  apixaban (ELIQUIS) 2.5 MG TABS tablet  Take 1 tablet (2.5 mg total) by mouth 2 (two) times daily. 01/27/23   Arnetha Courser, MD  Ascorbic Acid (VITAMIN C) 1000 MG tablet Take 1,000 mg by mouth daily at 12 noon.    [provider]  cholecalciferol (VITAMIN D3) 25 MCG (1000 UT) tablet Take 1,000 Units by mouth daily at 12 noon.    [provider]  feeding supplement (ENSURE ENLIVE / ENSURE PLUS) LIQD Take 237 mLs by mouth daily. 02/11/23   Rodolph Bong,  MD  furosemide (LASIX) 20 MG tablet Take 20 mg by mouth daily. 02/25/23   [provider]  hydrALAZINE (APRESOLINE) 50 MG tablet Take 50 mg by mouth 2 (two) times a day.    [provider]  levothyroxine (SYNTHROID, LEVOTHROID) 88 MCG tablet Take 88 mcg by mouth daily.  12/05/15   [provider]  MEGARED OMEGA-3 KRILL OIL PO Take 750 mg by mouth daily at 12 noon. Patient not taking: Reported on 03/03/2023    [provider]  metFORMIN (GLUCOPHAGE) 500 MG tablet Take 500 mg by mouth 2 (two) times daily.    [provider]  methocarbamol (ROBAXIN) 500 MG tablet Take 1 tablet (500 mg total) by mouth every 6 (six) hours as needed for muscle spasms. 02/11/23   Rodolph Bong, MD  metoprolol succinate (TOPROL-XL) 50 MG 24 hr tablet Take 1 tablet (50 mg total) by mouth daily. Take with or immediately following a meal. 03/03/23 06/01/23  Furth, Cadence H, PA-C  oxyCODONE (OXY IR/ROXICODONE) 5 MG immediate release tablet Take 0.5 tablets (2.5 mg total) by mouth every 4 (four) hours as needed for breakthrough pain ((for MODERATE breakthrough pain)). 02/11/23   [place], PA-C  polyethylene glycol (MIRALAX / GLYCOLAX) 17 g packet Take 17 g by mouth daily as needed for mild constipation. 02/10/23   Rodolph Bong, MD   Physical Exam: Vitals:   04/05/23 1418 04/05/23 1424 04/05/23 1430 04/05/23 1432  BP:   (!) 150/91   Pulse:  95  94  Resp:  19  13  Temp: 98.3 F (36.8 C)     TempSrc: Oral     SpO2:  100%  99%  Weight:      Height:       Constitutional: appears appropriate, frail, NAD, calm Eyes: PERRL, lids and conjunctivae normal ENMT: Mucous membranes are moist. Posterior pharynx clear of any exudate or lesions. Age-appropriate dentition. Hearing appropriate Neck: normal, supple, no masses, no thyromegaly Respiratory: clear to auscultation bilaterally, no wheezing, no crackles. Normal respiratory effort. No accessory muscle use.  Cardiovascular:  Regular rate and rhythm, no murmurs / rubs / gallops.  Bilateral lower extremity 2+ pitting edema. 2+ pedal pulses. No carotid bruits.  Abdomen: no tenderness, no masses palpated, no hepatosplenomegaly. Bowel sounds positive.  Musculoskeletal: no clubbing / cyanosis. No joint deformity upper and lower extremities. Good ROM, no contractures, no atrophy. Normal muscle tone.  Skin: no rashes, lesions, ulcers. No induration Neurologic: Sensation intact. Strength 5/5 in all 4.  Psychiatric: Normal judgment and insight. Alert and oriented x 3. Normal mood.   EKG: independently reviewed, showing atrial fibrillation with a rate of 108, QTc 490  Chest x-ray on Admission: Not indicated at this time  Labs on Admission: I have personally reviewed following labs  CBC: Recent Labs  Lab 04/05/23 0912  WBC 5.4  NEUTROABS 3.1  HGB 14.8  HCT 45.2  MCV 90.4  PLT 315   Basic Metabolic Panel: Recent Labs  Lab 04/05/23 0912  NA 138  K 4.0  CL 103  CO2 23  GLUCOSE 172*  BUN 20  CREATININE 0.71  CALCIUM 9.3   GFR: Estimated Creatinine Clearance: 41.2 mL/min (by C-G formula based on SCr of 0.71 mg/dL).  Liver Function Tests: Recent Labs  Lab 04/05/23 0912  AST 23  ALT 12  ALKPHOS 102  BILITOT 0.8  PROT 7.9  ALBUMIN 4.0   Recent Labs  Lab 04/05/23 0912  LIPASE 28   Urine analysis:    Component Value Date/Time   COLORURINE YELLOW (A) 04/05/2023 0938   APPEARANCEUR CLOUDY (A) 04/05/2023 0938   LABSPEC 1.004 (L) 04/05/2023 0938   PHURINE 7.0 04/05/2023 0938   GLUCOSEU NEGATIVE 04/05/2023 0938   HGBUR SMALL (A) 04/05/2023 0938   BILIRUBINUR NEGATIVE 04/05/2023 0938   KETONESUR NEGATIVE 04/05/2023 0938   PROTEINUR NEGATIVE 04/05/2023 0938   NITRITE NEGATIVE 04/05/2023 0938   LEUKOCYTESUR LARGE (A) 04/05/2023 0938   This document was prepared using Dragon Voice Recognition software and may include unintentional dictation errors.  Dr. Sedalia Muta [facility]  If 7PM-7AM,  please contact overnight-coverage provider If 7AM-7PM, please contact day attending provider [email or web address]  04/05/2023, 2:47 PM

## 2023-04-05 NOTE — Assessment & Plan Note (Addendum)
Patient is at baseline or improved hemoglobin without signs of volume contraction/dehydration Home Eliquis 2.5 mg p.o. twice daily, metoprolol succinate 50 mg daily were resumed

## 2023-04-05 NOTE — Consult Note (Signed)
CODE SEPSIS - PHARMACY COMMUNICATION  **Broad Spectrum Antibiotics should be administered within 1 hour of Sepsis diagnosis**  Time Code Sepsis Called/Page Received: 1014  Antibiotics Ordered: ceftriaxone  Time of 1st antibiotic administration: 1046  Additional action taken by pharmacy: None      Ronnald Ramp ,PharmD Clinical Pharmacist  04/05/2023  10:50 AM

## 2023-04-05 NOTE — ED Triage Notes (Signed)
Pt brought in by EMS from [facility] for bloody stools since this A.M.

## 2023-04-05 NOTE — ED Notes (Addendum)
Unable to collect blood cultures due to pt being a hard stick. Lab called to collect blood work

## 2023-04-05 NOTE — Assessment & Plan Note (Signed)
History of small volume ischemic infarct in the occipital lobes (L > R) and right posterior splenium in 01/26/2023

## 2023-04-05 NOTE — Assessment & Plan Note (Addendum)
Patient takes levothyroxine 88 mcg daily daily before breakfast resumed

## 2023-04-05 NOTE — Assessment & Plan Note (Signed)
Pan colonic diverticulosis

## 2023-04-05 NOTE — ED Notes (Signed)
Advised nurse that patient has ready bed 

## 2023-04-05 NOTE — ED Notes (Signed)
Mumma, MD, made aware of critical lactic 2.7

## 2023-04-05 NOTE — Assessment & Plan Note (Signed)
Hydralazine 5 mg IV every 6 hours as needed for SBP greater than 165, 10 days ordered

## 2023-04-05 NOTE — Sepsis Progress Note (Addendum)
Notified bedside nurse of need to draw repeat lactic acid per sepsis protocol since the 2nd LA was higher than the first.

## 2023-04-05 NOTE — Sepsis Progress Note (Signed)
Sepsis protocol is being followed by [facility]. 

## 2023-04-05 NOTE — ED Notes (Signed)
Pt incontinent of bowel. Pt cleaned and placed in new brief and gown. Fresh linens on bed. No other needs at this time.

## 2023-04-05 NOTE — Assessment & Plan Note (Signed)
Patient reports that she has gained a little bit of weight over the last 2 weeks and she denies shortness of breath or chest pain Of note, patient was discontinued on her home scheduled Lasix about 2 weeks ago Strict I's and O's, patient may benefit from Lasix 20 mg as needed for weight gain and swelling of her legs

## 2023-04-05 NOTE — Plan of Care (Signed)
  Problem: Education: Goal: Knowledge of disease or condition will improve Outcome: Progressing Goal: Knowledge of secondary prevention will improve (MUST DOCUMENT ALL) Outcome: Progressing Goal: Knowledge of patient specific risk factors will improve (Mark N/A or DELETE if not current risk factor) Outcome: Progressing   Problem: Ischemic Stroke/TIA Tissue Perfusion: Goal: Complications of ischemic stroke/TIA will be minimized Outcome: Progressing   Problem: Coping: Goal: Will verbalize positive feelings about self Outcome: Progressing Goal: Will identify appropriate support needs Outcome: Progressing   Problem: Health Behavior/Discharge Planning: Goal: Ability to manage health-related needs will improve Outcome: Progressing Goal: Goals will be collaboratively established with patient/family Outcome: Progressing   Problem: Self-Care: Goal: Ability to participate in self-care as condition permits will improve Outcome: Progressing Goal: Verbalization of feelings and concerns over difficulty with self-care will improve Outcome: Progressing Goal: Ability to communicate needs accurately will improve Outcome: Progressing   Problem: Nutrition: Goal: Risk of aspiration will decrease Outcome: Progressing Goal: Dietary intake will improve Outcome: Progressing   Problem: Fluid Volume: Goal: Hemodynamic stability will improve Outcome: Progressing   Problem: Clinical Measurements: Goal: Diagnostic test results will improve Outcome: Progressing Goal: Signs and symptoms of infection will decrease Outcome: Progressing   Problem: Respiratory: Goal: Ability to maintain adequate ventilation will improve Outcome: Progressing   Problem: Education: Goal: Ability to describe self-care measures that may prevent or decrease complications (Diabetes Survival Skills Education) will improve Outcome: Progressing Goal: Individualized Educational Video(s) Outcome: Progressing   Problem:  Coping: Goal: Ability to adjust to condition or change in health will improve Outcome: Progressing   Problem: Fluid Volume: Goal: Ability to maintain a balanced intake and output will improve Outcome: Progressing   Problem: Health Behavior/Discharge Planning: Goal: Ability to identify and utilize available resources and services will improve Outcome: Progressing Goal: Ability to manage health-related needs will improve Outcome: Progressing   Problem: Metabolic: Goal: Ability to maintain appropriate glucose levels will improve Outcome: Progressing   Problem: Nutritional: Goal: Maintenance of adequate nutrition will improve Outcome: Progressing Goal: Progress toward achieving an optimal weight will improve Outcome: Progressing   Problem: Skin Integrity: Goal: Risk for impaired skin integrity will decrease Outcome: Progressing   Problem: Tissue Perfusion: Goal: Adequacy of tissue perfusion will improve Outcome: Progressing   Problem: Education: Goal: Knowledge of General Education information will improve Description: Including pain rating scale, medication(s)/side effects and non-pharmacologic comfort measures Outcome: Progressing   Problem: Health Behavior/Discharge Planning: Goal: Ability to manage health-related needs will improve Outcome: Progressing   Problem: Clinical Measurements: Goal: Ability to maintain clinical measurements within normal limits will improve Outcome: Progressing Goal: Will remain free from infection Outcome: Progressing Goal: Diagnostic test results will improve Outcome: Progressing Goal: Respiratory complications will improve Outcome: Progressing Goal: Cardiovascular complication will be avoided Outcome: Progressing   Problem: Activity: Goal: Risk for activity intolerance will decrease Outcome: Progressing   Problem: Nutrition: Goal: Adequate nutrition will be maintained Outcome: Progressing   Problem: Coping: Goal: Level of  anxiety will decrease Outcome: Progressing   Problem: Elimination: Goal: Will not experience complications related to bowel motility Outcome: Progressing Goal: Will not experience complications related to urinary retention Outcome: Progressing   Problem: Pain Managment: Goal: General experience of comfort will improve Outcome: Progressing   Problem: Safety: Goal: Ability to remain free from injury will improve Outcome: Progressing   Problem: Skin Integrity: Goal: Risk for impaired skin integrity will decrease Outcome: Progressing   

## 2023-04-05 NOTE — Progress Notes (Signed)
Patient arrived to room 210. Assisted patient from stretcher to bed. Tele monitor placed and verified.   Madie Reno, RN

## 2023-04-05 NOTE — ED Provider Notes (Addendum)
[facility] Provider Note    Event Date/Time   First MD Initiated Contact with Patient 04/05/23 0900     (approximate)   History   Rectal Bleeding   HPI  Barbara Butler is a 89 y.o. female past medical history significant for hypertension, hyperlipidemia, diabetes, anemia, atrial fibrillation on Eliquis, recent CVA, presents to the emergency department with blood in her stool.  Patient presents from nursing facility when they noted dark red blood per rectum.  1 episode this morning.  Sent in for further evaluation.  States that she has some mild confusion at baseline.  Has a MOST form with her and she is full code.  Patient states that she was told that she had blood in her stool but otherwise has been feeling okay.  Denies any chest pain or shortness of breath.  Denies any abdominal pain, nausea or vomiting.  Does endorse urinary urgency and frequency.     Physical Exam   Triage Vital Signs: ED Triage Vitals  Encounter Vitals Group     BP      Systolic BP Percentile      Diastolic BP Percentile      Pulse      Resp      Temp      Temp src      SpO2      Weight      Height      Head Circumference      Peak Flow      Pain Score      Pain Loc      Pain Education      Exclude from Growth Chart     Most recent vital signs: Vitals:   04/05/23 0902 04/05/23 0930  BP: (!) 173/102 (!) 154/95  Pulse: (!) 117 (!) 102  Resp: 18 19  Temp: 98.3 F (36.8 C)   SpO2: 99% 98%    Physical Exam Exam conducted with a chaperone present.  Constitutional:      Appearance: She is well-developed.  HENT:     Head: Atraumatic.  Eyes:     Conjunctiva/sclera: Conjunctivae normal.  Cardiovascular:     Rate and Rhythm: Tachycardia present. Rhythm irregular.  Pulmonary:     Effort: No respiratory distress.  Abdominal:     General: There is no distension.     Tenderness: There is abdominal tenderness.  Genitourinary:    Comments: No melena.  Bright red  blood with stool.  No obvious external hemorrhoid, fissures or tears Musculoskeletal:        General: Normal range of motion.     Cervical back: Normal range of motion.  Skin:    General: Skin is warm.  Neurological:     Mental Status: She is alert. Mental status is at baseline.     IMPRESSION / MDM / ASSESSMENT AND PLAN / ED COURSE  I reviewed the triage vital signs and the nursing notes.  Differential diagnosis including upper GI bleed, lower GI bleed, urinary tract infection, dehydration  Type and screened  EKG  I, Corena Herter, the attending physician, personally viewed and interpreted this ECG.   Rate: 108  Rhythm: Atrial fibrillation  Axis: Normal  Intervals: Normal  ST&T Change: Nonspecific ST changes  Atrial fibrillation with a rapid rate while on cardiac telemetry.  RADIOLOGY I independently reviewed imaging, my interpretation of imaging: CTA abdomen and pelvis -on my review no findings of acute intra-abdominal pathology.  LABS (all labs ordered  are listed, but only abnormal results are displayed) Labs interpreted as -    Labs Reviewed  COMPREHENSIVE METABOLIC PANEL - Abnormal; Notable for the following components:      Result Value   Glucose, Bld 172 (*)    All other components within normal limits  URINALYSIS, W/ REFLEX TO CULTURE (INFECTION SUSPECTED) - Abnormal; Notable for the following components:   Color, Urine YELLOW (*)    APPearance CLOUDY (*)    Specific Gravity, Urine 1.004 (*)    Hgb urine dipstick SMALL (*)    Leukocytes,Ua LARGE (*)    Bacteria, UA FEW (*)    All other components within normal limits  LACTIC ACID, PLASMA - Abnormal; Notable for the following components:   Lactic Acid, Venous 2.7 (*)    All other components within normal limits  URINE CULTURE  CULTURE, BLOOD (ROUTINE X 2)  CULTURE, BLOOD (ROUTINE X 2)  CBC WITH DIFFERENTIAL/PLATELET  LIPASE, BLOOD  LACTIC ACID, PLASMA  PROTIME-INR  TYPE AND SCREEN  TYPE AND SCREEN      MDM  UA concerning for urinary tract infection.  Given her tachycardia added on blood cultures and will start the patient on antibiotics to cover for urinary tract infection.  Felt that 30 cc/kg of IV fluids may be detrimental to the patient given her GI bleed, will give a 500 bolus and reevaluate.  Another episode of diarrhea in the emergency department.  No significant gross blood or melena.  Lactic acid elevated at 2.7.  Given another 1 L of IV fluids.  Started on IV Rocephin to cover for urinary tract infection.  Hemoglobin stable.  Delay in blood cultures, difficult IV stick, felt it would be detrimental to delay antibiotics for blood cultures.  Consulted hospitalist for admission     PROCEDURES:  Critical Care performed: yes  .Critical Care  Performed by: Corena Herter, MD Authorized by: Corena Herter, MD   Critical care provider statement:    Critical care time (minutes):  30   Critical care time was exclusive of:  Separately billable procedures and treating other patients   Critical care was necessary to treat or prevent imminent or life-threatening deterioration of the following conditions:  Sepsis and circulatory failure   Critical care was time spent personally by me on the following activities:  Development of treatment plan with patient or surrogate, discussions with consultants, evaluation of patient's response to treatment, examination of patient, ordering and review of laboratory studies, ordering and review of radiographic studies, ordering and performing treatments and interventions, pulse oximetry, re-evaluation of patient's condition and review of old charts   Patient's presentation is most consistent with acute presentation with potential threat to life or bodily function.   MEDICATIONS ORDERED IN ED: Medications  lactated ringers infusion (has no administration in time range)  cefTRIAXone (ROCEPHIN) 2 g in sodium chloride 0.9 % 100 mL IVPB (2 g  Intravenous New Bag/Given 04/05/23 1046)  sodium chloride 0.9 % bolus 500 mL (0 mLs Intravenous Stopped 04/05/23 1046)  lactated ringers bolus 1,000 mL (1,000 mLs Intravenous New Bag/Given 04/05/23 1114)  iohexol (OMNIPAQUE) 350 MG/ML injection 75 mL (75 mLs Intravenous Contrast Given 04/05/23 1141)    FINAL CLINICAL IMPRESSION(S) / ED DIAGNOSES   Final diagnoses:  Rectal bleeding  Acute cystitis without hematuria     Rx / DC Orders   ED Discharge Orders     None        Note:  This document was prepared using Dragon voice  recognition software and may include unintentional dictation errors.   Corena Herter, MD 04/05/23 1050    Corena Herter, MD 04/05/23 1052    Corena Herter, MD 04/05/23 1201

## 2023-04-05 NOTE — Assessment & Plan Note (Signed)
Without evidence of cholecystitis

## 2023-04-05 NOTE — Assessment & Plan Note (Signed)
Patient is on metformin 500 mg p.o. twice daily; this is not resumed on admission Last A1c was 01/27/2023 and it was 7.9 Insulin SSI with at bedtime coverage ordered Goal inpatient blood glucose levels 140-180

## 2023-04-06 DIAGNOSIS — N390 Urinary tract infection, site not specified: Secondary | ICD-10-CM | POA: Diagnosis not present

## 2023-04-06 DIAGNOSIS — A419 Sepsis, unspecified organism: Secondary | ICD-10-CM | POA: Diagnosis not present

## 2023-04-06 LAB — CBC
HCT: 35.8 % — ABNORMAL LOW (ref 36.0–46.0)
HCT: 40.2 % (ref 36.0–46.0)
Hemoglobin: 12.3 g/dL (ref 12.0–15.0)
Hemoglobin: 13.2 g/dL (ref 12.0–15.0)
MCH: 29.9 pg (ref 26.0–34.0)
MCH: 29.4 pg (ref 26.0–34.0)
MCHC: 34.4 g/dL (ref 30.0–36.0)
MCHC: 32.8 g/dL (ref 30.0–36.0)
MCV: 86.9 fL (ref 80.0–100.0)
MCV: 89.5 fL (ref 80.0–100.0)
Platelets: 256 10*3/uL (ref 150–400)
Platelets: 288 10*3/uL (ref 150–400)
RBC: 4.12 MIL/uL (ref 3.87–5.11)
RBC: 4.49 MIL/uL (ref 3.87–5.11)
RDW: 15.3 % (ref 11.5–15.5)
RDW: 15.3 % (ref 11.5–15.5)
WBC: 4.7 10*3/uL (ref 4.0–10.5)
WBC: 6 10*3/uL (ref 4.0–10.5)
nRBC: 0 % (ref 0.0–0.2)
nRBC: 0 % (ref 0.0–0.2)

## 2023-04-06 LAB — BASIC METABOLIC PANEL
Anion gap: 9 (ref 5–15)
BUN: 14 mg/dL (ref 8–23)
CO2: 25 mmol/L (ref 22–32)
Calcium: 8.8 mg/dL — ABNORMAL LOW (ref 8.9–10.3)
Chloride: 105 mmol/L (ref 98–111)
Creatinine, Ser: 0.57 mg/dL (ref 0.44–1.00)
GFR, Estimated: >60 mL/min (ref >60)
Glucose, Bld: 147 mg/dL — ABNORMAL HIGH (ref 70–99)
Potassium: 3.3 mmol/L — ABNORMAL LOW (ref 3.5–5.1)
Sodium: 139 mmol/L (ref 135–145)

## 2023-04-06 LAB — GLUCOSE, CAPILLARY
Glucose-Capillary: 158 mg/dL — ABNORMAL HIGH (ref 70–99)
Glucose-Capillary: 195 mg/dL — ABNORMAL HIGH (ref 70–99)
Glucose-Capillary: 126 mg/dL — ABNORMAL HIGH (ref 70–99)
Glucose-Capillary: 135 mg/dL — ABNORMAL HIGH (ref 70–99)

## 2023-04-06 LAB — PROCALCITONIN: Procalcitonin: 0.12 ng/mL

## 2023-04-06 LAB — MAGNESIUM: Magnesium: 1.7 mg/dL (ref 1.7–2.4)

## 2023-04-06 LAB — LACTIC ACID, PLASMA: Lactic Acid, Venous: 2.3 mmol/L (ref 0.5–1.9)

## 2023-04-06 MED ORDER — INFLUENZA VAC A&B SURF ANT ADJ 0.5 ML IM SUSY
0.5000 mL | PREFILLED_SYRINGE | INTRAMUSCULAR | Status: DC
  Filled 2023-04-06 (×2): qty 0.5

## 2023-04-06 MED ORDER — PNEUMOCOCCAL 20-VAL CONJ VACC 0.5 ML IM SUSY: 0.5000 mL | PREFILLED_SYRINGE | INTRAMUSCULAR | Status: DC

## 2023-04-06 NOTE — Evaluation (Signed)
Speech Language Pathology Evaluation Patient Details Name: Barbara Butler MRN: [id number] DOB: 1930/01/26 Today's Date: 04/06/2023 Time: 1600-1700 SLP Time Calculation (min) (ACUTE ONLY): 60 min  Problem List:  Patient Active Problem List   Diagnosis Date Noted   Sepsis secondary to UTI (HCC) 04/05/2023   Diverticulosis of colon 04/05/2023   Cholelithiasis 04/05/2023   Leg swelling 04/05/2023   Fall 02/06/2023   Fall at nursing home, initial encounter 02/05/2023   Closed right hip fracture, initial encounter (HCC) 02/05/2023   Humeral surgical neck fracture 02/05/2023   DNR (do not resuscitate) 02/05/2023   Atrial flutter (HCC) 01/28/2023   Atrial fibrillation/flutter (HCC) 01/28/2023   Cerebrovascular accident (CVA) (HCC) 01/27/2023   Hypertension 01/26/2023   Hypothyroidism 01/26/2023   Confusion and disorientation / subacute cva. 01/26/2023   Type 2 diabetes mellitus with hyperglycemia, without long-term current use of insulin (HCC) 08/31/2019   Past Medical History:  Past Medical History:  Diagnosis Date   Anemia    Diabetes mellitus without complication (HCC)    diet controlled   Diabetic neuropathy (HCC)    Dysrhythmia    Hypertension    Hypothyroidism    Past Surgical History:  Past Surgical History:  Procedure Laterality Date   CATARACT EXTRACTION W/PHACO Right 01/28/2019   Procedure: CATARACT EXTRACTION PHACO AND INTRAOCULAR LENS PLACEMENT (IOC) RIGHT DIABETES;  Surgeon: Elliot Cousin, MD;  Location: [facility] ORS;  Service: Ophthalmology;  Laterality: Right;  [place] 00:49.3 CDE 8.12 Fluid Pack Lot # [id number]   CATARACT EXTRACTION W/PHACO Left 02/25/2019   Procedure: CATARACT EXTRACTION PHACO AND INTRAOCULAR LENS PLACEMENT (IOC), LEFT, DIABETIC, VISION BLUE;  Surgeon: Elliot Cousin, MD;  Location: [facility] ORS;  Service: Ophthalmology;  Laterality: Left;  [place]  01:10 CDE 11.43 Fluid pack lot # [id number]   FRACTURE SURGERY     INTRAMEDULLARY (IM) NAIL INTERTROCHANTERIC Right  02/06/2023   Procedure: INTRAMEDULLARY NAILING OF RIGHT FEMUR;  Surgeon: Roby Lofts, MD;  Location: [facility] OR;  Service: Orthopedics;  Laterality: Right;   ORIF WRIST FRACTURE Right 06/28/2016   Procedure: OPEN REDUCTION INTERNAL FIXATION (ORIF) WRIST FRACTURE;  Surgeon: Kennedy Bucker, MD;  Location: [facility] ORS;  Service: Orthopedics;  Laterality: Right;   SALPINGECTOMY     HPI:  Pt is a 89yo woman w/ PMH of min HOH, close right hip proximal femoral fracture, history of proximal right humeral neck, CVA and recent Rehab admit/stay, HTN, DM, HLD, CAD w/ recent admit on 01/26/23 and was dx'd w/ new onset Afib. MRI Brain then showed small scattered bilateral acute infarcts suspicious for embolic etiology, possibly due to Afib.  Head CT also revealed: "Generalized atrophy. Fairly extensive (moderate to advanced)  chronic microvascular ischemic disease chronic small vessel ischemic low-density in the cerebral white matter and deep gray nuclei.".  At this admit, pt is presenting with c/o blood in stool. Pt is being admitted from Her Rehab place for sepsis 2/2 UTI.  Per abd. scan: Lower Chest: No acute findings. Mild bibasilar fibrosis.   Assessment / Plan / Recommendation Clinical Impression   Pt seen for cognitive communication evaluation per MD order. Assessment consisted of pt/family (daughter-in-law present for duration of eval) interview and completion of [facility] SLUMS Examination. A score of 20/30 was obtained on the SLUMS assessment. Pt presents with neurocognitive communication deficit primarily c/b moderately impaired recall, fluency, and executive function. Orientation was [facility]. Verbal cues (binary options and semantic cues) and repetition bolstered cognitive processing and recall; accuracy. Expressive/receptive language and basic tasks requiring sustained attention were  WFL during assessment; sequencing and reasoning tasks were [facility] also. Motor speech WFL. Pt with awareness of current deficits reporting "always  having trouble w/ my memory". Pt demonstrated good follow through w/ instructions w/ repetition of information and/or verbal cues; best supported by giving Time for practice of strategies and reducing distractions in room/environment(ie; tv turned off; door closed). Similar presentation was seen last admit in 01/2023 on informal assessment at bedside.   Recommend assessment post D/C to next venue of care, especially when/if returning home alone, to determine approximation to baseline and any follow-up tx and Supervision needs/recommendations. Education provided to both pt and [name] present on communication strategies to enhance verbal communication and recall of information in safety situations when needed.        Anticipate need for Supervision with cognitive-communication in ADLs in next setting working towards IADLs at D/C. ST services recommends follow up for Cognitive-communication tx w/in structured, daily ADLs at SNF and/or in the Home for ongoing tx. Pt would benefit from Supervision and assistance with communication strategies in communication ADLS. Acute needs have been identified, met currently. MD to reconsult if any new needs arise w/in stay at the hospital.  Recommend ongoing assessment and f/u at next venue as well as Neurology consultation for formal Cognitive assessment as part of the POC at D/C. Discussed the above w/ pt and [name] who agreed. MD/NSG/TOC updated.     SLP Assessment  SLP Recommendation/Assessment: All further Speech Lanaguage Pathology  needs can be addressed in the next venue of care SLP Visit Diagnosis: Cognitive communication deficit (R41.841)    Recommendations for follow up therapy are one component of a multi-disciplinary discharge planning process, led by the attending physician.  Recommendations may be updated based on patient status, additional functional criteria and insurance authorization.    Follow Up Recommendations  Follow  physician's recommendations for discharge plan and follow up therapies (ST f/u at next venue of care)    Assistance Recommended at Discharge  Intermittent Supervision/Assistance  Functional Status Assessment Patient has had a recent decline in their functional status and demonstrates the ability to make significant improvements in function in a reasonable and predictable amount of time.  Frequency and Duration  (n/a)   (n/a)      SLP Evaluation Cognition  Overall Cognitive Status: History of cognitive impairments - at baseline Arousal/Alertness: Awake/alert Orientation Level: Oriented X4 Year: 2024 (self-corrected from 1924) Month: September Day of Week: Correct Attention: Focused;Sustained Focused Attention: Appears intact Sustained Attention: Appears intact Memory: Impaired Memory Impairment: Decreased recall of new information;Decreased short term memory Executive Function: Reasoning;Sequencing Reasoning: Appears intact Sequencing: Appears intact Behaviors:  (none) Safety/Judgment: Appears intact (grossly)       Comprehension  Auditory Comprehension Overall Auditory Comprehension: Appears within functional limits for tasks assessed Yes/No Questions: Within Functional Limits Commands: Within Functional Limits Conversation: Simple Other Conversation Comments: min HOH Interfering Components: Hearing EffectiveTechniques: Repetition;Stressing words Visual Recognition/Discrimination Discrimination: Not tested Reading Comprehension Reading Status: Not tested    Expression Expression Primary Mode of Expression: Verbal Verbal Expression Overall Verbal Expression: Appears within functional limits for tasks assessed Initiation: No impairment Automatic Speech:  (WFL) Level of Generative/Spontaneous Verbalization: Sentence Naming: No impairment Pragmatics: No impairment Interfering Components:  (none) Non-Verbal Means of Communication: Not applicable Written  Expression Dominant Hand: Right Written Expression: Not tested   Oral / Motor  Oral Motor/Sensory Function Overall Oral Motor/Sensory Function: Within functional limits Motor Speech Overall Motor Speech: Appears within functional limits for tasks assessed Respiration: Within  functional limits Phonation: Normal Resonance: Within functional limits Articulation: Within functional limitis Intelligibility: Intelligible              Jerilynn Som, MS, CCC-SLP Speech Language Pathologist Rehab Services; [facility] [phone] (ascom) Maclean Foister 04/06/2023, 5:20 PM

## 2023-04-06 NOTE — Progress Notes (Addendum)
Progress Note    Barbara Butler  ZOX:[id number] DOB: [birth year]  DOA: 04/05/2023 PCP: [name], MD      Brief Narrative:    Medical records reviewed and are as summarized below:  Barbara Butler is a 89 y.o. female with history of close right hip proximal femoral fracture, history of proximal right humeral neck, history of CVA and atrial fibrillation on Eliquis, hyperlipidemia, non-insulin-dependent diabetes mellitus, hypothyroid, hypertension, who was brought to the hospital because of concern for rectal bleeding.  Patient said that she was informed by the nursing staff at the nursing facility that she had bloody stools but she did not see a stools herself.  Per Dr. Jefferey Butler (ED physician) note patient had an episode of diarrhea in the emergency department but there was no gross blood or melena.     Assessment/Plan:   Principal Problem:   Sepsis secondary to UTI [facility]) Active Problems:   Type 2 diabetes mellitus with hyperglycemia, without long-term current use of insulin (HCC)   Hypertension   Hypothyroidism   Cerebrovascular accident (CVA) (HCC)   Atrial fibrillation/flutter (HCC)   Humeral surgical neck fracture   Diverticulosis of colon   Cholelithiasis   Leg swelling   Body mass index is 22.64 kg/m.   Probable severe sepsis secondary to acute UTI: She had respiratory rate up to 29 and heart rate up mostly greater than 90 and sometimes greater than 100. Lactic acid was 2.7-3.9-2.8. Continue IV ceftriaxone.  Follow-up urine and blood cultures.  Repeat lactic acid and check procalcitonin.   Atrial fibrillation with RVR: Continue metoprolol.  Hold Eliquis because of concern for bloody stool.   ? Mild rectal bleeding: Hold Eliquis.  Monitor H&H. Nurse reported patient had red streaks of blood in her stool today.   Hypokalemia: Replete potassium and monitor levels.  Check magnesium and replete as needed.   Recent IM nailing of right femur on  02/06/2023, recent fracture of surgical neck of the right humerus   Other comorbidities include hypertension, hypothyroidism, colonic diverticulosis, cholelithiasis   Diet Order             Diet heart healthy/carb modified Fluid consistency: Thin  Diet effective now                            Consultants: None  Procedures: None    Medications:    amLODipine  5 mg Oral Daily   apixaban  2.5 mg Oral BID   vitamin C  1,000 mg Oral Q1200   cholecalciferol  1,000 Units Oral Q1200   feeding supplement  237 mL Oral Q24H   hydrALAZINE  50 mg Oral BID   influenza vaccine adjuvanted  0.5 mL Intramuscular Tomorrow-1000   insulin aspart  0-5 Units Subcutaneous QHS   insulin aspart  0-9 Units Subcutaneous TID WC   levothyroxine  88 mcg Oral QAC breakfast   metoprolol succinate  50 mg Oral Daily   pneumococcal 20-valent conjugate vaccine  0.5 mL Intramuscular Tomorrow-1000   Continuous Infusions:  cefTRIAXone (ROCEPHIN)  IV 2 g (04/06/23 0906)     Anti-infectives (From admission, onward)    Start     Dose/Rate Route Frequency Ordered Stop   04/05/23 1015  cefTRIAXone (ROCEPHIN) 2 g in sodium chloride 0.9 % 100 mL IVPB        2 g 200 mL/hr over 30 Minutes Intravenous Every 24 hours 04/05/23 1007 04/12/23 1014  Family Communication/Anticipated D/C date and plan/Code Status   DVT prophylaxis: apixaban (ELIQUIS) tablet 2.5 mg Start: 04/05/23 2200 Place TED hose Start: 04/05/23 1220 apixaban (ELIQUIS) tablet 2.5 mg     Code Status: Full Code  Family Communication: None Disposition Plan: Plan to discharge to SNF   Status is: Inpatient Remains inpatient appropriate because: UTI       Subjective:   Interval events noted. She said she was told by nursing staff at the skilled nursing facility that she had bloody stools but she did not see it herself.  She complains of general weakness. Nursing staff reported that patient had red streaks  of blood in her stool today. No dizziness, abdominal pain, vomiting, burning urination or frequent urination at this time.  Objective:    Vitals:   04/05/23 1510 04/05/23 2020 04/06/23 0502 04/06/23 0737  BP: (!) 169/90 (!) 143/89 (!) 155/99 (!) 178/92  Pulse: 100 98 91 (!) 101  Resp: 18 17 20 16   Temp: 98.1 F (36.7 C) 98.3 F (36.8 C) 98.7 F (37.1 C) 98.5 F (36.9 C)  TempSrc: Oral Oral  Oral  SpO2: 99% 98% 98% 99%  Weight:      Height:       No data found.   Intake/Output Summary (Last 24 hours) at 04/06/2023 1245 Last data filed at 04/06/2023 1100 Gross per 24 hour  Intake 3728.38 ml  Output 950 ml  Net 2778.38 ml   Filed Weights   04/05/23 0906  Weight: 61.7 kg    Exam:  GEN: NAD SKIN: Warm and dry EYES: No pallor or icterus ENT: MMM CV: Irregular rate and rhythm PULM: CTA B ABD: soft, ND, NT, +BS CNS: AAO x 3, non focal EXT: Bilateral leg edema, no tenderness        Data Reviewed:   I have personally reviewed following labs and imaging studies:  Labs: Labs show the following:   Basic Metabolic Panel: Recent Labs  Lab 04/05/23 0912 04/06/23 0356  NA 138 139  K 4.0 3.3*  CL 103 105  CO2 23 25  GLUCOSE 172* 147*  BUN 20 14  CREATININE 0.71 0.57  CALCIUM 9.3 8.8*   GFR Estimated Creatinine Clearance: 41.2 mL/min (by C-G formula based on SCr of 0.57 mg/dL). Liver Function Tests: Recent Labs  Lab 04/05/23 0912  AST 23  ALT 12  ALKPHOS 102  BILITOT 0.8  PROT 7.9  ALBUMIN 4.0   Recent Labs  Lab 04/05/23 0912  LIPASE 28   No results for input(s): "AMMONIA" in the last 168 hours. Coagulation profile Recent Labs  Lab 04/05/23 1249  INR 1.4*    CBC: Recent Labs  Lab 04/05/23 0912 04/06/23 0356  WBC 5.4 4.7  NEUTROABS 3.1  --   HGB 14.8 12.3  HCT 45.2 35.8*  MCV 90.4 86.9  PLT 315 256   Cardiac Enzymes: No results for input(s): "CKTOTAL", "CKMB", "CKMBINDEX", "TROPONINI" in the last 168 hours. BNP (last 3  results) No results for input(s): "PROBNP" in the last 8760 hours. CBG: Recent Labs  Lab 04/05/23 1518 04/05/23 2140 04/06/23 0739 04/06/23 1144  GLUCAP 146* 158* 158* 195*   D-Dimer: No results for input(s): "DDIMER" in the last 72 hours. Hgb A1c: No results for input(s): "HGBA1C" in the last 72 hours. Lipid Profile: No results for input(s): "CHOL", "HDL", "LDLCALC", "TRIG", "CHOLHDL", "LDLDIRECT" in the last 72 hours. Thyroid function studies: No results for input(s): "TSH", "T4TOTAL", "T3FREE", "THYROIDAB" in the last 72 hours.  Invalid input(s): "FREET3" Anemia work up: No results for input(s): "VITAMINB12", "FOLATE", "FERRITIN", "TIBC", "IRON", "RETICCTPCT" in the last 72 hours. Sepsis Labs: Recent Labs  Lab 04/05/23 0912 04/05/23 1249 04/05/23 1555 04/06/23 0356  WBC 5.4  --   --  4.7  LATICACIDVEN 2.7* 3.9* 2.8*  --     Microbiology Recent Results (from the past 240 hour(s))  Blood culture (routine x 2)     Status: None (Preliminary result)   Collection Time: 04/05/23 12:51 PM   Specimen: BLOOD  Result Value Ref Range Status   Specimen Description BLOOD BLOOD LEFT HAND  Final   Special Requests   Final    BOTTLES DRAWN AEROBIC AND ANAEROBIC Blood Culture adequate volume   Culture   Final    NO GROWTH < 24 HOURS Performed at [facility], [address] [place], [place] [address]    Report Status PENDING  Incomplete  Blood culture (routine x 2)     Status: None (Preliminary result)   Collection Time: 04/05/23  3:55 PM   Specimen: BLOOD  Result Value Ref Range Status   Specimen Description BLOOD BLOOD RIGHT HAND  Final   Special Requests   Final    BOTTLES DRAWN AEROBIC ONLY Blood Culture adequate volume   Culture   Final    NO GROWTH < 24 HOURS Performed at [facility], [address], [place], [place] [address]    Report Status PENDING  Incomplete    Procedures and diagnostic studies:  CT ANGIO GI BLEED  Result Date:  04/05/2023 CLINICAL DATA:  Lower GI bleed, bloody stools since this morning, recent right hip fracture EXAM: CTA ABDOMEN AND PELVIS WITHOUT AND WITH CONTRAST TECHNIQUE: Multidetector CT imaging of the abdomen and pelvis was performed using the standard protocol during bolus administration of intravenous contrast. Multiplanar reconstructed images and MIPs were obtained and reviewed to evaluate the vascular anatomy. RADIATION DOSE REDUCTION: This exam was performed according to the departmental dose-optimization program which includes automated exposure control, adjustment of the mA and/or kV according to patient size and/or use of iterative reconstruction technique. CONTRAST:  75mL OMNIPAQUE IOHEXOL 350 MG/ML SOLN COMPARISON:  CT chest abdomen pelvis, 02/05/2023 FINDINGS: VASCULAR Normal contour and caliber of the abdominal aorta. No evidence of aneurysm, dissection, or other acute aortic pathology. Standard branching pattern of the abdominal aorta with solitary bilateral renal arteries. Moderate mixed calcific atherosclerosis. Review of the MIP images confirms the above findings. NON-VASCULAR Lower Chest: No acute findings. Mild bibasilar fibrosis. Cardiomegaly and coronary artery calcifications. Hepatobiliary: No solid liver abnormality is seen. Simple benign left lobe liver cysts, for which no further follow-up or characterization is required. Gallstones. No gallbladder wall thickening. Similar common bile duct dilatation measuring up to 1.0 cm in caliber. No calculus or other obstruction identified to the ampulla. Pancreas: Unchanged appearance of the pancreas with diffuse dilatation of the pancreatic duct to the ampulla, measuring up to 1.0 cm in caliber, with mild, diffuse pancreatic parenchymal atrophy. No solid lesion or suspicious contrast enhancement. This appearance is unchanged in comparison to examinations dating back to at least 2013 and is benign, requiring no further follow-up or characterization.  Spleen: Normal in size without significant abnormality. Adrenals/Urinary Tract: Adrenal glands are unremarkable. Simple, benign bilateral renal cortical cysts, for which no further follow-up or characterization is required. Kidneys are otherwise normal, without renal calculi, solid lesion, or hydronephrosis. Bladder is unremarkable. Stomach/Bowel: Stomach is within normal limits. Appendix appears normal. No evidence of bowel wall  thickening, distention, or inflammatory changes. Pancolonic diverticulosis. No evidence of intraluminal contrast extravasation or other findings to specifically localize GI bleeding. Lymphatic: No enlarged abdominal or pelvic lymph nodes. Reproductive: Calcified fibroids. Other: Fat containing right inguinal hernia. Anasarca. No ascites. Musculoskeletal: No acute osseous findings. Status post right hip intramedullary nail fixation with subacute intratrochanteric fractures. Unchanged superior endplate wedge deformities of L1, L2, and L3. IMPRESSION: 1. No evidence of intraluminal contrast extravasation or other findings to specifically localize GI bleeding. 2. Pancolonic diverticulosis without evidence of acute diverticulitis. 3. Cholelithiasis without evidence of acute cholecystitis. Similar common bile duct dilatation measuring up to 1.0 cm in caliber. No calculus or other obstruction identified to the ampulla. 4. Status post right hip intramedullary nail fixation with subacute intratrochanteric fractures. 5. Unchanged superior endplate wedge deformities of L1, L2, and L3. Aortic Atherosclerosis (ICD10-I70.0). Electronically Signed   By: Jearld Lesch M.D.   On: 04/05/2023 12:40               LOS: 1 day   Kanchan Gal  [facility]   Pager on [email or web address]. If 7PM-7AM, please contact night-coverage at [email or web address]     04/06/2023, 12:45 PM

## 2023-04-06 NOTE — Plan of Care (Signed)
  Problem: Education: Goal: Knowledge of disease or condition will improve Outcome: Progressing Goal: Knowledge of secondary prevention will improve (MUST DOCUMENT ALL) Outcome: Progressing Goal: Knowledge of patient specific risk factors will improve (Mark N/A or DELETE if not current risk factor) Outcome: Progressing   Problem: Ischemic Stroke/TIA Tissue Perfusion: Goal: Complications of ischemic stroke/TIA will be minimized Outcome: Progressing   Problem: Coping: Goal: Will verbalize positive feelings about self Outcome: Progressing Goal: Will identify appropriate support needs Outcome: Progressing   Problem: Health Behavior/Discharge Planning: Goal: Ability to manage health-related needs will improve Outcome: Progressing Goal: Goals will be collaboratively established with patient/family Outcome: Progressing   Problem: Self-Care: Goal: Ability to participate in self-care as condition permits will improve Outcome: Progressing Goal: Verbalization of feelings and concerns over difficulty with self-care will improve Outcome: Progressing Goal: Ability to communicate needs accurately will improve Outcome: Progressing   Problem: Nutrition: Goal: Risk of aspiration will decrease Outcome: Progressing Goal: Dietary intake will improve Outcome: Progressing   Problem: Fluid Volume: Goal: Hemodynamic stability will improve Outcome: Progressing   Problem: Clinical Measurements: Goal: Diagnostic test results will improve Outcome: Progressing Goal: Signs and symptoms of infection will decrease Outcome: Progressing   Problem: Respiratory: Goal: Ability to maintain adequate ventilation will improve Outcome: Progressing   Problem: Education: Goal: Ability to describe self-care measures that may prevent or decrease complications (Diabetes Survival Skills Education) will improve Outcome: Progressing Goal: Individualized Educational Video(s) Outcome: Progressing   Problem:  Coping: Goal: Ability to adjust to condition or change in health will improve Outcome: Progressing   Problem: Fluid Volume: Goal: Ability to maintain a balanced intake and output will improve Outcome: Progressing   Problem: Health Behavior/Discharge Planning: Goal: Ability to identify and utilize available resources and services will improve Outcome: Progressing Goal: Ability to manage health-related needs will improve Outcome: Progressing   Problem: Metabolic: Goal: Ability to maintain appropriate glucose levels will improve Outcome: Progressing   Problem: Nutritional: Goal: Maintenance of adequate nutrition will improve Outcome: Progressing Goal: Progress toward achieving an optimal weight will improve Outcome: Progressing   Problem: Skin Integrity: Goal: Risk for impaired skin integrity will decrease Outcome: Progressing   Problem: Tissue Perfusion: Goal: Adequacy of tissue perfusion will improve Outcome: Progressing   Problem: Education: Goal: Knowledge of General Education information will improve Description: Including pain rating scale, medication(s)/side effects and non-pharmacologic comfort measures Outcome: Progressing   Problem: Health Behavior/Discharge Planning: Goal: Ability to manage health-related needs will improve Outcome: Progressing   Problem: Clinical Measurements: Goal: Ability to maintain clinical measurements within normal limits will improve Outcome: Progressing Goal: Will remain free from infection Outcome: Progressing Goal: Diagnostic test results will improve Outcome: Progressing Goal: Respiratory complications will improve Outcome: Progressing Goal: Cardiovascular complication will be avoided Outcome: Progressing   Problem: Activity: Goal: Risk for activity intolerance will decrease Outcome: Progressing   Problem: Nutrition: Goal: Adequate nutrition will be maintained Outcome: Progressing   Problem: Coping: Goal: Level of  anxiety will decrease Outcome: Progressing   Problem: Elimination: Goal: Will not experience complications related to bowel motility Outcome: Progressing Goal: Will not experience complications related to urinary retention Outcome: Progressing   Problem: Pain Managment: Goal: General experience of comfort will improve Outcome: Progressing   Problem: Safety: Goal: Ability to remain free from injury will improve Outcome: Progressing   Problem: Skin Integrity: Goal: Risk for impaired skin integrity will decrease Outcome: Progressing   

## 2023-04-06 NOTE — Evaluation (Signed)
Physical Therapy Evaluation Patient Details Name: Barbara Butler MRN: [id number] DOB: Sep 06, 1929 Today's Date: 04/06/2023  History of Present Illness  Pt is a 89 y/o F admitted on 04/05/23 after presenting with c/o blood in stool. Pt is being treated for sepsis 2/2 UTI. PMH: R hip proximal femoral fx, proximal R humeral neck fx, CVA, a-fib on eliquis, HLD, NIDDM, hypothyroid, HTN  Clinical Impression  Pt seen for PT evaluation with pt agreeable. Pt reports she's been in short term rehab since falling & injuring RUE & RLE, has been using a w/c but initiating training with RW. PT educates pt on RUE precautions with pt requiring cuing for technique during STS with RW. Pt is able to transfer with min assist & ambulate a few steps in room with RW & min/mod assist. Pt with elevated HR with minimal activity but pt denies c/o adverse symptoms. Recommend ongoing PT services to maximize independence with mobility & reduce fall risk while in acute setting.        If plan is discharge home, recommend the following: A little help with walking and/or transfers;A little help with bathing/dressing/bathroom;Assistance with cooking/housework;Assist for transportation;Help with stairs or ramp for entrance   Can travel by private vehicle   Yes    Equipment Recommendations Rolling walker (2 wheels);BSC/3in1  Recommendations for Other Services       Functional Status Assessment Patient has had a recent decline in their functional status and demonstrates the ability to make significant improvements in function in a reasonable and predictable amount of time.     Precautions / Restrictions Precautions Precautions: Fall Restrictions Weight Bearing Restrictions: Yes RUE Weight Bearing:  (can use RUE for ambulation with RW, no pushing/pulling through RUE, unrestricted ROM) RLE Weight Bearing: Weight bearing as tolerated      Mobility  Bed Mobility Overal bed mobility: Needs Assistance Bed Mobility: Supine  to Sit     Supine to sit: Supervision, HOB elevated, Used rails          Transfers Overall transfer level: Needs assistance Equipment used: Rolling walker (2 wheels) Transfers: Sit to/from Stand, Bed to chair/wheelchair/BSC Sit to Stand: Contact guard assist   Step pivot transfers: Min assist (cuing for increased glute/hip extensor activation to reduce posterior lean)       General transfer comment: education re: RUE on RW, push to stand with LUE    Ambulation/Gait Ambulation/Gait assistance: Min assist, Mod assist Gait Distance (Feet): 6 Feet (3 ft forwards + 3 ft backwards with RW) Assistive device: Rolling walker (2 wheels) Gait Pattern/deviations: Decreased dorsiflexion - right, Decreased step length - left, Decreased step length - right, Decreased stride length, Decreased dorsiflexion - left Gait velocity: decreased     General Gait Details: Pt able to take forward steps with RW & min assist, requires mod assist to take steps backwards to recliner 2/2 posterior lean/LOB. Pt reports she feels she could walk more once she "loosens up".  Stairs            Wheelchair Mobility     Tilt Bed    Modified Rankin (Stroke Patients Only)       Balance Overall balance assessment: Needs assistance Sitting-balance support: Feet supported, No upper extremity supported Sitting balance-Leahy Scale: Fair     Standing balance support: Bilateral upper extremity supported, Reliant on assistive device for balance, During functional activity Standing balance-Leahy Scale: Poor  Pertinent Vitals/Pain Pain Assessment Pain Assessment: No/denies pain    Home Living Family/patient expects to be discharged to:: Skilled nursing facility                   Additional Comments: Prior to fall causing fxs pt was living at home. Pt has been at [facility] for short term rehab since then.    Prior Function                Mobility Comments: While in rehab, pt using w/c & starting to work with using RW.       Extremity/Trunk Assessment   Upper Extremity Assessment Upper Extremity Assessment: Generalized weakness    Lower Extremity Assessment Lower Extremity Assessment: Generalized weakness;RLE deficits/detail RLE Deficits / Details: 2+/5 knee extension in sitting    Cervical / Trunk Assessment Cervical / Trunk Assessment: Kyphotic  Communication   Communication Communication: Hearing impairment (slightly HOH)  Cognition Arousal: Alert Behavior During Therapy: WFL for tasks assessed/performed Overall Cognitive Status: Within Functional Limits for tasks assessed                                          General Comments General comments (skin integrity, edema, etc.): Max HR with standing/stepping 137 bpm - nurse made aware, pt denied symptoms.    Exercises     Assessment/Plan    PT Assessment Patient needs continued PT services  PT Problem List Decreased strength;Decreased activity tolerance;Decreased balance;Decreased mobility;Decreased knowledge of use of DME;Decreased safety awareness;Decreased knowledge of precautions;Decreased range of motion       PT Treatment Interventions Balance training;DME instruction;Modalities;Gait training;Neuromuscular re-education;Stair training;Functional mobility training;Patient/family education;Therapeutic activities;Therapeutic exercise;Manual techniques    PT Goals (Current goals can be found in the Care Plan section)  Acute Rehab PT Goals Patient Stated Goal: keep making progress PT Goal Formulation: With patient Time For Goal Achievement: 04/20/23 Potential to Achieve Goals: Good    Frequency Min 1X/week     Co-evaluation               AM-PAC PT "6 Clicks" Mobility  Outcome Measure Help needed turning from your back to your side while in a flat bed without using bedrails?: None Help needed moving from lying on your  back to sitting on the side of a flat bed without using bedrails?: A Little Help needed moving to and from a bed to a chair (including a wheelchair)?: A Little Help needed standing up from a chair using your arms (e.g., wheelchair or bedside chair)?: A Little Help needed to walk in hospital room?: A Lot Help needed climbing 3-5 steps with a railing? : A Lot 6 Click Score: 17    End of Session   Activity Tolerance: Patient tolerated treatment well;Treatment limited secondary to medical complications (Comment) (elevated HR) Patient left: in chair;with chair alarm set;with call bell/phone within reach Nurse Communication: Mobility status (recommendation to use BSC for toileting, HR) PT Visit Diagnosis: Unsteadiness on feet (R26.81);Muscle weakness (generalized) (M62.81);Difficulty in walking, not elsewhere classified (R26.2);Other abnormalities of gait and mobility (R26.89)    Time: 1610-9604 PT Time Calculation (min) (ACUTE ONLY): 12 min   Charges:   PT Evaluation $PT Eval Low Complexity: 1 Low   PT General Charges $$ ACUTE PT VISIT: 1 Visit         Aleda Grana, PT, DPT 04/06/23, 9:01 AM   Sandi Mariscal  04/06/2023, 9:00 AM

## 2023-04-06 NOTE — Evaluation (Signed)
Occupational Therapy Evaluation Patient Details Name: Barbara Butler MRN: [id number] DOB: [birth year] Today's Date: 04/06/2023   History of Present Illness Pt is a 89 y/o F admitted on 04/05/23 after presenting with c/o blood in stool. Pt is being treated for sepsis 2/2 UTI. PMH: R hip proximal femoral fx, proximal R humeral neck fx, CVA, a-fib on eliquis, HLD, NIDDM, hypothyroid, HTN   Clinical Impression   Ms Wunderlich was seen for OT evaluation this date. Prior to hospital admission, pt was recently at [facility], prior to which pt was home alone with PRN assist. Pt currently requires MIN A + RW for toilet t/f. MIN cues for RUE placement to avoid push/pull on RUE and assist to navigate RW around items. Requires BUE support static standing. Pt would benefit from skilled OT to address noted impairments and functional limitations (see below for any additional details). Upon hospital discharge, recommend OT follow up.     If plan is discharge home, recommend the following: A little help with walking and/or transfers;A little help with bathing/dressing/bathroom;Help with stairs or ramp for entrance    Functional Status Assessment  Patient has had a recent decline in their functional status and demonstrates the ability to make significant improvements in function in a reasonable and predictable amount of time.  Equipment Recommendations  BSC/3in1    Recommendations for Other Services       Precautions / Restrictions Precautions Precautions: Fall Restrictions Weight Bearing Restrictions: Yes RUE Weight Bearing:  (can use RUE for ambulation with RW, no pushing/pulling through RUE, unrestricted ROM) RLE Weight Bearing: Weight bearing as tolerated      Mobility Bed Mobility               General bed mobility comments: not tested    Transfers Overall transfer level: Needs assistance Equipment used: Rolling walker (2 wheels) Transfers: Sit to/from Stand Sit to Stand: Min assist            General transfer comment: cues for RUE placement to avoid push/pull on RUE      Balance Overall balance assessment: Needs assistance Sitting-balance support: Feet supported, No upper extremity supported Sitting balance-Leahy Scale: Good     Standing balance support: Bilateral upper extremity supported Standing balance-Leahy Scale: Fair                             ADL either performed or assessed with clinical judgement   ADL Overall ADL's : Needs assistance/impaired                                       General ADL Comments: MIN A + RW for toilet t/f. MOD A for LB access in sitting.      Pertinent Vitals/Pain Pain Assessment Pain Assessment: No/denies pain     Extremity/Trunk Assessment Upper Extremity Assessment Upper Extremity Assessment: RUE deficits/detail;Generalized weakness RUE Deficits / Details: R shoulder flexion AROM ~90*   Lower Extremity Assessment Lower Extremity Assessment: Generalized weakness RLE Deficits / Details: 2+/5 knee extension in sitting   Cervical / Trunk Assessment Cervical / Trunk Assessment: Kyphotic   Communication Communication Communication: Hearing impairment   Cognition Arousal: Alert Behavior During Therapy: WFL for tasks assessed/performed Overall Cognitive Status: Within Functional Limits for tasks assessed  Home Living Family/patient expects to be discharged to:: Private residence Living Arrangements: Alone Available Help at Discharge: Family;Available PRN/intermittently Type of Home: House Home Access: Stairs to enter Entrance Stairs-Number of Steps: 4   Home Layout: One level                   Additional Comments: Home setup per chart. Pt recently at [facility]      Prior Functioning/Environment Prior Level of Function : Independent/Modified Independent;Driving             Mobility Comments: While in  rehab, pt using w/c & starting to work with using RW. ADLs Comments: Since CVA, has been receiving assist with bathing and dressing. prior, was driving and grocery shopping, indep with ADL        OT Problem List: Decreased range of motion;Decreased strength;Decreased activity tolerance;Impaired balance (sitting and/or standing)      OT Treatment/Interventions: Self-care/ADL training;Therapeutic exercise;Energy conservation;DME and/or AE instruction;Therapeutic activities;Balance training;Patient/family education    OT Goals(Current goals can be found in the care plan section) Acute Rehab OT Goals Patient Stated Goal: to go to rehab then home OT Goal Formulation: With patient Time For Goal Achievement: 04/20/23 Potential to Achieve Goals: Good ADL Goals Pt Will Perform Grooming: with modified independence;standing Pt Will Perform Lower Body Dressing: with modified independence;sit to/from stand Pt Will Transfer to Toilet: with modified independence;ambulating;regular height toilet Pt Will Perform Toileting - Clothing Manipulation and hygiene: with modified independence;sitting/lateral leans  OT Frequency: Min 1X/week    Co-evaluation              AM-PAC OT "6 Clicks" Daily Activity     Outcome Measure Help from another person eating meals?: None Help from another person taking care of personal grooming?: A Little Help from another person toileting, which includes using toliet, bedpan, or urinal?: A Lot Help from another person bathing (including washing, rinsing, drying)?: A Lot Help from another person to put on and taking off regular upper body clothing?: A Little Help from another person to put on and taking off regular lower body clothing?: A Lot 6 Click Score: 16   End of Session    Activity Tolerance: Patient tolerated treatment well Patient left: in chair;with call bell/phone within reach;with chair alarm set  OT Visit Diagnosis: Unsteadiness on feet (R26.81);Other  abnormalities of gait and mobility (R26.89)                Time: 9604-5409 OT Time Calculation (min): 10 min Charges:  OT General Charges $OT Visit: 1 Visit OT Evaluation $OT Eval Low Complexity: 1 Low  Kathie Dike, M.S. OTR/L  04/06/23, 9:57 AM  ascom [phone]

## 2023-04-07 DIAGNOSIS — N390 Urinary tract infection, site not specified: Secondary | ICD-10-CM | POA: Diagnosis not present

## 2023-04-07 DIAGNOSIS — A419 Sepsis, unspecified organism: Secondary | ICD-10-CM | POA: Diagnosis not present

## 2023-04-07 LAB — BASIC METABOLIC PANEL
Anion gap: 10 (ref 5–15)
BUN: 19 mg/dL (ref 8–23)
CO2: 24 mmol/L (ref 22–32)
Calcium: 9 mg/dL (ref 8.9–10.3)
Chloride: 103 mmol/L (ref 98–111)
Creatinine, Ser: 0.84 mg/dL (ref 0.44–1.00)
GFR, Estimated: >60 mL/min (ref >60)
Glucose, Bld: 165 mg/dL — ABNORMAL HIGH (ref 70–99)
Potassium: 3.6 mmol/L (ref 3.5–5.1)
Sodium: 137 mmol/L (ref 135–145)

## 2023-04-07 LAB — CBC
HCT: 37.6 % (ref 36.0–46.0)
Hemoglobin: 12.5 g/dL (ref 12.0–15.0)
MCH: 29.6 pg (ref 26.0–34.0)
MCHC: 33.2 g/dL (ref 30.0–36.0)
MCV: 88.9 fL (ref 80.0–100.0)
Platelets: 269 10*3/uL (ref 150–400)
RBC: 4.23 MIL/uL (ref 3.87–5.11)
RDW: 15.1 % (ref 11.5–15.5)
WBC: 5 10*3/uL (ref 4.0–10.5)
nRBC: 0 % (ref 0.0–0.2)

## 2023-04-07 LAB — GLUCOSE, CAPILLARY
Glucose-Capillary: 191 mg/dL — ABNORMAL HIGH (ref 70–99)
Glucose-Capillary: 172 mg/dL — ABNORMAL HIGH (ref 70–99)
Glucose-Capillary: 207 mg/dL — ABNORMAL HIGH (ref 70–99)
Glucose-Capillary: 197 mg/dL — ABNORMAL HIGH (ref 70–99)

## 2023-04-07 LAB — URINE CULTURE: Culture: >=100000 — AB

## 2023-04-07 LAB — MAGNESIUM: Magnesium: 1.8 mg/dL (ref 1.7–2.4)

## 2023-04-07 MED ORDER — POTASSIUM CHLORIDE CRYS ER 20 MEQ PO TBCR
40.0000 meq | EXTENDED_RELEASE_TABLET | Freq: Once | ORAL | Status: AC
  Administered 2023-04-07: 40 meq via ORAL
  Filled 2023-04-07: qty 2

## 2023-04-07 MED ORDER — APIXABAN 2.5 MG PO TABS
2.5000 mg | ORAL_TABLET | Freq: Two times a day (BID) | ORAL | Status: DC
  Administered 2023-04-07 – 2023-04-08 (×3): 2.5 mg via ORAL
  Filled 2023-04-07 (×3): qty 1

## 2023-04-07 MED ORDER — MAGNESIUM OXIDE -MG SUPPLEMENT 400 (240 MG) MG PO TABS
800.0000 mg | ORAL_TABLET | Freq: Once | ORAL | Status: AC
  Administered 2023-04-07: 800 mg via ORAL
  Filled 2023-04-07: qty 2

## 2023-04-07 MED ORDER — CEPHALEXIN 500 MG PO CAPS
500.0000 mg | ORAL_CAPSULE | Freq: Two times a day (BID) | ORAL | Status: DC
  Administered 2023-04-08: 500 mg via ORAL
  Filled 2023-04-07: qty 1

## 2023-04-07 NOTE — Progress Notes (Signed)
Physical Therapy Treatment Patient Details Name: Barbara Butler MRN: [id number] DOB: 1930-02-16 Today's Date: 04/07/2023   History of Present Illness Pt is a 89 y/o F admitted on 04/05/23 after presenting with c/o blood in stool. Pt is being treated for sepsis 2/2 UTI. PMH: R hip proximal femoral fx, proximal R humeral neck fx, CVA, a-fib on eliquis, HLD, NIDDM, hypothyroid, HTN    PT Comments  Pt was pleasant and motivated to participate during the session and put forth good effort throughout. Pt remains supervision for bed mobility with cues to prevent RUE use.  Pt able to perform STS's from varying heights and surfaces throughout session with Min A and cues to prevent RUE use. Once up from EOB, pt able to take a few steps towards recliner. Pt able to perform additional ambulatory bouts of 8 feet x3 with RW and CGA,with a chair follow in room. Pt overall steady but taking slowed steps. Needing additional cues for AD management when taking a backwards steps to sit in chair. No imbalance or SOB seen. HR peaked at 101 bpm during activity. Pt will benefit from continued PT services upon discharge to safely address deficits listed in patient problem list for decreased caregiver assistance and eventual return to PLOF.    If plan is discharge home, recommend the following: A little help with walking and/or transfers;A little help with bathing/dressing/bathroom;Assistance with cooking/housework;Assist for transportation;Help with stairs or ramp for entrance   Can travel by private vehicle     Yes  Equipment Recommendations  Rolling walker (2 wheels);BSC/3in1    Recommendations for Other Services       Precautions / Restrictions Precautions Precautions: Fall Restrictions Weight Bearing Restrictions: Yes RUE Weight Bearing:  (can use RUE for ambulation with RW, no pushing/pulling through RUE, unrestricted ROM) RLE Weight Bearing: Weight bearing as tolerated     Mobility  Bed Mobility Overal  bed mobility: Needs Assistance Bed Mobility: Supine to Sit     Supine to sit: Supervision, HOB elevated, Used rails     General bed mobility comments: Cues provided to ensure RUE was not used to support herself during bed mobility    Transfers Overall transfer level: Needs assistance Equipment used: Rolling walker (2 wheels) Transfers: Sit to/from Stand Sit to Stand: Min assist           General transfer comment: cues for RUE placement to avoid push/pull on RUE, Pt performed multiple STS's throughout session, maintaining cues for RUE well.    Ambulation/Gait Ambulation/Gait assistance: Contact guard assist Gait Distance (Feet): 3 Feet x1. 8 feet x3 Assistive device: Rolling walker (2 wheels) Gait Pattern/deviations: Decreased dorsiflexion - right, Decreased step length - left, Decreased step length - right, Decreased stride length, Decreased dorsiflexion - left Gait velocity: decreased     General Gait Details: Pt able to take a few steps to relciner to sit. pt able to walk short ambulatory stretches in room with chair follow for safety, taking short seated break after each. Pt displaying no posterior lean this session, taking steady steps with RW. Additional cues needed when backwards stepping for sequencing and AD management   Stairs             Wheelchair Mobility     Tilt Bed    Modified Rankin (Stroke Patients Only)       Balance Overall balance assessment: Needs assistance Sitting-balance support: Feet supported, No upper extremity supported Sitting balance-Leahy Scale: Good     Standing balance support: Bilateral upper  extremity supported Standing balance-Leahy Scale: Fair Standing balance comment: static standing with RW                            Cognition Arousal: Alert Behavior During Therapy: WFL for tasks assessed/performed Overall Cognitive Status: History of cognitive impairments - at baseline                                           Exercises      General Comments General comments (skin integrity, edema, etc.): Max HR seen 101 bpm today during activity      Pertinent Vitals/Pain Pain Assessment Pain Assessment: No/denies pain    Home Living                          Prior Function            PT Goals (current goals can now be found in the care plan section) Progress towards PT goals: Progressing toward goals    Frequency    Min 1X/week      PT Plan      Co-evaluation              AM-PAC PT "6 Clicks" Mobility   Outcome Measure  Help needed turning from your back to your side while in a flat bed without using bedrails?: None Help needed moving from lying on your back to sitting on the side of a flat bed without using bedrails?: A Little Help needed moving to and from a bed to a chair (including a wheelchair)?: A Little Help needed standing up from a chair using your arms (e.g., wheelchair or bedside chair)?: A Little Help needed to walk in hospital room?: A Little Help needed climbing 3-5 steps with a railing? : A Lot 6 Click Score: 18    End of Session Equipment Utilized During Treatment: Gait belt Activity Tolerance: Patient tolerated treatment well Patient left: in chair;with chair alarm set;with call bell/phone within reach Nurse Communication: Mobility status (need for purewick replacement) PT Visit Diagnosis: Unsteadiness on feet (R26.81);Muscle weakness (generalized) (M62.81);Difficulty in walking, not elsewhere classified (R26.2);Other abnormalities of gait and mobility (R26.89)     Time: 1610-9604 PT Time Calculation (min) (ACUTE ONLY): 29 min  Charges:                            Cecile Sheerer, SPT 04/07/23, 4:18 PM

## 2023-04-07 NOTE — Progress Notes (Signed)
vv    Progress Note    Barbara Butler  GNF:[id number] DOB: 1929-09-29  DOA: 04/05/2023 PCP: [name], MD      Brief Narrative:    Medical records reviewed and are as summarized below:  Barbara Butler is a 89 y.o. female with history of close right hip proximal femoral fracture, history of proximal right humeral neck, history of CVA and atrial fibrillation on Eliquis, hyperlipidemia, non-insulin-dependent diabetes mellitus, hypothyroid, hypertension, who was brought to the hospital because of concern for rectal bleeding.  Patient said that she was informed by the nursing staff at the nursing facility that she had bloody stools but she did not see a stools herself.  Per Dr. Jefferey Pica (ED physician) note patient had an episode of diarrhea in the emergency department but there was no gross blood or melena.     Assessment/Plan:   Principal Problem:   Sepsis secondary to UTI [facility]) Active Problems:   Type 2 diabetes mellitus with hyperglycemia, without long-term current use of insulin (HCC)   Hypertension   Hypothyroidism   Cerebrovascular accident (CVA) (HCC)   Atrial fibrillation/flutter (HCC)   Humeral surgical neck fracture   Diverticulosis of colon   Cholelithiasis   Leg swelling   Body mass index is 22.64 kg/m.   Severe sepsis secondary to acute E. coli UTI: Urine culture showed E. coli. Discontinue IV ceftriaxone and start Keflex tomorrow. No growth on blood cultures thus far.   Atrial fibrillation with RVR: Continue metoprolol.  Restart Eliquis.   Patient understands that she still at increased risk of bleeding although she is not actively bleeding at the moment.  She was to continue Eliquis for now.  Her daughter-in-law, Barbara Butler, agrees with this plan.   Mild rectal bleeding is likely from hemorrhoids: Patient found to have external hemorrhoids on rectal exam.  She confirmed that she has a history of hemorrhoids but has not had any significant rectal  bleeding from this. H&H is stable.   Hypokalemia: Improved.  Continue potassium repletion.  Magnesium is 1.8.  Start oral magnesium oxide.   Recent IM nailing of right femur on 02/06/2023, recent fracture of surgical neck of the right humerus   Other comorbidities include hypertension, hypothyroidism, colonic diverticulosis, cholelithiasis   Diet Order             Diet heart healthy/carb modified Fluid consistency: Thin  Diet effective now                            Consultants: None  Procedures: None    Medications:    amLODipine  5 mg Oral Daily   vitamin C  1,000 mg Oral Q1200   cholecalciferol  1,000 Units Oral Q1200   feeding supplement  237 mL Oral Q24H   hydrALAZINE  50 mg Oral BID   influenza vaccine adjuvanted  0.5 mL Intramuscular Tomorrow-1000   insulin aspart  0-5 Units Subcutaneous QHS   insulin aspart  0-9 Units Subcutaneous TID WC   levothyroxine  88 mcg Oral QAC breakfast   metoprolol succinate  50 mg Oral Daily   pneumococcal 20-valent conjugate vaccine  0.5 mL Intramuscular Tomorrow-1000   Continuous Infusions:  cefTRIAXone (ROCEPHIN)  IV 2 g (04/07/23 0917)     Anti-infectives (From admission, onward)    Start     Dose/Rate Route Frequency Ordered Stop   04/05/23 1015  cefTRIAXone (ROCEPHIN) 2 g in sodium chloride 0.9 % 100 mL IVPB  2 g 200 mL/hr over 30 Minutes Intravenous Every 24 hours 04/05/23 1007 04/12/23 1014              Family Communication/Anticipated D/C date and plan/Code Status   DVT prophylaxis: Place TED hose Start: 04/05/23 1220     Code Status: Full Code  Family Communication: Plan of care was discussed with Barbara Butler, daughter-in-law, at the bedside Disposition Plan: Plan to discharge to SNF   Status is: Inpatient Remains inpatient appropriate because: UTI       Subjective:   Interval events noted.  She has no complaints.  No dizziness, abdominal pain, diarrhea, rectal bleeding,  vomiting.  She feels better today.  Objective:    Vitals:   04/06/23 1754 04/06/23 1924 04/07/23 0306 04/07/23 0724  BP: (!) 140/83 (!) 145/86 (!) 150/95 (!) 159/88  Pulse: 90 84 85 90  Resp:  18 20 18   Temp: 97.9 F (36.6 C) 98.5 F (36.9 C) 98.3 F (36.8 C) 98.3 F (36.8 C)  TempSrc:    Oral  SpO2: 99% 100% 100% 99%  Weight:      Height:       No data found.   Intake/Output Summary (Last 24 hours) at 04/07/2023 0940 Last data filed at 04/07/2023 0700 Gross per 24 hour  Intake 960 ml  Output 1500 ml  Net -540 ml   Filed Weights   04/05/23 0906  Weight: 61.7 kg    Exam:   GEN: NAD SKIN: Warm and dry EYES: No pallor or icterus ENT: MMM CV: Irregular rate and rhythm PULM: CTA B ABD: soft, ND, NT, +BS CNS: AAO x 3, non focal EXT: No edema or tenderness RECTAL: External hemorrhoids on inspection. No blood on examining finger   Velvet, RN, charge nurse at bedside as chaperon    Data Reviewed:   I have personally reviewed following labs and imaging studies:  Labs: Labs show the following:   Basic Metabolic Panel: Recent Labs  Lab 04/05/23 0912 04/06/23 0356 04/07/23 0316  NA 138 139 137  K 4.0 3.3* 3.6  CL 103 105 103  CO2 23 25 24   GLUCOSE 172* 147* 165*  BUN 20 14 19   CREATININE 0.71 0.57 0.84  CALCIUM 9.3 8.8* 9.0  MG  --  1.7  --    GFR Estimated Creatinine Clearance: 39.3 mL/min (by C-G formula based on SCr of 0.84 mg/dL). Liver Function Tests: Recent Labs  Lab 04/05/23 0912  AST 23  ALT 12  ALKPHOS 102  BILITOT 0.8  PROT 7.9  ALBUMIN 4.0   Recent Labs  Lab 04/05/23 0912  LIPASE 28   No results for input(s): "AMMONIA" in the last 168 hours. Coagulation profile Recent Labs  Lab 04/05/23 1249  INR 1.4*    CBC: Recent Labs  Lab 04/05/23 0912 04/06/23 0356 04/06/23 1356 04/07/23 0316  WBC 5.4 4.7 6.0 5.0  NEUTROABS 3.1  --   --   --   HGB 14.8 12.3 13.2 12.5  HCT 45.2 35.8* 40.2 37.6  MCV 90.4 86.9 89.5 88.9   PLT 315 256 288 269   Cardiac Enzymes: No results for input(s): "CKTOTAL", "CKMB", "CKMBINDEX", "TROPONINI" in the last 168 hours. BNP (last 3 results) No results for input(s): "PROBNP" in the last 8760 hours. CBG: Recent Labs  Lab 04/06/23 0739 04/06/23 1144 04/06/23 1639 04/06/23 2127 04/07/23 0723  GLUCAP 158* 195* 126* 135* 191*   D-Dimer: No results for input(s): "DDIMER" in the last 72 hours. Hgb A1c: No  results for input(s): "HGBA1C" in the last 72 hours. Lipid Profile: No results for input(s): "CHOL", "HDL", "LDLCALC", "TRIG", "CHOLHDL", "LDLDIRECT" in the last 72 hours. Thyroid function studies: No results for input(s): "TSH", "T4TOTAL", "T3FREE", "THYROIDAB" in the last 72 hours.  Invalid input(s): "FREET3" Anemia work up: No results for input(s): "VITAMINB12", "FOLATE", "FERRITIN", "TIBC", "IRON", "RETICCTPCT" in the last 72 hours. Sepsis Labs: Recent Labs  Lab 04/05/23 0912 04/05/23 1249 04/05/23 1555 04/06/23 0356 04/06/23 1356 04/07/23 0316  PROCALCITON  --   --   --  0.12  --   --   WBC 5.4  --   --  4.7 6.0 5.0  LATICACIDVEN 2.7* 3.9* 2.8*  --  2.3*  --     Microbiology Recent Results (from the past 240 hour(s))  Urine Culture     Status: Abnormal   Collection Time: 04/05/23  9:38 AM   Specimen: Urine, Random  Result Value Ref Range Status   Specimen Description   Final    URINE, RANDOM Performed at [facility], [address] [place], [place] [address]    Special Requests   Final    NONE Reflexed from [id number] Performed at [facility], [address] [place], [place] [address]    Culture >=100,000 COLONIES/mL ESCHERICHIA COLI (A)  Final   Report Status 04/07/2023 FINAL  Final   Organism ID, Bacteria ESCHERICHIA COLI (A)  Final      Susceptibility   Escherichia coli - MIC*    AMPICILLIN >=32 RESISTANT Resistant     CEFAZOLIN <=4 SENSITIVE Sensitive     CEFEPIME <=0.12 SENSITIVE Sensitive     CEFTRIAXONE <=0.25  SENSITIVE Sensitive     CIPROFLOXACIN 0.5 INTERMEDIATE Intermediate     GENTAMICIN <=1 SENSITIVE Sensitive     IMIPENEM <=0.25 SENSITIVE Sensitive     NITROFURANTOIN <=16 SENSITIVE Sensitive     TRIMETH/SULFA <=20 SENSITIVE Sensitive     AMPICILLIN/SULBACTAM >=32 RESISTANT Resistant     PIP/TAZO <=4 SENSITIVE Sensitive     * >=100,000 COLONIES/mL ESCHERICHIA COLI  Blood culture (routine x 2)     Status: None (Preliminary result)   Collection Time: 04/05/23 12:51 PM   Specimen: BLOOD  Result Value Ref Range Status   Specimen Description BLOOD BLOOD LEFT HAND  Final   Special Requests   Final    BOTTLES DRAWN AEROBIC AND ANAEROBIC Blood Culture adequate volume   Culture   Final    NO GROWTH 2 DAYS Performed at [facility], [address], [place], [place] [address]    Report Status PENDING  Incomplete  Blood culture (routine x 2)     Status: None (Preliminary result)   Collection Time: 04/05/23  3:55 PM   Specimen: BLOOD  Result Value Ref Range Status   Specimen Description BLOOD BLOOD RIGHT HAND  Final   Special Requests   Final    BOTTLES DRAWN AEROBIC ONLY Blood Culture adequate volume   Culture   Final    NO GROWTH 2 DAYS Performed at [facility], [address], [place], [place] [address]    Report Status PENDING  Incomplete    Procedures and diagnostic studies:  CT ANGIO GI BLEED  Result Date: 04/05/2023 CLINICAL DATA:  Lower GI bleed, bloody stools since this morning, recent right hip fracture EXAM: CTA ABDOMEN AND PELVIS WITHOUT AND WITH CONTRAST TECHNIQUE: Multidetector CT imaging of the abdomen and pelvis was performed using the standard protocol during bolus administration of intravenous contrast. Multiplanar  reconstructed images and MIPs were obtained and reviewed to evaluate the vascular anatomy. RADIATION DOSE REDUCTION: This exam was performed according to the departmental dose-optimization program which includes automated exposure control,  adjustment of the mA and/or kV according to patient size and/or use of iterative reconstruction technique. CONTRAST:  75mL OMNIPAQUE IOHEXOL 350 MG/ML SOLN COMPARISON:  CT chest abdomen pelvis, 02/05/2023 FINDINGS: VASCULAR Normal contour and caliber of the abdominal aorta. No evidence of aneurysm, dissection, or other acute aortic pathology. Standard branching pattern of the abdominal aorta with solitary bilateral renal arteries. Moderate mixed calcific atherosclerosis. Review of the MIP images confirms the above findings. NON-VASCULAR Lower Chest: No acute findings. Mild bibasilar fibrosis. Cardiomegaly and coronary artery calcifications. Hepatobiliary: No solid liver abnormality is seen. Simple benign left lobe liver cysts, for which no further follow-up or characterization is required. Gallstones. No gallbladder wall thickening. Similar common bile duct dilatation measuring up to 1.0 cm in caliber. No calculus or other obstruction identified to the ampulla. Pancreas: Unchanged appearance of the pancreas with diffuse dilatation of the pancreatic duct to the ampulla, measuring up to 1.0 cm in caliber, with mild, diffuse pancreatic parenchymal atrophy. No solid lesion or suspicious contrast enhancement. This appearance is unchanged in comparison to examinations dating back to at least 2013 and is benign, requiring no further follow-up or characterization. Spleen: Normal in size without significant abnormality. Adrenals/Urinary Tract: Adrenal glands are unremarkable. Simple, benign bilateral renal cortical cysts, for which no further follow-up or characterization is required. Kidneys are otherwise normal, without renal calculi, solid lesion, or hydronephrosis. Bladder is unremarkable. Stomach/Bowel: Stomach is within normal limits. Appendix appears normal. No evidence of bowel wall thickening, distention, or inflammatory changes. Pancolonic diverticulosis. No evidence of intraluminal contrast extravasation or other  findings to specifically localize GI bleeding. Lymphatic: No enlarged abdominal or pelvic lymph nodes. Reproductive: Calcified fibroids. Other: Fat containing right inguinal hernia. Anasarca. No ascites. Musculoskeletal: No acute osseous findings. Status post right hip intramedullary nail fixation with subacute intratrochanteric fractures. Unchanged superior endplate wedge deformities of L1, L2, and L3. IMPRESSION: 1. No evidence of intraluminal contrast extravasation or other findings to specifically localize GI bleeding. 2. Pancolonic diverticulosis without evidence of acute diverticulitis. 3. Cholelithiasis without evidence of acute cholecystitis. Similar common bile duct dilatation measuring up to 1.0 cm in caliber. No calculus or other obstruction identified to the ampulla. 4. Status post right hip intramedullary nail fixation with subacute intratrochanteric fractures. 5. Unchanged superior endplate wedge deformities of L1, L2, and L3. Aortic Atherosclerosis (ICD10-I70.0). Electronically Signed   By: Jearld Lesch M.D.   On: 04/05/2023 12:40               LOS: 2 days   Serayah Yazdani  [facility]   Pager on [email or web address]. If 7PM-7AM, please contact night-coverage at [email or web address]     04/07/2023, 9:40 AM

## 2023-04-07 NOTE — TOC Initial Note (Signed)
Transition of Care [facility]) - Initial/Assessment Note    Patient Details  Name: Barbara Butler MRN: [id number] Date of Birth: 1929-07-28  Transition of Care [facility]) CM/SW Contact:    Chapman Fitch, RN Phone Number: 04/07/2023, 11:43 AM  Clinical Narrative:                   Admitted for: sepsis Admitted from: [facility].  Confirmed with [place] at [place] PCP: Hande   Met with patient and daughter in law Pecolia at bedside.  They confirm plan is to return to [facility] to continue STR at discharge  Maralyn Sago with TOC to complete FL2 and start insurance auth.  Per MD anticipated patient will medically be ready for dc tomorrow    Expected Discharge Plan: Skilled Nursing Facility     Patient Goals and CMS Choice            Expected Discharge Plan and Services                                              Prior Living Arrangements/Services                       Activities of Daily Living   ADL Screening (condition at time of admission) Does the patient have a NEW difficulty with bathing/dressing/toileting/self-feeding that is expected to last >3 days?: Yes (Initiates electronic notice to provider for possible OT consult) Does the patient have a NEW difficulty with getting in/out of bed, walking, or climbing stairs that is expected to last >3 days?: Yes (Initiates electronic notice to provider for possible PT consult) Does the patient have a NEW difficulty with communication that is expected to last >3 days?: Yes (Initiates electronic notice to provider for possible SLP consult) Is the patient deaf or have difficulty hearing?: Yes Does the patient have difficulty seeing, even when wearing glasses/contacts?: No Does the patient have difficulty concentrating, remembering, or making decisions?: No  Permission Sought/Granted                  Emotional Assessment              Admission diagnosis:  Rectal bleeding [K62.5] Acute cystitis without  hematuria [N30.00] Sepsis secondary to UTI (HCC) [A41.9, N39.0] Patient Active Problem List   Diagnosis Date Noted   Sepsis secondary to UTI (HCC) 04/05/2023   Diverticulosis of colon 04/05/2023   Cholelithiasis 04/05/2023   Leg swelling 04/05/2023   Fall 02/06/2023   Fall at nursing home, initial encounter 02/05/2023   Closed right hip fracture, initial encounter (HCC) 02/05/2023   Humeral surgical neck fracture 02/05/2023   DNR (do not resuscitate) 02/05/2023   Atrial flutter (HCC) 01/28/2023   Atrial fibrillation/flutter (HCC) 01/28/2023   Cerebrovascular accident (CVA) (HCC) 01/27/2023   Hypertension 01/26/2023   Hypothyroidism 01/26/2023   Confusion and disorientation / subacute cva. 01/26/2023   Type 2 diabetes mellitus with hyperglycemia, without long-term current use of insulin (HCC) 08/31/2019   PCP:  [name], MD Pharmacy:   [facility] [address]), [place] - [address]) [place] [address] Phone: [phone] Fax: [phone]     Social Determinants of Health (SDOH) Social History: SDOH Screenings   Food Insecurity: No Food Insecurity (04/05/2023)  Housing: Patient Declined (04/05/2023)  Transportation Needs: No  Transportation Needs (04/05/2023)  Utilities: Not At Risk (04/05/2023)  Tobacco Use: Medium Risk (04/05/2023)   SDOH Interventions:     Readmission Risk Interventions    02/07/2023    3:03 PM  Readmission Risk Prevention Plan  Transportation Screening Complete  PCP or Specialist Appt within 5-7 Days Complete  Home Care Screening Complete  Medication Review (RN CM) Complete

## 2023-04-07 NOTE — Plan of Care (Signed)
  Problem: Education: Goal: Knowledge of disease or condition will improve Outcome: Progressing Goal: Knowledge of secondary prevention will improve (MUST DOCUMENT ALL) Outcome: Progressing Goal: Knowledge of patient specific risk factors will improve (Mark N/A or DELETE if not current risk factor) Outcome: Progressing   Problem: Ischemic Stroke/TIA Tissue Perfusion: Goal: Complications of ischemic stroke/TIA will be minimized Outcome: Progressing   Problem: Coping: Goal: Will verbalize positive feelings about self Outcome: Progressing Goal: Will identify appropriate support needs Outcome: Progressing   Problem: Health Behavior/Discharge Planning: Goal: Ability to manage health-related needs will improve Outcome: Progressing Goal: Goals will be collaboratively established with patient/family Outcome: Progressing   Problem: Self-Care: Goal: Ability to participate in self-care as condition permits will improve Outcome: Progressing Goal: Verbalization of feelings and concerns over difficulty with self-care will improve Outcome: Progressing Goal: Ability to communicate needs accurately will improve Outcome: Progressing   Problem: Nutrition: Goal: Risk of aspiration will decrease Outcome: Progressing Goal: Dietary intake will improve Outcome: Progressing   Problem: Fluid Volume: Goal: Hemodynamic stability will improve Outcome: Progressing   Problem: Clinical Measurements: Goal: Diagnostic test results will improve Outcome: Progressing Goal: Signs and symptoms of infection will decrease Outcome: Progressing   Problem: Respiratory: Goal: Ability to maintain adequate ventilation will improve Outcome: Progressing   Problem: Education: Goal: Ability to describe self-care measures that may prevent or decrease complications (Diabetes Survival Skills Education) will improve Outcome: Progressing Goal: Individualized Educational Video(s) Outcome: Progressing   Problem:  Coping: Goal: Ability to adjust to condition or change in health will improve Outcome: Progressing   Problem: Fluid Volume: Goal: Ability to maintain a balanced intake and output will improve Outcome: Progressing   Problem: Health Behavior/Discharge Planning: Goal: Ability to identify and utilize available resources and services will improve Outcome: Progressing Goal: Ability to manage health-related needs will improve Outcome: Progressing   Problem: Metabolic: Goal: Ability to maintain appropriate glucose levels will improve Outcome: Progressing   Problem: Nutritional: Goal: Maintenance of adequate nutrition will improve Outcome: Progressing Goal: Progress toward achieving an optimal weight will improve Outcome: Progressing   Problem: Skin Integrity: Goal: Risk for impaired skin integrity will decrease Outcome: Progressing   Problem: Tissue Perfusion: Goal: Adequacy of tissue perfusion will improve Outcome: Progressing   Problem: Education: Goal: Knowledge of General Education information will improve Description: Including pain rating scale, medication(s)/side effects and non-pharmacologic comfort measures Outcome: Progressing   Problem: Health Behavior/Discharge Planning: Goal: Ability to manage health-related needs will improve Outcome: Progressing   Problem: Clinical Measurements: Goal: Ability to maintain clinical measurements within normal limits will improve Outcome: Progressing Goal: Will remain free from infection Outcome: Progressing Goal: Diagnostic test results will improve Outcome: Progressing Goal: Respiratory complications will improve Outcome: Progressing Goal: Cardiovascular complication will be avoided Outcome: Progressing   Problem: Activity: Goal: Risk for activity intolerance will decrease Outcome: Progressing   Problem: Nutrition: Goal: Adequate nutrition will be maintained Outcome: Progressing   Problem: Coping: Goal: Level of  anxiety will decrease Outcome: Progressing   Problem: Elimination: Goal: Will not experience complications related to bowel motility Outcome: Progressing Goal: Will not experience complications related to urinary retention Outcome: Progressing   Problem: Pain Managment: Goal: General experience of comfort will improve Outcome: Progressing   Problem: Safety: Goal: Ability to remain free from injury will improve Outcome: Progressing   Problem: Skin Integrity: Goal: Risk for impaired skin integrity will decrease Outcome: Progressing   

## 2023-04-07 NOTE — [place] FL2 (Signed)
[place] MEDICAID FL2 LEVEL OF CARE FORM     IDENTIFICATION  Patient Name: Barbara Butler Birthdate: 03/02/1930 Sex: female Admission Date (Current Location): 04/05/2023  [facility] and [place] Number:  Chiropodist and Address:  [facility], [address] [place], [place] [address]      Provider Number: [id number]  Attending Physician Name and Address:  Lurene Shadow, MD  Relative Name and Phone Number:       Current Level of Care: Hospital Recommended Level of Care: Skilled Nursing Facility Prior Approval Number:    Date Approved/Denied:   PASRR Number: [id number]  Discharge Plan: SNF    Current Diagnoses: Patient Active Problem List   Diagnosis Date Noted   Sepsis secondary to UTI (HCC) 04/05/2023   Diverticulosis of colon 04/05/2023   Cholelithiasis 04/05/2023   Leg swelling 04/05/2023   Fall 02/06/2023   Fall at nursing home, initial encounter 02/05/2023   Closed right hip fracture, initial encounter (HCC) 02/05/2023   Humeral surgical neck fracture 02/05/2023   DNR (do not resuscitate) 02/05/2023   Atrial flutter (HCC) 01/28/2023   Atrial fibrillation/flutter (HCC) 01/28/2023   Cerebrovascular accident (CVA) (HCC) 01/27/2023   Hypertension 01/26/2023   Hypothyroidism 01/26/2023   Confusion and disorientation / subacute cva. 01/26/2023   Type 2 diabetes mellitus with hyperglycemia, without long-term current use of insulin (HCC) 08/31/2019    Orientation RESPIRATION BLADDER Height & Weight     Self, Time, Situation, Place  Normal Incontinent Weight: 136 lb 0.4 oz (61.7 kg) Height:  5\' 5"  (165.1 cm)  BEHAVIORAL SYMPTOMS/MOOD NEUROLOGICAL BOWEL NUTRITION STATUS   (None)  (None) Continent Diet (Heart healthy/carb modified)  AMBULATORY STATUS COMMUNICATION OF NEEDS Skin   Limited Assist Verbally Bruising                       Personal Care Assistance Level of Assistance  Bathing, Dressing, Feeding Bathing  Assistance: Limited assistance Feeding assistance: Limited assistance Dressing Assistance: Limited assistance     Functional Limitations Info  Sight, Hearing, Speech Sight Info: Adequate Hearing Info: Adequate Speech Info: Adequate    SPECIAL CARE FACTORS FREQUENCY  PT (By licensed PT), OT (By licensed OT)     PT Frequency: 5 x week OT Frequency: 5 x week            Contractures Contractures Info: Not present    Additional Factors Info  Code Status, Allergies Code Status Info: Full code Allergies Info: NKDA           Current Medications (04/07/2023):  This is the current hospital active medication list Current Facility-Administered Medications  Medication Dose Route Frequency Provider Last Rate Last Admin   acetaminophen (TYLENOL) tablet 650 mg  650 mg Oral Q6H PRN Cox, Amy N, DO       Or   acetaminophen (TYLENOL) suppository 650 mg  650 mg Rectal Q6H PRN Cox, Amy N, DO       amLODipine (NORVASC) tablet 5 mg  5 mg Oral Daily Cox, Amy N, DO   5 mg at 04/07/23 5621   apixaban (ELIQUIS) tablet 2.5 mg  2.5 mg Oral BID Lurene Shadow, MD       ascorbic acid (VITAMIN C) tablet 1,000 mg  1,000 mg Oral Q1200 Cox, Amy N, DO   1,000 mg at 04/06/23 1147   cefTRIAXone (ROCEPHIN) 2 g in sodium chloride 0.9 % 100 mL IVPB  2 g Intravenous Q24H Cox, Amy N, DO 200  mL/hr at 04/07/23 0917 2 g at 04/07/23 4540   cholecalciferol (VITAMIN D3) 25 MCG (1000 UNIT) tablet 1,000 Units  1,000 Units Oral Q1200 Cox, Amy N, DO   1,000 Units at 04/06/23 1147   feeding supplement (ENSURE ENLIVE / ENSURE PLUS) liquid 237 mL  237 mL Oral Q24H Cox, Amy N, DO   237 mL at 04/06/23 1522   hydrALAZINE (APRESOLINE) injection 5 mg  5 mg Intravenous Q6H PRN Cox, Amy N, DO       hydrALAZINE (APRESOLINE) tablet 50 mg  50 mg Oral BID Cox, Amy N, DO   50 mg at 04/07/23 9811   influenza vaccine adjuvanted (FLUAD) injection 0.5 mL  0.5 mL Intramuscular Tomorrow-1000 Lurene Shadow, MD       insulin aspart (novoLOG)  injection 0-5 Units  0-5 Units Subcutaneous QHS Cox, Amy N, DO       insulin aspart (novoLOG) injection 0-9 Units  0-9 Units Subcutaneous TID WC Cox, Amy N, DO   2 Units at 04/07/23 0745   levothyroxine (SYNTHROID) tablet 88 mcg  88 mcg Oral QAC breakfast Cox, Amy N, DO   88 mcg at 04/07/23 0745   methocarbamol (ROBAXIN) tablet 500 mg  500 mg Oral Q6H PRN Cox, Amy N, DO       metoprolol succinate (TOPROL-XL) 24 hr tablet 50 mg  50 mg Oral Daily Cox, Amy N, DO   50 mg at 04/07/23 0917   ondansetron (ZOFRAN) tablet 4 mg  4 mg Oral Q6H PRN Cox, Amy N, DO       Or   ondansetron (ZOFRAN) injection 4 mg  4 mg Intravenous Q6H PRN Cox, Amy N, DO       oxyCODONE (Oxy IR/ROXICODONE) immediate release tablet 2.5 mg  2.5 mg Oral Q4H PRN Cox, Amy N, DO       pneumococcal 20-valent conjugate vaccine (PREVNAR 20) injection 0.5 mL  0.5 mL Intramuscular Tomorrow-1000 Lurene Shadow, MD       polyethylene glycol (MIRALAX / GLYCOLAX) packet 17 g  17 g Oral Daily PRN Cox, Amy N, DO       potassium chloride SA (KLOR-CON M) CR tablet 40 mEq  40 mEq Oral Once Lurene Shadow, MD       senna-docusate (Senokot-S) tablet 1 tablet  1 tablet Oral QHS PRN Cox, Amy N, DO         Discharge Medications: Please see discharge summary for a list of discharge medications.  Relevant Imaging Results:  Relevant Lab Results:   Additional Information SS#: [id number]  Margarito Liner, LCSW

## 2023-04-08 DIAGNOSIS — R6520 Severe sepsis without septic shock: Secondary | ICD-10-CM | POA: Diagnosis not present

## 2023-04-08 DIAGNOSIS — A419 Sepsis, unspecified organism: Secondary | ICD-10-CM | POA: Diagnosis not present

## 2023-04-08 DIAGNOSIS — N390 Urinary tract infection, site not specified: Secondary | ICD-10-CM | POA: Diagnosis not present

## 2023-04-08 LAB — GLUCOSE, CAPILLARY: Glucose-Capillary: 189 mg/dL — ABNORMAL HIGH (ref 70–99)

## 2023-04-08 LAB — HEMOGLOBIN AND HEMATOCRIT, BLOOD
HCT: 38.1 % (ref 36.0–46.0)
Hemoglobin: 12.7 g/dL (ref 12.0–15.0)

## 2023-04-08 LAB — POTASSIUM: Potassium: 3.9 mmol/L (ref 3.5–5.1)

## 2023-04-08 LAB — MAGNESIUM: Magnesium: 2.2 mg/dL (ref 1.7–2.4)

## 2023-04-08 MED ORDER — CEPHALEXIN 500 MG PO CAPS: 500.0000 mg | ORAL_CAPSULE | Freq: Two times a day (BID) | ORAL | Status: AC

## 2023-04-08 NOTE — TOC Transition Note (Signed)
Transition of Care [facility]) - CM/SW Discharge Note   Patient Details  Name: Barbara Butler MRN: [id number] Date of Birth: 1929-11-18  [facility]) CM/SW Contact:  Chapman Fitch, RN Phone Number: 04/08/2023, 11:27 AM   Clinical Narrative:      Patient will DC to: [facility] Anticipated DC date: 04/08/23  Family notified: Daughter in Social worker at bedside Transport by: Wendie Simmer  Per MD patient ready for DC to . RN, patient, patient's family, and facility notified of DC. Discharge Summary sent to facility. RN given number for report. DC packet on chart. Ambulance transport requested for patient.  TOC signing off.        Patient Goals and CMS Choice      Discharge Placement                         Discharge Plan and Services Additional resources added to the After Visit Summary for                                       Social Determinants of Health (SDOH) Interventions SDOH Screenings   Food Insecurity: No Food Insecurity (04/05/2023)  Housing: Patient Declined (04/05/2023)  Transportation Needs: No Transportation Needs (04/05/2023)  Utilities: Not At Risk (04/05/2023)  Tobacco Use: Medium Risk (04/05/2023)     Readmission Risk Interventions    02/07/2023    3:03 PM  Readmission Risk Prevention Plan  Transportation Screening Complete  PCP or Specialist Appt within 5-7 Days Complete  Home Care Screening Complete  Medication Review (RN CM) Complete

## 2023-04-08 NOTE — TOC Progression Note (Signed)
Transition of Care [facility]) - Progression Note    Patient Details  Name: Barbara Butler MRN: [id number] Date of Birth: 09-24-1929  Transition of Care [facility]) CM/SW Contact  Chapman Fitch, RN Phone Number: 04/08/2023, 10:46 AM  Clinical Narrative:     Berkley Harvey received for patient to return to [facility].  MD notified, EMS packet on chart   Expected Discharge Plan: Skilled Nursing Facility    Expected Discharge Plan and Services                                               Social Determinants of Health (SDOH) Interventions SDOH Screenings   Food Insecurity: No Food Insecurity (04/05/2023)  Housing: Patient Declined (04/05/2023)  Transportation Needs: No Transportation Needs (04/05/2023)  Utilities: Not At Risk (04/05/2023)  Tobacco Use: Medium Risk (04/05/2023)    Readmission Risk Interventions    02/07/2023    3:03 PM  Readmission Risk Prevention Plan  Transportation Screening Complete  PCP or Specialist Appt within 5-7 Days Complete  Home Care Screening Complete  Medication Review (RN CM) Complete

## 2023-04-08 NOTE — Care Management Important Message (Signed)
Important Message  Patient Details  Name: Barbara Butler MRN: [id number] Date of Birth: 1930/01/23   Important Message Given:  Yes - Medicare IM     Johnell Comings 04/08/2023, 11:36 AM

## 2023-04-08 NOTE — Progress Notes (Signed)
Occupational Therapy Treatment Patient Details Name: Barbara Butler MRN: [id number] DOB: 09/21/1929 Today's Date: 04/08/2023   History of present illness Pt is a 89 y/o F admitted on 04/05/23 after presenting with c/o blood in stool. Pt is being treated for sepsis 2/2 UTI. PMH: R hip proximal femoral fx, proximal R humeral neck fx, CVA, a-fib on eliquis, HLD, NIDDM, hypothyroid, HTN   OT comments  Ms Barwick was seen for OT treatment on this date. Upon arrival to room pt reclined in bed, agreeable to tx. Pt requires CGA + RW for toilet t/f, pericare, and standing grooming. While backing up from sink - 1 total posterior LOB requiring stagger step and MAX A to correct. Pt making good progress toward goals, will continue to follow POC. Discharge recommendation remains appropriate.       If plan is discharge home, recommend the following:  A little help with walking and/or transfers;A little help with bathing/dressing/bathroom;Help with stairs or ramp for entrance   Equipment Recommendations  BSC/3in1    Recommendations for Other Services      Precautions / Restrictions Precautions Precautions: Fall Restrictions Weight Bearing Restrictions: No       Mobility Bed Mobility Overal bed mobility: Needs Assistance Bed Mobility: Supine to Sit     Supine to sit: Supervision, HOB elevated, Used rails          Transfers Overall transfer level: Needs assistance Equipment used: Rolling walker (2 wheels) Transfers: Sit to/from Stand Sit to Stand: Contact guard assist                 Balance Overall balance assessment: Needs assistance Sitting-balance support: Feet supported, No upper extremity supported Sitting balance-Leahy Scale: Good     Standing balance support: No upper extremity supported, During functional activity Standing balance-Leahy Scale: Fair                             ADL either performed or assessed with clinical judgement   ADL Overall  ADL's : Needs assistance/impaired                                       General ADL Comments: CGA + RW for toilet t/f, pericare, and standing grooming; total posterior LOB requiring MAX A to correct.      Cognition Arousal: Alert Behavior During Therapy: WFL for tasks assessed/performed Overall Cognitive Status: History of cognitive impairments - at baseline                                                     Pertinent Vitals/ Pain       Pain Assessment Pain Assessment: No/denies pain   Frequency  Min 1X/week        Progress Toward Goals  OT Goals(current goals can now be found in the care plan section)  Progress towards OT goals: Progressing toward goals  Acute Rehab OT Goals Patient Stated Goal: to go home OT Goal Formulation: With patient Time For Goal Achievement: 04/20/23 Potential to Achieve Goals: Good ADL Goals Pt Will Perform Grooming: with modified independence;standing Pt Will Perform Lower Body Dressing: with modified independence;sit to/from stand Pt Will Transfer to Toilet: with modified independence;ambulating;regular height toilet  Pt Will Perform Toileting - Clothing Manipulation and hygiene: with modified independence;sitting/lateral leans  Plan      Co-evaluation                 AM-PAC OT "6 Clicks" Daily Activity     Outcome Measure   Help from another person eating meals?: None Help from another person taking care of personal grooming?: A Little Help from another person toileting, which includes using toliet, bedpan, or urinal?: A Little Help from another person bathing (including washing, rinsing, drying)?: A Lot Help from another person to put on and taking off regular upper body clothing?: A Little Help from another person to put on and taking off regular lower body clothing?: A Lot 6 Click Score: 17    End of Session Equipment Utilized During Treatment: Rolling walker (2 wheels)  OT Visit  Diagnosis: Unsteadiness on feet (R26.81);Other abnormalities of gait and mobility (R26.89)   Activity Tolerance Patient tolerated treatment well   Patient Left in chair;with call bell/phone within reach;with chair alarm set   Nurse Communication          Time: 0950-1004 OT Time Calculation (min): 14 min  Charges: OT General Charges $OT Visit: 1 Visit OT Treatments $Self Care/Home Management : 8-22 mins  Kathie Dike, M.S. OTR/L  04/08/23, 10:08 AM  ascom [phone]

## 2023-04-08 NOTE — Discharge Summary (Addendum)
Physician Discharge Summary   Patient: Barbara Butler MRN: [id number] DOB: 1930-04-13  Admit date:     04/05/2023  Discharge date: 04/08/23  Discharge Physician: Lurene Shadow   PCP: [name], MD   Recommendations at discharge:   Follow-up with physician at the nursing home within 3 days of discharge  Discharge Diagnoses: Principal Problem:   Severe sepsis (HCC) Active Problems:   Type 2 diabetes mellitus with hyperglycemia, without long-term current use of insulin (HCC)   Hypertension   Hypothyroidism   Cerebrovascular accident (CVA) (HCC)   Atrial fibrillation/flutter (HCC)   Humeral surgical neck fracture   Sepsis secondary to UTI (HCC)   Diverticulosis of colon   Cholelithiasis   Leg swelling  Resolved Problems:   * No resolved hospital problems. *  Hospital Course:  Barbara Butler is a 89 y.o. female with history of close right hip proximal femoral fracture, history of proximal right humeral neck, history of CVA and atrial fibrillation on Eliquis, hyperlipidemia, non-insulin-dependent diabetes mellitus, hypothyroid, hypertension, who was brought to the hospital because of concern for rectal bleeding.  Patient said that she was informed by the nursing staff at the nursing facility that she had bloody stools but she did not see a stools herself.  Per Dr. Jefferey Pica (ED physician) note patient had an episode of diarrhea in the emergency department but there was no gross blood or melena.  Assessment and Plan:  Severe sepsis secondary to acute E. coli UTI: Urine culture showed E. coli. She was initially treated with 3 days of IV ceftriaxone.  Continue Keflex for 2 days. No growth on blood cultures     Atrial fibrillation with RVR: Heart rate is okay.  Continue metoprolol and Eliquis.     Mild rectal bleeding is likely from hemorrhoids: Patient found to have external hemorrhoids on rectal exam.  She confirmed that she has a history of hemorrhoids but has not had  any significant rectal bleeding from this. H&H is stable.     Hypokalemia: Improved.     Recent IM nailing of right femur on 02/06/2023, recent fracture of surgical neck of the right humerus     Other comorbidities include hypertension, hypothyroidism, colonic diverticulosis, cholelithiasis  Her condition has improved and she is deemed stable for discharge to SNF today.      Consultants: None Procedures performed: None  Disposition: Skilled nursing facility Diet recommendation:  Discharge Diet Orders (From admission, onward)     Start     Ordered   04/08/23 0000  Diet - low sodium heart healthy        04/08/23 1051           Cardiac diet DISCHARGE MEDICATION: Allergies as of 04/08/2023   No Known Allergies      Medication List     STOP taking these medications    oxyCODONE 5 MG immediate release tablet Commonly known as: Oxy IR/ROXICODONE       TAKE these medications    acetaminophen 325 MG tablet Commonly known as: TYLENOL Take 1-2 tablets (325-650 mg total) by mouth every 6 (six) hours as needed for mild pain (pain score 1-3 or temp > 100.5).   amLODipine 5 MG tablet Commonly known as: NORVASC Take 5 mg by mouth daily.   apixaban 2.5 MG Tabs tablet Commonly known as: Eliquis Take 1 tablet (2.5 mg total) by mouth 2 (two) times daily.   cephALEXin 500 MG capsule Commonly known as: KEFLEX Take 1 capsule (500 mg total)  by mouth 2 (two) times daily for 2 days.   cholecalciferol 25 MCG (1000 UNIT) tablet Commonly known as: VITAMIN D3 Take 1,000 Units by mouth daily at 12 noon.   feeding supplement Liqd Take 237 mLs by mouth daily.   furosemide 20 MG tablet Commonly known as: LASIX Take 20 mg by mouth daily.   hydrALAZINE 50 MG tablet Commonly known as: APRESOLINE Take 50 mg by mouth 2 (two) times a day.   levothyroxine 88 MCG tablet Commonly known as: SYNTHROID Take 88 mcg by mouth daily.   metFORMIN 500 MG tablet Commonly known as:  GLUCOPHAGE Take 500 mg by mouth 2 (two) times daily.   methocarbamol 500 MG tablet Commonly known as: ROBAXIN Take 1 tablet (500 mg total) by mouth every 6 (six) hours as needed for muscle spasms.   metoprolol succinate 50 MG 24 hr tablet Commonly known as: TOPROL-XL Take 1 tablet (50 mg total) by mouth daily. Take with or immediately following a meal.   polyethylene glycol 17 g packet Commonly known as: MIRALAX / GLYCOLAX Take 17 g by mouth daily as needed for mild constipation.   saccharomyces boulardii 250 MG capsule Commonly known as: FLORASTOR Take 250 mg by mouth 2 (two) times daily.   vitamin C 1000 MG tablet Take 1,000 mg by mouth daily at 12 noon.        Discharge Exam: Filed Weights   04/05/23 0906  Weight: 61.7 kg   GEN: NAD SKIN: Warm and dry EYES: No pallor or icterus ENT: MMM CV: Irregular rate and rhythm PULM: CTA B ABD: soft, ND, NT, +BS CNS: AAO x 3, non focal EXT: No edema or tenderness   Condition at discharge: good  The results of significant diagnostics from this hospitalization (including imaging, microbiology, ancillary and laboratory) are listed below for reference.   Imaging Studies: CT ANGIO GI BLEED  Result Date: 04/05/2023 CLINICAL DATA:  Lower GI bleed, bloody stools since this morning, recent right hip fracture EXAM: CTA ABDOMEN AND PELVIS WITHOUT AND WITH CONTRAST TECHNIQUE: Multidetector CT imaging of the abdomen and pelvis was performed using the standard protocol during bolus administration of intravenous contrast. Multiplanar reconstructed images and MIPs were obtained and reviewed to evaluate the vascular anatomy. RADIATION DOSE REDUCTION: This exam was performed according to the departmental dose-optimization program which includes automated exposure control, adjustment of the mA and/or kV according to patient size and/or use of iterative reconstruction technique. CONTRAST:  75mL OMNIPAQUE IOHEXOL 350 MG/ML SOLN COMPARISON:  CT  chest abdomen pelvis, 02/05/2023 FINDINGS: VASCULAR Normal contour and caliber of the abdominal aorta. No evidence of aneurysm, dissection, or other acute aortic pathology. Standard branching pattern of the abdominal aorta with solitary bilateral renal arteries. Moderate mixed calcific atherosclerosis. Review of the MIP images confirms the above findings. NON-VASCULAR Lower Chest: No acute findings. Mild bibasilar fibrosis. Cardiomegaly and coronary artery calcifications. Hepatobiliary: No solid liver abnormality is seen. Simple benign left lobe liver cysts, for which no further follow-up or characterization is required. Gallstones. No gallbladder wall thickening. Similar common bile duct dilatation measuring up to 1.0 cm in caliber. No calculus or other obstruction identified to the ampulla. Pancreas: Unchanged appearance of the pancreas with diffuse dilatation of the pancreatic duct to the ampulla, measuring up to 1.0 cm in caliber, with mild, diffuse pancreatic parenchymal atrophy. No solid lesion or suspicious contrast enhancement. This appearance is unchanged in comparison to examinations dating back to at least 2013 and is benign, requiring no further follow-up or  characterization. Spleen: Normal in size without significant abnormality. Adrenals/Urinary Tract: Adrenal glands are unremarkable. Simple, benign bilateral renal cortical cysts, for which no further follow-up or characterization is required. Kidneys are otherwise normal, without renal calculi, solid lesion, or hydronephrosis. Bladder is unremarkable. Stomach/Bowel: Stomach is within normal limits. Appendix appears normal. No evidence of bowel wall thickening, distention, or inflammatory changes. Pancolonic diverticulosis. No evidence of intraluminal contrast extravasation or other findings to specifically localize GI bleeding. Lymphatic: No enlarged abdominal or pelvic lymph nodes. Reproductive: Calcified fibroids. Other: Fat containing right  inguinal hernia. Anasarca. No ascites. Musculoskeletal: No acute osseous findings. Status post right hip intramedullary nail fixation with subacute intratrochanteric fractures. Unchanged superior endplate wedge deformities of L1, L2, and L3. IMPRESSION: 1. No evidence of intraluminal contrast extravasation or other findings to specifically localize GI bleeding. 2. Pancolonic diverticulosis without evidence of acute diverticulitis. 3. Cholelithiasis without evidence of acute cholecystitis. Similar common bile duct dilatation measuring up to 1.0 cm in caliber. No calculus or other obstruction identified to the ampulla. 4. Status post right hip intramedullary nail fixation with subacute intratrochanteric fractures. 5. Unchanged superior endplate wedge deformities of L1, L2, and L3. Aortic Atherosclerosis (ICD10-I70.0). Electronically Signed   By: Jearld Lesch M.D.   On: 04/05/2023 12:40    Microbiology: Results for orders placed or performed during the hospital encounter of 04/05/23  Urine Culture     Status: Abnormal   Collection Time: 04/05/23  9:38 AM   Specimen: Urine, Random  Result Value Ref Range Status   Specimen Description   Final    URINE, RANDOM Performed at [facility], [address] [place], [place] [address]    Special Requests   Final    NONE Reflexed from [id number] Performed at [facility], [address] [place], [place] [address]    Culture >=100,000 COLONIES/mL ESCHERICHIA COLI (A)  Final   Report Status 04/07/2023 FINAL  Final   Organism ID, Bacteria ESCHERICHIA COLI (A)  Final      Susceptibility   Escherichia coli - MIC*    AMPICILLIN >=32 RESISTANT Resistant     CEFAZOLIN <=4 SENSITIVE Sensitive     CEFEPIME <=0.12 SENSITIVE Sensitive     CEFTRIAXONE <=0.25 SENSITIVE Sensitive     CIPROFLOXACIN 0.5 INTERMEDIATE Intermediate     GENTAMICIN <=1 SENSITIVE Sensitive     IMIPENEM <=0.25 SENSITIVE Sensitive     NITROFURANTOIN <=16 SENSITIVE Sensitive      TRIMETH/SULFA <=20 SENSITIVE Sensitive     AMPICILLIN/SULBACTAM >=32 RESISTANT Resistant     PIP/TAZO <=4 SENSITIVE Sensitive     * >=100,000 COLONIES/mL ESCHERICHIA COLI  Blood culture (routine x 2)     Status: None (Preliminary result)   Collection Time: 04/05/23 12:51 PM   Specimen: BLOOD  Result Value Ref Range Status   Specimen Description BLOOD BLOOD LEFT HAND  Final   Special Requests   Final    BOTTLES DRAWN AEROBIC AND ANAEROBIC Blood Culture adequate volume   Culture   Final    NO GROWTH 3 DAYS Performed at [facility], [address], [place], [place] [address]    Report Status PENDING  Incomplete  Blood culture (routine x 2)     Status: None (Preliminary result)   Collection Time: 04/05/23  3:55 PM   Specimen: BLOOD  Result Value Ref Range Status   Specimen Description BLOOD BLOOD RIGHT HAND  Final   Special Requests   Final    BOTTLES DRAWN AEROBIC ONLY Blood  Culture adequate volume   Culture   Final    NO GROWTH 3 DAYS Performed at [facility], [address] [place], [place] [address]    Report Status PENDING  Incomplete    Labs: CBC: Recent Labs  Lab 04/05/23 0912 04/06/23 0356 04/06/23 1356 04/07/23 0316 04/08/23 0550  WBC 5.4 4.7 6.0 5.0  --   NEUTROABS 3.1  --   --   --   --   HGB 14.8 12.3 13.2 12.5 12.7  HCT 45.2 35.8* 40.2 37.6 38.1  MCV 90.4 86.9 89.5 88.9  --   PLT 315 256 288 269  --    Basic Metabolic Panel: Recent Labs  Lab 04/05/23 0912 04/06/23 0356 04/07/23 0316 04/08/23 0550  NA 138 139 137  --   K 4.0 3.3* 3.6 3.9  CL 103 105 103  --   CO2 23 25 24   --   GLUCOSE 172* 147* 165*  --   BUN 20 14 19   --   CREATININE 0.71 0.57 0.84  --   CALCIUM 9.3 8.8* 9.0  --   MG  --  1.7 1.8 2.2   Liver Function Tests: Recent Labs  Lab 04/05/23 0912  AST 23  ALT 12  ALKPHOS 102  BILITOT 0.8  PROT 7.9  ALBUMIN 4.0   CBG: Recent Labs  Lab 04/07/23 0723 04/07/23 1126 04/07/23 1633 04/07/23 2136  04/08/23 0818  GLUCAP 191* 172* 207* 197* 189*    Discharge time spent: greater than 30 minutes.  Signed: Lurene Shadow, MD [facility] 04/08/2023

## 2023-04-10 LAB — CULTURE, BLOOD (ROUTINE X 2)
Culture: NO GROWTH
Culture: NO GROWTH
Special Requests: ADEQUATE
Special Requests: ADEQUATE

## 2023-05-06 ENCOUNTER — Ambulatory Visit: Payer: Medicare PPO | Admitting: Medical

## 2023-05-26 ENCOUNTER — Other Ambulatory Visit: Payer: Self-pay | Admitting: Medical

## 2023-05-26 DIAGNOSIS — I4891 Unspecified atrial fibrillation: Secondary | ICD-10-CM

## 2023-05-26 MED ORDER — FUROSEMIDE 20 MG PO TABS: 20.0000 mg | ORAL_TABLET | Freq: Every day | ORAL | 0 refills | Status: DC

## 2023-05-26 NOTE — Telephone Encounter (Signed)
Prescription refill request for Eliquis received. Indication: Afib  Last office visit: 03/03/23 Barbara Butler)  Scr: 0.84 (04/07/23)  Age: 89 Weight: 61.7kg  Per dosing criteria, current dose not appropriate. Will forward to PharmD for review.

## 2023-05-26 NOTE — Telephone Encounter (Signed)
Refill request for Eliquis

## 2023-05-26 NOTE — Telephone Encounter (Signed)
*  STAT* If patient is at the pharmacy, call can be transferred to refill team.   1. Which medications need to be refilled? (please list name of each medication and dose if known) apixaban (ELIQUIS) 2.5 MG TABS tablet   furosemide (LASIX) 20 MG tablet    2. Which pharmacy/location (including street and city if local pharmacy) is medication to be sent to?  [id number] - [place] ([place]), [place] - [address]    3. Do they need a 30 day or 90 day supply? 30   Patient is completely out and needs meds for today.

## 2023-05-26 NOTE — Telephone Encounter (Addendum)
Last office visit 03/03/23--Continue current Lasix dose for now-- with plan to f/u in 2 months  next office visit:  05/27/23   Requested Prescriptions   Signed Prescriptions Disp Refills   furosemide (LASIX) 20 MG tablet 30 tablet 0    Sig: Take 1 tablet (20 mg total) by mouth daily.    Authorizing Provider: Marianne Sofia    Ordering User: Guerry Minors

## 2023-05-27 ENCOUNTER — Encounter: Payer: Self-pay | Admitting: Medical

## 2023-05-27 ENCOUNTER — Ambulatory Visit: Payer: Medicare PPO | Attending: Medical | Admitting: Medical

## 2023-05-27 VITALS — BP 136/81 | HR 78 | Ht 64.0 in | Wt 125.0 lb

## 2023-05-27 DIAGNOSIS — I10 Essential (primary) hypertension: Secondary | ICD-10-CM

## 2023-05-27 DIAGNOSIS — D649 Anemia, unspecified: Secondary | ICD-10-CM

## 2023-05-27 DIAGNOSIS — I5032 Chronic diastolic (congestive) heart failure: Secondary | ICD-10-CM

## 2023-05-27 DIAGNOSIS — I4891 Unspecified atrial fibrillation: Secondary | ICD-10-CM | POA: Diagnosis not present

## 2023-05-27 DIAGNOSIS — I4892 Unspecified atrial flutter: Secondary | ICD-10-CM

## 2023-05-27 DIAGNOSIS — R600 Localized edema: Secondary | ICD-10-CM | POA: Diagnosis not present

## 2023-05-27 DIAGNOSIS — Z8673 Personal history of transient ischemic attack (TIA), and cerebral infarction without residual deficits: Secondary | ICD-10-CM | POA: Diagnosis not present

## 2023-05-27 MED ORDER — METOPROLOL SUCCINATE ER 50 MG PO TB24: 50.0000 mg | ORAL_TABLET | Freq: Every day | ORAL | 3 refills | Status: DC

## 2023-05-27 MED ORDER — METOPROLOL SUCCINATE ER 50 MG PO TB24: 50.0000 mg | ORAL_TABLET | Freq: Every day | ORAL | 3 refills | Status: AC

## 2023-05-27 MED ORDER — HYDRALAZINE HCL 50 MG PO TABS: 50.0000 mg | ORAL_TABLET | Freq: Two times a day (BID) | ORAL | 3 refills | Status: DC

## 2023-05-27 MED ORDER — FUROSEMIDE 20 MG PO TABS: 20.0000 mg | ORAL_TABLET | Freq: Every day | ORAL | 3 refills | Status: DC

## 2023-05-27 MED ORDER — APIXABAN 5 MG PO TABS: 5.0000 mg | ORAL_TABLET | Freq: Two times a day (BID) | ORAL | 2 refills | Status: DC

## 2023-05-27 MED ORDER — AMLODIPINE BESYLATE 5 MG PO TABS: 5.0000 mg | ORAL_TABLET | Freq: Every day | ORAL | 3 refills | Status: DC

## 2023-05-27 NOTE — Progress Notes (Signed)
Cardiology Office Note:    Date:  05/27/2023   ID:  [name], DOB Jul 16, 1929, MRN [id number]  PCP:  [name], MD  [facility] Cardiologist:  Lorine Bears, MD  [facility] Electrophysiologist:  None   Referring MD: [name], MD   Chief Complaint: Hospital follow-up  History of Present Illness:    Barbara Butler is a 89 y.o. female with a hx of hypertension, hyperlipidemia, diabetes type 2, hypothyroidism, anemia, Afib on Eliquis,  and recent stroke who is being seen for hospital follow-up.   Patient presented to [facility] ED 01/26/2023 for altered mental status.  In the ER she was noted to be in A-fib/flutter with heart rate of 97 bpm.  Imaging of the head showed acute to subacute ischemic infarcts in the occipital lobes and posterior splenium.  CHA2DS2-VASc of 8.  She was started on Eliquis 5 mg twice daily.  Echo showed LVEF 70 to 75%, moderate LVH, severely dilated left atrium.  She was started on Toprol 25 mg daily.  Plan was to continue rate control given severely dilated left atrium, advanced age and asymptomatic. She was discharged to rehab. At rehab she sustained a fall and was went to the ER.    The patient was subsequently admitted 02/05/23-02/11/23 for a fall found to have a closed right hip fracture and right humerus fracture. She underwent right hip repair and was eventually discharged back to the rehab facility.   Patient was last seen in August 2024 reporting good progress at the rehab facility.  Plan was to pursue cardioversion, however patient wanted to pursue medical management.  Patient was recently admitted in October 2024 with severe sepsis secondary to E. coli UTI, mild rectal bleeding from hemorrhoids, hypokalemia, A-fib with controlled rates.  Patient was discharged to SNF.  Today, the patient is overall feeling back to normal. She is up and about at home. She denies heart fluttering or racing. She denies chest pain or SOB. No lightheadedness  or dizziness. She does have lower leg edema 1+. They need to elevate feet at night. She denies any further rectal bleeding. Center well for 90 day supply with refills. Patient is still not interested in cardioversion.   Past Medical History:  Diagnosis Date   Anemia    Diabetes mellitus without complication (HCC)    diet controlled   Diabetic neuropathy (HCC)    Dysrhythmia    Hypertension    Hypothyroidism     Past Surgical History:  Procedure Laterality Date   CATARACT EXTRACTION W/PHACO Right 01/28/2019   Procedure: CATARACT EXTRACTION PHACO AND INTRAOCULAR LENS PLACEMENT (IOC) RIGHT DIABETES;  Surgeon: Elliot Cousin, MD;  Location: [facility] ORS;  Service: Ophthalmology;  Laterality: Right;  [place] 00:49.3 CDE 8.12 Fluid Pack Lot # [id number]   CATARACT EXTRACTION W/PHACO Left 02/25/2019   Procedure: CATARACT EXTRACTION PHACO AND INTRAOCULAR LENS PLACEMENT (IOC), LEFT, DIABETIC, VISION BLUE;  Surgeon: Elliot Cousin, MD;  Location: [facility] ORS;  Service: Ophthalmology;  Laterality: Left;  [place]  01:10 CDE 11.43 Fluid pack lot # [id number]   FRACTURE SURGERY     INTRAMEDULLARY (IM) NAIL INTERTROCHANTERIC Right 02/06/2023   Procedure: INTRAMEDULLARY NAILING OF RIGHT FEMUR;  Surgeon: Roby Lofts, MD;  Location: [facility] OR;  Service: Orthopedics;  Laterality: Right;   ORIF WRIST FRACTURE Right 06/28/2016   Procedure: OPEN REDUCTION INTERNAL FIXATION (ORIF) WRIST FRACTURE;  Surgeon: Kennedy Bucker, MD;  Location: [facility] ORS;  Service: Orthopedics;  Laterality: Right;   SALPINGECTOMY  Current Medications: Current Meds  Medication Sig   apixaban (ELIQUIS) 5 MG TABS tablet Take 1 tablet (5 mg total) by mouth 2 (two) times daily.   feeding supplement (ENSURE ENLIVE / ENSURE PLUS) LIQD Take 237 mLs by mouth daily.   JANUVIA 100 MG tablet Take 100 mg by mouth daily.   levothyroxine (SYNTHROID, LEVOTHROID) 88 MCG tablet Take 88 mcg by mouth daily.    Multiple Vitamins-Minerals (CENTRUM SILVER 50+WOMEN PO) Take  1 tablet by mouth daily.   [DISCONTINUED] amLODipine (NORVASC) 5 MG tablet Take 5 mg by mouth daily.   [DISCONTINUED] furosemide (LASIX) 20 MG tablet Take 1 tablet (20 mg total) by mouth daily.   [DISCONTINUED] hydrALAZINE (APRESOLINE) 50 MG tablet Take 50 mg by mouth 2 (two) times a day.   [DISCONTINUED] metoprolol succinate (TOPROL-XL) 50 MG 24 hr tablet Take 1 tablet (50 mg total) by mouth daily. Take with or immediately following a meal.     Allergies:   Patient has no known allergies.   Social History   Socioeconomic History   Marital status: Widowed    Spouse name: Not on file   Number of children: Not on file   Years of education: Not on file   Highest education level: Not on file  Occupational History   Not on file  Tobacco Use   Smoking status: Former    Current packs/day: 0.00    Types: Cigarettes    Quit date: 08/07/1958    Years since quitting: 64.8   Smokeless tobacco: Never  Substance and Sexual Activity   Alcohol use: Not Currently   Drug use: No   Sexual activity: Never  Other Topics Concern   Not on file  Social History Narrative   Not on file   Social Determinants of Health   Financial Resource Strain: Low Risk  (05/08/2023)   Received from [facility]   Overall Financial Resource Strain (CARDIA)    Difficulty of Paying Living Expenses: Not hard at all  Food Insecurity: No Food Insecurity (05/08/2023)   Received from [facility]   Hunger Vital Sign    Worried About Running Out of Food in the Last Year: Never true    Ran Out of Food in the Last Year: Never true  Transportation Needs: No Transportation Needs (05/08/2023)   Received from [facility] - Transportation    In the past 12 months, has lack of transportation kept you from medical appointments or from getting medications?: No    Lack of Transportation (Non-Medical): No  Physical Activity: Not on file  Stress: Not on file  Social  Connections: Not on file     Family History: The patient's family history is negative for Breast cancer.  ROS:   Please see the history of present illness.     All other systems reviewed and are negative.  EKGs/Labs/Other Studies Reviewed:    The following studies were reviewed today:  Echo 01/2023 1. Left ventricular ejection fraction, by estimation, is 70 to 75%. The  left ventricle has hyperdynamic function. The left ventricle has no  regional wall motion abnormalities. There is moderate asymmetric left  ventricular hypertrophy of the basal-septal   segment. Left ventricular diastolic parameters are indeterminate.   2. Right ventricular systolic function is normal. The right ventricular  size is normal. There is normal pulmonary artery systolic pressure.   3. Left atrial size was severely dilated.   4. Right atrial size was  moderately dilated.   5. The mitral valve is degenerative. Mild to moderate mitral valve  regurgitation. No evidence of mitral stenosis. Moderate mitral annular  calcification.   6. Tricuspid valve regurgitation is moderate.   7. The aortic valve is calcified. Aortic valve regurgitation is mild.  Aortic valve sclerosis/calcification is present, without any evidence of  aortic stenosis.   8. The inferior vena cava is normal in size with greater than 50%  respiratory variability, suggesting right atrial pressure of 3 mmHg.     EKG:  EKG is ordered today.  The ekg ordered today demonstrates Afib 78bpm, RBBB, nonspecific T wave changes  Recent Labs: 01/26/2023: TSH 3.372 04/05/2023: ALT 12 04/07/2023: BUN 19; Creatinine, Ser 0.84; Platelets 269; Sodium 137 04/08/2023: Hemoglobin 12.7; Magnesium 2.2; Potassium 3.9  Recent Lipid Panel    Component Value Date/Time   CHOL 122 01/27/2023 0338   TRIG 71 01/27/2023 0338   HDL 26 (L) 01/27/2023 0338   CHOLHDL 4.7 01/27/2023 0338   VLDL 14 01/27/2023 0338   LDLCALC 82 01/27/2023 0338    Physical Exam:     VS:  BP 136/81 (BP Location: Right Arm, Patient Position: Sitting, Cuff Size: Normal)   Pulse 78   Ht 5\' 4"  (1.626 m)   Wt 125 lb (56.7 kg)   SpO2 98%   BMI 21.46 kg/m     Wt Readings from Last 3 Encounters:  05/27/23 125 lb (56.7 kg)  04/05/23 136 lb 0.4 oz (61.7 kg)  03/03/23 139 lb 14.4 oz (63.5 kg)     GEN:  Well nourished, well developed in no acute distress HEENT: Normal NECK: No JVD; No carotid bruits LYMPHATICS: No lymphadenopathy CARDIAC: Irreg Irreg, no murmurs, rubs, gallops RESPIRATORY:  Clear to auscultation without rales, wheezing or rhonchi  ABDOMEN: Soft, non-tender, non-distended MUSCULOSKELETAL:  2+ lower leg edema; No deformity  SKIN: Warm and dry NEUROLOGIC:  Alert and oriented x 3 PSYCHIATRIC:  Normal affect   ASSESSMENT:    1. Atrial fibrillation/flutter (HCC)   2. History of stroke   3. Lower leg edema   4. Chronic diastolic heart failure (HCC)   5. Anemia, unspecified type   6. Essential hypertension    PLAN:    In order of problems listed above:  Atrial fibrillation Diagnosed during admission for stroke found to have Afib. Plan was for rate control with outpatient cardioversion, but patient is not wanting to undergo cardioversion. She remains in rate controlled Afib. Continue Eliquis 5mg  BID for stroke ppx and Toprol 50mg  daily for rate control.   H/o CVA Patient is up and about at home. No residual deficits. Continue Eliquis.   Lower leg edema HFpEF Patient has 1+ lower leg edema on exam. She takes lasix 20mg  daily. She eats low salt diet. I will increase lasix to 40mg  x 3 days, then back down to lasix 20mg  daily. Continue Toprol and Hydralazine.   Anemia Recent Hgb stable at 12.7.  HTN BP reasonable today. Continue amlodipine, Hydralazine and Toprol.   Disposition: Follow up in 3 month(s) with MD/APP   Signed, [place] [facility] David Stall, PA-C  05/27/2023 1:32 PM    [facility]

## 2023-05-27 NOTE — Patient Instructions (Signed)
Medication Instructions:  Your physician recommends the following medication changes.  INCREASE: Lasix to 40 mg (2 tablets) by mouth for 3 days, then resume 20 mg daily thereafter.   *If you need a refill on your cardiac medications before your next appointment, please call your pharmacy*   Lab Work: No labs ordered today    Testing/Procedures: No test ordered today    Follow-Up: At [facility], you and your health needs are our priority.  As part of our continuing mission to provide you with exceptional heart care, we have created designated Provider Care Teams.  These Care Teams include your primary Cardiologist (physician) and Advanced Practice Providers (APPs -  Physician Assistants and Nurse Practitioners) who all work together to provide you with the care you need, when you need it.  We recommend signing up for the patient portal called "MyChart".  Sign up information is provided on this After Visit Summary.  MyChart is used to connect with patients for Virtual Visits (Telemedicine).  Patients are able to view lab/test results, encounter notes, upcoming appointments, etc.  Non-urgent messages can be sent to your provider as well.   To learn more about what you can do with MyChart, go to [email or web address].    Your next appointment:   3-4 month(s)  Provider:   Lorine Bears, MD

## 2023-05-27 NOTE — Telephone Encounter (Signed)
Per PharmD, dose should be increased to 5mg  BID. Called pt and spoke with pt and pt's daughter in law over the phone. I made them both aware her Eliquis would be changed to 5mg  BID. Pt has scheduled appt today with Cadence Fransico Michael, [place]. They will also make her aware of this change. Refill sent.

## 2023-06-12 ENCOUNTER — Telehealth: Payer: Self-pay | Admitting: Cardiology

## 2023-06-12 DIAGNOSIS — Z79899 Other long term (current) drug therapy: Secondary | ICD-10-CM

## 2023-06-12 NOTE — Telephone Encounter (Signed)
Received outpatient page from patient's daughter-in-law reporting that she has noted increased lower extremity edema as well as some puffiness in her abdomen over the past couple days.  Unfortunately there scales have broken and she has been unable to weigh her.  Has been compliant with her Lasix 20 mg daily.  Reports her blood pressures have been stable.  Advised to give an additional dose of 20 mg this evening to total 40mg .  Continue to take 40 mg tomorrow, suspect she may need to increase the dose to 40 mg daily going forward.  Will route to provider from recent office visit as patient will likely need outpatient labs set up as well to recheck electrolytes and renal function.  [name] voiced understanding and thanked me for call back.  ER precautions given.

## 2023-06-13 ENCOUNTER — Other Ambulatory Visit (HOSPITAL_COMMUNITY): Payer: Self-pay

## 2023-06-13 MED ORDER — FUROSEMIDE 20 MG PO TABS
40.0000 mg | ORAL_TABLET | Freq: Every day | ORAL | 3 refills | Status: DC
  Filled 2023-06-13: qty 90, 45d supply, fill #0

## 2023-06-13 NOTE — Addendum Note (Signed)
Addended by: Parke Poisson on: 06/13/2023 04:10 PM   Modules accepted: Orders

## 2023-06-13 NOTE — Telephone Encounter (Signed)
Pt's daughter in law made aware Medication list updated and lab orders placed   Furth, Cadence H, PA-C  You1 hour ago (3:00 PM)    If patient still has lower leg swelling after 3 days of higher lasix dose of 40mg , we can just stay at the higher dose and continue lasix 40mg  daily. BMET in 2 weeks if not already ordered.

## 2023-06-16 ENCOUNTER — Emergency Department: Payer: Medicare PPO

## 2023-06-16 ENCOUNTER — Encounter: Payer: Self-pay | Admitting: Emergency Medicine

## 2023-06-16 ENCOUNTER — Other Ambulatory Visit: Payer: Self-pay

## 2023-06-16 ENCOUNTER — Observation Stay
Admission: EM | Admit: 2023-06-16 | Discharge: 2023-06-17 | Disposition: A | Discharge status: Home / Self Care | Payer: Medicare PPO | Attending: Obstetrics and Gynecology | Admitting: Obstetrics and Gynecology

## 2023-06-16 DIAGNOSIS — Z8673 Personal history of transient ischemic attack (TIA), and cerebral infarction without residual deficits: Secondary | ICD-10-CM

## 2023-06-16 DIAGNOSIS — Z7901 Long term (current) use of anticoagulants: Secondary | ICD-10-CM | POA: Insufficient documentation

## 2023-06-16 DIAGNOSIS — Z7984 Long term (current) use of oral hypoglycemic drugs: Secondary | ICD-10-CM | POA: Diagnosis not present

## 2023-06-16 DIAGNOSIS — I4891 Unspecified atrial fibrillation: Secondary | ICD-10-CM | POA: Diagnosis not present

## 2023-06-16 DIAGNOSIS — G9341 Metabolic encephalopathy: Secondary | ICD-10-CM

## 2023-06-16 DIAGNOSIS — Z79899 Other long term (current) drug therapy: Secondary | ICD-10-CM | POA: Diagnosis not present

## 2023-06-16 DIAGNOSIS — N179 Acute kidney failure, unspecified: Secondary | ICD-10-CM | POA: Insufficient documentation

## 2023-06-16 DIAGNOSIS — E1165 Type 2 diabetes mellitus with hyperglycemia: Secondary | ICD-10-CM | POA: Diagnosis not present

## 2023-06-16 DIAGNOSIS — I10 Essential (primary) hypertension: Secondary | ICD-10-CM | POA: Diagnosis present

## 2023-06-16 DIAGNOSIS — Z87891 Personal history of nicotine dependence: Secondary | ICD-10-CM | POA: Diagnosis not present

## 2023-06-16 DIAGNOSIS — E039 Hypothyroidism, unspecified: Secondary | ICD-10-CM | POA: Diagnosis present

## 2023-06-16 DIAGNOSIS — R739 Hyperglycemia, unspecified: Principal | ICD-10-CM

## 2023-06-16 DIAGNOSIS — R4182 Altered mental status, unspecified: Secondary | ICD-10-CM | POA: Diagnosis present

## 2023-06-16 LAB — URINALYSIS, ROUTINE W REFLEX MICROSCOPIC
Bilirubin Urine: NEGATIVE
Glucose, UA: >=500 mg/dL — AB
Hgb urine dipstick: NEGATIVE
Ketones, ur: NEGATIVE mg/dL
Leukocytes,Ua: NEGATIVE
Nitrite: NEGATIVE
Protein, ur: 30 mg/dL — AB
Specific Gravity, Urine: 1.016 (ref 1.005–1.030)
pH: 7 (ref 5.0–8.0)

## 2023-06-16 LAB — BASIC METABOLIC PANEL
Anion gap: 16 — ABNORMAL HIGH (ref 5–15)
Anion gap: 10 (ref 5–15)
BUN: 24 mg/dL — ABNORMAL HIGH (ref 8–23)
BUN: 21 mg/dL (ref 8–23)
CO2: 19 mmol/L — ABNORMAL LOW (ref 22–32)
CO2: 25 mmol/L (ref 22–32)
Calcium: 9.6 mg/dL (ref 8.9–10.3)
Calcium: 8.7 mg/dL — ABNORMAL LOW (ref 8.9–10.3)
Chloride: 103 mmol/L (ref 98–111)
Chloride: 105 mmol/L (ref 98–111)
Creatinine, Ser: 1.09 mg/dL — ABNORMAL HIGH (ref 0.44–1.00)
Creatinine, Ser: 0.87 mg/dL (ref 0.44–1.00)
GFR, Estimated: 48 mL/min — ABNORMAL LOW (ref >60)
GFR, Estimated: >60 mL/min (ref >60)
Glucose, Bld: 511 mg/dL (ref 70–99)
Glucose, Bld: 160 mg/dL — ABNORMAL HIGH (ref 70–99)
Potassium: 3.4 mmol/L — ABNORMAL LOW (ref 3.5–5.1)
Potassium: 3 mmol/L — ABNORMAL LOW (ref 3.5–5.1)
Sodium: 138 mmol/L (ref 135–145)
Sodium: 140 mmol/L (ref 135–145)

## 2023-06-16 LAB — HEPATIC FUNCTION PANEL
ALT: 39 U/L (ref 0–44)
AST: 46 U/L — ABNORMAL HIGH (ref 15–41)
Albumin: 4 g/dL (ref 3.5–5.0)
Alkaline Phosphatase: 110 U/L (ref 38–126)
Bilirubin, Direct: 0.2 mg/dL (ref 0.0–0.2)
Indirect Bilirubin: 0.6 mg/dL (ref 0.3–0.9)
Total Bilirubin: 0.8 mg/dL (ref <1.2)
Total Protein: 7.2 g/dL (ref 6.5–8.1)

## 2023-06-16 LAB — BLOOD GAS, VENOUS
Acid-Base Excess: 5.2 mmol/L — ABNORMAL HIGH (ref 0.0–2.0)
Bicarbonate: 29 mmol/L — ABNORMAL HIGH (ref 20.0–28.0)
O2 Saturation: 83 %
Patient temperature: 37
pCO2, Ven: 39 mm[Hg] — ABNORMAL LOW (ref 44–60)
pH, Ven: 7.48 — ABNORMAL HIGH (ref 7.25–7.43)
pO2, Ven: 49 mm[Hg] — ABNORMAL HIGH (ref 32–45)

## 2023-06-16 LAB — CBC
HCT: 44.4 % (ref 36.0–46.0)
Hemoglobin: 13.4 g/dL (ref 12.0–15.0)
MCH: 29 pg (ref 26.0–34.0)
MCHC: 30.2 g/dL (ref 30.0–36.0)
MCV: 96.1 fL (ref 80.0–100.0)
Platelets: 219 10*3/uL (ref 150–400)
RBC: 4.62 MIL/uL (ref 3.87–5.11)
RDW: 14.6 % (ref 11.5–15.5)
WBC: 8.1 10*3/uL (ref 4.0–10.5)
nRBC: 0.4 % — ABNORMAL HIGH (ref 0.0–0.2)

## 2023-06-16 LAB — CBG MONITORING, ED
Glucose-Capillary: 512 mg/dL (ref 70–99)
Glucose-Capillary: 403 mg/dL — ABNORMAL HIGH (ref 70–99)
Glucose-Capillary: 389 mg/dL — ABNORMAL HIGH (ref 70–99)
Glucose-Capillary: 227 mg/dL — ABNORMAL HIGH (ref 70–99)
Glucose-Capillary: 156 mg/dL — ABNORMAL HIGH (ref 70–99)
Glucose-Capillary: 125 mg/dL — ABNORMAL HIGH (ref 70–99)

## 2023-06-16 LAB — BETA-HYDROXYBUTYRIC ACID: Beta-Hydroxybutyric Acid: 0.19 mmol/L (ref 0.05–0.27)

## 2023-06-16 MED ORDER — HYDRALAZINE HCL 50 MG PO TABS
50.0000 mg | ORAL_TABLET | Freq: Two times a day (BID) | ORAL | Status: DC
  Administered 2023-06-16 – 2023-06-17 (×2): 50 mg via ORAL
  Filled 2023-06-16 (×2): qty 1

## 2023-06-16 MED ORDER — POTASSIUM CHLORIDE 10 MEQ/100ML IV SOLN
10.0000 meq | INTRAVENOUS | Status: AC
  Administered 2023-06-16 (×4): 10 meq via INTRAVENOUS
  Filled 2023-06-16 (×2): qty 100

## 2023-06-16 MED ORDER — INSULIN REGULAR(HUMAN) IN NACL 100-0.9 UT/100ML-% IV SOLN
INTRAVENOUS | Status: DC
  Administered 2023-06-16: 9 [IU]/h via INTRAVENOUS
  Filled 2023-06-16: qty 100

## 2023-06-16 MED ORDER — INSULIN GLARGINE-YFGN 100 UNIT/ML ~~LOC~~ SOLN
10.0000 [IU] | Freq: Every day | SUBCUTANEOUS | Status: DC
  Administered 2023-06-16: 10 [IU] via SUBCUTANEOUS
  Filled 2023-06-16: qty 0.1

## 2023-06-16 MED ORDER — METOPROLOL SUCCINATE ER 50 MG PO TB24
50.0000 mg | ORAL_TABLET | Freq: Every day | ORAL | Status: DC
  Administered 2023-06-17: 50 mg via ORAL
  Filled 2023-06-16: qty 1

## 2023-06-16 MED ORDER — INSULIN ASPART 100 UNIT/ML IJ SOLN: 0.0000 [IU] | Freq: Every day | INTRAMUSCULAR | Status: DC

## 2023-06-16 MED ORDER — LEVOTHYROXINE SODIUM 88 MCG PO TABS
88.0000 ug | ORAL_TABLET | Freq: Every day | ORAL | Status: DC
  Administered 2023-06-17: 88 ug via ORAL
  Filled 2023-06-16: qty 1

## 2023-06-16 MED ORDER — INSULIN ASPART 100 UNIT/ML IJ SOLN: 0.0000 [IU] | Freq: Three times a day (TID) | INTRAMUSCULAR | Status: DC

## 2023-06-16 MED ORDER — DEXTROSE 50 % IV SOLN: 0.0000 mL | INTRAVENOUS | Status: DC | PRN

## 2023-06-16 MED ORDER — DEXTROSE-SODIUM CHLORIDE 5-0.45 % IV SOLN: INTRAVENOUS | Status: DC

## 2023-06-16 MED ORDER — POTASSIUM CHLORIDE 10 MEQ/100ML IV SOLN
10.0000 meq | INTRAVENOUS | Status: AC
  Administered 2023-06-16 – 2023-06-17 (×2): 10 meq via INTRAVENOUS
  Filled 2023-06-16: qty 100

## 2023-06-16 MED ORDER — AMLODIPINE BESYLATE 5 MG PO TABS
5.0000 mg | ORAL_TABLET | Freq: Every day | ORAL | Status: DC
  Administered 2023-06-16 – 2023-06-17 (×2): 5 mg via ORAL
  Filled 2023-06-16 (×2): qty 1

## 2023-06-16 MED ORDER — SODIUM CHLORIDE 0.9 % IV SOLN: INTRAVENOUS | Status: DC

## 2023-06-16 MED ORDER — SODIUM CHLORIDE 0.9 % IV BOLUS
1000.0000 mL | Freq: Once | INTRAVENOUS | Status: AC
  Administered 2023-06-16: 1000 mL via INTRAVENOUS

## 2023-06-16 MED ORDER — APIXABAN 5 MG PO TABS
5.0000 mg | ORAL_TABLET | Freq: Two times a day (BID) | ORAL | Status: DC
  Administered 2023-06-16 – 2023-06-17 (×2): 5 mg via ORAL
  Filled 2023-06-16 (×2): qty 1

## 2023-06-16 NOTE — H&P (Signed)
History and Physical    Patient: Barbara Butler AOZ:[id number] DOB: Oct 13, 1929 DOA: 06/16/2023 DOS: the patient was seen and examined on 06/16/2023 PCP: [name], MD  Patient coming from: Home  Chief Complaint:  Chief Complaint  Patient presents with   Hyperglycemia    HPI: Barbara Butler is a 89 y.o. female with medical history significant for DM, HTN, hypothyroidism, CVA 01/2023, and afib on Eliquis, brought in by EMS from home with elevated blood sugars and altered mental status.  CBG with EMS was 600 .history provided by daughter [name] over the phone who says that for the past 4 days patient has been very sluggish, drifting off to sleep easily and this is not usual for her.  She states that she often gets sweets from relatives cooking for her but this is not unusual, but her diabetes medication was adjusted about a month ago.  She has had no vomiting abdominal pain or diarrhea, cough or shortness of breath. ED course and data review:BP 162/92 with otherwise normal vitals Labs notable for the following: Blood glucose 511 with anion gap 16, beta hydroxybutyric acid 0.19 and venous pH 7.48.  Serum bicarb 19. Creatinine 1.09 up from baseline of 0.84 couple months prior CBC unremarkable Urinalysis unremarkable EKG, personally viewed and interpreted showing A-fib at 90 with RBBB CT head nonacute Chest x-ray not done Patient was started on an insulin drip Hospitalist consulted for admission     Past Medical History:  Diagnosis Date   Anemia    Atrial fibrillation (HCC)    Diabetes mellitus without complication (HCC)    diet controlled   Diabetic neuropathy (HCC)    Dysrhythmia    Hypertension    Hypothyroidism    Stroke [facility])    Past Surgical History:  Procedure Laterality Date   CATARACT EXTRACTION W/PHACO Right 01/28/2019   Procedure: CATARACT EXTRACTION PHACO AND INTRAOCULAR LENS PLACEMENT (IOC) RIGHT DIABETES;  Surgeon: Elliot Cousin, MD;  Location:  [facility] ORS;  Service: Ophthalmology;  Laterality: Right;  [place] 00:49.3 CDE 8.12 Fluid Pack Lot # [id number]   CATARACT EXTRACTION W/PHACO Left 02/25/2019   Procedure: CATARACT EXTRACTION PHACO AND INTRAOCULAR LENS PLACEMENT (IOC), LEFT, DIABETIC, VISION BLUE;  Surgeon: Elliot Cousin, MD;  Location: [facility] ORS;  Service: Ophthalmology;  Laterality: Left;  [place]  01:10 CDE 11.43 Fluid pack lot # [id number]   FRACTURE SURGERY     INTRAMEDULLARY (IM) NAIL INTERTROCHANTERIC Right 02/06/2023   Procedure: INTRAMEDULLARY NAILING OF RIGHT FEMUR;  Surgeon: Roby Lofts, MD;  Location: [facility] OR;  Service: Orthopedics;  Laterality: Right;   ORIF WRIST FRACTURE Right 06/28/2016   Procedure: OPEN REDUCTION INTERNAL FIXATION (ORIF) WRIST FRACTURE;  Surgeon: Kennedy Bucker, MD;  Location: [facility] ORS;  Service: Orthopedics;  Laterality: Right;   SALPINGECTOMY     Social History:  reports that she quit smoking about 64 years ago. Her smoking use included cigarettes. She has never used smokeless tobacco. She reports that she does not currently use alcohol. She reports that she does not use drugs.  No Known Allergies  Family History  Problem Relation Age of Onset   Breast cancer Neg Hx     Prior to Admission medications   Medication Sig Start Date End Date Taking? Authorizing Provider  acetaminophen (TYLENOL) 325 MG tablet Take 1-2 tablets (325-650 mg total) by mouth every 6 (six) hours as needed for mild pain (pain score 1-3 or temp > 100.5). Patient not taking: Reported on 05/27/2023 02/11/23   Ramiro Harvest  V, MD  amLODipine (NORVASC) 5 MG tablet Take 1 tablet (5 mg total) by mouth daily. 05/27/23   Furth, Cadence H, PA-C  apixaban (ELIQUIS) 5 MG TABS tablet Take 1 tablet (5 mg total) by mouth 2 (two) times daily. 05/27/23   Furth, Cadence H, PA-C  Ascorbic Acid (VITAMIN C) 1000 MG tablet Take 1,000 mg by mouth daily at 12 noon. Patient not taking: Reported on 05/27/2023    [provider]  cholecalciferol  (VITAMIN D3) 25 MCG (1000 UT) tablet Take 1,000 Units by mouth daily at 12 noon. Patient not taking: Reported on 05/27/2023    [provider]  feeding supplement (ENSURE ENLIVE / ENSURE PLUS) LIQD Take 237 mLs by mouth daily. 02/11/23   Rodolph Bong, MD  furosemide (LASIX) 20 MG tablet Take 2 tablets (40 mg total) by mouth daily. 06/13/23   Furth, Cadence H, PA-C  hydrALAZINE (APRESOLINE) 50 MG tablet Take 1 tablet (50 mg total) by mouth 2 (two) times daily. 05/27/23   Furth, Cadence H, PA-C  JANUVIA 100 MG tablet Take 100 mg by mouth daily. 05/22/23 11/18/23  [provider]  levothyroxine (SYNTHROID, LEVOTHROID) 88 MCG tablet Take 88 mcg by mouth daily.  12/05/15   [provider]  metFORMIN (GLUCOPHAGE) 500 MG tablet Take 500 mg by mouth 2 (two) times daily. Patient not taking: Reported on 05/27/2023    [provider]  methocarbamol (ROBAXIN) 500 MG tablet Take 1 tablet (500 mg total) by mouth every 6 (six) hours as needed for muscle spasms. Patient not taking: Reported on 05/27/2023 02/11/23   Rodolph Bong, MD  metoprolol succinate (TOPROL-XL) 50 MG 24 hr tablet Take 1 tablet (50 mg total) by mouth daily. Take with or immediately following a meal. 05/27/23 08/25/23  Furth, Cadence H, PA-C  Multiple Vitamins-Minerals (CENTRUM SILVER 50+WOMEN PO) Take 1 tablet by mouth daily.    [provider]  polyethylene glycol (MIRALAX / GLYCOLAX) 17 g packet Take 17 g by mouth daily as needed for mild constipation. Patient not taking: Reported on 05/27/2023 02/10/23   Rodolph Bong, MD  saccharomyces boulardii (FLORASTOR) 250 MG capsule Take 250 mg by mouth 2 (two) times daily. Patient not taking: Reported on 05/27/2023    [provider]    Physical Exam: Vitals:   06/16/23 1610 06/16/23 1611 06/16/23 1800 06/16/23 2000  BP:  (!) 165/132 (!) 162/92 (!) 157/87  Pulse: 89  77 74  Resp: 18  16 (!) 21  Temp: 98.5 F (36.9 C)     TempSrc:  Oral     SpO2: 93%  97% 100%  Weight: 59.9 kg     Height: 5\' 4"  (1.626 m)      Physical Exam Vitals and nursing note reviewed.  Constitutional:      General: She is not in acute distress. HENT:     Head: Normocephalic and atraumatic.  Cardiovascular:     Rate and Rhythm: Normal rate and regular rhythm.     Heart sounds: Normal heart sounds.  Pulmonary:     Effort: Pulmonary effort is normal.     Breath sounds: Normal breath sounds.  Abdominal:     Palpations: Abdomen is soft.     Tenderness: There is no abdominal tenderness.  Neurological:     Mental Status: Mental status is at baseline. She is disoriented.     Labs on Admission: I have personally reviewed following labs and imaging studies  CBC: Recent Labs  Lab  06/16/23 1619  WBC 8.1  HGB 13.4  HCT 44.4  MCV 96.1  PLT 219   Basic Metabolic Panel: Recent Labs  Lab 06/16/23 1619  NA 138  K 3.4*  CL 103  CO2 19*  GLUCOSE 511*  BUN 24*  CREATININE 1.09*  CALCIUM 9.6   GFR: Estimated Creatinine Clearance: 29 mL/min (A) (by C-G formula based on SCr of 1.09 mg/dL (H)). Liver Function Tests: Recent Labs  Lab 06/16/23 1619  AST 46*  ALT 39  ALKPHOS 110  BILITOT 0.8  PROT 7.2  ALBUMIN 4.0   No results for input(s): "LIPASE", "AMYLASE" in the last 168 hours. No results for input(s): "AMMONIA" in the last 168 hours. Coagulation Profile: No results for input(s): "INR", "PROTIME" in the last 168 hours. Cardiac Enzymes: No results for input(s): "CKTOTAL", "CKMB", "CKMBINDEX", "TROPONINI" in the last 168 hours. BNP (last 3 results) No results for input(s): "PROBNP" in the last 8760 hours. HbA1C: No results for input(s): "HGBA1C" in the last 72 hours. CBG: Recent Labs  Lab 06/16/23 1608 06/16/23 1923 06/16/23 2001 06/16/23 2105  GLUCAP 512* 403* 389* 227*   Lipid Profile: No results for input(s): "CHOL", "HDL", "LDLCALC", "TRIG", "CHOLHDL", "LDLDIRECT" in the last 72 hours. Thyroid Function  Tests: No results for input(s): "TSH", "T4TOTAL", "FREET4", "T3FREE", "THYROIDAB" in the last 72 hours. Anemia Panel: No results for input(s): "VITAMINB12", "FOLATE", "FERRITIN", "TIBC", "IRON", "RETICCTPCT" in the last 72 hours. Urine analysis:    Component Value Date/Time   COLORURINE YELLOW (A) 06/16/2023 1612   APPEARANCEUR CLEAR (A) 06/16/2023 1612   LABSPEC 1.016 06/16/2023 1612   PHURINE 7.0 06/16/2023 1612   GLUCOSEU >=500 (A) 06/16/2023 1612   HGBUR NEGATIVE 06/16/2023 1612   BILIRUBINUR NEGATIVE 06/16/2023 1612   KETONESUR NEGATIVE 06/16/2023 1612   PROTEINUR 30 (A) 06/16/2023 1612   NITRITE NEGATIVE 06/16/2023 1612   LEUKOCYTESUR NEGATIVE 06/16/2023 1612    Radiological Exams on Admission: CT Head Wo Contrast  Result Date: 06/16/2023 CLINICAL DATA:  Mental status change, unknown cause EXAM: CT HEAD WITHOUT CONTRAST TECHNIQUE: Contiguous axial images were obtained from the base of the skull through the vertex without intravenous contrast. RADIATION DOSE REDUCTION: This exam was performed according to the departmental dose-optimization program which includes automated exposure control, adjustment of the mA and/or kV according to patient size and/or use of iterative reconstruction technique. COMPARISON:  CT head 02/05/2023. FINDINGS: Brain: No evidence of acute infarction, hemorrhage, hydrocephalus, extra-axial collection or mass lesion/mass effect. Patchy white matter hypodensities, nonspecific but compatible with chronic microvascular ischemic disease. Cerebral atrophy. Vascular: No hyperdense vessel. Skull: No acute fracture. Sinuses/Orbits: Clear sinuses.  No acute orbital findings. Other: No mastoid effusions. IMPRESSION: No evidence of acute intracranial abnormality. Electronically Signed   By: Feliberto Harts M.D.   On: 06/16/2023 19:52     Data Reviewed: Relevant notes from primary care and specialist visits, past discharge summaries as available in EHR, including Care  Everywhere. Prior diagnostic testing as pertinent to current admission diagnoses Updated medications and problem lists for reconciliation ED course, including vitals, labs, imaging, treatment and response to treatment Triage notes, nursing and pharmacy notes and ED provider's notes Notable results as noted in HPI   Assessment and Plan: * Type 2 diabetes mellitus with hyperglycemia, without long-term current use of insulin (HCC) Not meeting criteria for DKA, probably borderline given anion gap metabolic acidosis Continue insulin infusion Blood sugar improving postadmission so will plan to transition to subcu Follow A1c  Acute metabolic encephalopathy Likely  secondary to hyperglycemia No evidence of infection Neurologic checks Fall and asked pression precautions  History of CVA (cerebrovascular accident) Head CT nonacute.  Neuroexam nonfocal Continue Eliquis.  Not on statin  AKI (acute kidney injury) (HCC) Secondary to ATN from dehydration related to hyperglycemia Expecting improvement with IV hydration Continue to monitor and avoid nephrotoxins  Atrial fibrillation/flutter (HCC) Continue Eliquis  Hypothyroidism Continue levothyroxine  Hypertension Continue amlodipine, metoprolol and hydralazine    DVT prophylaxis: Lovenox  Consults: none  Advance Care Planning:   Code Status: Prior   Family Communication: daughter [name]  Disposition Plan: Back to previous home environment  Severity of Illness: The appropriate patient status for this patient is INPATIENT. Inpatient status is judged to be reasonable and necessary in order to provide the required intensity of service to ensure the patient's safety. The patient's presenting symptoms, physical exam findings, and initial radiographic and laboratory data in the context of their chronic comorbidities is felt to place them at high risk for further clinical deterioration. Furthermore, it is not anticipated that the  patient will be medically stable for discharge from the hospital within 2 midnights of admission.   * I certify that at the point of admission it is my clinical judgment that the patient will require inpatient hospital care spanning beyond 2 midnights from the point of admission due to high intensity of service, high risk for further deterioration and high frequency of surveillance required.*  Author: Andris Baumann, MD 06/16/2023 9:31 PM  For on call review [email or web address].

## 2023-06-16 NOTE — IPAL (Signed)
  Interdisciplinary Goals of Care Family Meeting   Date carried out: 06/16/2023  Location of the meeting: Phone conference  Member's involved: Physician and Family Member or next of kin  Durable Power of Attorney or acting medical decision maker: [name]  Discussion: We discussed goals of care for [employer] .   I have reviewed medical records including EPIC notes, labs and imaging, assessed the patient and then met with daughter to discuss major active diagnoses, plan of care, natural trajectory, prognosis, GOC, EOL wishes, disposition and options including Full code/DNI/DNR and the concept of comfort care if DNR is elected. Questions and concerns were addressed. They are  in agreement to continue current plan of care . Election for full code status.   Code status:   Code Status: Prior   Disposition: Continue current acute care  Time spent for the meeting: 18    Andris Baumann, MD  06/16/2023, 9:33 PM

## 2023-06-16 NOTE — Assessment & Plan Note (Signed)
-   Continue Eliquis 

## 2023-06-16 NOTE — ED Provider Notes (Signed)
[facility] Provider Note    Event Date/Time   First MD Initiated Contact with Patient 06/16/23 1732     (approximate)   History   Chief Complaint Hyperglycemia   HPI  Barbara Butler is a 89 y.o. female with past medical history of hypertension, diabetes, stroke, and atrial fibrillation on Eliquis who presents to the ED for hyperglycemia.  Daughter at bedside reports that patient has been weaker and more disoriented than usual for about the past 4 to 5 days.  Family states that patient was taken off of metformin and changed to Januvia 1 month ago, blood sugars were initially running in the 200s until family checked it today and found it to be greater than 600.  Patient currently denies any complaints, but family state she has been disoriented.     Physical Exam   Triage Vital Signs: ED Triage Vitals  Encounter Vitals Group     BP 06/16/23 1611 (!) 165/132     Systolic BP Percentile --      Diastolic BP Percentile --      Pulse Rate 06/16/23 1610 89     Resp 06/16/23 1610 18     Temp 06/16/23 1610 98.5 F (36.9 C)     Temp Source 06/16/23 1610 Oral     SpO2 06/16/23 1610 93 %     Weight 06/16/23 1610 132 lb (59.9 kg)     Height 06/16/23 1610 5\' 4"  (1.626 m)     Head Circumference --      Peak Flow --      Pain Score 06/16/23 1610 0     Pain Loc --      Pain Education --      Exclude from Growth Chart --     Most recent vital signs: Vitals:   06/16/23 1800 06/16/23 2000  BP: (!) 162/92 (!) 157/87  Pulse: 77 74  Resp: 16 (!) 21  Temp:    SpO2: 97% 100%    Constitutional: Alert and oriented to person and place, but not time or situation. Eyes: Conjunctivae are normal. Head: Atraumatic. Nose: No congestion/rhinnorhea. Mouth/Throat: Mucous membranes are moist.  Cardiovascular: Normal rate, regular rhythm. Grossly normal heart sounds.  2+ radial pulses bilaterally. Respiratory: Normal respiratory effort.  No retractions. Lungs  CTAB. Gastrointestinal: Soft and nontender. No distention. Musculoskeletal: No lower extremity tenderness nor edema.  Neurologic:  Normal speech and language. No gross focal neurologic deficits are appreciated.    ED Results / Procedures / Treatments   Labs (all labs ordered are listed, but only abnormal results are displayed) Labs Reviewed  BASIC METABOLIC PANEL - Abnormal; Notable for the following components:      Result Value   Potassium 3.4 (*)    CO2 19 (*)    Glucose, Bld 511 (*)    BUN 24 (*)    Creatinine, Ser 1.09 (*)    GFR, Estimated 48 (*)    Anion gap 16 (*)    All other components within normal limits  CBC - Abnormal; Notable for the following components:   nRBC 0.4 (*)    All other components within normal limits  URINALYSIS, ROUTINE W REFLEX MICROSCOPIC - Abnormal; Notable for the following components:   Color, Urine YELLOW (*)    APPearance CLEAR (*)    Glucose, UA >=500 (*)    Protein, ur 30 (*)    Bacteria, UA RARE (*)    All other components within normal limits  BLOOD GAS, VENOUS - Abnormal; Notable for the following components:   pH, Ven 7.48 (*)    pCO2, Ven 39 (*)    pO2, Ven 49 (*)    Bicarbonate 29.0 (*)    Acid-Base Excess 5.2 (*)    All other components within normal limits  HEPATIC FUNCTION PANEL - Abnormal; Notable for the following components:   AST 46 (*)    All other components within normal limits  CBG MONITORING, ED - Abnormal; Notable for the following components:   Glucose-Capillary 512 (*)    All other components within normal limits  CBG MONITORING, ED - Abnormal; Notable for the following components:   Glucose-Capillary 403 (*)    All other components within normal limits  CBG MONITORING, ED - Abnormal; Notable for the following components:   Glucose-Capillary 389 (*)    All other components within normal limits  BETA-HYDROXYBUTYRIC ACID  CBG MONITORING, ED     EKG  ED ECG REPORT I, Chesley Noon, the attending  physician, personally viewed and interpreted this ECG.   Date: 06/16/2023  EKG Time: 19:21  Rate: 90  Rhythm: normal sinus rhythm  Axis: Normal  Intervals:right bundle branch block  ST&T Change: None  RADIOLOGY CT head reviewed and interpreted by me with no hemorrhage or midline shift.  PROCEDURES:  Critical Care performed: Yes, see critical care procedure note(s)  .Critical Care  Performed by: Chesley Noon, MD Authorized by: Chesley Noon, MD   Critical care provider statement:    Critical care time (minutes):  30   Critical care time was exclusive of:  Separately billable procedures and treating other patients and teaching time   Critical care was necessary to treat or prevent imminent or life-threatening deterioration of the following conditions:  Endocrine crisis and metabolic crisis   Critical care was time spent personally by me on the following activities:  Development of treatment plan with patient or surrogate, discussions with consultants, evaluation of patient's response to treatment, examination of patient, ordering and review of laboratory studies, ordering and review of radiographic studies, ordering and performing treatments and interventions, pulse oximetry, re-evaluation of patient's condition and review of old charts   I assumed direction of critical care for this patient from another provider in my specialty: no     Care discussed with: admitting provider      MEDICATIONS ORDERED IN ED: Medications  insulin regular, human (MYXREDLIN) 100 units/ 100 mL infusion (10 Units/hr Intravenous Rate/Dose Change 06/16/23 2001)  dextrose 50 % solution 0-50 mL (has no administration in time range)  potassium chloride 10 mEq in 100 mL IVPB (10 mEq Intravenous New Bag/Given 06/16/23 2021)  0.9 %  sodium chloride infusion ( Intravenous New Bag/Given 06/16/23 1937)  dextrose 5 % and 0.45 % NaCl infusion (0 mLs Intravenous Hold 06/16/23 1930)  sodium chloride 0.9 % bolus 1,000  mL (0 mLs Intravenous Stopped 06/16/23 1925)     IMPRESSION / MDM / ASSESSMENT AND PLAN / ED COURSE  I reviewed the triage vital signs and the nursing notes.                              89 y.o. female with past medical history of hypertension, diabetes, stroke, and atrial fibrillation on Eliquis who presents to the ED for increasing weakness and confusion for the past 4 to 5 days, noted to be hyperglycemic at home today.  Patient's presentation is most consistent with acute  presentation with potential threat to life or bodily function.  Differential diagnosis includes, but is not limited to, HHS, DKA, stroke, TIA, UTI, electrolyte abnormality, AKI.  Patient nontoxic appearing and in no acute distress, vital signs are unremarkable.  She is A&O x 2, which family state is a departure from her baseline, does not appear to have any focal neurologic deficits.  We will check CT head, but would consider HHS versus DKA contributing to her change in mental status.  She does have mild increase in anion gap along with mild acidosis associated with hyperglycemia and we will start on IV insulin drip.  Mild AKI noted without acute electrolyte abnormality, no significant anemia or leukocytosis.  Urinalysis does not appear concerning for UTI, no symptoms to suggest pneumonia.  CT head is negative for acute process, beta hydroxybutyrate within normal limits and VBG is reassuring, DKA seems less likely at this time.  Case discussed with hospitalist for admission.      FINAL CLINICAL IMPRESSION(S) / ED DIAGNOSES   Final diagnoses:  Hyperglycemia  Altered mental status, unspecified altered mental status type     Rx / DC Orders   ED Discharge Orders     None        Note:  This document was prepared using Dragon voice recognition software and may include unintentional dictation errors.   Chesley Noon, MD 06/16/23 2036

## 2023-06-16 NOTE — Assessment & Plan Note (Signed)
Continue amlodipine, metoprolol and hydralazine.

## 2023-06-16 NOTE — Assessment & Plan Note (Addendum)
Head CT nonacute.  Neuroexam nonfocal Continue Eliquis.  Not on statin

## 2023-06-16 NOTE — ED Triage Notes (Signed)
Pt comes via EMS from home with c/o hyperglycemia. Pt CBG 600 after reading of HIGH. Pt does have diabetes. Pt had appt month ago with doctor to take off metformin.   Pt doesn't check sugar on regular schedule.  Pt also on lasix and thinner.

## 2023-06-16 NOTE — Assessment & Plan Note (Addendum)
Not meeting criteria for DKA, probably borderline given anion gap metabolic acidosis Continue insulin infusion Blood sugar improving postadmission so will plan to transition to subcu Follow A1c

## 2023-06-16 NOTE — Assessment & Plan Note (Signed)
Secondary to ATN from dehydration related to hyperglycemia Expecting improvement with IV hydration Continue to monitor and avoid nephrotoxins

## 2023-06-16 NOTE — ED Notes (Signed)
MD Larinda Buttery informed of pt glucose of 511

## 2023-06-16 NOTE — Assessment & Plan Note (Addendum)
Likely secondary to hyperglycemia No evidence of infection Neurologic checks Fall and asked pression precautions

## 2023-06-16 NOTE — Assessment & Plan Note (Signed)
Continue levothyroxine 

## 2023-06-16 NOTE — ED Triage Notes (Signed)
Patient to ED via EMS from home for hyperglycemia. States cbg greater than 600 at home. Takes Januvia- taken off metformin approx 1 month ago. CBG 512 in triage.

## 2023-06-17 DIAGNOSIS — E1165 Type 2 diabetes mellitus with hyperglycemia: Principal | ICD-10-CM

## 2023-06-17 LAB — BASIC METABOLIC PANEL
Anion gap: 6 (ref 5–15)
BUN: 19 mg/dL (ref 8–23)
CO2: 26 mmol/L (ref 22–32)
Calcium: 8.5 mg/dL — ABNORMAL LOW (ref 8.9–10.3)
Chloride: 106 mmol/L (ref 98–111)
Creatinine, Ser: 0.76 mg/dL (ref 0.44–1.00)
GFR, Estimated: >60 mL/min (ref >60)
Glucose, Bld: 120 mg/dL — ABNORMAL HIGH (ref 70–99)
Potassium: 3.8 mmol/L (ref 3.5–5.1)
Sodium: 138 mmol/L (ref 135–145)

## 2023-06-17 LAB — CBG MONITORING, ED
Glucose-Capillary: 112 mg/dL — ABNORMAL HIGH (ref 70–99)
Glucose-Capillary: 113 mg/dL — ABNORMAL HIGH (ref 70–99)
Glucose-Capillary: 98 mg/dL (ref 70–99)

## 2023-06-17 MED ORDER — METFORMIN HCL ER (OSM) 500 MG PO TB24: 500.0000 mg | ORAL_TABLET | Freq: Two times a day (BID) | ORAL | 1 refills | Status: DC

## 2023-06-17 MED ORDER — METFORMIN HCL 500 MG PO TABS
500.0000 mg | ORAL_TABLET | Freq: Two times a day (BID) | ORAL | 5 refills | Status: DC
Start: 2023-06-17 — End: 2024-05-12

## 2023-06-17 NOTE — ED Notes (Signed)
Patient's daughter Mardene Celeste called ED, phone given to patient.

## 2023-06-17 NOTE — Discharge Summary (Addendum)
Barbara Butler VWU:[id number] DOB: [birth year] DOA: 06/16/2023  PCP: [name], MD  Admit date: 06/16/2023 Discharge date: 06/17/2023  Time spent: 35 minutes  Recommendations for Outpatient Follow-up:  Close PCP f/u     Discharge Diagnoses:  Principal Problem:   Type 2 diabetes mellitus with hyperglycemia, without long-term current use of insulin (HCC) Active Problems:   Acute metabolic encephalopathy   History of CVA (cerebrovascular accident)   Hypertension   Hypothyroidism   Atrial fibrillation/flutter (HCC)   AKI (acute kidney injury) (HCC)   Hyperglycemia due to type 2 diabetes mellitus (HCC)   Discharge Condition: improved  Diet recommendation: carb modified  Filed Weights   06/16/23 1610  Weight: 59.9 kg    History of present illness:  From admission h and p Barbara Butler is a 89 y.o. female with medical history significant for DM, HTN, hypothyroidism, CVA 01/2023, and afib on Eliquis, brought in by EMS from home with elevated blood sugars and altered mental status.  CBG with EMS was 600 .history provided by daughter Barbara Butler over the phone who says that for the past 4 days patient has been very sluggish, drifting off to sleep easily and this is not usual for her.  She states that she often gets sweets from relatives cooking for her but this is not unusual, but her diabetes medication was adjusted about a month ago.  She has had no vomiting abdominal pain or diarrhea, cough or shortness of breath.   Hospital Course:  Patient presents with several days confusion. Found to have hyperglycemia to the 500s with a mildly elevated gap. No signs/symptoms of infection and w/u failed to identify an infectious etiology. Patient's glucose was previously relatively well controlled with metformin (A1c earlier this year 7.9) but due to GI upset metformin was discontinued a little over a month ago and [place] substituted. Here she was treated with IV insulin which was  quickly transitioned to basal/bolus, as well as IV fluids. Here glucose has normalized and her gap is closed and she is feeling back to her normal self, and her daughter agrees. As metformin appeared to work well in the past and as patient/family are hopeful to avoid insulin, shared decision to resume metformin, and I thought to prescribe the extended release metformin as that is less likely to cause GI upset. However, after prescribing the pharmacy called to say insurance doesn't cover that, so we will resume the former BID metformin, though worth pursuing extended release as an outpatient if feasible. Plan will be close glucose monitoring back on metformin and close PCP f/u.   Procedures: none   Consultations: none  Discharge Exam: Vitals:   06/17/23 0846 06/17/23 0930  BP:  (!) 161/88  Pulse:  76  Resp:  18  Temp: 97.8 F (36.6 C)   SpO2:  100%    General: NAD Cardiovascular: RRR, soft systolic murmur Respiratory: CTAB Abdomen: soft, non-tender Ext: warm, trace edema  Discharge Instructions   Discharge Instructions     Diet Carb Modified   Complete by: As directed    Increase activity slowly   Complete by: As directed       Allergies as of 06/17/2023   No Known Allergies      Medication List     STOP taking these medications    Januvia 100 MG tablet Generic drug: sitaGLIPtin   metFORMIN 500 MG tablet Commonly known as: GLUCOPHAGE Replaced by: metformin 500 MG (OSM) 24 hr tablet  TAKE these medications    acetaminophen 325 MG tablet Commonly known as: TYLENOL Take 1-2 tablets (325-650 mg total) by mouth every 6 (six) hours as needed for mild pain (pain score 1-3 or temp > 100.5).   amLODipine 5 MG tablet Commonly known as: NORVASC Take 1 tablet (5 mg total) by mouth daily.   apixaban 5 MG Tabs tablet Commonly known as: ELIQUIS Take 1 tablet (5 mg total) by mouth 2 (two) times daily.   CENTRUM SILVER 50+WOMEN PO Take 1 tablet by mouth  daily.   cholecalciferol 25 MCG (1000 UNIT) tablet Commonly known as: VITAMIN D3 Take 1,000 Units by mouth daily at 12 noon.   feeding supplement Liqd Take 237 mLs by mouth daily.   furosemide 20 MG tablet Commonly known as: LASIX Take 2 tablets (40 mg total) by mouth daily.   hydrALAZINE 50 MG tablet Commonly known as: APRESOLINE Take 1 tablet (50 mg total) by mouth 2 (two) times daily.   levothyroxine 88 MCG tablet Commonly known as: SYNTHROID Take 88 mcg by mouth daily.   metformin 500 MG (OSM) 24 hr tablet Commonly known as: FORTAMET Take 1 tablet (500 mg total) by mouth 2 (two) times daily with a meal. Replaces: metFORMIN 500 MG tablet   methocarbamol 500 MG tablet Commonly known as: ROBAXIN Take 1 tablet (500 mg total) by mouth every 6 (six) hours as needed for muscle spasms.   metoprolol succinate 50 MG 24 hr tablet Commonly known as: TOPROL-XL Take 1 tablet (50 mg total) by mouth daily. Take with or immediately following a meal.   polyethylene glycol 17 g packet Commonly known as: MIRALAX / GLYCOLAX Take 17 g by mouth daily as needed for mild constipation.   saccharomyces boulardii 250 MG capsule Commonly known as: FLORASTOR Take 250 mg by mouth 2 (two) times daily.   vitamin C 1000 MG tablet Take 1,000 mg by mouth daily at 12 noon.       No Known Allergies  Follow-up Information     [name], MD Follow up.   Specialty: Internal Medicine Why: call to schedule an appointment in about 1 week Contact information: [address] [place] [address] [phone]                  The results of significant diagnostics from this hospitalization (including imaging, microbiology, ancillary and laboratory) are listed below for reference.    Significant Diagnostic Studies: CT Head Wo Contrast  Result Date: 06/16/2023 CLINICAL DATA:  Mental status change, unknown cause EXAM: CT HEAD WITHOUT CONTRAST  TECHNIQUE: Contiguous axial images were obtained from the base of the skull through the vertex without intravenous contrast. RADIATION DOSE REDUCTION: This exam was performed according to the departmental dose-optimization program which includes automated exposure control, adjustment of the mA and/or kV according to patient size and/or use of iterative reconstruction technique. COMPARISON:  CT head 02/05/2023. FINDINGS: Brain: No evidence of acute infarction, hemorrhage, hydrocephalus, extra-axial collection or mass lesion/mass effect. Patchy white matter hypodensities, nonspecific but compatible with chronic microvascular ischemic disease. Cerebral atrophy. Vascular: No hyperdense vessel. Skull: No acute fracture. Sinuses/Orbits: Clear sinuses.  No acute orbital findings. Other: No mastoid effusions. IMPRESSION: No evidence of acute intracranial abnormality. Electronically Signed   By: Feliberto Harts M.D.   On: 06/16/2023 19:52    Microbiology: No results found for this or any previous visit (from the past 240 hour(s)).   Labs: Basic Metabolic Panel: Recent Labs  Lab  06/16/23 1619 06/16/23 2219 06/17/23 0427  NA 138 140 138  K 3.4* 3.0* 3.8  CL 103 105 106  CO2 19* 25 26  GLUCOSE 511* 160* 120*  BUN 24* 21 19  CREATININE 1.09* 0.87 0.76  CALCIUM 9.6 8.7* 8.5*   Liver Function Tests: Recent Labs  Lab 06/16/23 1619  AST 46*  ALT 39  ALKPHOS 110  BILITOT 0.8  PROT 7.2  ALBUMIN 4.0   No results for input(s): "LIPASE", "AMYLASE" in the last 168 hours. No results for input(s): "AMMONIA" in the last 168 hours. CBC: Recent Labs  Lab 06/16/23 1619  WBC 8.1  HGB 13.4  HCT 44.4  MCV 96.1  PLT 219   Cardiac Enzymes: No results for input(s): "CKTOTAL", "CKMB", "CKMBINDEX", "TROPONINI" in the last 168 hours. BNP: BNP (last 3 results) No results for input(s): "BNP" in the last 8760 hours.  ProBNP (last 3 results) No results for input(s): "PROBNP" in the last 8760  hours.  CBG: Recent Labs  Lab 06/16/23 2217 06/16/23 2312 06/17/23 0021 06/17/23 0112 06/17/23 0734  GLUCAP 156* 125* 112* 113* 98       Signed:  Silvano Bilis MD.  [facility] 06/17/2023, 10:31 AM

## 2023-06-17 NOTE — Discharge Instructions (Signed)
Check a fasting sugar every morning and as needed if symptoms of elevated sugar are present. If sugar is greater than 250 or if there are concerning symptoms, contact your primary care provider or seek medical attention elsewhere.

## 2023-06-17 NOTE — Care Management Obs Status (Signed)
MEDICARE OBSERVATION STATUS NOTIFICATION   Patient Details  Name: Barbara Butler MRN: [id number] Date of Birth: 08-20-1929   Medicare Observation Status Notification Given:  Yes    Marquita Palms, LCSW 06/17/2023, 10:40 AM

## 2023-06-17 NOTE — Care Management CC44 (Signed)
Condition Code 44 Documentation Completed  Patient Details  Name: Barbara Butler MRN: [id number] Date of Birth: Dec 18, 1929   Condition Code 44 given:  Yes Patient signature on Condition Code 44 notice:  Yes Documentation of 2 MD's agreement:  Yes Code 44 added to claim:  Yes    Marquita Palms, LCSW 06/17/2023, 10:40 AM

## 2023-06-17 NOTE — ED Notes (Signed)
Patient yelling at nursing staff stating "get over here now, you're not doing anything for me." When this RN asked "what may I get for you?" Patient yelled "call my daughter now and get her up here now." Barbara Butler for patient and provided cordless phone for patient to speak with patient.

## 2023-06-17 NOTE — Care Management Important Message (Signed)
Important Message  Patient Details  Name: Barbara Butler MRN: [id number] Date of Birth: December 08, 1929   Important Message Given:        Marquita Palms, LCSW 06/17/2023, 10:40 AM

## 2023-06-17 NOTE — ED Notes (Signed)
Patient's daughter in law at bedside and given update.

## 2023-06-23 ENCOUNTER — Telehealth: Payer: Self-pay | Admitting: Medical

## 2023-06-23 NOTE — Telephone Encounter (Signed)
Pt relative called in to inform Cadence, PA that pt was told to have repeat blood work at the office. She ended up having to go back to the hospital and they did blood work there. She also wanted to inform that pt weight is doing much better since fluid pill was increased. She states has been staying around 131-132.

## 2023-06-28 ENCOUNTER — Emergency Department
Admission: EM | Admit: 2023-06-28 | Discharge: 2023-06-28 | Disposition: A | Discharge status: Home / Self Care | Payer: Medicare PPO | Attending: Emergency Medicine | Admitting: Emergency Medicine

## 2023-06-28 ENCOUNTER — Emergency Department: Payer: Medicare PPO

## 2023-06-28 ENCOUNTER — Other Ambulatory Visit: Payer: Self-pay

## 2023-06-28 ENCOUNTER — Encounter: Payer: Self-pay | Admitting: Pharmacy Technician

## 2023-06-28 DIAGNOSIS — I10 Essential (primary) hypertension: Secondary | ICD-10-CM | POA: Diagnosis not present

## 2023-06-28 DIAGNOSIS — Z7901 Long term (current) use of anticoagulants: Secondary | ICD-10-CM | POA: Insufficient documentation

## 2023-06-28 DIAGNOSIS — E119 Type 2 diabetes mellitus without complications: Secondary | ICD-10-CM | POA: Insufficient documentation

## 2023-06-28 DIAGNOSIS — S4992XA Unspecified injury of left shoulder and upper arm, initial encounter: Secondary | ICD-10-CM | POA: Diagnosis present

## 2023-06-28 DIAGNOSIS — W19XXXA Unspecified fall, initial encounter: Secondary | ICD-10-CM

## 2023-06-28 DIAGNOSIS — S42212A Unspecified displaced fracture of surgical neck of left humerus, initial encounter for closed fracture: Secondary | ICD-10-CM | POA: Diagnosis not present

## 2023-06-28 DIAGNOSIS — Y92002 Bathroom of unspecified non-institutional (private) residence single-family (private) house as the place of occurrence of the external cause: Secondary | ICD-10-CM | POA: Diagnosis not present

## 2023-06-28 DIAGNOSIS — W010XXA Fall on same level from slipping, tripping and stumbling without subsequent striking against object, initial encounter: Secondary | ICD-10-CM | POA: Insufficient documentation

## 2023-06-28 DIAGNOSIS — S0990XA Unspecified injury of head, initial encounter: Secondary | ICD-10-CM | POA: Diagnosis not present

## 2023-06-28 DIAGNOSIS — S42202A Unspecified fracture of upper end of left humerus, initial encounter for closed fracture: Secondary | ICD-10-CM

## 2023-06-28 MED ORDER — OXYCODONE HCL 5 MG PO TABS: 5.0000 mg | ORAL_TABLET | Freq: Three times a day (TID) | ORAL | 0 refills | Status: DC | PRN

## 2023-06-28 MED ORDER — OXYCODONE HCL 5 MG PO TABS
5.0000 mg | ORAL_TABLET | Freq: Once | ORAL | Status: AC
  Administered 2023-06-28: 5 mg via ORAL
  Filled 2023-06-28: qty 1

## 2023-06-28 NOTE — ED Triage Notes (Signed)
Pt in via EMS from home with c/o fall. EMS reports pt was coming pout of the bathroom and fell landing on her left side. Pt c/o pain to left arm. Pulse present. 170/100, HR 90, CBG 194, no LOC and did not hit her head.

## 2023-06-28 NOTE — ED Provider Notes (Signed)
[facility] Provider Note    Event Date/Time   First MD Initiated Contact with Patient 06/28/23 1942     (approximate)   History   Chief Complaint Shoulder Injury   HPI  Barbara Butler is a 89 y.o. female with past medical history of hypertension, diabetes, and atrial fibrillation on Eliquis who presents to the ED complaining of shoulder injury.  Patient reports that just prior to arrival she tripped and fell on the way into her bathroom, falling onto her left shoulder.  She does not think she hit her head and she denies losing consciousness, now complains only of pain around her left shoulder and upper arm.  She denies any pain in her left elbow or wrist, denies any injuries to her right arm or legs.     Physical Exam   Triage Vital Signs: ED Triage Vitals  Encounter Vitals Group     BP 06/28/23 1703 (!) 145/97     Systolic BP Percentile --      Diastolic BP Percentile --      Pulse Rate 06/28/23 1703 (!) 102     Resp 06/28/23 1702 18     Temp 06/28/23 1704 98.2 F (36.8 C)     Temp Source 06/28/23 1702 Oral     SpO2 06/28/23 1702 100 %     Weight --      Height --      Head Circumference --      Peak Flow --      Pain Score 06/28/23 1705 6     Pain Loc --      Pain Education --      Exclude from Growth Chart --     Most recent vital signs: Vitals:   06/28/23 1704 06/28/23 1957  BP:  117/85  Pulse:  (!) 103  Resp:  16  Temp: 98.2 F (36.8 C) 98.3 F (36.8 C)  SpO2:  100%    Constitutional: Alert and oriented. Eyes: Conjunctivae are normal. Head: Atraumatic. Nose: No congestion/rhinnorhea. Mouth/Throat: Mucous membranes are moist.  Neck: No midline cervical spine tenderness to palpation. Cardiovascular: Normal rate, regular rhythm. Grossly normal heart sounds.  2+ radial pulses bilaterally. Respiratory: Normal respiratory effort.  No retractions. Lungs CTAB.  No chest wall tenderness to palpation. Gastrointestinal: Soft and  nontender. No distention. Musculoskeletal: No lower extremity tenderness nor edema.  Diffuse tenderness to left shoulder with no obvious deformity, no tenderness at left elbow or wrist.  No tenderness to palpation of right upper extremity. Neurologic:  Normal speech and language. No gross focal neurologic deficits are appreciated.    ED Results / Procedures / Treatments   Labs (all labs ordered are listed, but only abnormal results are displayed) Labs Reviewed - No data to display  RADIOLOGY CT head reviewed and interpreted by me with no hemorrhage or midline shift.  PROCEDURES:  Critical Care performed: No  Procedures   MEDICATIONS ORDERED IN ED: Medications  oxyCODONE (Oxy IR/ROXICODONE) immediate release tablet 5 mg (5 mg Oral Given 06/28/23 2000)     IMPRESSION / MDM / ASSESSMENT AND PLAN / ED COURSE  I reviewed the triage vital signs and the nursing notes.                              89 y.o. female with past medical history of hypertension, diabetes, and atrial fibrillation on Eliquis who presents to the ED complaining  of left shoulder pain after a fall at home.  Patient's presentation is most consistent with acute complicated illness / injury requiring diagnostic workup.  Differential diagnosis includes, but is not limited to, intracranial injury, cervical spine injury, fracture, dislocation.  Patient nontoxic-appearing and in no acute distress, vital signs are unremarkable.  CT head and cervical spine are negative for acute process, x-ray of left shoulder does show proximal humerus fracture.  She is neurovascularly intact distally and there is no evidence of injury to her trunk or extremities.  Patient placed in sling and appropriate for outpatient follow-up with orthopedics.  She was prescribed short course of pain medication, patient and daughter counseled to have her return to the ED for new or worsening symptoms, patient agrees with plan.      FINAL CLINICAL  IMPRESSION(S) / ED DIAGNOSES   Final diagnoses:  Closed fracture of proximal end of left humerus, unspecified fracture morphology, initial encounter  Fall, initial encounter     Rx / DC Orders   ED Discharge Orders          Ordered    oxyCODONE (ROXICODONE) 5 MG immediate release tablet  Every 8 hours PRN        06/28/23 2013             Note:  This document was prepared using Dragon voice recognition software and may include unintentional dictation errors.   Chesley Noon, MD 06/28/23 2018

## 2023-06-28 NOTE — ED Provider Triage Note (Signed)
Emergency Medicine Provider Triage Evaluation Note  Barbara Butler , a 89 y.o. female  was evaluated in triage.  Pt complains of fall at home and landed on left side.  Review of Systems  Positive: Left shoulder pain Negative:   Physical Exam  Temp 98.2 F (36.8 C) (Oral)   Resp 18  Gen:   Awake, no distress   Resp:  Normal effort  MSK:   Moves extremities without difficulty  Other:  TTP over the left shoulder joint  Medical Decision Making  Medically screening exam initiated at 5:02 PM.  Appropriate orders placed.  [name] was informed that the remainder of the evaluation will be completed by another provider, this initial triage assessment does not replace that evaluation, and the importance of remaining in the ED until their evaluation is complete.    Cameron Ali, PA-C 06/28/23 1705

## 2023-06-28 NOTE — ED Triage Notes (Signed)
Pt here after tripping and falling onto L shoulder when coming out of the bathroom. Denies hitting head, no LOC. CNS intact.

## 2023-06-28 NOTE — ED Notes (Signed)
Pt and pt's daughter verbalize understanding of discharge instructions

## 2023-08-04 ENCOUNTER — Telehealth: Payer: Self-pay | Admitting: Medical

## 2023-08-04 DIAGNOSIS — I4891 Unspecified atrial fibrillation: Secondary | ICD-10-CM

## 2023-08-04 NOTE — Telephone Encounter (Signed)
*  STAT* If patient is at the pharmacy, call can be transferred to refill team.   1. Which medications need to be refilled? (please list name of each medication and dose if known)   apixaban (ELIQUIS) 5 MG TABS tablet   NEW PHARMACY   4. Which pharmacy/location (including street and city if local pharmacy) is medication to be sent to? [facility] - [place], [place] - [address] Phone: [phone]  Fax: [phone]       5. Do they need a 30 day or 90 day supply? 90

## 2023-08-05 NOTE — Telephone Encounter (Addendum)
Eliquis 5mg  refill request received. Patient is 89 years old, weight-59kg, Crea-0.76 on 04/17/23, Diagnosis-Afib/flutter, and last seen by Cadence Furth on 05/27/23. Dose is inappropriate based on dosing criteria.   Last refill sent 05/27/23 per 05/26/23 refill encounter.   Will have PharmD to evaluate dose.

## 2023-08-05 NOTE — Telephone Encounter (Signed)
Refill Request.

## 2023-08-05 NOTE — Telephone Encounter (Signed)
Pavero, Christopher, RPH  You (10:38 AM)    Yes, please reduce dose to 2.5mg  BID    You  Cv Div Pharmd  9:00 AM)    Can you please evaluate eliquis dose? Eliquis dose increased on 05/27/23 per refill encounter    Per message from [facility] PharmD, eliquis dose to be reduced to eliquis 2.5mg  bid. Called pt phone number to update and no answer or voicemail. Called next of kin on DPR Marianita Botkin and left a voicemail.

## 2023-08-06 MED ORDER — APIXABAN 2.5 MG PO TABS: 2.5000 mg | ORAL_TABLET | Freq: Two times a day (BID) | ORAL | 1 refills | Status: DC

## 2023-08-06 NOTE — Telephone Encounter (Signed)
Spoke with Barbara Butler and updated her that the eliquis 5mg  was being changed to eliquis 2.5mg  twice a day. She stated they have been through this before with this dose changing and was thankful for the update. She states she the pt has a couple of 5mg  tablets left and made her aware that the few tablets they have left of eliquis 5mg  can be broken in half to get 2.5mg  starting today and once the 2.5mg  tablet arrives she can start 1 tablet of the 2.5mg  twice a day. She states if the mail order not received before she runs out she will either call us or get a small supply from local pharmacy since she has a refill on her current bottle. Advised to update [place] if needed so we can assist if needed.

## 2023-09-04 ENCOUNTER — Ambulatory Visit: Payer: Medicare PPO | Admitting: Cardiovascular Disease

## 2023-09-15 ENCOUNTER — Emergency Department

## 2023-09-15 ENCOUNTER — Inpatient Hospital Stay
Admission: EM | Admit: 2023-09-15 | Discharge: 2023-09-22 | DRG: 982 | Disposition: A | Discharge status: Home Health | Attending: Hospitalist | Admitting: Hospitalist

## 2023-09-15 ENCOUNTER — Other Ambulatory Visit: Payer: Self-pay

## 2023-09-15 DIAGNOSIS — K5731 Diverticulosis of large intestine without perforation or abscess with bleeding: Principal | ICD-10-CM | POA: Diagnosis present

## 2023-09-15 DIAGNOSIS — K838 Other specified diseases of biliary tract: Secondary | ICD-10-CM | POA: Diagnosis not present

## 2023-09-15 DIAGNOSIS — Z7901 Long term (current) use of anticoagulants: Secondary | ICD-10-CM

## 2023-09-15 DIAGNOSIS — R7401 Elevation of levels of liver transaminase levels: Secondary | ICD-10-CM | POA: Diagnosis present

## 2023-09-15 DIAGNOSIS — K2970 Gastritis, unspecified, without bleeding: Secondary | ICD-10-CM | POA: Diagnosis present

## 2023-09-15 DIAGNOSIS — K921 Melena: Secondary | ICD-10-CM | POA: Diagnosis present

## 2023-09-15 DIAGNOSIS — K922 Gastrointestinal hemorrhage, unspecified: Secondary | ICD-10-CM | POA: Diagnosis not present

## 2023-09-15 DIAGNOSIS — K625 Hemorrhage of anus and rectum: Principal | ICD-10-CM

## 2023-09-15 DIAGNOSIS — Z7984 Long term (current) use of oral hypoglycemic drugs: Secondary | ICD-10-CM

## 2023-09-15 DIAGNOSIS — I4892 Unspecified atrial flutter: Secondary | ICD-10-CM

## 2023-09-15 DIAGNOSIS — Z79899 Other long term (current) drug therapy: Secondary | ICD-10-CM

## 2023-09-15 DIAGNOSIS — F0390 Unspecified dementia without behavioral disturbance: Secondary | ICD-10-CM | POA: Diagnosis present

## 2023-09-15 DIAGNOSIS — D62 Acute posthemorrhagic anemia: Secondary | ICD-10-CM | POA: Diagnosis present

## 2023-09-15 DIAGNOSIS — I10 Essential (primary) hypertension: Secondary | ICD-10-CM

## 2023-09-15 DIAGNOSIS — K449 Diaphragmatic hernia without obstruction or gangrene: Secondary | ICD-10-CM | POA: Diagnosis present

## 2023-09-15 DIAGNOSIS — Z7982 Long term (current) use of aspirin: Secondary | ICD-10-CM

## 2023-09-15 DIAGNOSIS — Z87440 Personal history of urinary (tract) infections: Secondary | ICD-10-CM

## 2023-09-15 DIAGNOSIS — Z87891 Personal history of nicotine dependence: Secondary | ICD-10-CM

## 2023-09-15 DIAGNOSIS — Z79890 Hormone replacement therapy (postmenopausal): Secondary | ICD-10-CM

## 2023-09-15 DIAGNOSIS — E1165 Type 2 diabetes mellitus with hyperglycemia: Secondary | ICD-10-CM

## 2023-09-15 DIAGNOSIS — K640 First degree hemorrhoids: Secondary | ICD-10-CM | POA: Diagnosis present

## 2023-09-15 DIAGNOSIS — E039 Hypothyroidism, unspecified: Secondary | ICD-10-CM

## 2023-09-15 DIAGNOSIS — R7989 Other specified abnormal findings of blood chemistry: Secondary | ICD-10-CM

## 2023-09-15 DIAGNOSIS — K8020 Calculus of gallbladder without cholecystitis without obstruction: Secondary | ICD-10-CM | POA: Diagnosis present

## 2023-09-15 DIAGNOSIS — Z8673 Personal history of transient ischemic attack (TIA), and cerebral infarction without residual deficits: Secondary | ICD-10-CM

## 2023-09-15 DIAGNOSIS — I480 Paroxysmal atrial fibrillation: Secondary | ICD-10-CM | POA: Diagnosis present

## 2023-09-15 DIAGNOSIS — E1140 Type 2 diabetes mellitus with diabetic neuropathy, unspecified: Secondary | ICD-10-CM | POA: Diagnosis present

## 2023-09-15 LAB — COMPREHENSIVE METABOLIC PANEL
ALT: 206 U/L — ABNORMAL HIGH (ref 0–44)
AST: 230 U/L — ABNORMAL HIGH (ref 15–41)
Albumin: 4.2 g/dL (ref 3.5–5.0)
Alkaline Phosphatase: 346 U/L — ABNORMAL HIGH (ref 38–126)
Anion gap: 12 (ref 5–15)
BUN: 18 mg/dL (ref 8–23)
CO2: 22 mmol/L (ref 22–32)
Calcium: 9.5 mg/dL (ref 8.9–10.3)
Chloride: 104 mmol/L (ref 98–111)
Creatinine, Ser: 0.87 mg/dL (ref 0.44–1.00)
GFR, Estimated: >60 mL/min (ref >60)
Glucose, Bld: 216 mg/dL — ABNORMAL HIGH (ref 70–99)
Potassium: 3.9 mmol/L (ref 3.5–5.1)
Sodium: 138 mmol/L (ref 135–145)
Total Bilirubin: 0.8 mg/dL (ref 0.0–1.2)
Total Protein: 7.5 g/dL (ref 6.5–8.1)

## 2023-09-15 LAB — CBC
HCT: 37.7 % (ref 36.0–46.0)
Hemoglobin: 12.3 g/dL (ref 12.0–15.0)
MCH: 29.2 pg (ref 26.0–34.0)
MCHC: 32.6 g/dL (ref 30.0–36.0)
MCV: 89.5 fL (ref 80.0–100.0)
Platelets: 330 10*3/uL (ref 150–400)
RBC: 4.21 MIL/uL (ref 3.87–5.11)
RDW: 16.3 % — ABNORMAL HIGH (ref 11.5–15.5)
WBC: 6.4 10*3/uL (ref 4.0–10.5)
nRBC: 0 % (ref 0.0–0.2)

## 2023-09-15 LAB — APTT: aPTT: 27 s (ref 24–36)

## 2023-09-15 LAB — PROTIME-INR
INR: 1.4 — ABNORMAL HIGH (ref 0.8–1.2)
Prothrombin Time: 17.2 s — ABNORMAL HIGH (ref 11.4–15.2)

## 2023-09-15 LAB — TYPE AND SCREEN
ABO/RH(D): O POS
Antibody Screen: NEGATIVE

## 2023-09-15 LAB — CBG MONITORING, ED: Glucose-Capillary: 167 mg/dL — ABNORMAL HIGH (ref 70–99)

## 2023-09-15 LAB — GLUCOSE, CAPILLARY: Glucose-Capillary: 190 mg/dL — ABNORMAL HIGH (ref 70–99)

## 2023-09-15 MED ORDER — INSULIN ASPART 100 UNIT/ML IJ SOLN
0.0000 [IU] | Freq: Three times a day (TID) | INTRAMUSCULAR | Status: DC
  Administered 2023-09-17: 1 [IU] via SUBCUTANEOUS
  Administered 2023-09-17: 5 [IU] via SUBCUTANEOUS
  Administered 2023-09-17 – 2023-09-18 (×2): 2 [IU] via SUBCUTANEOUS
  Administered 2023-09-19: 3 [IU] via SUBCUTANEOUS
  Administered 2023-09-20: 2 [IU] via SUBCUTANEOUS
  Administered 2023-09-20: 5 [IU] via SUBCUTANEOUS
  Administered 2023-09-20: 3 [IU] via SUBCUTANEOUS
  Administered 2023-09-21: 7 [IU] via SUBCUTANEOUS
  Administered 2023-09-21 – 2023-09-22 (×3): 2 [IU] via SUBCUTANEOUS
  Filled 2023-09-15 (×12): qty 1

## 2023-09-15 MED ORDER — ACETAMINOPHEN 650 MG RE SUPP: 650.0000 mg | Freq: Four times a day (QID) | RECTAL | Status: DC | PRN

## 2023-09-15 MED ORDER — ONDANSETRON HCL 4 MG PO TABS: 4.0000 mg | ORAL_TABLET | Freq: Four times a day (QID) | ORAL | Status: DC | PRN

## 2023-09-15 MED ORDER — HYDRALAZINE HCL 50 MG PO TABS
50.0000 mg | ORAL_TABLET | Freq: Two times a day (BID) | ORAL | Status: DC
  Administered 2023-09-16 – 2023-09-17 (×2): 50 mg via ORAL
  Filled 2023-09-15 (×2): qty 1

## 2023-09-15 MED ORDER — AMLODIPINE BESYLATE 5 MG PO TABS
5.0000 mg | ORAL_TABLET | Freq: Every day | ORAL | Status: DC
  Administered 2023-09-16 – 2023-09-17 (×2): 5 mg via ORAL
  Filled 2023-09-15 (×2): qty 1

## 2023-09-15 MED ORDER — SODIUM CHLORIDE 0.9% FLUSH
3.0000 mL | Freq: Two times a day (BID) | INTRAVENOUS | Status: DC
  Administered 2023-09-15 – 2023-09-21 (×8): 3 mL via INTRAVENOUS

## 2023-09-15 MED ORDER — LEVOTHYROXINE SODIUM 88 MCG PO TABS
88.0000 ug | ORAL_TABLET | Freq: Every day | ORAL | Status: DC
  Administered 2023-09-17 – 2023-09-22 (×5): 88 ug via ORAL
  Filled 2023-09-15 (×7): qty 1

## 2023-09-15 MED ORDER — ONDANSETRON HCL 4 MG/2ML IJ SOLN: 4.0000 mg | Freq: Four times a day (QID) | INTRAMUSCULAR | Status: DC | PRN

## 2023-09-15 MED ORDER — METOPROLOL SUCCINATE ER 50 MG PO TB24
50.0000 mg | ORAL_TABLET | Freq: Every day | ORAL | Status: DC
  Administered 2023-09-16 – 2023-09-22 (×6): 50 mg via ORAL
  Filled 2023-09-15 (×6): qty 1

## 2023-09-15 MED ORDER — ACETAMINOPHEN 325 MG PO TABS: 650.0000 mg | ORAL_TABLET | Freq: Four times a day (QID) | ORAL | Status: DC | PRN

## 2023-09-15 NOTE — Assessment & Plan Note (Signed)
-   Hold home Eliquis - Continue home metoprolol

## 2023-09-15 NOTE — ED Notes (Signed)
 RN updated daughter(Joana Viviann Spare) on room assignment.

## 2023-09-15 NOTE — Assessment & Plan Note (Signed)
 Last A1c of 7.8% on 09/05/2023.  - SSI, sensitive

## 2023-09-15 NOTE — ED Triage Notes (Signed)
 Pt comes with blood stools. Pt was recently dx with UTI and placed on meds. Pt has large amount of dark blood in stool today and also day before. Pt does have hx of hemorrhoids.

## 2023-09-15 NOTE — ED Provider Notes (Signed)
 [facility] Provider Note    Event Date/Time   First MD Initiated Contact with Patient 09/15/23 1550     (approximate)  History   Chief Complaint: Rectal Bleeding  HPI  Barbara Butler is a 89 y.o. female with a past medical history of anemia, atrial fibrillation on Eliquis, diabetes, hypertension, CVA, presents to the emergency department for rectal bleeding.  According to the daughter last week patient went to a PCP appointment and had a urinary tract infection diagnosed on routine lab work.  Was started on amoxicillin.  Over the weekend patient began experiencing blood within the stool and today the patient has had 3 large bowel movements of pure blood per daughter.  No history of GI bleed previously.  Patient denies any abdominal pain.  Physical Exam   Triage Vital Signs: ED Triage Vitals  Encounter Vitals Group     BP 09/15/23 1145 127/76     Systolic BP Percentile --      Diastolic BP Percentile --      Pulse Rate 09/15/23 1145 90     Resp 09/15/23 1145 18     Temp 09/15/23 1145 98.2 F (36.8 C)     Temp src --      SpO2 09/15/23 1145 100 %     Weight 09/15/23 1144 120 lb (54.4 kg)     Height 09/15/23 1144 5\' 4"  (1.626 m)     Head Circumference --      Peak Flow --      Pain Score 09/15/23 1144 0     Pain Loc --      Pain Education --      Exclude from Growth Chart --     Most recent vital signs: Vitals:   09/15/23 1145 09/15/23 1556  BP: 127/76 139/76  Pulse: 90 91  Resp: 18 15  Temp: 98.2 F (36.8 C)   SpO2: 100% 100%    General: Awake, no distress.  CV:  Good peripheral perfusion.  Regular rate and rhythm  Resp:  Normal effort.  Equal breath sounds bilaterally.  Abd:  No distention.  Soft, nontender.   Other:  Rectal examination shows hematochezia strongly guaiac positive.   ED Results / Procedures / Treatments   RADIOLOGY  Ultrasound read as negative for cholecystitis.  Does have a dilated common bile duct however  patient's total bilirubin is normal and LFTs were elevated on prior lab work in [employer].   MEDICATIONS ORDERED IN ED: Medications - No data to display   IMPRESSION / MDM / ASSESSMENT AND PLAN / ED COURSE  I reviewed the triage vital signs and the nursing notes.  Patient's presentation is most consistent with acute presentation with potential threat to life or bodily function.  Patient presents emergency department for multiple bloody bowel movements today.  Patient has no pain but she is on Eliquis for atrial fibrillation.  No history of GI bleed previously.  Patient has a completely benign abdomen.  Reassuring vital signs.  However daughter states 3 larger bowel movements of pure blood today.  Rectal examination today shows hematochezia strongly guaiac positive.  Patient's hemoglobin has dropped approximately 1 point from 3 months ago however remains at 12.3.  Chemistry does show mild LFT elevation which does appear new although benign abdomen.  We will obtain a right upper quadrant ultrasound as a precaution.  Patient will require admission to the hospital for further workup and treatment.  Ultrasound shows no significant finding.  I was able to review care everywhere labs which does show a mild LFT elevation recently as well.  Total bilirubin is normal.  We will admit to the hospital service given the patient's GI bleed.  FINAL CLINICAL IMPRESSION(S) / ED DIAGNOSES   GI bleed   Note:  This document was prepared using Dragon voice recognition software and may include unintentional dictation errors.   Minna Antis, MD 09/15/23 2019

## 2023-09-15 NOTE — Assessment & Plan Note (Signed)
 Hold home Eliquis

## 2023-09-15 NOTE — Assessment & Plan Note (Signed)
-   Resume home antihypertensives 

## 2023-09-15 NOTE — Assessment & Plan Note (Signed)
 Continue home Synthroid

## 2023-09-15 NOTE — Assessment & Plan Note (Addendum)
 Per chart review, patient has a history of chronic bile duct dilation, previously 10 mm, now 18 but is otherwise asymptomatic.  LFT elevation noted, with only slight worsening compared to CMP obtained on 2/28. If this was choledocholithiasis, would have expected patient to be symptomatic by now.   - Hold off on MRCP at this time

## 2023-09-15 NOTE — H&P (Signed)
 History and Physical    Patient: Barbara Butler ZOX:[id number] DOB: 1929/12/30 DOA: 09/15/2023 DOS: the patient was seen and examined on 09/15/2023 PCP: [name], MD  Patient coming from: Home  Chief Complaint:  Chief Complaint  Patient presents with   Rectal Bleeding   HPI: Barbara Butler is a 89 y.o. female with medical history significant of atrial fibrillation on Eliquis, hypertension, type 2 diabetes mellitus, CVA,  hypothyroidism, who presents to the ED due to dark stool.  Patient is a poor historian, but she denies any pain at this time including with bowel movements.   She denies any chest pain, dizziness, shortness of breath, abdominal pain, nausea, vomiting.  She cannot recall exactly when hematochezia began.  Per chart review, patient's daughter noted that over the last 2-3 days, patient has been having blood mixed within her stool and then today, she had 3 large bowel movements that were pure bright red blood per rectum.  ED Course:  On arrival to the ED, patient was normotensive at 127/76 with a HR of 90. She was afebrile at 98.2. She was saturating at 100% on room air. Initial work up notable for hemoglobin of 12.3, AST 230, ALT 206, alk phos 346. RUQ u/s with CBD dilating at 18 mm and cholelithiasis. TRH consulted for admission.  Review of Systems: As mentioned in the history of present illness. All other systems reviewed and are negative.  Past Medical History:  Diagnosis Date   Anemia    Atrial fibrillation (HCC)    Diabetes mellitus without complication (HCC)    diet controlled   Diabetic neuropathy (HCC)    Dysrhythmia    Hypertension    Hypothyroidism    Stroke [facility])    Past Surgical History:  Procedure Laterality Date   CATARACT EXTRACTION W/PHACO Right 01/28/2019   Procedure: CATARACT EXTRACTION PHACO AND INTRAOCULAR LENS PLACEMENT (IOC) RIGHT DIABETES;  Surgeon: Elliot Cousin, MD;  Location: [facility] ORS;  Service: Ophthalmology;  Laterality: Right;   [place] 00:49.3 CDE 8.12 Fluid Pack Lot # [id number]   CATARACT EXTRACTION W/PHACO Left 02/25/2019   Procedure: CATARACT EXTRACTION PHACO AND INTRAOCULAR LENS PLACEMENT (IOC), LEFT, DIABETIC, VISION BLUE;  Surgeon: Elliot Cousin, MD;  Location: [facility] ORS;  Service: Ophthalmology;  Laterality: Left;  [place]  01:10 CDE 11.43 Fluid pack lot # [id number]   FRACTURE SURGERY     INTRAMEDULLARY (IM) NAIL INTERTROCHANTERIC Right 02/06/2023   Procedure: INTRAMEDULLARY NAILING OF RIGHT FEMUR;  Surgeon: Roby Lofts, MD;  Location: [facility] OR;  Service: Orthopedics;  Laterality: Right;   ORIF WRIST FRACTURE Right 06/28/2016   Procedure: OPEN REDUCTION INTERNAL FIXATION (ORIF) WRIST FRACTURE;  Surgeon: Kennedy Bucker, MD;  Location: [facility] ORS;  Service: Orthopedics;  Laterality: Right;   SALPINGECTOMY     Social History:  reports that she quit smoking about 65 years ago. Her smoking use included cigarettes. She has never used smokeless tobacco. She reports that she does not currently use alcohol. She reports that she does not use drugs.  No Known Allergies  Family History  Problem Relation Age of Onset   Breast cancer Neg Hx     Prior to Admission medications   Medication Sig Start Date End Date Taking? Authorizing Provider  acetaminophen (TYLENOL) 325 MG tablet Take 1-2 tablets (325-650 mg total) by mouth every 6 (six) hours as needed for mild pain (pain score 1-3 or temp > 100.5). 02/11/23   Rodolph Bong, MD  amLODipine (NORVASC) 5 MG tablet Take 1 tablet (  5 mg total) by mouth daily. 05/27/23   Furth, Cadence H, PA-C  apixaban (ELIQUIS) 2.5 MG TABS tablet Take 1 tablet (2.5 mg total) by mouth 2 (two) times daily. 08/06/23   Iran Ouch, MD  Ascorbic Acid (VITAMIN C) 1000 MG tablet Take 1,000 mg by mouth daily at 12 noon. Patient not taking: Reported on 05/27/2023    [provider]  cholecalciferol (VITAMIN D3) 25 MCG (1000 UT) tablet Take 1,000 Units by mouth daily at 12 noon. Patient not taking:  Reported on 05/27/2023    [provider]  feeding supplement (ENSURE ENLIVE / ENSURE PLUS) LIQD Take 237 mLs by mouth daily. 02/11/23   Rodolph Bong, MD  furosemide (LASIX) 20 MG tablet Take 2 tablets (40 mg total) by mouth daily. 06/13/23   Furth, Cadence H, PA-C  hydrALAZINE (APRESOLINE) 50 MG tablet Take 1 tablet (50 mg total) by mouth 2 (two) times daily. 05/27/23   Furth, Cadence H, PA-C  levothyroxine (SYNTHROID, LEVOTHROID) 88 MCG tablet Take 88 mcg by mouth daily.  12/05/15   [provider]  metFORMIN (GLUCOPHAGE) 500 MG tablet Take 1 tablet (500 mg total) by mouth 2 (two) times daily with a meal. 06/17/23   Wouk, Wilfred Curtis, MD  methocarbamol (ROBAXIN) 500 MG tablet Take 1 tablet (500 mg total) by mouth every 6 (six) hours as needed for muscle spasms. Patient not taking: Reported on 05/27/2023 02/11/23   Rodolph Bong, MD  metoprolol succinate (TOPROL-XL) 50 MG 24 hr tablet Take 1 tablet (50 mg total) by mouth daily. Take with or immediately following a meal. 05/27/23 08/25/23  Furth, Cadence H, PA-C  Multiple Vitamins-Minerals (CENTRUM SILVER 50+WOMEN PO) Take 1 tablet by mouth daily.    [provider]  oxyCODONE (ROXICODONE) 5 MG immediate release tablet Take 1 tablet (5 mg total) by mouth every 8 (eight) hours as needed. 06/28/23 06/27/24  Chesley Noon, MD  polyethylene glycol (MIRALAX / GLYCOLAX) 17 g packet Take 17 g by mouth daily as needed for mild constipation. 02/10/23   Rodolph Bong, MD  saccharomyces boulardii (FLORASTOR) 250 MG capsule Take 250 mg by mouth 2 (two) times daily. Patient not taking: Reported on 05/27/2023    [provider]    Physical Exam: Vitals:   09/15/23 1830 09/15/23 1900 09/15/23 2130 09/15/23 2147  BP: 120/73 115/72 114/70   Pulse: 95 91 83   Resp:  19 18   Temp:    98.1 F (36.7 C)  TempSrc:    Oral  SpO2: 100% 100% 100%   Weight:      Height:       Physical Exam Vitals and nursing note  reviewed.  Constitutional:      General: She is not in acute distress.    Appearance: She is normal weight. She is not toxic-appearing.  HENT:     Head: Normocephalic and atraumatic.     Mouth/Throat:     Mouth: Mucous membranes are moist.     Pharynx: Oropharynx is clear.  Eyes:     Conjunctiva/sclera: Conjunctivae normal.     Pupils: Pupils are equal, round, and reactive to light.  Cardiovascular:     Rate and Rhythm: Normal rate and regular rhythm.     Heart sounds: No murmur heard.    No gallop.  Pulmonary:     Effort: Pulmonary effort is normal. No respiratory distress.     Breath sounds: Normal breath sounds. No wheezing or rales.  Abdominal:  General: Bowel sounds are normal. There is no distension.     Palpations: Abdomen is soft.     Tenderness: There is no abdominal tenderness. There is no guarding.  Musculoskeletal:     Right lower leg: No edema.     Left lower leg: No edema.  Skin:    General: Skin is warm and dry.  Neurological:     Mental Status: She is alert.  Psychiatric:        Mood and Affect: Mood normal.        Behavior: Behavior normal.    Data Reviewed: CBC with WBC of 6.4, hemoglobin of 12.3, and platelets of 330.  CMP with sodium of 139, potassium 3.9, bicarb 22, glucose of 216, BUN 18, creatinine 0.87, AST 230, ALT 206, alkaline phos 346, and GFR > 60.  INR of 1.4 PTT within normal limits  US ABDOMEN LIMITED RUQ (LIVER/GB) Result Date: 09/15/2023 CLINICAL DATA:  Elevated LFTs EXAM: ULTRASOUND ABDOMEN LIMITED RIGHT UPPER QUADRANT COMPARISON:  CTA abdomen pelvis 04/05/2023 FINDINGS: Gallbladder: Cholelithiasis. No gallbladder wall thickening or pericholecystic fluid. Negative sonographic Murphy's sign. Common bile duct: Diameter: 18 mm, dilated. Intrahepatic biliary ductal dilatation is noted. Liver: There is a 4.1 cm hepatic cyst. Mild increased hepatic parenchymal echogenicity. Portal vein is patent on color Doppler imaging with normal direction  of blood flow towards the liver. Other: None. IMPRESSION: 1. Cholelithiasis without secondary signs of acute cholecystitis. 2. Dilated common bile duct measuring up to 18 mm. Intrahepatic biliary ductal dilatation is noted. Recommend correlation with LFTs. If there is concern for biliary obstruction, recommend MRI/MRCP. 3. Mild increased hepatic parenchymal echogenicity suggestive of steatosis. Electronically Signed   By: Annia Belt M.D.   On: 09/15/2023 19:35   Results are pending, will review when available.  Assessment and Plan:  Lower GI bleed Patient is presenting with painless hematochezia concerning for diverticular bleed.  Previous admission for rectal bleeding at that time, suspected to be due to hemorrhoids.  CTA at that time demonstrated extensive diverticulosis.  Hemoglobin is stable at this time.  - CBC twice daily - Clear liquid diet - Hold off on CTA unless evidence of hemodynamic compromise or sharp decrease in hemoglobin - Defer GI consultation at this time - Hold home Eliquis  LFT elevation As noted above, LFTs are elevated, slightly worse compared to 2/28.  Asymptomatic with no abdominal pain noted.  - Recheck CMP in the a.m.  Common bile duct dilatation Per chart review, patient has a history of chronic bile duct dilation, previously 10 mm, now 18 but is otherwise asymptomatic.  LFT elevation noted, with only slight worsening compared to CMP obtained on 2/28. If this was choledocholithiasis, would have expected patient to be symptomatic by now.   - Hold off on MRCP at this time  Atrial flutter (HCC) - Hold home Eliquis - Continue home metoprolol  History of CVA (cerebrovascular accident) - Hold home Eliquis  Hypothyroidism - Continue home Synthroid  Hypertension - Resume home antihypertensives  Type 2 diabetes mellitus with hyperglycemia, without long-term current use of insulin (HCC) Last A1c of 7.8% on 09/05/2023.  - SSI, sensitive  Advance Care  Planning:   Code Status: Full Code Confirmed by patient.   Consults: GI  Family Communication: No family  Severity of Illness: The appropriate patient status for this patient is OBSERVATION. Observation status is judged to be reasonable and necessary in order to provide the required intensity of service to ensure the patient's safety. The patient's  presenting symptoms, physical exam findings, and initial radiographic and laboratory data in the context of their medical condition is felt to place them at decreased risk for further clinical deterioration. Furthermore, it is anticipated that the patient will be medically stable for discharge from the hospital within 2 midnights of admission.   Author: Verdene Lennert, MD 09/15/2023 10:07 PM  For on call review [email or web address].

## 2023-09-15 NOTE — Assessment & Plan Note (Addendum)
 Patient is presenting with painless hematochezia concerning for diverticular bleed.  Previous admission for rectal bleeding at that time, suspected to be due to hemorrhoids.  CTA at that time demonstrated extensive diverticulosis.  Hemoglobin is stable at this time.  - CBC twice daily - Clear liquid diet - Hold off on CTA unless evidence of hemodynamic compromise or sharp decrease in hemoglobin - Defer GI consultation at this time - Hold home Eliquis

## 2023-09-15 NOTE — Assessment & Plan Note (Signed)
 As noted above, LFTs are elevated, slightly worse compared to 2/28.  Asymptomatic with no abdominal pain noted.  - Recheck CMP in the a.m.

## 2023-09-16 DIAGNOSIS — D62 Acute posthemorrhagic anemia: Secondary | ICD-10-CM

## 2023-09-16 DIAGNOSIS — I4892 Unspecified atrial flutter: Secondary | ICD-10-CM | POA: Diagnosis not present

## 2023-09-16 DIAGNOSIS — K922 Gastrointestinal hemorrhage, unspecified: Secondary | ICD-10-CM | POA: Diagnosis not present

## 2023-09-16 LAB — CBC
HCT: 27 % — ABNORMAL LOW (ref 36.0–46.0)
Hemoglobin: 9 g/dL — ABNORMAL LOW (ref 12.0–15.0)
MCH: 29.8 pg (ref 26.0–34.0)
MCHC: 33.3 g/dL (ref 30.0–36.0)
MCV: 89.4 fL (ref 80.0–100.0)
Platelets: 254 10*3/uL (ref 150–400)
RBC: 3.02 MIL/uL — ABNORMAL LOW (ref 3.87–5.11)
RDW: 16.5 % — ABNORMAL HIGH (ref 11.5–15.5)
WBC: 7.1 10*3/uL (ref 4.0–10.5)
nRBC: 0.3 % — ABNORMAL HIGH (ref 0.0–0.2)

## 2023-09-16 LAB — GLUCOSE, CAPILLARY
Glucose-Capillary: 159 mg/dL — ABNORMAL HIGH (ref 70–99)
Glucose-Capillary: 199 mg/dL — ABNORMAL HIGH (ref 70–99)
Glucose-Capillary: 229 mg/dL — ABNORMAL HIGH (ref 70–99)
Glucose-Capillary: 212 mg/dL — ABNORMAL HIGH (ref 70–99)

## 2023-09-16 LAB — HEPATIC FUNCTION PANEL
ALT: 124 U/L — ABNORMAL HIGH (ref 0–44)
AST: 86 U/L — ABNORMAL HIGH (ref 15–41)
Albumin: 3.3 g/dL — ABNORMAL LOW (ref 3.5–5.0)
Alkaline Phosphatase: 237 U/L — ABNORMAL HIGH (ref 38–126)
Bilirubin, Direct: <0.1 mg/dL (ref 0.0–0.2)
Total Bilirubin: 0.5 mg/dL (ref 0.0–1.2)
Total Protein: 6 g/dL — ABNORMAL LOW (ref 6.5–8.1)

## 2023-09-16 LAB — BASIC METABOLIC PANEL
Anion gap: 9 (ref 5–15)
BUN: 33 mg/dL — ABNORMAL HIGH (ref 8–23)
CO2: 21 mmol/L — ABNORMAL LOW (ref 22–32)
Calcium: 9.1 mg/dL (ref 8.9–10.3)
Chloride: 105 mmol/L (ref 98–111)
Creatinine, Ser: 1.06 mg/dL — ABNORMAL HIGH (ref 0.44–1.00)
GFR, Estimated: 49 mL/min — ABNORMAL LOW (ref >60)
Glucose, Bld: 213 mg/dL — ABNORMAL HIGH (ref 70–99)
Potassium: 4 mmol/L (ref 3.5–5.1)
Sodium: 135 mmol/L (ref 135–145)

## 2023-09-16 LAB — HEMOGLOBIN
Hemoglobin: 8.7 g/dL — ABNORMAL LOW (ref 12.0–15.0)
Hemoglobin: 8.7 g/dL — ABNORMAL LOW (ref 12.0–15.0)

## 2023-09-16 NOTE — Progress Notes (Signed)
   09/16/23 1315  Spiritual Encounters  Type of Visit Initial  Care provided to: Pt and family  Referral source Chaplain team  Reason for visit Advance directives  OnCall Visit Yes  Interventions  Spiritual Care Interventions Made Established relationship of care and support;Compassionate presence;Reflective listening  Intervention Outcomes  Outcomes Connection to spiritual care;Awareness around self/spiritual resourses;Awareness of support  Spiritual Care Plan  Spiritual Care Issues Still Outstanding No further spiritual care needs at this time (see row info)   Chaplain completed Advance Directives for patient

## 2023-09-16 NOTE — Hospital Course (Signed)
 Barbara Butler is a 89 y.o. female with medical history significant of atrial fibrillation on Eliquis, hypertension, type 2 diabetes mellitus, CVA,  hypothyroidism, who presents to the ED due to fresh red blood per rectum.  CT scan showed multiple diverticulosis without diverticulitis.   Eliquis was discontinued, patient monitored for hemoglobin.

## 2023-09-16 NOTE — TOC CM/SW Note (Signed)
 Transition of Care [facility]) - Inpatient Brief Assessment   Patient Details  Name: Barbara Butler MRN: [id number] Date of Birth: 10-14-1929  Transition of Care [facility]) CM/SW Contact:    Chapman Fitch, RN Phone Number: 09/16/2023, 3:27 PM   Clinical Narrative:   Transition of Care (TOC) Screening Note   Patient Details  Name: Barbara Butler Date of Birth: December 08, 1929   Transition of Care [facility]) CM/SW Contact:    Chapman Fitch, RN Phone Number: 09/16/2023, 3:27 PM    Transition of Care Department [facility]) has reviewed patient and no TOC needs have been identified at this time.  If new patient transition needs arise, please place a TOC consult.    Transition of Care Asessment:

## 2023-09-16 NOTE — Progress Notes (Signed)
  Chaplain On-Call responded to Spiritual Care Consult Order from Verdene Lennert, MD.  The request was for Advance Directives information for the patient. Dr. Huel Cote included a comment that Maurilio Lovely will arrive today at 1300 hours to assist the patient with the AD documents. Her comment also stated the desire that a Chaplain could be present to assist.  Chaplain provided the AD documents to the patient, and learned that Dede Dobesh is the daughter-in-law of the patient.  This Chaplain will refer the request to the Afternoon Chaplain after shift change, in order to follow up and complete the Order.  Chaplain Morene Crocker., [facility]

## 2023-09-16 NOTE — Progress Notes (Signed)
  Progress Note   Patient: Barbara Butler GNF:[id number] DOB: 11/10/1929 DOA: 09/15/2023     0 DOS: the patient was seen and examined on 09/16/2023   Brief hospital course:  Barbara Butler is a 89 y.o. female with medical history significant of atrial fibrillation on Eliquis, hypertension, type 2 diabetes mellitus, CVA,  hypothyroidism, who presents to the ED due to fresh red blood per rectum.  CT scan showed multiple diverticulosis without diverticulitis.   Eliquis was discontinued, patient monitored for hemoglobin.   Principal Problem:   Lower GI bleed Active Problems:   Common bile duct dilatation   LFT elevation   Atrial flutter (HCC)   Type 2 diabetes mellitus with hyperglycemia, without long-term current use of insulin (HCC)   Hypertension   Hypothyroidism   History of CVA (cerebrovascular accident)   Assessment and Plan: * Lower GI bleed likely secondary diverticulosis with bleeding. Acute blood loss anemia. Patient hemoglobin today is 9.0, she had no additional bleeding.  Will continue to check hemoglobin every 8 hours, discontinued Eliquis.  If hemoglobin drops more, will consider IR consult for mesentery thrombosis.  Transfuse when hemoglobin less than 7.  LFT elevation Common bile duct dilatation To be chronic in nature, not much change compared to prior study.  Atrial flutter (HCC) History of CVA. Continue metoprolol, Eliquis discontinued.  Hypothyroidism - Continue home Synthroid  Hypertension Continue beta-blocker.  Type 2 diabetes mellitus with hyperglycemia, without long-term current use of insulin (HCC) Last A1c of 7.8% on 09/05/2023.  - SSI, sensitive       Subjective:  Patient has no additional bleeding since arriving to hospital.  No nausea vomiting.  Physical Exam: Vitals:   09/15/23 2233 09/16/23 0341 09/16/23 0350 09/16/23 0730  BP: 94/73 128/76 105/73 139/80  Pulse: (!) 52 95 (!) 102 97  Resp: 16 16 15 18   Temp: (!) 97.3 F (36.3 C)  98.4 F (36.9 C) 98.3 F (36.8 C) 98.3 F (36.8 C)  TempSrc:  Oral  Oral  SpO2: 95% 99% 100% 100%  Weight:      Height:       General exam: Appears calm and comfortable  Respiratory system: Clear to auscultation. Respiratory effort normal. Cardiovascular system: Relatively regular. No JVD, murmurs, rubs, gallops or clicks. No pedal edema. Gastrointestinal system: Abdomen is nondistended, soft and nontender. No organomegaly or masses felt. Normal bowel sounds heard. Central nervous system: Alert and oriented x2. No focal neurological deficits. Extremities: Symmetric 5 x 5 power. Skin: No rashes, lesions or ulcers Psychiatry:  Mood & affect appropriate.    Data Reviewed:  CT scan of the lab results reviewed.  Family Communication: daughter updated over the phone.   Disposition: Status is: Observation      Time spent: 35 minutes  Author: Marrion Coy, MD 09/16/2023 12:24 PM  For on call review [email or web address].

## 2023-09-16 NOTE — Progress Notes (Signed)
 Pt had a BM with a scant amount of dark red blood in it. MD aware. No new ordersat this time. Reva Bores 09/16/23 4:12 PM

## 2023-09-17 DIAGNOSIS — K922 Gastrointestinal hemorrhage, unspecified: Secondary | ICD-10-CM | POA: Diagnosis not present

## 2023-09-17 LAB — GLUCOSE, CAPILLARY
Glucose-Capillary: 277 mg/dL — ABNORMAL HIGH (ref 70–99)
Glucose-Capillary: 191 mg/dL — ABNORMAL HIGH (ref 70–99)
Glucose-Capillary: 148 mg/dL — ABNORMAL HIGH (ref 70–99)
Glucose-Capillary: 175 mg/dL — ABNORMAL HIGH (ref 70–99)

## 2023-09-17 LAB — HEMOGLOBIN: Hemoglobin: 8 g/dL — ABNORMAL LOW (ref 12.0–15.0)

## 2023-09-17 MED ORDER — BOOST / RESOURCE BREEZE PO LIQD CUSTOM
1.0000 | Freq: Three times a day (TID) | ORAL | Status: DC
  Administered 2023-09-17 – 2023-09-22 (×8): 1 via ORAL

## 2023-09-17 NOTE — Plan of Care (Signed)
  Problem: Coping: Goal: Ability to adjust to condition or change in health will improve Outcome: Progressing   Problem: Skin Integrity: Goal: Risk for impaired skin integrity will decrease Outcome: Progressing   Problem: Education: Goal: Knowledge of General Education information will improve Description: Including pain rating scale, medication(s)/side effects and non-pharmacologic comfort measures Outcome: Progressing   Problem: Coping: Goal: Level of anxiety will decrease Outcome: Progressing   Problem: Pain Managment: Goal: General experience of comfort will improve and/or be controlled Outcome: Progressing   Problem: Safety: Goal: Ability to remain free from injury will improve Outcome: Progressing   Problem: Skin Integrity: Goal: Risk for impaired skin integrity will decrease Outcome: Progressing

## 2023-09-17 NOTE — Progress Notes (Signed)
  PROGRESS NOTE    Barbara Butler  ZOX:[id number] DOB: 09-Mar-1930 DOA: 09/15/2023 PCP: [name], MD  [id number]  LOS: 0 days   Brief hospital course:   Assessment & Plan: Barbara Butler is a 89 y.o. female with medical history significant of atrial fibrillation on Eliquis, hypertension, type 2 diabetes mellitus, CVA,  hypothyroidism, who presents to the ED due to fresh red blood per rectum.  CT scan showed multiple diverticulosis without diverticulitis.   Eliquis was discontinued, patient monitored for hemoglobin.  # GI bleed --originally thought to be diverticular bleeds, however, family reported mostly black stools. --family to consider EGD  Acute blood loss anemia. --Hgb dropped by 3g. 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   Atrial flutter (HCC) History of CVA. Continue metoprolol --hold Eliquis   Hypothyroidism - Continue home Synthroid   Hypertension --cont Toprol --hold amlodipine and hydralazine   Type 2 diabetes mellitus with hyperglycemia, without long-term current use of insulin (HCC) Last A1c of 7.8% on 09/05/2023. --ACHS and SSI     DVT prophylaxis: SCD/Compression stockings Code Status: Full code  Family Communication: family updated at bedside today Level of care: Med-Surg Dispo:   The patient is from: home Anticipated d/c is to: home Anticipated d/c date is: 2-3 days   Subjective and Interval History:  Pt continued to have melena.  No abdominal pain.   Objective: Vitals:   09/16/23 2103 09/17/23 0437 09/17/23 0928 09/17/23 1635  BP: 117/75 122/79 134/87 128/79  Pulse:  82 98 91  Resp:  20 12 18   Temp:  98.4 F (36.9 C) 98.5 F (36.9 C) 98.5 F (36.9 C)  TempSrc:   Oral Oral  SpO2:  100% 100% 100%  Weight:      Height:        Intake/Output Summary (Last 24 hours) at 09/17/2023 1838 Last data filed at 09/16/2023  1900 Gross per 24 hour  Intake 240 ml  Output --  Net 240 ml   Filed Weights   09/15/23 1144  Weight: 54.4 kg    Examination:   Constitutional: NAD, AAOx3 HEENT: conjunctivae and lids normal, EOMI CV: No cyanosis.   RESP: normal respiratory effort, on RA Neuro: II - XII grossly intact.     Data Reviewed: I have personally reviewed labs and imaging studies  Time spent: 50 minutes  Darlin Priestly, MD [facility] If 7PM-7AM, please contact night-coverage 09/17/2023, 6:38 PM

## 2023-09-17 NOTE — Plan of Care (Signed)

## 2023-09-17 NOTE — Care Management Obs Status (Signed)
 MEDICARE OBSERVATION STATUS NOTIFICATION   Patient Details  Name: Barbara Butler MRN: [id number] Date of Birth: 08-16-1929   Medicare Observation Status Notification Given:  Orland Dec, CMA 09/17/2023, 8:54 AM

## 2023-09-18 ENCOUNTER — Encounter: Payer: Self-pay | Admitting: Internal Medicine

## 2023-09-18 ENCOUNTER — Encounter
Admission: EM | Disposition: A | Discharge status: Home Health | Payer: Self-pay | Source: Home / Self Care | Attending: Hospitalist

## 2023-09-18 ENCOUNTER — Observation Stay: Admitting: Anesthesiology

## 2023-09-18 ENCOUNTER — Other Ambulatory Visit: Payer: Self-pay

## 2023-09-18 DIAGNOSIS — K922 Gastrointestinal hemorrhage, unspecified: Secondary | ICD-10-CM | POA: Diagnosis not present

## 2023-09-18 HISTORY — PX: FLEXIBLE SIGMOIDOSCOPY: SHX5431

## 2023-09-18 HISTORY — PX: ESOPHAGOGASTRODUODENOSCOPY: SHX5428

## 2023-09-18 LAB — GLUCOSE, CAPILLARY
Glucose-Capillary: 186 mg/dL — ABNORMAL HIGH (ref 70–99)
Glucose-Capillary: 200 mg/dL — ABNORMAL HIGH (ref 70–99)
Glucose-Capillary: 168 mg/dL — ABNORMAL HIGH (ref 70–99)
Glucose-Capillary: 121 mg/dL — ABNORMAL HIGH (ref 70–99)
Glucose-Capillary: 223 mg/dL — ABNORMAL HIGH (ref 70–99)

## 2023-09-18 LAB — CBC
HCT: 23.8 % — ABNORMAL LOW (ref 36.0–46.0)
Hemoglobin: 7.8 g/dL — ABNORMAL LOW (ref 12.0–15.0)
MCH: 30 pg (ref 26.0–34.0)
MCHC: 32.8 g/dL (ref 30.0–36.0)
MCV: 91.5 fL (ref 80.0–100.0)
Platelets: 248 10*3/uL (ref 150–400)
RBC: 2.6 MIL/uL — ABNORMAL LOW (ref 3.87–5.11)
RDW: 16.4 % — ABNORMAL HIGH (ref 11.5–15.5)
WBC: 5.9 10*3/uL (ref 4.0–10.5)
nRBC: 1.9 % — ABNORMAL HIGH (ref 0.0–0.2)

## 2023-09-18 LAB — BASIC METABOLIC PANEL
Anion gap: 9 (ref 5–15)
BUN: 18 mg/dL (ref 8–23)
CO2: 21 mmol/L — ABNORMAL LOW (ref 22–32)
Calcium: 8.9 mg/dL (ref 8.9–10.3)
Chloride: 108 mmol/L (ref 98–111)
Creatinine, Ser: 0.77 mg/dL (ref 0.44–1.00)
GFR, Estimated: >60 mL/min (ref >60)
Glucose, Bld: 172 mg/dL — ABNORMAL HIGH (ref 70–99)
Potassium: 3.9 mmol/L (ref 3.5–5.1)
Sodium: 138 mmol/L (ref 135–145)

## 2023-09-18 LAB — MAGNESIUM: Magnesium: 2 mg/dL (ref 1.7–2.4)

## 2023-09-18 SURGERY — EGD (ESOPHAGOGASTRODUODENOSCOPY)
Anesthesia: General

## 2023-09-18 MED ORDER — PROPOFOL 10 MG/ML IV BOLUS
INTRAVENOUS | Status: DC | PRN
  Administered 2023-09-18: 10 mg via INTRAVENOUS
  Administered 2023-09-18: 50 mg via INTRAVENOUS

## 2023-09-18 MED ORDER — LIDOCAINE HCL (CARDIAC) PF 100 MG/5ML IV SOSY
PREFILLED_SYRINGE | INTRAVENOUS | Status: DC | PRN
  Administered 2023-09-18: 50 mg via INTRAVENOUS

## 2023-09-18 MED ORDER — PROPOFOL 500 MG/50ML IV EMUL
INTRAVENOUS | Status: DC | PRN
Start: 2023-09-18 — End: 2023-09-18
  Administered 2023-09-18: 75 ug/kg/min via INTRAVENOUS

## 2023-09-18 MED ORDER — SODIUM CHLORIDE 0.9% FLUSH: 3.0000 mL | INTRAVENOUS | Status: DC | PRN

## 2023-09-18 MED ORDER — SODIUM CHLORIDE 0.9 % IV SOLN: INTRAVENOUS | Status: DC

## 2023-09-18 MED ORDER — SODIUM CHLORIDE 0.9% FLUSH: 3.0000 mL | Freq: Two times a day (BID) | INTRAVENOUS | Status: DC

## 2023-09-18 NOTE — Op Note (Signed)
 [facility] Patient Name: Barbara Butler Procedure Date: 09/18/2023 1:38 PM MRN: [id number] Account #: [id number] Date of Birth: [birth year] Admit Type: Inpatient Age: 89 Room: [facility] ENDO ROOM 4 Gender: Female Note Status: Finalized Instrument Name: Upper Endoscope [id number] Procedure:             Flexible Sigmoidoscopy Indications:           Hematochezia Providers:             Boykin Nearing. Norma Fredrickson MD, MD Referring MD:          [name], MD (Referring MD) Medicines:             Propofol per Anesthesia Complications:         No immediate complications. Estimated blood loss: None. Procedure:             Pre-Anesthesia Assessment:                        - The risks and benefits of the procedure and the                         sedation options and risks were discussed with the                         patient. All questions were answered and informed                         consent was obtained.                        - Patient identification and proposed procedure were                         verified prior to the procedure by the nurse. The                         procedure was verified in the procedure room.                        - ASA Grade Assessment: III - A patient with severe                         systemic disease.                        - After reviewing the risks and benefits, the patient                         was deemed in satisfactory condition to undergo the                         procedure.                        After obtaining informed consent, the scope was passed                         under direct vision. The Endoscope was introduced  through the anus and advanced to the the sigmoid                         colon. The flexible sigmoidoscopy was accomplished                         without difficulty. The patient tolerated the                         procedure well. The quality of the bowel preparation                          was adequate. Findings:      The perianal and digital rectal examinations were normal. Pertinent       negatives include normal sphincter tone and no palpable rectal lesions.      Non-bleeding internal hemorrhoids were found during retroflexion. The       hemorrhoids were Grade I (internal hemorrhoids that do not prolapse).      Many medium-mouthed diverticula were found in the sigmoid colon. There       was no evidence of diverticular bleeding. Estimated blood loss: none.      The exam was otherwise without abnormality. Impression:            - Non-bleeding internal hemorrhoids.                        - Mild diverticulosis in the sigmoid colon. There was                         no evidence of diverticular bleeding.                        - The examination was otherwise normal.                        - No specimens collected. Recommendation:        - Return patient to hospital ward for ongoing care.                        - Given both the EGD and sigmoidoscopy performed today                         revealed no active bleeding, I advise to continue                         anticoagulation.                        - Advance diet as tolerated - advance as tolerated to                         resume previous diet.                        - [employer] will sign off for now. Call us back if any                         changes clinically or if we can help for any reason. Procedure Code(s):     ---  Professional ---                        [phone], Sigmoidoscopy, flexible; diagnostic, including                         collection of specimen(s) by brushing or washing, when                         performed (separate procedure) Diagnosis Code(s):     --- Professional ---                        K57.30, Diverticulosis of large intestine without                         perforation or abscess without bleeding                        K92.1, Melena (includes Hematochezia)                         K64.0, First degree hemorrhoids CPT copyright 2022 American [employer] Association. All rights reserved. The codes documented in this report are preliminary and upon coder review may  be revised to meet current compliance requirements. Stanton Kidney MD, MD 09/18/2023 2:38:19 PM This report has been signed electronically. Number of Addenda: 0 Note Initiated On: 09/18/2023 1:38 PM Total Procedure Duration: 0 hours 2 minutes 14 seconds  Estimated Blood Loss:  Estimated blood loss: none. Estimated blood loss: none.      [facility]

## 2023-09-18 NOTE — Interval H&P Note (Signed)
 History and Physical Interval Note:  09/18/2023 12:17 PM  Barbara Butler  has presented today for surgery, with the diagnosis of melena, hematochezia.  The various methods of treatment have been discussed with the patient and family. After consideration of risks, benefits and other options for treatment, the patient has consented to  Procedure(s): EGD (ESOPHAGOGASTRODUODENOSCOPY) (N/A) SIGMOIDOSCOPY, FLEXIBLE (N/A) as a surgical intervention.  The patient's history has been reviewed, patient examined, no change in status, stable for surgery.  I have reviewed the patient's chart and labs.  Questions were answered to the patient's satisfaction.     [place], [place]

## 2023-09-18 NOTE — Transfer of Care (Signed)
 Immediate Anesthesia Transfer of Care Note  Patient: Barbara Butler  Procedure(s) Performed: EGD (ESOPHAGOGASTRODUODENOSCOPY) SIGMOIDOSCOPY, FLEXIBLE  Patient Location: PACU and Endoscopy Unit  Anesthesia Type:General  Level of Consciousness: sedated  Airway & Oxygen Therapy: Patient Spontanous Breathing and Patient connected to nasal cannula oxygen  Post-op Assessment: Report given to RN and Post -op Vital signs reviewed and stable  Post vital signs: Reviewed and stable  Last Vitals:  Vitals Value Taken Time  BP 92/64 09/18/23 1442  Temp    Pulse 89 09/18/23 1444  Resp 22 09/18/23 1444  SpO2 100 % 09/18/23 1444  Vitals shown include unfiled device data.  Last Pain:  Vitals:   09/18/23 1307  TempSrc: Temporal  PainSc:          Complications: No notable events documented.

## 2023-09-18 NOTE — Care Plan (Signed)
 Patient s/p endo procedures Patient awake and aware of plan to return to unit Report called to unit nurse MD to phone family with information

## 2023-09-18 NOTE — Plan of Care (Signed)
  Problem: Fluid Volume: Goal: Ability to maintain a balanced intake and output will improve Outcome: Progressing   Problem: Nutritional: Goal: Maintenance of adequate nutrition will improve Outcome: Progressing   Problem: Skin Integrity: Goal: Risk for impaired skin integrity will decrease Outcome: Progressing   Problem: Activity: Goal: Risk for activity intolerance will decrease Outcome: Progressing   Problem: Nutrition: Goal: Adequate nutrition will be maintained Outcome: Progressing   Problem: Coping: Goal: Level of anxiety will decrease Outcome: Progressing   Problem: Pain Managment: Goal: General experience of comfort will improve and/or be controlled Outcome: Progressing   Problem: Safety: Goal: Ability to remain free from injury will improve Outcome: Progressing

## 2023-09-18 NOTE — Evaluation (Signed)
 Physical Therapy Evaluation Patient Details Name: Barbara Butler MRN: [id number] DOB: [birth year] Today's Date: 09/18/2023  History of Present Illness  Pt is a 89 yo female that presented to the ED due to blood from rectum. PMH: HTN, T2DM, anemia, CAD, afib/a flutter, CVA.  Clinical Impression  Patient alert, oriented x4, HOH. Reported at baseline she lives alone but has an Landscape architect that assists with ADLs, IADLs, and is present for supervision for all mobility, normally with a SPC. She was able to perform bed mobility modI. Sit <> stand with RW and supervision. She ambulated ~56ft with RW and supervision, cued for RW management. Pt up in chair with needs in reach.  Overall the patient demonstrated deficits (see "PT Problem List") that impede the patient's functional abilities, safety, and mobility and would benefit from skilled PT intervention.          If plan is discharge home, recommend the following: A little help with bathing/dressing/bathroom;A little help with walking and/or transfers;Assistance with cooking/housework;Assist for transportation;Help with stairs or ramp for entrance   Can travel by private vehicle        Equipment Recommendations Rolling walker (2 wheels)  Recommendations for Other Services       Functional Status Assessment Patient has had a recent decline in their functional status and demonstrates the ability to make significant improvements in function in a reasonable and predictable amount of time.     Precautions / Restrictions Precautions Precautions: Fall Restrictions Weight Bearing Restrictions Per Provider Order: No      Mobility  Bed Mobility Overal bed mobility: Modified Independent                  Transfers Overall transfer level: Needs assistance Equipment used: Rolling walker (2 wheels) Transfers: Sit to/from Stand Sit to Stand: Supervision                Ambulation/Gait Ambulation/Gait assistance: Supervision Gait  Distance (Feet): 35 Feet Assistive device: Rolling walker (2 wheels)         General Gait Details: steady, no LOB  Stairs            Wheelchair Mobility     Tilt Bed    Modified Rankin (Stroke Patients Only)       Balance Overall balance assessment: Needs assistance Sitting-balance support: Feet supported Sitting balance-Leahy Scale: Good     Standing balance support: Bilateral upper extremity supported Standing balance-Leahy Scale: Fair                               Pertinent Vitals/Pain Pain Assessment Pain Assessment: No/denies pain    Home Living Family/patient expects to be discharged to:: Private residence Living Arrangements: Alone Available Help at Discharge: Family;Available PRN/intermittently;Personal care attendant;Available 24 hours/day Type of Home: House Home Access: Stairs to enter Entrance Stairs-Rails: Right;Left;Can reach both Entrance Stairs-Number of Steps: 5   Home Layout: One level Home Equipment: Cane - single point      Prior Function Prior Level of Function : Needs assist             Mobility Comments: per pt report aide is CGA-supervision for all mobility ADLs Comments: aide assists with all ADLs/IADLs and family assists with IADLs as well     Extremity/Trunk Assessment   Upper Extremity Assessment Upper Extremity Assessment: Overall WFL for tasks assessed    Lower Extremity Assessment Lower Extremity Assessment: Generalized weakness  Communication   Communication Factors Affecting Communication: Hearing impaired    Cognition Arousal: Alert Behavior During Therapy: WFL for tasks assessed/performed   PT - Cognitive impairments: No apparent impairments                         Following commands: Intact       Cueing       General Comments      Exercises     Assessment/Plan    PT Assessment Patient needs continued PT services  PT Problem List Decreased  strength;Decreased activity tolerance;Decreased balance;Decreased mobility       PT Treatment Interventions DME instruction;Balance training;Gait training;Neuromuscular re-education;Stair training;Therapeutic activities;Therapeutic exercise;Functional mobility training;Patient/family education    PT Goals (Current goals can be found in the Care Plan section)  Acute Rehab PT Goals Patient Stated Goal: to walk without any AD PT Goal Formulation: With patient Time For Goal Achievement: 10/02/23 Potential to Achieve Goals: Fair    Frequency Min 2X/week     Co-evaluation               AM-PAC PT "6 Clicks" Mobility  Outcome Measure Help needed turning from your back to your side while in a flat bed without using bedrails?: None Help needed moving from lying on your back to sitting on the side of a flat bed without using bedrails?: None Help needed moving to and from a bed to a chair (including a wheelchair)?: None Help needed standing up from a chair using your arms (e.g., wheelchair or bedside chair)?: None Help needed to walk in hospital room?: A Little Help needed climbing 3-5 steps with a railing? : A Little 6 Click Score: 22    End of Session   Activity Tolerance: Patient tolerated treatment well Patient left: in chair;with call bell/phone within reach;with chair alarm set Nurse Communication: Mobility status PT Visit Diagnosis: Other abnormalities of gait and mobility (R26.89);Difficulty in walking, not elsewhere classified (R26.2);Muscle weakness (generalized) (M62.81)    Time: 1610-9604 PT Time Calculation (min) (ACUTE ONLY): 16 min   Charges:   PT Evaluation $PT Eval Low Complexity: 1 Low PT Treatments $Therapeutic Activity: 8-22 mins PT General Charges $$ ACUTE PT VISIT: 1 Visit        Olga Coaster PT, DPT 1:41 PM,09/18/23

## 2023-09-18 NOTE — Progress Notes (Signed)
  PROGRESS NOTE    Barbara Butler  WUJ:[id number] DOB: March 10, 1930 DOA: 09/15/2023 PCP: [name], MD  [id number]  LOS: 0 days   Brief hospital course:   Assessment & Plan: Barbara Butler is a 89 y.o. female with medical history significant of atrial fibrillation on Eliquis, hypertension, type 2 diabetes mellitus, CVA,  hypothyroidism, who presents to the ED due to fresh red blood per rectum.  CT scan showed multiple diverticulosis without diverticulitis.   Eliquis was discontinued, patient monitored for hemoglobin.  # GI bleed --originally thought to be diverticular bleeds, however, family reported mostly black stools. --both the EGD and sigmoidoscopy performed today revealed no active bleeding  Acute blood loss anemia. --Hgb dropped by 3g. 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   Atrial flutter (HCC) History of CVA. Continue metoprolol --hold Eliquis for now   Hypothyroidism - Continue home Synthroid   Hypertension --cont Toprol --hold amlodipine and hydralazine   Type 2 diabetes mellitus with hyperglycemia, without long-term current use of insulin (HCC) Last A1c of 7.8% on 09/05/2023. --ACHS and SSI     DVT prophylaxis: SCD/Compression stockings Code Status: Full code  Family Communication: daughters updated on the phone today Level of care: Med-Surg Dispo:   The patient is from: home Anticipated d/c is to: home Anticipated d/c date is: tomorrow   Subjective and Interval History:  both the EGD and sigmoidoscopy performed today revealed no active bleeding.   Objective: Vitals:   09/18/23 1452 09/18/23 1500 09/18/23 1600 09/18/23 1606  BP: 113/73 118/76  136/78  Pulse: 91 90  99  Resp: (!) 29 (!) 21    Temp:   (!) 97.1 F (36.2 C)   TempSrc:   Oral   SpO2: 100% 97%  97%  Weight:      Height:        Intake/Output Summary (Last  24 hours) at 09/18/2023 1835 Last data filed at 09/18/2023 1435 Gross per 24 hour  Intake 50 ml  Output --  Net 50 ml   Filed Weights   09/15/23 1144 09/18/23 0357  Weight: 54.4 kg 54.9 kg    Examination:   Constitutional: NAD, alert HEENT: conjunctivae and lids normal, EOMI CV: No cyanosis.   RESP: normal respiratory effort, on RA Neuro: II - XII grossly intact.     Data Reviewed: I have personally reviewed labs and imaging studies  Time spent: 35 minutes  Darlin Priestly, MD [facility] If 7PM-7AM, please contact night-coverage 09/18/2023, 6:35 PM

## 2023-09-18 NOTE — Consult Note (Signed)
 [facility] GI Inpatient Consult Note   Jamey Reas, M.D.  Reason for Consult: Melena, hematochezia   Attending Requesting Consult: Darlin Priestly, M.D.  History of Present Illness: Barbara Butler is a 89 y.o. female with a history of atrial fibrillation on Eliquis who presents with combination of hematochezia and melena.  Patient daughter-in-law had reported patient had hematochezia 2 to 3 days before hospital admission on 09/15/2023.  Since hospital admission, there appears to be more melenic (black) appearance to the stool.  Patient denies any pain and appears relatively alert and oriented.  She does depend on her daughter-in-law to help with much of her care as well as with consents for procedures.  Patient denies any dysphagia, hematemesis, weight loss.  She has a poor memory for recent and remote medical events.  Eliquis has been held since hospital admission on 09/15/2023 according the patient's attending physician, Dr. Fran Lowes.  Hemoglobin has trended down from 12 at hospital admission to 7.8 on the day of consultation.  Past Medical History:  Past Medical History:  Diagnosis Date   Anemia    Atrial fibrillation (HCC)    Diabetes mellitus without complication (HCC)    diet controlled   Diabetic neuropathy (HCC)    Dysrhythmia    Hypertension    Hypothyroidism    Stroke (HCC) 01/2023   multiple strokes found at this time    Problem List: Patient Active Problem List   Diagnosis Date Noted   Lower GI bleed 09/15/2023   Common bile duct dilatation 09/15/2023   LFT elevation 09/15/2023   History of CVA (cerebrovascular accident) 06/16/2023   Acute metabolic encephalopathy 06/16/2023   AKI (acute kidney injury) (HCC) 06/16/2023   Hyperglycemia due to type 2 diabetes mellitus (HCC) 06/16/2023   Severe sepsis (HCC) 04/08/2023   Sepsis secondary to UTI (HCC) 04/05/2023   Diverticulosis of colon 04/05/2023   Cholelithiasis 04/05/2023   Leg swelling 04/05/2023   Fall  02/06/2023   Fall at nursing home, initial encounter 02/05/2023   Closed right hip fracture, initial encounter (HCC) 02/05/2023   Humeral surgical neck fracture 02/05/2023   Atrial flutter (HCC) 01/28/2023   Atrial fibrillation/flutter (HCC) 01/28/2023   Cerebrovascular accident (CVA) (HCC) 01/27/2023   Hypertension 01/26/2023   Hypothyroidism 01/26/2023   Confusion and disorientation / subacute cva. 01/26/2023   Type 2 diabetes mellitus with hyperglycemia, without long-term current use of insulin (HCC) 08/31/2019    Past Surgical History: Past Surgical History:  Procedure Laterality Date   CATARACT EXTRACTION W/PHACO Right 01/28/2019   Procedure: CATARACT EXTRACTION PHACO AND INTRAOCULAR LENS PLACEMENT (IOC) RIGHT DIABETES;  Surgeon: Elliot Cousin, MD;  Location: [facility] ORS;  Service: Ophthalmology;  Laterality: Right;  [place] 00:49.3 CDE 8.12 Fluid Pack Lot # [id number]   CATARACT EXTRACTION W/PHACO Left 02/25/2019   Procedure: CATARACT EXTRACTION PHACO AND INTRAOCULAR LENS PLACEMENT (IOC), LEFT, DIABETIC, VISION BLUE;  Surgeon: Elliot Cousin, MD;  Location: [facility] ORS;  Service: Ophthalmology;  Laterality: Left;  [place]  01:10 CDE 11.43 Fluid pack lot # [id number]   FRACTURE SURGERY Right 02/2023   hip   INTRAMEDULLARY (IM) NAIL INTERTROCHANTERIC Right 02/06/2023   Procedure: INTRAMEDULLARY NAILING OF RIGHT FEMUR;  Surgeon: Roby Lofts, MD;  Location: [facility];  Service: Orthopedics;  Laterality: Right;   ORIF WRIST FRACTURE Right 06/28/2016   Procedure: OPEN REDUCTION INTERNAL FIXATION (ORIF) WRIST FRACTURE;  Surgeon: Kennedy Bucker, MD;  Location: [facility] ORS;  Service: Orthopedics;  Laterality: Right;   SALPINGECTOMY  Allergies: No Known Allergies  Home Medications: Medications Prior to Admission  Medication Sig Dispense Refill Last Dose/Taking   amLODipine (NORVASC) 5 MG tablet Take 1 tablet (5 mg total) by mouth daily. 90 tablet 3 09/15/2023   amoxicillin (AMOXIL) 500 MG capsule Take 500  mg by mouth 3 (three) times daily.   09/15/2023   apixaban (ELIQUIS) 2.5 MG TABS tablet Take 1 tablet (2.5 mg total) by mouth 2 (two) times daily. 180 tablet 1 09/15/2023   aspirin EC 81 MG tablet Take 81 mg by mouth daily. Swallow whole.   09/15/2023   furosemide (LASIX) 20 MG tablet Take 2 tablets (40 mg total) by mouth daily. 90 tablet 3 09/15/2023   glimepiride (AMARYL) 1 MG tablet Take 1 mg by mouth daily.   09/15/2023   hydrALAZINE (APRESOLINE) 50 MG tablet Take 1 tablet (50 mg total) by mouth 2 (two) times daily. 180 tablet 3 09/15/2023   levothyroxine (SYNTHROID, LEVOTHROID) 88 MCG tablet Take 88 mcg by mouth daily.    09/15/2023   metFORMIN (GLUCOPHAGE) 500 MG tablet Take 1 tablet (500 mg total) by mouth 2 (two) times daily with a meal. (Patient taking differently: Take 500 mg by mouth daily with breakfast.) 60 tablet 5 09/15/2023   metoprolol succinate (TOPROL-XL) 50 MG 24 hr tablet Take 1 tablet (50 mg total) by mouth daily. Take with or immediately following a meal. 90 tablet 3 09/15/2023   Multiple Vitamins-Minerals (CENTRUM SILVER 50+WOMEN PO) Take 1 tablet by mouth daily.   09/15/2023   polyethylene glycol (MIRALAX / GLYCOLAX) 17 g packet Take 17 g by mouth daily as needed for mild constipation. 14 each 0 Taking As Needed   Home medication reconciliation was completed with the patient.   Scheduled Inpatient Medications:    feeding supplement  1 Container Oral TID BM   insulin aspart  0-9 Units Subcutaneous TID WC   levothyroxine  88 mcg Oral Q0600   metoprolol succinate  50 mg Oral Daily   sodium chloride flush  3 mL Intravenous Q12H    Continuous Inpatient Infusions:    PRN Inpatient Medications:  acetaminophen **OR** acetaminophen, ondansetron **OR** ondansetron (ZOFRAN) IV  Family History: family history is not on file.   GI Family History: Negative  Social History:   reports that she quit smoking about 65 years ago. Her smoking use included cigarettes. She has never used  smokeless tobacco. She reports that she does not currently use alcohol. She reports that she does not use drugs. The patient denies ETOH, tobacco, or drug use.    Review of Systems: Review of Systems - Negative except HPI  Physical Examination: BP 104/77 (BP Location: Right Arm)   Pulse 86   Temp 98.3 F (36.8 C) (Oral)   Resp 18   Ht 5\' 4"  (1.626 m)   Wt 54.9 kg   SpO2 99%   BMI 20.78 kg/m  Physical Exam Constitutional:      Appearance: Normal appearance. She is not toxic-appearing or diaphoretic.  HENT:     Head: Normocephalic and atraumatic.     Nose: Nose normal.     Mouth/Throat:     Mouth: Mucous membranes are dry.     Pharynx: Oropharynx is clear. No oropharyngeal exudate.  Eyes:     General: No scleral icterus.    Conjunctiva/sclera: Conjunctivae normal.     Pupils: Pupils are equal, round, and reactive to light.  Cardiovascular:     Rate and Rhythm: Normal rate.  Pulses: Normal pulses.     Heart sounds:     No gallop.  Pulmonary:     Effort: Pulmonary effort is normal. No respiratory distress.     Breath sounds: Normal breath sounds. No rhonchi.  Abdominal:     General: There is no distension.     Palpations: Abdomen is soft.     Tenderness: There is no abdominal tenderness. There is no guarding or rebound.     Hernia: No hernia is present.  Musculoskeletal:     Cervical back: Normal range of motion.  Skin:    General: Skin is warm and dry.     Capillary Refill: Capillary refill takes less than 2 seconds.     Coloration: Skin is not jaundiced.     Findings: No lesion.  Neurological:     Mental Status: She is alert. Mental status is at baseline.  Psychiatric:        Mood and Affect: Mood normal.        Thought Content: Thought content normal.     Data: Lab Results  Component Value Date   WBC 5.9 09/18/2023   HGB 7.8 (L) 09/18/2023   HCT 23.8 (L) 09/18/2023   MCV 91.5 09/18/2023   PLT 248 09/18/2023   Recent Labs  Lab 09/16/23 1954  09/17/23 0547 09/18/23 0423  HGB 8.7* 8.0* 7.8*   Lab Results  Component Value Date   NA 138 09/18/2023   K 3.9 09/18/2023   CL 108 09/18/2023   CO2 21 (L) 09/18/2023   BUN 18 09/18/2023   CREATININE 0.77 09/18/2023   Lab Results  Component Value Date   ALT 124 (H) 09/16/2023   AST 86 (H) 09/16/2023   ALKPHOS 237 (H) 09/16/2023   BILITOT 0.5 09/16/2023   Recent Labs  Lab 09/15/23 1245  APTT 27  INR 1.4*      Latest Ref Rng & Units 09/18/2023    4:23 AM 09/17/2023    5:47 AM 09/16/2023    7:54 PM  CBC  WBC 4.0 - 10.5 K/uL 5.9     Hemoglobin 12.0 - 15.0 g/dL 7.8  8.0  8.7   Hematocrit 36.0 - 46.0 % 23.8     Platelets 150 - 400 K/uL 248       STUDIES: No results found. @IMAGES @  Assessment:  Melena - Ddx includes Peptic ulcer disease, vascular malformations, esophagitis, UGI malignance, less likely right colon source. Hematochezia - Ddx includes hemorrhoids, stercoral ulcer of the rectum,  Diverticulosis, colon malignancy, ischemic colitis. Post-hemorrhagic anemia. Advanced age. Hypertension. Type II DM. History of atrial fib;flutter. Long term anticoagulation - Eliquis - held over the past 48-72 hrs. History of CVA.    Recommendations:  Proceed with EGD and flexible sigmoidoscopy. Case discussed with patient's daughter-in-law and legal power of attorney, Ms. Barbara Butler, who understands the indiciations, risks, alternative and potential complications of the procedures. These include, but are not limited to, bleeding, infection, perforation, damage to internal organs and/or oversedation. Ms. Menzie wishes for [place] to proceed. See the procedure reports and chart for further recommendations.  Thank you for the consult. Please call with questions or concerns.  Rosina Lowenstein, "Mellody Dance MD [facility] [address] [place], [place] [address] [phone]  09/18/2023 9:18 AM

## 2023-09-18 NOTE — H&P (View-Only) (Signed)
 [facility] GI Inpatient Consult Note   Jamey Reas, M.D.  Reason for Consult: Melena, hematochezia   Attending Requesting Consult: Darlin Priestly, M.D.  History of Present Illness: Barbara Butler is a 89 y.o. female with a history of atrial fibrillation on Eliquis who presents with combination of hematochezia and melena.  Patient daughter-in-law had reported patient had hematochezia 2 to 3 days before hospital admission on 09/15/2023.  Since hospital admission, there appears to be more melenic (black) appearance to the stool.  Patient denies any pain and appears relatively alert and oriented.  She does depend on her daughter-in-law to help with much of her care as well as with consents for procedures.  Patient denies any dysphagia, hematemesis, weight loss.  She has a poor memory for recent and remote medical events.  Eliquis has been held since hospital admission on 09/15/2023 according the patient's attending physician, Dr. Fran Lowes.  Hemoglobin has trended down from 12 at hospital admission to 7.8 on the day of consultation.  Past Medical History:  Past Medical History:  Diagnosis Date   Anemia    Atrial fibrillation (HCC)    Diabetes mellitus without complication (HCC)    diet controlled   Diabetic neuropathy (HCC)    Dysrhythmia    Hypertension    Hypothyroidism    Stroke (HCC) 01/2023   multiple strokes found at this time    Problem List: Patient Active Problem List   Diagnosis Date Noted   Lower GI bleed 09/15/2023   Common bile duct dilatation 09/15/2023   LFT elevation 09/15/2023   History of CVA (cerebrovascular accident) 06/16/2023   Acute metabolic encephalopathy 06/16/2023   AKI (acute kidney injury) (HCC) 06/16/2023   Hyperglycemia due to type 2 diabetes mellitus (HCC) 06/16/2023   Severe sepsis (HCC) 04/08/2023   Sepsis secondary to UTI (HCC) 04/05/2023   Diverticulosis of colon 04/05/2023   Cholelithiasis 04/05/2023   Leg swelling 04/05/2023   Fall  02/06/2023   Fall at nursing home, initial encounter 02/05/2023   Closed right hip fracture, initial encounter (HCC) 02/05/2023   Humeral surgical neck fracture 02/05/2023   Atrial flutter (HCC) 01/28/2023   Atrial fibrillation/flutter (HCC) 01/28/2023   Cerebrovascular accident (CVA) (HCC) 01/27/2023   Hypertension 01/26/2023   Hypothyroidism 01/26/2023   Confusion and disorientation / subacute cva. 01/26/2023   Type 2 diabetes mellitus with hyperglycemia, without long-term current use of insulin (HCC) 08/31/2019    Past Surgical History: Past Surgical History:  Procedure Laterality Date   CATARACT EXTRACTION W/PHACO Right 01/28/2019   Procedure: CATARACT EXTRACTION PHACO AND INTRAOCULAR LENS PLACEMENT (IOC) RIGHT DIABETES;  Surgeon: Elliot Cousin, MD;  Location: [facility] ORS;  Service: Ophthalmology;  Laterality: Right;  [place] 00:49.3 CDE 8.12 Fluid Pack Lot # [id number]   CATARACT EXTRACTION W/PHACO Left 02/25/2019   Procedure: CATARACT EXTRACTION PHACO AND INTRAOCULAR LENS PLACEMENT (IOC), LEFT, DIABETIC, VISION BLUE;  Surgeon: Elliot Cousin, MD;  Location: [facility] ORS;  Service: Ophthalmology;  Laterality: Left;  [place]  01:10 CDE 11.43 Fluid pack lot # [id number]   FRACTURE SURGERY Right 02/2023   hip   INTRAMEDULLARY (IM) NAIL INTERTROCHANTERIC Right 02/06/2023   Procedure: INTRAMEDULLARY NAILING OF RIGHT FEMUR;  Surgeon: Roby Lofts, MD;  Location: [facility];  Service: Orthopedics;  Laterality: Right;   ORIF WRIST FRACTURE Right 06/28/2016   Procedure: OPEN REDUCTION INTERNAL FIXATION (ORIF) WRIST FRACTURE;  Surgeon: Kennedy Bucker, MD;  Location: [facility] ORS;  Service: Orthopedics;  Laterality: Right;   SALPINGECTOMY  Allergies: No Known Allergies  Home Medications: Medications Prior to Admission  Medication Sig Dispense Refill Last Dose/Taking   amLODipine (NORVASC) 5 MG tablet Take 1 tablet (5 mg total) by mouth daily. 90 tablet 3 09/15/2023   amoxicillin (AMOXIL) 500 MG capsule Take 500  mg by mouth 3 (three) times daily.   09/15/2023   apixaban (ELIQUIS) 2.5 MG TABS tablet Take 1 tablet (2.5 mg total) by mouth 2 (two) times daily. 180 tablet 1 09/15/2023   aspirin EC 81 MG tablet Take 81 mg by mouth daily. Swallow whole.   09/15/2023   furosemide (LASIX) 20 MG tablet Take 2 tablets (40 mg total) by mouth daily. 90 tablet 3 09/15/2023   glimepiride (AMARYL) 1 MG tablet Take 1 mg by mouth daily.   09/15/2023   hydrALAZINE (APRESOLINE) 50 MG tablet Take 1 tablet (50 mg total) by mouth 2 (two) times daily. 180 tablet 3 09/15/2023   levothyroxine (SYNTHROID, LEVOTHROID) 88 MCG tablet Take 88 mcg by mouth daily.    09/15/2023   metFORMIN (GLUCOPHAGE) 500 MG tablet Take 1 tablet (500 mg total) by mouth 2 (two) times daily with a meal. (Patient taking differently: Take 500 mg by mouth daily with breakfast.) 60 tablet 5 09/15/2023   metoprolol succinate (TOPROL-XL) 50 MG 24 hr tablet Take 1 tablet (50 mg total) by mouth daily. Take with or immediately following a meal. 90 tablet 3 09/15/2023   Multiple Vitamins-Minerals (CENTRUM SILVER 50+WOMEN PO) Take 1 tablet by mouth daily.   09/15/2023   polyethylene glycol (MIRALAX / GLYCOLAX) 17 g packet Take 17 g by mouth daily as needed for mild constipation. 14 each 0 Taking As Needed   Home medication reconciliation was completed with the patient.   Scheduled Inpatient Medications:    feeding supplement  1 Container Oral TID BM   insulin aspart  0-9 Units Subcutaneous TID WC   levothyroxine  88 mcg Oral Q0600   metoprolol succinate  50 mg Oral Daily   sodium chloride flush  3 mL Intravenous Q12H    Continuous Inpatient Infusions:    PRN Inpatient Medications:  acetaminophen **OR** acetaminophen, ondansetron **OR** ondansetron (ZOFRAN) IV  Family History: family history is not on file.   GI Family History: Negative  Social History:   reports that she quit smoking about 65 years ago. Her smoking use included cigarettes. She has never used  smokeless tobacco. She reports that she does not currently use alcohol. She reports that she does not use drugs. The patient denies ETOH, tobacco, or drug use.    Review of Systems: Review of Systems - Negative except HPI  Physical Examination: BP 104/77 (BP Location: Right Arm)   Pulse 86   Temp 98.3 F (36.8 C) (Oral)   Resp 18   Ht 5\' 4"  (1.626 m)   Wt 54.9 kg   SpO2 99%   BMI 20.78 kg/m  Physical Exam Constitutional:      Appearance: Normal appearance. She is not toxic-appearing or diaphoretic.  HENT:     Head: Normocephalic and atraumatic.     Nose: Nose normal.     Mouth/Throat:     Mouth: Mucous membranes are dry.     Pharynx: Oropharynx is clear. No oropharyngeal exudate.  Eyes:     General: No scleral icterus.    Conjunctiva/sclera: Conjunctivae normal.     Pupils: Pupils are equal, round, and reactive to light.  Cardiovascular:     Rate and Rhythm: Normal rate.  Pulses: Normal pulses.     Heart sounds:     No gallop.  Pulmonary:     Effort: Pulmonary effort is normal. No respiratory distress.     Breath sounds: Normal breath sounds. No rhonchi.  Abdominal:     General: There is no distension.     Palpations: Abdomen is soft.     Tenderness: There is no abdominal tenderness. There is no guarding or rebound.     Hernia: No hernia is present.  Musculoskeletal:     Cervical back: Normal range of motion.  Skin:    General: Skin is warm and dry.     Capillary Refill: Capillary refill takes less than 2 seconds.     Coloration: Skin is not jaundiced.     Findings: No lesion.  Neurological:     Mental Status: She is alert. Mental status is at baseline.  Psychiatric:        Mood and Affect: Mood normal.        Thought Content: Thought content normal.     Data: Lab Results  Component Value Date   WBC 5.9 09/18/2023   HGB 7.8 (L) 09/18/2023   HCT 23.8 (L) 09/18/2023   MCV 91.5 09/18/2023   PLT 248 09/18/2023   Recent Labs  Lab 09/16/23 1954  09/17/23 0547 09/18/23 0423  HGB 8.7* 8.0* 7.8*   Lab Results  Component Value Date   NA 138 09/18/2023   K 3.9 09/18/2023   CL 108 09/18/2023   CO2 21 (L) 09/18/2023   BUN 18 09/18/2023   CREATININE 0.77 09/18/2023   Lab Results  Component Value Date   ALT 124 (H) 09/16/2023   AST 86 (H) 09/16/2023   ALKPHOS 237 (H) 09/16/2023   BILITOT 0.5 09/16/2023   Recent Labs  Lab 09/15/23 1245  APTT 27  INR 1.4*      Latest Ref Rng & Units 09/18/2023    4:23 AM 09/17/2023    5:47 AM 09/16/2023    7:54 PM  CBC  WBC 4.0 - 10.5 K/uL 5.9     Hemoglobin 12.0 - 15.0 g/dL 7.8  8.0  8.7   Hematocrit 36.0 - 46.0 % 23.8     Platelets 150 - 400 K/uL 248       STUDIES: No results found. @IMAGES @  Assessment:  Melena - Ddx includes Peptic ulcer disease, vascular malformations, esophagitis, UGI malignance, less likely right colon source. Hematochezia - Ddx includes hemorrhoids, stercoral ulcer of the rectum,  Diverticulosis, colon malignancy, ischemic colitis. Post-hemorrhagic anemia. Advanced age. Hypertension. Type II DM. History of atrial fib;flutter. Long term anticoagulation - Eliquis - held over the past 48-72 hrs. History of CVA.    Recommendations:  Proceed with EGD and flexible sigmoidoscopy. Case discussed with patient's daughter-in-law and legal power of attorney, Ms. Elliemae Braman, who understands the indiciations, risks, alternative and potential complications of the procedures. These include, but are not limited to, bleeding, infection, perforation, damage to internal organs and/or oversedation. Ms. Menzie wishes for [place] to proceed. See the procedure reports and chart for further recommendations.  Thank you for the consult. Please call with questions or concerns.  Rosina Lowenstein, "Mellody Dance MD [facility] [address] [place], [place] [address] [phone]  09/18/2023 9:18 AM

## 2023-09-18 NOTE — Op Note (Signed)
 [facility]enter[facility] Patient Name: Barbara Butler Procedure Date: 09/18/2023 1:40 PM MRN: [id number] Account #: [id number] Date of Birth: October 05, 1929 Admit Type: Inpatient Age: 89 Room: [facility] ENDO ROOM 4 Gender: Female Note Status: Finalized Instrument Name: Upper Endoscope [id number] Procedure:             Upper GI endoscopy Indications:           Melena Providers:             Boykin Nearing. Norma Fredrickson MD, MD Referring MD:          [name], MD (Referring MD) Medicines:             Propofol per Anesthesia Complications:         No immediate complications. Estimated blood loss: None. Procedure:             Pre-Anesthesia Assessment:                        - The risks and benefits of the procedure and the                         sedation options and risks were discussed with the                         patient. All questions were answered and informed                         consent was obtained.                        - Patient identification and proposed procedure were                         verified prior to the procedure by the nurse. The                         procedure was verified in the procedure room.                        - ASA Grade Assessment: III - A patient with severe                         systemic disease.                        - After reviewing the risks and benefits, the patient                         was deemed in satisfactory condition to undergo the                         procedure.                        After obtaining informed consent, the endoscope was                         passed under direct vision. Throughout the procedure,  the patient's blood pressure, pulse, and oxygen                         saturations were monitored continuously. The Endoscope                         was introduced through the mouth, and advanced to the                         third part of duodenum. The upper GI endoscopy was                          accomplished without difficulty. The patient tolerated                         the procedure well. Findings:      The esophagus was normal.      Scattered moderate inflammation characterized by linear erosions was       found in the gastric body. Estimated blood loss: none.      A 1 cm hiatal hernia was present.      The exam of the stomach was otherwise normal.      The examined duodenum was normal. Impression:            - Normal esophagus.                        - Gastritis.                        - 1 cm hiatal hernia.                        - Normal examined duodenum.                        - No specimens collected. Recommendation:        - Proceed with flexible sigmoidoscopy Procedure Code(s):     --- Professional ---                        [phone], Esophagogastroduodenoscopy, flexible,                         transoral; diagnostic, including collection of                         specimen(s) by brushing or washing, when performed                         (separate procedure) Diagnosis Code(s):     --- Professional ---                        K92.1, Melena (includes Hematochezia)                        K44.9, Diaphragmatic hernia without obstruction or                         gangrene  K29.70, Gastritis, unspecified, without bleeding CPT copyright 2022 [employer]. All rights reserved. The codes documented in this report are preliminary and upon coder review may  be revised to meet current compliance requirements. Stanton Kidney MD, MD 09/18/2023 2:30:42 PM This report has been signed electronically. Number of Addenda: 0 Note Initiated On: 09/18/2023 1:40 PM Estimated Blood Loss:  Estimated blood loss: none.      [facility]

## 2023-09-18 NOTE — Anesthesia Preprocedure Evaluation (Signed)
 Anesthesia Evaluation  Patient identified by MRN, date of birth, ID band Patient awake    Reviewed: Allergy & Precautions, H&P , NPO status , Patient's Chart, lab work & pertinent test results, reviewed documented beta blocker date and time   History of Anesthesia Complications Negative for: history of anesthetic complications  Airway Mallampati: II  TM Distance: >3 FB Neck ROM: full    Dental  (+) Dental Advidsory Given, Partial Upper, Partial Lower, Missing, Poor Dentition   Pulmonary neg shortness of breath, neg COPD, neg recent URI, former smoker   Pulmonary exam normal breath sounds clear to auscultation       Cardiovascular Exercise Tolerance: Good hypertension, (-) angina (-) Past MI and (-) Cardiac Stents + dysrhythmias Atrial Fibrillation + Valvular Problems/Murmurs  Rhythm:regular Rate:Normal     Neuro/Psych neg Seizures CVA, Residual Symptoms  negative psych ROS   GI/Hepatic negative GI ROS, Neg liver ROS,,,  Endo/Other  diabetesHypothyroidism    Renal/GU negative Renal ROS  negative genitourinary   Musculoskeletal   Abdominal   Peds  Hematology  (+) Blood dyscrasia, anemia   Anesthesia Other Findings Past Medical History: No date: Anemia No date: Atrial fibrillation (HCC) No date: Diabetes mellitus without complication (HCC)     Comment:  diet controlled No date: Diabetic neuropathy (HCC) No date: Dysrhythmia No date: Hypertension No date: Hypothyroidism 01/2023: Stroke [facility])     Comment:  multiple strokes found at this time   Reproductive/Obstetrics negative OB ROS                             Anesthesia Physical Anesthesia Plan  ASA: 3  Anesthesia Plan: General   Post-op Pain Management:    Induction: Intravenous  PONV Risk Score and Plan: 3 and Propofol infusion and TIVA  Airway Management Planned: Natural Airway and Nasal Cannula  Additional Equipment:    Intra-op Plan:   Post-operative Plan:   Informed Consent: I have reviewed the patients History and Physical, chart, labs and discussed the procedure including the risks, benefits and alternatives for the proposed anesthesia with the patient or authorized representative who has indicated his/her understanding and acceptance.     Dental Advisory Given  Plan Discussed with: Anesthesiologist, CRNA and Surgeon  Anesthesia Plan Comments:        Anesthesia Quick Evaluation

## 2023-09-19 ENCOUNTER — Encounter
Admission: EM | Disposition: A | Discharge status: Home Health | Payer: Self-pay | Source: Home / Self Care | Attending: Hospitalist

## 2023-09-19 ENCOUNTER — Observation Stay

## 2023-09-19 ENCOUNTER — Encounter: Payer: Self-pay | Admitting: Internal Medicine

## 2023-09-19 ENCOUNTER — Inpatient Hospital Stay

## 2023-09-19 DIAGNOSIS — E1165 Type 2 diabetes mellitus with hyperglycemia: Secondary | ICD-10-CM | POA: Diagnosis present

## 2023-09-19 DIAGNOSIS — I10 Essential (primary) hypertension: Secondary | ICD-10-CM | POA: Diagnosis present

## 2023-09-19 DIAGNOSIS — Z7901 Long term (current) use of anticoagulants: Secondary | ICD-10-CM | POA: Diagnosis not present

## 2023-09-19 DIAGNOSIS — K640 First degree hemorrhoids: Secondary | ICD-10-CM | POA: Diagnosis present

## 2023-09-19 DIAGNOSIS — Z87891 Personal history of nicotine dependence: Secondary | ICD-10-CM | POA: Diagnosis not present

## 2023-09-19 DIAGNOSIS — Z87440 Personal history of urinary (tract) infections: Secondary | ICD-10-CM | POA: Diagnosis not present

## 2023-09-19 DIAGNOSIS — I4892 Unspecified atrial flutter: Secondary | ICD-10-CM | POA: Diagnosis present

## 2023-09-19 DIAGNOSIS — E1140 Type 2 diabetes mellitus with diabetic neuropathy, unspecified: Secondary | ICD-10-CM | POA: Diagnosis present

## 2023-09-19 DIAGNOSIS — Z7984 Long term (current) use of oral hypoglycemic drugs: Secondary | ICD-10-CM | POA: Diagnosis not present

## 2023-09-19 DIAGNOSIS — Z7982 Long term (current) use of aspirin: Secondary | ICD-10-CM | POA: Diagnosis not present

## 2023-09-19 DIAGNOSIS — Z79890 Hormone replacement therapy (postmenopausal): Secondary | ICD-10-CM | POA: Diagnosis not present

## 2023-09-19 DIAGNOSIS — K922 Gastrointestinal hemorrhage, unspecified: Secondary | ICD-10-CM | POA: Diagnosis present

## 2023-09-19 DIAGNOSIS — K8020 Calculus of gallbladder without cholecystitis without obstruction: Secondary | ICD-10-CM | POA: Diagnosis present

## 2023-09-19 DIAGNOSIS — K449 Diaphragmatic hernia without obstruction or gangrene: Secondary | ICD-10-CM | POA: Diagnosis present

## 2023-09-19 DIAGNOSIS — K5731 Diverticulosis of large intestine without perforation or abscess with bleeding: Secondary | ICD-10-CM | POA: Diagnosis present

## 2023-09-19 DIAGNOSIS — E039 Hypothyroidism, unspecified: Secondary | ICD-10-CM | POA: Diagnosis present

## 2023-09-19 DIAGNOSIS — K921 Melena: Secondary | ICD-10-CM | POA: Diagnosis present

## 2023-09-19 DIAGNOSIS — F0390 Unspecified dementia without behavioral disturbance: Secondary | ICD-10-CM | POA: Diagnosis present

## 2023-09-19 DIAGNOSIS — Z79899 Other long term (current) drug therapy: Secondary | ICD-10-CM | POA: Diagnosis not present

## 2023-09-19 DIAGNOSIS — D62 Acute posthemorrhagic anemia: Secondary | ICD-10-CM | POA: Diagnosis present

## 2023-09-19 DIAGNOSIS — Z8673 Personal history of transient ischemic attack (TIA), and cerebral infarction without residual deficits: Secondary | ICD-10-CM | POA: Diagnosis not present

## 2023-09-19 DIAGNOSIS — R7401 Elevation of levels of liver transaminase levels: Secondary | ICD-10-CM | POA: Diagnosis present

## 2023-09-19 DIAGNOSIS — I480 Paroxysmal atrial fibrillation: Secondary | ICD-10-CM | POA: Diagnosis present

## 2023-09-19 DIAGNOSIS — K2970 Gastritis, unspecified, without bleeding: Secondary | ICD-10-CM | POA: Diagnosis present

## 2023-09-19 HISTORY — PX: EMBOLIZATION (CATH LAB): CATH118239

## 2023-09-19 LAB — GLUCOSE, CAPILLARY
Glucose-Capillary: 219 mg/dL — ABNORMAL HIGH (ref 70–99)
Glucose-Capillary: 172 mg/dL — ABNORMAL HIGH (ref 70–99)
Glucose-Capillary: 173 mg/dL — ABNORMAL HIGH (ref 70–99)
Glucose-Capillary: 170 mg/dL — ABNORMAL HIGH (ref 70–99)

## 2023-09-19 LAB — PREPARE RBC (CROSSMATCH)

## 2023-09-19 LAB — MRSA NEXT GEN BY PCR, NASAL: MRSA by PCR Next Gen: NOT DETECTED

## 2023-09-19 LAB — HEMOGLOBIN: Hemoglobin: 7.6 g/dL — ABNORMAL LOW (ref 12.0–15.0)

## 2023-09-19 SURGERY — EMBOLIZATION
Anesthesia: Moderate Sedation

## 2023-09-19 MED ORDER — CHLORHEXIDINE GLUCONATE CLOTH 2 % EX PADS
6.0000 | MEDICATED_PAD | Freq: Every day | CUTANEOUS | Status: DC
  Administered 2023-09-20 – 2023-09-21 (×2): 6 via TOPICAL

## 2023-09-19 MED ORDER — SODIUM CHLORIDE 0.9% IV SOLUTION: Freq: Once | INTRAVENOUS | Status: DC

## 2023-09-19 MED ORDER — FENTANYL CITRATE PF 50 MCG/ML IJ SOSY
PREFILLED_SYRINGE | INTRAMUSCULAR | Status: AC
  Filled 2023-09-19: qty 1

## 2023-09-19 MED ORDER — MIDAZOLAM HCL 2 MG/2ML IJ SOLN
INTRAMUSCULAR | Status: AC
  Filled 2023-09-19: qty 2

## 2023-09-19 MED ORDER — SODIUM CHLORIDE 0.9% IV SOLUTION: Freq: Once | INTRAVENOUS | Status: AC

## 2023-09-19 MED ORDER — MIDAZOLAM HCL 2 MG/2ML IJ SOLN
INTRAMUSCULAR | Status: DC | PRN
  Administered 2023-09-19: .5 mg via INTRAVENOUS

## 2023-09-19 MED ORDER — HEPARIN (PORCINE) IN NACL 1000-0.9 UT/500ML-% IV SOLN
INTRAVENOUS | Status: DC | PRN
  Administered 2023-09-19: 1000 mL

## 2023-09-19 MED ORDER — TECHNETIUM TC 99M-LABELED RED BLOOD CELLS IV KIT
20.5200 | PACK | Freq: Once | INTRAVENOUS | Status: AC | PRN
Start: 2023-09-19 — End: 2023-09-19
  Administered 2023-09-19: 20.52 via INTRAVENOUS

## 2023-09-19 MED ORDER — IODIXANOL 320 MG/ML IV SOLN
INTRAVENOUS | Status: DC | PRN
  Administered 2023-09-19: 30 mL via INTRA_ARTERIAL

## 2023-09-19 MED ORDER — SODIUM CHLORIDE 0.9 % IV BOLUS: 250.0000 mL | Freq: Once | INTRAVENOUS | Status: DC | PRN

## 2023-09-19 MED ORDER — IOHEXOL 350 MG/ML SOLN
100.0000 mL | Freq: Once | INTRAVENOUS | Status: AC | PRN
  Administered 2023-09-19: 100 mL via INTRAVENOUS

## 2023-09-19 MED ORDER — ORAL CARE MOUTH RINSE: 15.0000 mL | OROMUCOSAL | Status: DC | PRN

## 2023-09-19 MED ORDER — FENTANYL CITRATE (PF) 100 MCG/2ML IJ SOLN
INTRAMUSCULAR | Status: DC | PRN
  Administered 2023-09-19: 25 ug via INTRAVENOUS

## 2023-09-19 MED ORDER — LIDOCAINE HCL (PF) 1 % IJ SOLN
INTRAMUSCULAR | Status: DC | PRN
  Administered 2023-09-19: 5 mL via INTRADERMAL

## 2023-09-19 SURGICAL SUPPLY — 16 items
CANNULA 5F STIFF (CANNULA) IMPLANT
CATH ANGIO 5F PIGTAIL 65CM (CATHETERS) IMPLANT
CATH MICROCATH PRGRT 2.8F 130 (MICROCATHETER) IMPLANT
CATH VERT 5FR 125CM (CATHETERS) IMPLANT
COVER PROBE ULTRASOUND 5X96 (MISCELLANEOUS) IMPLANT
DEVICE SAFEGUARD 24CM (GAUZE/BANDAGES/DRESSINGS) IMPLANT
DEVICE TORQUE (MISCELLANEOUS) IMPLANT
DRAPE BRACHIAL (DRAPES) IMPLANT
GLIDEWIRE ANGLED SS 035X260CM (WIRE) IMPLANT
MICROCATH PROGREAT 2.8F 130CM (MICROCATHETER) ×1 IMPLANT
MICROSPHERE 600+75UM (Embolic) IMPLANT
PACK ANGIOGRAPHY (CUSTOM PROCEDURE TRAY) ×1 IMPLANT
SHEATH BRITE TIP 5FRX11 (SHEATH) IMPLANT
SYR MEDRAD MARK 7 150ML (SYRINGE) IMPLANT
TUBING CONTRAST HIGH PRESS 72 (TUBING) IMPLANT
WIRE GUIDERIGHT .035X150 (WIRE) IMPLANT

## 2023-09-19 NOTE — Progress Notes (Signed)
       CROSS COVER NOTE  NAME: Barbara Butler MRN: [id number] DOB : 22-Aug-1929 ATTENDING PHYSICIAN: Darlin Priestly, MD    Date of Service   09/19/2023   HPI/Events of Note   Bright red blood per rectum - large amounts per nurse report  Interventions   Assessment/Plan:    09/19/2023    5:12 AM 09/19/2023    5:11 AM 09/19/2023    3:58 AM  Vitals with BMI  Systolic  123 135  Diastolic  76 64  Pulse 112 118 97   No change in mentation Stat hgb showed no significant change in prior at 7.6 however, additional bloody bowel movement approx 250 cc per nurse report + heart rate staring to increase.  Bedside patient does appear pale and weak Type and screen completed - transfuse 1 unit PRBC CT angio abdomen GI bleed study done with results still pending        Barbara Mesa NP [facility] Cross Cover 7pm-7am - check amion for availability Pager [phone]

## 2023-09-19 NOTE — Progress Notes (Signed)
  PROGRESS NOTE    ITZAYANA PARDY  ZOX:[id number] DOB: 04-20-1930 DOA: 09/15/2023 PCP: [name], MD  [id number]  LOS: 0 days   Brief hospital course:   Assessment & Plan: Barbara Butler is a 89 y.o. female with medical history significant of atrial fibrillation on Eliquis, hypertension, type 2 diabetes mellitus, CVA,  hypothyroidism, who presents to the ED due to fresh red blood per rectum.  CT scan showed multiple diverticulosis without diverticulitis.   Eliquis was discontinued, patient monitored for hemoglobin.  # GI bleed, diverticular bleed --both the EGD and sigmoidoscopy performed revealed no active bleeding. --multiple large bloody BM's today.  Nuclear scan pos for bleeding source.  Vascular surgery planning for embolization tonight.  Acute blood loss anemia. --Hgb dropped by 3g. --2u pRBC today for Hgb 7's and active GI bleed   LFT elevation Common bile duct dilatation To be chronic in nature, not much change compared to prior study.   Atrial flutter (HCC) History of CVA. --cont Toprol --hold Eliquis   Hypothyroidism - Continue home Synthroid   Hypertension --cont Toprol --hold amlodipine and hydralazine   Type 2 diabetes mellitus with hyperglycemia, without long-term current use of insulin (HCC) Last A1c of 7.8% on 09/05/2023. --ACHS and SSI     DVT prophylaxis: SCD/Compression stockings Code Status: Full code  Family Communication: daughters updated at bedside today Level of care: Med-Surg Dispo:   The patient is from: home Anticipated d/c is to: home Anticipated d/c date is: 2-3 days   Subjective and Interval History:  Pt had multiple large bloody BM's today.  Nuclear scan pos for bleeding source.  Vascular surgery planning for embolization tonight.   Objective: Vitals:   09/19/23 0900 09/19/23 1041 09/19/23 1150 09/19/23 1414  BP: 125/77 114/77 118/85 112/74  Pulse:    (!) 119  Resp: 18  18 19   Temp: 98.7 F (37.1 C)  98.2 F  (36.8 C) 98.6 F (37 C)  TempSrc: Oral  Oral Oral  SpO2: 100%  100% 100%  Weight:      Height:        Intake/Output Summary (Last 24 hours) at 09/19/2023 1826 Last data filed at 09/19/2023 1150 Gross per 24 hour  Intake 455.33 ml  Output 250 ml  Net 205.33 ml   Filed Weights   09/15/23 1144 09/18/23 0357  Weight: 54.4 kg 54.9 kg    Examination:   Constitutional: NAD, alert, oriented HEENT: conjunctivae and lids normal, EOMI CV: No cyanosis.   RESP: normal respiratory effort, on RA Neuro: II - XII grossly intact.     Data Reviewed: I have personally reviewed labs and imaging studies  Time spent: 50 minutes  Darlin Priestly, MD [facility] If 7PM-7AM, please contact night-coverage 09/19/2023, 6:26 PM

## 2023-09-19 NOTE — Plan of Care (Signed)

## 2023-09-19 NOTE — Plan of Care (Signed)
  Problem: Education: Goal: Ability to describe self-care measures that may prevent or decrease complications (Diabetes Survival Skills Education) will improve Outcome: Progressing   Problem: Coping: Goal: Ability to adjust to condition or change in health will improve Outcome: Progressing   Problem: Fluid Volume: Goal: Ability to maintain a balanced intake and output will improve 09/19/2023 1913 by Mathis Fare, RN Outcome: Progressing 09/19/2023 1913 by Mathis Fare, RN Outcome: Progressing   Problem: Health Behavior/Discharge Planning: Goal: Ability to identify and utilize available resources and services will improve 09/19/2023 1913 by Mathis Fare, RN Outcome: Progressing 09/19/2023 1913 by Mathis Fare, RN Outcome: Progressing Goal: Ability to manage health-related needs will improve Outcome: Progressing   Problem: Metabolic: Goal: Ability to maintain appropriate glucose levels will improve Outcome: Progressing   Problem: Nutritional: Goal: Maintenance of adequate nutrition will improve Outcome: Progressing Goal: Progress toward achieving an optimal weight will improve Outcome: Progressing   Problem: Skin Integrity: Goal: Risk for impaired skin integrity will decrease Outcome: Progressing   Problem: Tissue Perfusion: Goal: Adequacy of tissue perfusion will improve Outcome: Progressing

## 2023-09-19 NOTE — Progress Notes (Signed)
 Patient had large volume hematochezia episode ~1400. Order placed for STAT tagged RBC scan. She is getting ready to receive the 2 unit pRBCs. Further recs to come pending tagged RBC scan.   Jacob Moores, PA-C [facility] [phone]

## 2023-09-19 NOTE — Anesthesia Postprocedure Evaluation (Signed)
 Anesthesia Post Note  Patient: [name]  Procedure(s) Performed: EGD (ESOPHAGOGASTRODUODENOSCOPY) SIGMOIDOSCOPY, FLEXIBLE  Patient location during evaluation: PACU Anesthesia Type: General Level of consciousness: awake and alert Pain management: pain level controlled Vital Signs Assessment: post-procedure vital signs reviewed and stable Respiratory status: spontaneous breathing, nonlabored ventilation and respiratory function stable Cardiovascular status: blood pressure returned to baseline and stable Postop Assessment: no apparent nausea or vomiting Anesthetic complications: no   No notable events documented.   Last Vitals:  Vitals:   09/19/23 1150 09/19/23 1414  BP: 118/85 112/74  Pulse:  (!) 119  Resp: 18 19  Temp: 36.8 C 37 C  SpO2: 100% 100%    Last Pain:  Vitals:   09/19/23 1414  TempSrc: Oral  PainSc:                  Foye Deer

## 2023-09-19 NOTE — Op Note (Signed)
 [facility]  Percutaneous Study/Intervention Procedural Note     Surgeon(s): Iline Oven   Assistants: none  Pre-operative Diagnosis: 1. Lower GI bleed 2. cholelithiasis  Post-operative diagnosis:  Same  Procedure(s) Performed:             1.  Ultrasound guidance for vascular access left brachial artery             2.  Catheter placement into infrarenal abdominal aorta             3.  Aortogram and selective angiogram of the IMA and sub-selective sigmoid artery cannulation             4.  Microbead embolization of 1 ml with 600  polyvinyl alcohol beads.             5.  Pressure held on Left brachial artery with application of Safeguard device  Anesthesia: Moderate conscious sedation for approximately 36 minutes using 0.5 mg of Versed and 25 mcg of Fentanyl              EBL: 1 ml  Fluoro Time: 3.9 minutes  Contrast: 30 cc              Indications:  Patient is a 89 y.o.female with brisk lower GI bleeding with resultant anemia. The patient has a nuclear medicine study showing distal descending colon and sigmoid blush. The patient is brought in for angiography for further evaluation and potential treatment. Risks and benefits are discussed and informed consent is obtained  Procedure:  The patient was identified and appropriate procedural time out was performed.  The patient was then placed supine on the table and prepped and draped in the usual sterile fashion. Moderate conscious sedation was administered during a face to face encounter with the patient throughout the procedure with my supervision of the RN administering medicines and monitoring the patient's vital signs, pulse oximetry, telemetry and mental status throughout from the start of the procedure until the patient was taken to the recovery room.  Ultrasound was used to evaluate the left brachial artery.  It was patent .  A digital ultrasound image was acquired.  A Seldinger needle was used to access  the left brachial artery under direct ultrasound guidance and a permanent image was performed.  A 0.035 J wire was advanced without resistance and the bareback pigtail 5Fr catheter was advanced to the aortic arch.  The descending thoracic aorta was selected and the catheter advanced and wire switched to a long Glidewire.  An infrarenal catheter was positioned into the aorta and an AP aortogram was performed. This demonstrated a patent IMA with no apparent blush in the colon.  An angled catheter was used to selectively cannulate the IMA.  A selective IMA arteriogram demonstrated ectasia of the sigmoid artery but no blush. Based on her continued bleeding and the nuclear medicine study I elected to treat this area with embolization. I initially advanced the Pro-Great microcatheter out the sigmoid artery and instilled approximately 1 ml in this location. Angiogram following this showed the main vessels to be open with less brisk filling and reduction in the ectasia.  Again, completion angiogram showed the main vessels to be open with less brisk filling. I elected to terminate the procedure. The diagnostic catheter was removed. Left brachial artery pressure over the 5 Fr catheter access site was held for 10 minutes with excellent hemostatic result.  An armboard and a Safeguard device was placed with 40  ml of air inflated.  The radial signal was present with Doppler and fingers are pink and neuro intact and she has no pain or complaints.   The patient was taken to the ICU room in stable condition having tolerated the procedure well.     Findings:  see above  Disposition: Patient was taken to the ICU room in stable condition having tolerated the procedure well.  Complications:  None  Iline Oven 09/19/2023 8:56 PM

## 2023-09-19 NOTE — Progress Notes (Signed)
 GI Inpatient Follow-up Note  Subjective:  Patient seen in follow-up for GIB. Overnight there were concerned for bleeding. She passed bright red blood per rectum with dark red blood and clots ~0300. STAT CTA overnight was negative. 0530 patient had another episode of hematochezia with clots. She was transfused 1 unit pRBCs. Hemoglobin has remained stable, last 7.6. Family at bedside. Patient denies any pain. No further episodes of hematochezia since 0530.   Scheduled Inpatient Medications:   sodium chloride   Intravenous Once   feeding supplement  1 Container Oral TID BM   insulin aspart  0-9 Units Subcutaneous TID WC   levothyroxine  88 mcg Oral Q0600   metoprolol succinate  50 mg Oral Daily   sodium chloride flush  3 mL Intravenous Q12H    Continuous Inpatient Infusions:    sodium chloride      PRN Inpatient Medications:  acetaminophen **OR** acetaminophen, ondansetron **OR** ondansetron (ZOFRAN) IV, sodium chloride  Review of Systems: Constitutional: Weight is stable.  Eyes: No changes in vision. ENT: No oral lesions, sore throat.  GI: see HPI.  Heme/Lymph: No easy bruising.  CV: No chest pain.  GU: No hematuria.  Integumentary: No rashes.  Neuro: No headaches.  Psych: No depression/anxiety.  Endocrine: No heat/cold intolerance.  Allergic/Immunologic: No urticaria.  Resp: No cough, SOB.  Musculoskeletal: No joint swelling.    Physical Examination: BP 118/85 (BP Location: Left Arm)   Pulse (!) 117   Temp 98.2 F (36.8 C) (Oral)   Resp 18   Ht 5\' 4"  (1.626 m)   Wt 54.9 kg   SpO2 100%   BMI 20.78 kg/m  Gen: NAD, alert and oriented x 4 HEENT: PEERLA, EOMI, Neck: supple, no JVD or thyromegaly Chest: CTA bilaterally, no wheezes, crackles, or other adventitious sounds CV: RRR, no m/g/c/r Abd: soft, NT, ND, +BS in all four quadrants; no HSM, guarding, ridigity, or rebound tenderness Ext: no edema, well perfused with 2+ pulses, Skin: no rash or lesions  noted Lymph: no LAD  Data: Lab Results  Component Value Date   WBC 5.9 09/18/2023   HGB 7.6 (L) 09/19/2023   HCT 23.8 (L) 09/18/2023   MCV 91.5 09/18/2023   PLT 248 09/18/2023   Recent Labs  Lab 09/17/23 0547 09/18/23 0423 09/19/23 0324  HGB 8.0* 7.8* 7.6*   Lab Results  Component Value Date   NA 138 09/18/2023   K 3.9 09/18/2023   CL 108 09/18/2023   CO2 21 (L) 09/18/2023   BUN 18 09/18/2023   CREATININE 0.77 09/18/2023   Lab Results  Component Value Date   ALT 124 (H) 09/16/2023   AST 86 (H) 09/16/2023   ALKPHOS 237 (H) 09/16/2023   BILITOT 0.5 09/16/2023   Recent Labs  Lab 09/15/23 1245  APTT 27  INR 1.4*   CTA abd/pelvis w/wo contrast 09/19/2023: IMPRESSION: 1. No evidence of active GI bleeding. No specific, localized source of bleeding. There is extensive colonic diverticulosis. 2. Marked biliary and pancreatic duct dilatation since 04/05/2023. No underlying obstructive lesion is seen, correlate with hepatobiliary labs. 3. Cholelithiasis. Nodular thickening at the fundus of the gallbladder could reflect adenomyomatosis or gallbladder mass. 4. Multiple chronic findings as described.  EGD 09/18/2023 - normal esophagus, 1 cm hiatal hernia, gastritis, normal examined duodenum   Flex Sig 09/18/2023 - mild diverticulosis in sigmoid colon with no evidence of diverticular bleeding, non-bleeding Grade I IH  Assessment/Plan:  89 y/o AA female with a PMH of HTN, hypothyroidism, T2DM, hx  of CVA, and PAF on chronic anticoagulation (being held) admitted 3/10 for ABLA/GIB  Hematochezia/Melena/ABLA - s/p EGD and flex sig yesterday negative for active bleeding. Overnight patient had concerns of hematochezia with subsequent negative CTA. S/p 1 unit pRBCs. H&H stable. Suspect LGIB 2/2 diverticular bleed.   PAF/Atrial flutter - Eliquis being held  Advanced age  T2DM  Hx of CVA  Recommendations:  - Maintain 2 large bore IVs for access - Continue to monitor serial  H&H. Transfuse for Hgb <7.0.  - Supportive care per primary team  - CTA this morning negative. No overt GI bleeding at present time.  - If she has more episodes of hematochezia, recommend tagged RBC scan and consult vascular surgery if positive.  - Continue to hold Eliquis and all antiplatelet therapy for now - OK to continue full liquid diet - If there are concerns for bleeding, please call Dr. Norma Fredrickson today. Dr. Mia Creek will be following over the weekend should any concerns arise.  - Plan of care was reviewed with patient and POA Dallie Dad) who are in agreement   Please call with questions or concerns.   Jacob Moores, PA-C [facility] [phone]

## 2023-09-19 NOTE — Progress Notes (Signed)
   09/19/23 0737  Assess: MEWS Score  Temp 98.3 F (36.8 C)  BP 133/87  MAP (mmHg) 100  Pulse Rate (!) 112  Resp 18  SpO2 100 %  O2 Device Room Air  Assess: MEWS Score  MEWS Temp 0  MEWS Systolic 0  MEWS Pulse 2  MEWS RR 0  MEWS LOC 0  MEWS Score 2  MEWS Score Color Yellow  Assess: if the MEWS score is Yellow or Red  Were vital signs accurate and taken at a resting state? Yes  Does the patient meet 2 or more of the SIRS criteria? No  MEWS guidelines implemented  Yes, yellow  Treat  MEWS Interventions Considered administering scheduled or prn medications/treatments as ordered  Take Vital Signs  Increase Vital Sign Frequency  Yellow: Q2hr x1, continue Q4hrs until patient remains green for 12hrs  Escalate  MEWS: Escalate Yellow: Discuss with charge nurse and consider notifying provider and/or RRT  Notify: Charge Nurse/RN  Name of Charge Nurse/RN Notified Velvet, RN  Provider Notification  Provider Name/Title Lai MD  Date Provider Notified 09/19/23  Time Provider Notified 0745  Method of Notification Page  Notification Reason Other (Comment) (Yellow MEWS)  Provider response No new orders  Date of Provider Response 09/19/23  Time of Provider Response 0750  Assess: SIRS CRITERIA  SIRS Temperature  0  SIRS Respirations  0  SIRS Pulse 1  SIRS WBC 0  SIRS Score Sum  1

## 2023-09-19 NOTE — Consult Note (Signed)
 [facility] Vascular Consult Note  MRN : [id number]  Barbara Butler is a 89 y.o. (1929/07/19) female who presents with chief complaint of  Chief Complaint  Patient presents with   Rectal Bleeding  .  History of Present Illness: The patient developed hematochezia on Monday and presented to the Hospital and was admitted.  She underwent CTA with no finding of active bleeding and patent IMA.  She had no more hematochezia until 8 am today (Friday).  She has had two more large bloody BM since (1 pm and 2 pm).  She has been transfused 2 units of PRBC.  BP is OK but HR (chronic Afib) is 140.  She feels tired.  She has dementia and her daughter (present in room) is POA.  No prior history.  On Eliquis for AF held since Monday.  Current Facility-Administered Medications  Medication Dose Route Frequency Provider Last Rate Last Admin   0.9 %  sodium chloride infusion (Manually program via Guardrails IV Fluids)   Intravenous Once Darlin Priestly, MD       acetaminophen (TYLENOL) tablet 650 mg  650 mg Oral Q6H PRN Verdene Lennert, MD       Or   acetaminophen (TYLENOL) suppository 650 mg  650 mg Rectal Q6H PRN Verdene Lennert, MD       feeding supplement (BOOST / RESOURCE BREEZE) liquid 1 Container  1 Container Oral TID BM Darlin Priestly, MD   1 Container at 09/19/23 7829   insulin aspart (novoLOG) injection 0-9 Units  0-9 Units Subcutaneous TID WC Verdene Lennert, MD   3 Units at 09/19/23 5621   levothyroxine (SYNTHROID) tablet 88 mcg  88 mcg Oral Q0600 Verdene Lennert, MD   88 mcg at 09/19/23 0732   metoprolol succinate (TOPROL-XL) 24 hr tablet 50 mg  50 mg Oral Daily Verdene Lennert, MD   50 mg at 09/19/23 0821   ondansetron (ZOFRAN) tablet 4 mg  4 mg Oral Q6H PRN Verdene Lennert, MD       Or   ondansetron (ZOFRAN) injection 4 mg  4 mg Intravenous Q6H PRN Verdene Lennert, MD       sodium chloride 0.9 % bolus 250 mL  250 mL Intravenous Once PRN Manuela Schwartz, NP       sodium chloride  flush (NS) 0.9 % injection 3 mL  3 mL Intravenous Q12H Verdene Lennert, MD   3 mL at 09/19/23 0820    Past Medical History:  Diagnosis Date   Anemia    Atrial fibrillation (HCC)    Diabetes mellitus without complication (HCC)    diet controlled   Diabetic neuropathy (HCC)    Dysrhythmia    Hypertension    Hypothyroidism    Stroke (HCC) 01/2023   multiple strokes found at this time    Past Surgical History:  Procedure Laterality Date   CATARACT EXTRACTION W/PHACO Right 01/28/2019   Procedure: CATARACT EXTRACTION PHACO AND INTRAOCULAR LENS PLACEMENT (IOC) RIGHT DIABETES;  Surgeon: Elliot Cousin, MD;  Location: [facility] ORS;  Service: Ophthalmology;  Laterality: Right;  [place] 00:49.3 CDE 8.12 Fluid Pack Lot # [id number]   CATARACT EXTRACTION W/PHACO Left 02/25/2019   Procedure: CATARACT EXTRACTION PHACO AND INTRAOCULAR LENS PLACEMENT (IOC), LEFT, DIABETIC, VISION BLUE;  Surgeon: Elliot Cousin, MD;  Location: [facility];  Service: Ophthalmology;  Laterality: Left;  [place]  01:10 CDE 11.43 Fluid pack lot # [id number]   ESOPHAGOGASTRODUODENOSCOPY N/A 09/18/2023   Procedure: EGD (ESOPHAGOGASTRODUODENOSCOPY);  Surgeon: Norma Fredrickson, Boykin Nearing, MD;  Location: [facility];  Service: Gastroenterology;  Laterality: N/A;   FLEXIBLE SIGMOIDOSCOPY N/A 09/18/2023   Procedure: SIGMOIDOSCOPY, FLEXIBLE;  Surgeon: Toledo, Boykin Nearing, MD;  Location: [facility];  Service: Gastroenterology;  Laterality: N/A;   FRACTURE SURGERY Right 02/2023   hip   INTRAMEDULLARY (IM) NAIL INTERTROCHANTERIC Right 02/06/2023   Procedure: INTRAMEDULLARY NAILING OF RIGHT FEMUR;  Surgeon: Roby Lofts, MD;  Location: [facility];  Service: Orthopedics;  Laterality: Right;   ORIF WRIST FRACTURE Right 06/28/2016   Procedure: OPEN REDUCTION INTERNAL FIXATION (ORIF) WRIST FRACTURE;  Surgeon: Kennedy Bucker, MD;  Location: [facility] OR[facility];  Service: Orthopedics;  Laterality: Right;   SALPINGECTOMY      Social History Social History   Tobacco Use    Smoking status: Former    Current packs/day: 0.00    Types: Cigarettes    Quit date: 08/07/1958    Years since quitting: 65.1   Smokeless tobacco: Never  Substance Use Topics   Alcohol use: Not Currently   Drug use: No    Family History Family History  Problem Relation Age of Onset   Breast cancer Neg Hx     No Known Allergies   REVIEW OF SYSTEMS (Negative unless checked)  Constitutional: [] Weight loss  [] Fever  [] Chills Cardiac: [] Chest pain   [] Chest pressure   [] Palpitations   [] Shortness of breath when laying flat   [] Shortness of breath at rest   [] Shortness of breath with exertion. Vascular:  [] Pain in legs with walking   [] Pain in legs at rest   [] Pain in legs when laying flat   [] Claudication   [] Pain in feet when walking  [] Pain in feet at rest  [] Pain in feet when laying flat   [] History of DVT   [] Phlebitis   [] Swelling in legs   [] Varicose veins   [] Non-healing ulcers Pulmonary:   [] Uses home oxygen   [] Productive cough   [] Hemoptysis   [] Wheeze  [] COPD   [] Asthma Neurologic:  [] Dizziness  [] Blackouts   [] Seizures   [] History of stroke   [] History of TIA  [] Aphasia   [] Temporary blindness   [] Dysphagia   [] Weakness or numbness in arms   [] Weakness or numbness in legs Musculoskeletal:  [] Arthritis   [] Joint swelling   [] Joint pain   [] Low back pain Hematologic:  [] Easy bruising  [] Easy bleeding   [] Hypercoagulable state   [] Anemic  [] Hepatitis Gastrointestinal:  [x] Blood in stool   [] Vomiting blood  [] Gastroesophageal reflux/heartburn   [] Difficulty swallowing. Genitourinary:  [] Chronic kidney disease   [] Difficult urination  [] Frequent urination  [] Burning with urination   [] Blood in urine Skin:  [] Rashes   [] Ulcers   [] Wounds Psychological:  [] History of anxiety   []  History of major depression.  Physical Examination  Vitals:   09/19/23 1414 09/19/23 1833 09/19/23 1835 09/19/23 1848  BP: 112/74 117/82 117/82 117/73  Pulse: (!) 119 (!) 102 (!) 108 (!) 114  Resp: 19  18 (!) 24 20  Temp: 98.6 F (37 C) 98.6 F (37 C) 98.6 F (37 C) 98.7 F (37.1 C)  TempSrc: Oral Oral Oral Oral  SpO2: 100% 100% 100% 100%  Weight:      Height:       Body mass index is 20.78 kg/m. Gen:  WD/WN, NAD Head: [place]/AT, No temporalis wasting. Prominent temp pulse not noted. Ear/Nose/Throat: Hearing grossly intact, nares w/o erythema or drainage, oropharynx w/o Erythema/Exudate Eyes: Sclera non-icteric, conjunctiva clear Neck: Trachea midline.  No JVD.  Pulmonary:  Good air movement, respirations  not labored, equal bilaterally.  Cardiac: RRR, normal S1, S2. Vascular:  Vessel Right Left  Radial Palpable Palpable  Ulnar Palpable Palpable  Brachial Palpable Palpable  Carotid Palpable, without bruit Palpable, without bruit  Aorta Not palpable N/A  Femoral Palpable Palpable  Popliteal Palpable Palpable  PT Palpable Palpable  DP Palpable Palpable   Gastrointestinal: soft, non-tender/non-distended. No guarding/reflex.  Musculoskeletal: M/S 5/5 throughout.  Extremities without ischemic changes.  No deformity or atrophy. No edema. Neurologic: Sensation grossly intact in extremities.  Symmetrical.  Speech is fluent. Motor exam as listed above. Psychiatric: Judgment intact, Mood & affect appropriate for pt's clinical situation. Dermatologic: No rashes or ulcers noted.  No cellulitis or open wounds. Lymph : No Cervical, Axillary, or Inguinal lymphadenopathy.     CBC Lab Results  Component Value Date   WBC 5.9 09/18/2023   HGB 7.6 (L) 09/19/2023   HCT 23.8 (L) 09/18/2023   MCV 91.5 09/18/2023   PLT 248 09/18/2023    BMET    Component Value Date/Time   NA 138 09/18/2023 0423   K 3.9 09/18/2023 0423   CL 108 09/18/2023 0423   CO2 21 (L) 09/18/2023 0423   GLUCOSE 172 (H) 09/18/2023 0423   BUN 18 09/18/2023 0423   CREATININE 0.77 09/18/2023 0423   CALCIUM 8.9 09/18/2023 0423   GFRNONAA >60 09/18/2023 0423   Estimated Creatinine Clearance: 38.7 mL/min (by C-G  formula based on SCr of 0.77 mg/dL).  COAG Lab Results  Component Value Date   INR 1.4 (H) 09/15/2023   INR 1.4 (H) 04/05/2023   INR 1.7 (H) 02/05/2023    Radiology NM GI Blood Loss Result Date: 09/19/2023 CLINICAL DATA:  Hematochezia EXAM: NUCLEAR MEDICINE GASTROINTESTINAL BLEEDING SCAN TECHNIQUE: Sequential abdominal images were obtained following intravenous administration of Tc-93m labeled red blood cells. RADIOPHARMACEUTICALS:  20.52 mCi Tc-3m pertechnetate in-vitro labeled red cells. COMPARISON:  CTA 09/19/2023 FINDINGS: Towards the end of the first hour of imaging, there is a collection tagged red blood cells in the LEFT lower quadrant. More delayed imaging demonstrates advancement of this activity into the sigmoid colon. Findings consistent with active gastrointestinal bleeding into the distal descending colon/proximal sigmoid colon. IMPRESSION: Active gastrointestinal bleeding in the LEFT colon. Bleeding initiates in the distal descending colon and travels into the sigmoid colon. These results will be called to the ordering clinician or representative by the Radiologist Assistant, and communication documented in the PACS or [employer]. Electronically Signed   By: Genevive Bi M.D.   On: 09/19/2023 17:51   CT ANGIO GI BLEED Result Date: 09/19/2023 CLINICAL DATA:  Bright red blood per rectum EXAM: CTA ABDOMEN AND PELVIS WITHOUT AND WITH CONTRAST TECHNIQUE: Multidetector CT imaging of the abdomen and pelvis was performed using the standard protocol during bolus administration of intravenous contrast. Multiplanar reconstructed images and MIPs were obtained and reviewed to evaluate the vascular anatomy. RADIATION DOSE REDUCTION: This exam was performed according to the departmental dose-optimization program which includes automated exposure control, adjustment of the mA and/or kV according to patient size and/or use of iterative reconstruction technique. CONTRAST:  OMNIPAQUE  IOHEXOL 350 MG/ML SOLN COMPARISON:  04/05/2023 FINDINGS: VASCULAR Aorta: Diffuse atheromatous plaque.  No dissection or aneurysm. Celiac: Narrow for set moderate narrowing of the proximal celiac likely combination of atherosclerosis and median arcuate ligament. No branch occlusion or beading SMA: Atheromatous plaque without flow reducing stenosis proximally. No evidence of active bleeding. No evidence of angio dysplasia Renals: Atheromatous plaque on both sides. No beading, aneurysm,  or dissection. IMA: Patent Inflow: Atheromatous plaque without stenosis, dissection, or aneurysm. Proximal Outflow: Atheromatous plaque Veins: No veno occlusive disease seen in the portal circulation. Early systemic venous enhancement. Review of the MIP images confirms the above findings. NON-VASCULAR Lower chest: Cylindrical bronchiectasis at the lung bases with mild subpleural reticulation. Biatrial enlargement. Atheromatous calcification of the aorta and coronaries. Hepatobiliary: Marked intra and extrahepatic biliary dilatation without obstructing lesion seen. Cholelithiasis without signs of acute cholecystitis. Lobulated and 15 mm nodular appearance at the fundus of the gallbladder is stable and could be adenomyomatosis, finding new from 2013.There is been a significant progression of biliary dilatation since prior. Hepatic cysts which are simple, up to 4 cm in the left lobe. Pancreas: Pancreatic ductal dilatation especially distally, measuring up to nearly 7 mm. No underlying obstructive lesion is seen. The pancreas shows generalized atrophy. Spleen: Unremarkable Adrenals/Urinary Tract: Negative adrenals. No hydronephrosis or stone. Bilateral renal cystic densities. Unremarkable bladder. Stomach/Bowel: No active intraluminal bleeding is seen. There are innumerable colonic diverticula. No visible bowel inflammation or obstruction. Lymphatic: No mass or adenopathy. Reproductive:Unremarkable for age. Other: No ascites or  pneumoperitoneum. Fatty right inguinal and midline hernias. Musculoskeletal: Right femoral fixation hardware. Generalized osteopenia. There have been remote L1-L3 and L5 compression/endplate fractures. IMPRESSION: 1. No evidence of active GI bleeding. No specific, localized source of bleeding. There is extensive colonic diverticulosis. 2. Marked biliary and pancreatic duct dilatation since 04/05/2023. No underlying obstructive lesion is seen, correlate with hepatobiliary labs. 3. Cholelithiasis. Nodular thickening at the fundus of the gallbladder could reflect adenomyomatosis or gallbladder mass. 4. Multiple chronic findings as described. Electronically Signed   By: Tiburcio Pea M.D.   On: 09/19/2023 05:30   US ABDOMEN LIMITED RUQ (LIVER/GB) Result Date: 09/15/2023 CLINICAL DATA:  Elevated LFTs EXAM: ULTRASOUND ABDOMEN LIMITED RIGHT UPPER QUADRANT COMPARISON:  CTA abdomen pelvis 04/05/2023 FINDINGS: Gallbladder: Cholelithiasis. No gallbladder wall thickening or pericholecystic fluid. Negative sonographic Murphy's sign. Common bile duct: Diameter: 18 mm, dilated. Intrahepatic biliary ductal dilatation is noted. Liver: There is a 4.1 cm hepatic cyst. Mild increased hepatic parenchymal echogenicity. Portal vein is patent on color Doppler imaging with normal direction of blood flow towards the liver. Other: None. IMPRESSION: 1. Cholelithiasis without secondary signs of acute cholecystitis. 2. Dilated common bile duct measuring up to 18 mm. Intrahepatic biliary ductal dilatation is noted. Recommend correlation with LFTs. If there is concern for biliary obstruction, recommend MRI/MRCP. 3. Mild increased hepatic parenchymal echogenicity suggestive of steatosis. Electronically Signed   By: Annia Belt M.D.   On: 09/15/2023 19:35      Assessment/Plan 1. Hematochezia with Nuc Med localizing to distal descending colon into the sigmoid.  Patient denies prior knowledge of diverticulosis.  Cancer is always possible.   Last hgb was in the 7 range and 2 units have been given since.  Admitting hgb was 11. 2. Gallstones and dilated ducts   I have discussed the plan with the daughter and the patient.  Plan is embolization of the IMA branches.  The issues of too much and too little embolization and issues including bowel infarction, persistent/recurrent bleeding , death, renal injury, vascular injury, etc were discussed and accepted.  Unfortunately she is on her third unit of blood and has recurrent bleeding despite the absence of Eliquis for 4 days.  I prefer her to go to the ICU post procedure.  The daughter signed the consent and all questions were answered to the satisfaction of all.  Plan to proceed ASAP.  Iline Oven, MD  09/19/2023 7:16 PM

## 2023-09-19 NOTE — Progress Notes (Signed)
   09/19/23 1414  Assess: MEWS Score  Temp 98.6 F (37 C)  BP 112/74  MAP (mmHg) 84  Pulse Rate (!) 119  Resp 19  SpO2 100 %  O2 Device Room Air  Assess: MEWS Score  MEWS Temp 0  MEWS Systolic 0  MEWS Pulse 2  MEWS RR 0  MEWS LOC 0  MEWS Score 2  MEWS Score Color Yellow  Assess: if the MEWS score is Yellow or Red  Were vital signs accurate and taken at a resting state? Yes  Does the patient meet 2 or more of the SIRS criteria? No  MEWS guidelines implemented  Yes, yellow  Treat  MEWS Interventions Considered administering scheduled or prn medications/treatments as ordered  Take Vital Signs  Increase Vital Sign Frequency  Yellow: Q2hr x1, continue Q4hrs until patient remains green for 12hrs  Escalate  MEWS: Escalate Yellow: Discuss with charge nurse and consider notifying provider and/or RRT  Notify: Charge Nurse/RN  Name of Charge Nurse/RN Notified Velvet, RN  Provider Notification  Provider Name/Title Lai MD  Date Provider Notified 09/19/23  Time Provider Notified 1415  Method of Notification Page  Notification Reason Other (Comment) (Back to Yellow MEWS)  Provider response See new orders;Other (Comment) (Pt going for Nuc Med scan)  Date of Provider Response 09/19/23  Time of Provider Response 1415  Assess: SIRS CRITERIA  SIRS Temperature  0  SIRS Respirations  0  SIRS Pulse 1  SIRS WBC 0  SIRS Score Sum  1

## 2023-09-20 DIAGNOSIS — K922 Gastrointestinal hemorrhage, unspecified: Secondary | ICD-10-CM | POA: Diagnosis not present

## 2023-09-20 LAB — GLUCOSE, CAPILLARY
Glucose-Capillary: 182 mg/dL — ABNORMAL HIGH (ref 70–99)
Glucose-Capillary: 224 mg/dL — ABNORMAL HIGH (ref 70–99)
Glucose-Capillary: 263 mg/dL — ABNORMAL HIGH (ref 70–99)
Glucose-Capillary: 278 mg/dL — ABNORMAL HIGH (ref 70–99)

## 2023-09-20 LAB — HEMOGLOBIN AND HEMATOCRIT, BLOOD
HCT: 26.3 % — ABNORMAL LOW (ref 36.0–46.0)
Hemoglobin: 8.9 g/dL — ABNORMAL LOW (ref 12.0–15.0)

## 2023-09-20 MED ORDER — HYDRALAZINE HCL 50 MG PO TABS
50.0000 mg | ORAL_TABLET | Freq: Two times a day (BID) | ORAL | Status: DC
  Administered 2023-09-20 – 2023-09-22 (×5): 50 mg via ORAL
  Filled 2023-09-20 (×5): qty 1

## 2023-09-20 NOTE — Progress Notes (Signed)
  PROGRESS NOTE    Barbara Butler  XLK:[id number] DOB: [birth year] DOA: 09/15/2023 PCP: [name], MD  [id number]  LOS: 1 day   Brief hospital course:   Assessment & Plan: Barbara Butler is a 89 y.o. female with medical history significant of atrial fibrillation on Eliquis, hypertension, type 2 diabetes mellitus, CVA,  hypothyroidism, who presents to the ED due to fresh red blood per rectum.  CT scan showed multiple diverticulosis without diverticulitis.   Eliquis was discontinued, patient monitored for hemoglobin.  # GI bleed, diverticular bleed --both the EGD and sigmoidoscopy performed revealed no active bleeding. --multiple large bloody BM's on 3/14.  Nuclear scan pos for Active gastrointestinal bleeding in the LEFT colon.  Vascular surgery embolized.  No more bleeding since.  Acute blood loss anemia. --Hgb dropped by 3g. 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   Atrial flutter (HCC) History of CVA. --cont Toprol --hold Eliquis   Hypothyroidism - Continue home Synthroid   Hypertension --cont Toprol --resume hydralazine --hold amlodipine   Type 2 diabetes mellitus with hyperglycemia, without long-term current use of insulin (HCC) Last A1c of 7.8% on 09/05/2023. --ACHS and SSI     DVT prophylaxis: SCD/Compression stockings Code Status: Full code  Family Communication: family updated at bedside today Level of care: Med-Surg Dispo:   The patient is from: home Anticipated d/c is to: home Anticipated d/c date is: Monday   Subjective and Interval History:  Pt did well after embolization.  Felt better today, no more bloody BM.   Objective: Vitals:   09/20/23 1359 09/20/23 1400 09/20/23 1500 09/20/23 1600  BP:  118/81 121/77 (!) 135/90  Pulse:  76    Resp: (!) 27 (!) 29 (!) 23 14  Temp:    99.1 F (37.3 C)   TempSrc:    Oral  SpO2: 100% 100% 100% 99%  Weight:      Height:        Intake/Output Summary (Last 24 hours) at 09/20/2023 1629 Last data filed at 09/20/2023 1600 Gross per 24 hour  Intake 991.58 ml  Output 350 ml  Net 641.58 ml   Filed Weights   09/15/23 1144 09/18/23 0357 09/20/23 0500  Weight: 54.4 kg 54.9 kg 52.1 kg    Examination:   Constitutional: NAD, alert HEENT: conjunctivae and lids normal, EOMI CV: No cyanosis.   RESP: normal respiratory effort, on RA Neuro: II - XII grossly intact.   Psych: better mood and affect.     Data Reviewed: I have personally reviewed labs and imaging studies  Time spent: 35 minutes  Darlin Priestly, MD [facility] If 7PM-7AM, please contact night-coverage 09/20/2023, 4:29 PM

## 2023-09-20 NOTE — Progress Notes (Signed)
 [facility] [place] [facility]  Daily Progress Note   Subjective  -   No complaints.  Feels well.  Denies any BM since OR yesterday.  No abdominal complaints.  No arm complaints.  Objective Vitals:   09/20/23 1000 09/20/23 1100 09/20/23 1200 09/20/23 1227  BP: 128/83 (!) 141/89 135/78 135/78  Pulse: 78 94 74   Resp: 17 17 (!) 26 (!) 26  Temp:   97.9 F (36.6 C)   TempSrc:   Oral   SpO2: 100% 99% 100%   Weight:      Height:        Intake/Output Summary (Last 24 hours) at 09/20/2023 1303 Last data filed at 09/20/2023 1200 Gross per 24 hour  Intake 491.58 ml  Output 300 ml  Net 191.58 ml    PULM  CTAB CV  RRR VASC  Palpable left brachial pulse and no hematoma/ecchymosis.  Normal motor and sensory and warm intact hand.  Soft abdomen with no tenderness or distention.  Laboratory CBC    Component Value Date/Time   WBC 5.9 09/18/2023 0423   HGB 8.9 (L) 09/20/2023 0330   HCT 26.3 (L) 09/20/2023 0330   PLT 248 09/18/2023 0423    BMET    Component Value Date/Time   NA 138 09/18/2023 0423   K 3.9 09/18/2023 0423   CL 108 09/18/2023 0423   CO2 21 (L) 09/18/2023 0423   GLUCOSE 172 (H) 09/18/2023 0423   BUN 18 09/18/2023 0423   CREATININE 0.77 09/18/2023 0423   CALCIUM 8.9 09/18/2023 0423   GFRNONAA >60 09/18/2023 0423    Assessment/Planning: POD #1 s/p sigmoidal artery embolization for rectal bleeding.  Doing very well.  No recent hgb since 3:30 am.  Hemodynamically stable and clinically well. Access site is fine.  Safeguard removed and no dressing required.  Iline Oven  09/20/2023, 1:03 PM

## 2023-09-21 DIAGNOSIS — K922 Gastrointestinal hemorrhage, unspecified: Secondary | ICD-10-CM | POA: Diagnosis not present

## 2023-09-21 LAB — GLUCOSE, CAPILLARY
Glucose-Capillary: 173 mg/dL — ABNORMAL HIGH (ref 70–99)
Glucose-Capillary: 317 mg/dL — ABNORMAL HIGH (ref 70–99)
Glucose-Capillary: 182 mg/dL — ABNORMAL HIGH (ref 70–99)
Glucose-Capillary: 179 mg/dL — ABNORMAL HIGH (ref 70–99)

## 2023-09-21 LAB — CBC
HCT: 24.2 % — ABNORMAL LOW (ref 36.0–46.0)
Hemoglobin: 8.2 g/dL — ABNORMAL LOW (ref 12.0–15.0)
MCH: 27.7 pg (ref 26.0–34.0)
MCHC: 33.9 g/dL (ref 30.0–36.0)
MCV: 81.8 fL (ref 80.0–100.0)
Platelets: 205 10*3/uL (ref 150–400)
RBC: 2.96 MIL/uL — ABNORMAL LOW (ref 3.87–5.11)
RDW: 20.4 % — ABNORMAL HIGH (ref 11.5–15.5)
WBC: 8.8 10*3/uL (ref 4.0–10.5)
nRBC: 1.8 % — ABNORMAL HIGH (ref 0.0–0.2)

## 2023-09-21 LAB — BASIC METABOLIC PANEL
Anion gap: 7 (ref 5–15)
BUN: 20 mg/dL (ref 8–23)
CO2: 18 mmol/L — ABNORMAL LOW (ref 22–32)
Calcium: 8.4 mg/dL — ABNORMAL LOW (ref 8.9–10.3)
Chloride: 108 mmol/L (ref 98–111)
Creatinine, Ser: 0.85 mg/dL (ref 0.44–1.00)
GFR, Estimated: >60 mL/min (ref >60)
Glucose, Bld: 229 mg/dL — ABNORMAL HIGH (ref 70–99)
Potassium: 3.5 mmol/L (ref 3.5–5.1)
Sodium: 133 mmol/L — ABNORMAL LOW (ref 135–145)

## 2023-09-21 LAB — MAGNESIUM: Magnesium: 1.9 mg/dL (ref 1.7–2.4)

## 2023-09-21 NOTE — Progress Notes (Signed)
 [facility] [facility]  Daily Progress Note   Subjective  -   No complaints and feels well.  Left arm and abdomen are specifically fine.  Objective Vitals:   09/21/23 0749 09/21/23 0800 09/21/23 0900 09/21/23 0905  BP:  131/89 123/75   Pulse:  81 (!) 52 65  Resp:  14 (!) 27 13  Temp: 98 F (36.7 C)     TempSrc: Oral     SpO2:  100% (!) 79% 100%  Weight:      Height:        Intake/Output Summary (Last 24 hours) at 09/21/2023 1044 Last data filed at 09/21/2023 0750 Gross per 24 hour  Intake 503 ml  Output 800 ml  Net -297 ml    PULM  CTAB CV  RRR VASC  Soft non-tender abdomen.  Normal arm with no sign of left brachial puncture.  Dressing removed.  Laboratory CBC    Component Value Date/Time   WBC 8.8 09/21/2023 0320   HGB 8.2 (L) 09/21/2023 0320   HCT 24.2 (L) 09/21/2023 0320   PLT 205 09/21/2023 0320    BMET    Component Value Date/Time   NA 133 (L) 09/21/2023 0320   K 3.5 09/21/2023 0320   CL 108 09/21/2023 0320   CO2 18 (L) 09/21/2023 0320   GLUCOSE 229 (H) 09/21/2023 0320   BUN 20 09/21/2023 0320   CREATININE 0.85 09/21/2023 0320   CALCIUM 8.4 (L) 09/21/2023 0320   GFRNONAA >60 09/21/2023 0320    Assessment/Planning: POD #2 s/p sigmoid artery bead embolization  No recurrent GIB.  Hgb is relatively stable (slight typical drift).  No BM reported by nursing since embolization. Continue to monitor.  Vascular to sign off.  Iline Oven  09/21/2023, 10:44 AM

## 2023-09-21 NOTE — Progress Notes (Signed)
 Report called to amber, Charity fundraiser. Patient to be transported via bed, on ra. No distress noted

## 2023-09-21 NOTE — Progress Notes (Addendum)
Telemetry discontinued per md order

## 2023-09-21 NOTE — Progress Notes (Signed)
  PROGRESS NOTE    Barbara Butler  ZOX:[id number] DOB: 1930-02-04 DOA: 09/15/2023 PCP: [name], MD  124A/124A-AA  LOS: 2 days   Brief hospital course:   Assessment & Plan: Barbara Butler is a 89 y.o. female with medical history significant of atrial fibrillation on Eliquis, hypertension, type 2 diabetes mellitus, CVA,  hypothyroidism, who presents to the ED due to fresh red blood per rectum.  CT scan showed multiple diverticulosis without diverticulitis.   Eliquis was discontinued, patient monitored for hemoglobin.  # GI bleed, diverticular bleed --both the EGD and sigmoidoscopy performed revealed no active bleeding. --multiple large bloody BM's on 3/14.  Nuclear scan pos for Active gastrointestinal bleeding in the LEFT colon.  Vascular surgery embolized.  No more bleeding since.  Acute blood loss anemia. --Hgb dropped from 12.3 to 7's. 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   Atrial flutter (HCC) History of CVA. --cont Toprol --hold Eliquis for now   Hypothyroidism - Continue home Synthroid   Hypertension --cont Toprol and hydralazine --hold amlodipine   Type 2 diabetes mellitus with hyperglycemia, without long-term current use of insulin (HCC) Last A1c of 7.8% on 09/05/2023. --ACHS and SSI     DVT prophylaxis: SCD/Compression stockings Code Status: Full code  Family Communication: family updated at bedside today Level of care: Med-Surg Dispo:   The patient is from: home Anticipated d/c is to: home Anticipated d/c date is: Monday   Subjective and Interval History:  Pt reported doing well.  No more bloody BM.   Objective: Vitals:   09/21/23 1000 09/21/23 1121 09/21/23 1200 09/21/23 1232  BP: 121/76  122/86 104/84  Pulse: (!) 34  (!) 101 89  Resp: (!) 25  17 18   Temp:  98.3 F (36.8 C)  98.6 F (37 C)   TempSrc:  Oral    SpO2: 100%  100% 100%  Weight:      Height:        Intake/Output Summary (Last 24 hours) at 09/21/2023 1444 Last data filed at 09/21/2023 1121 Gross per 24 hour  Intake 503 ml  Output 650 ml  Net -147 ml   Filed Weights   09/18/23 0357 09/20/23 0500 09/21/23 0500  Weight: 54.9 kg 52.1 kg 53 kg    Examination:   Constitutional: NAD, alert HEENT: conjunctivae and lids normal, EOMI CV: No cyanosis.   RESP: normal respiratory effort, on RA Neuro: II - XII grossly intact.   Psych: better mood and affect.     Data Reviewed: I have personally reviewed labs and imaging studies  Time spent: 35 minutes  Darlin Priestly, MD [facility] If 7PM-7AM, please contact night-coverage 09/21/2023, 2:44 PM

## 2023-09-22 ENCOUNTER — Encounter: Payer: Self-pay | Admitting: Vascular Surgery

## 2023-09-22 DIAGNOSIS — K922 Gastrointestinal hemorrhage, unspecified: Secondary | ICD-10-CM | POA: Diagnosis not present

## 2023-09-22 LAB — GLUCOSE, CAPILLARY
Glucose-Capillary: 234 mg/dL — ABNORMAL HIGH (ref 70–99)
Glucose-Capillary: 224 mg/dL — ABNORMAL HIGH (ref 70–99)
Glucose-Capillary: 345 mg/dL — ABNORMAL HIGH (ref 70–99)

## 2023-09-22 LAB — HEMOGLOBIN AND HEMATOCRIT, BLOOD
HCT: 25 % — ABNORMAL LOW (ref 36.0–46.0)
Hemoglobin: 8.4 g/dL — ABNORMAL LOW (ref 12.0–15.0)

## 2023-09-22 MED ORDER — AMLODIPINE BESYLATE 5 MG PO TABS
5.0000 mg | ORAL_TABLET | Freq: Every day | ORAL | Status: DC
  Administered 2023-09-22: 5 mg via ORAL
  Filled 2023-09-22: qty 1

## 2023-09-22 NOTE — Care Management Important Message (Signed)
 Important Message  Patient Details  Name: Barbara Butler MRN: [id number] Date of Birth: 1929-10-15   Important Message Given:  Yes - Medicare IM     Kaianna Dolezal W, CMA 09/22/2023, 11:58 AM

## 2023-09-22 NOTE — Progress Notes (Signed)
 PT Cancellation Note  Patient Details Name: LASHANDRA ARAUZ MRN: [id number] DOB: 03-Apr-1930   Cancelled Treatment:    Reason Eval/Treat Not Completed: Other (comment): Patient supine in bed upon arrival. Patient refused participation in therapy services this AM, reports she is planned to discharge soon. Denies any question/concerns regarding discharge from therapy standpoint. Will continue to follow while admitted.    Creed Copper Fairly, PT, DPT 09/22/23 9:19 AM

## 2023-09-22 NOTE — Plan of Care (Signed)
  Problem: Education: Goal: Ability to describe self-care measures that may prevent or decrease complications (Diabetes Survival Skills Education) will improve Outcome: Progressing   Problem: Coping: Goal: Ability to adjust to condition or change in health will improve Outcome: Progressing   Problem: Fluid Volume: Goal: Ability to maintain a balanced intake and output will improve Outcome: Progressing   Problem: Metabolic: Goal: Ability to maintain appropriate glucose levels will improve Outcome: Progressing   Problem: Nutritional: Goal: Maintenance of adequate nutrition will improve Outcome: Progressing   Problem: Skin Integrity: Goal: Risk for impaired skin integrity will decrease Outcome: Progressing   Problem: Clinical Measurements: Goal: Ability to maintain clinical measurements within normal limits will improve Outcome: Progressing Goal: Will remain free from infection Outcome: Progressing   Problem: Pain Managment: Goal: General experience of comfort will improve and/or be controlled Outcome: Progressing

## 2023-09-22 NOTE — Discharge Summary (Signed)
 Physician Discharge Summary   Barbara Butler  female DOB: August 30, 1929  ZOX:[id number]  PCP: [name], MD  Admit date: 09/15/2023 Discharge date: 09/22/2023  Admitted From: home Disposition:  home Home Health: Yes CODE STATUS: Full code  Discharge Instructions     No wound care   Complete by: As directed       Hospital Course:  For full details, please see H&P, progress notes, consult notes and ancillary notes.  Briefly,  Barbara Butler is a 89 y.o. female with medical history significant of atrial fibrillation on Eliquis, hypertension, type 2 diabetes mellitus, CVA, hypothyroidism, who presented to the ED due to fresh red blood per rectum.  CT scan showed multiple diverticulosis without diverticulitis.     # GI bleed, diverticular bleed --Pt initially had melena, and both the EGD and sigmoidoscopy performed revealed no active bleeding.  However, pt started having multiple large bloody BM's on 3/14.  Nuclear scan pos for Active gastrointestinal bleeding in the LEFT colon.  Vascular surgery urgently embolized on 09/19/23.  No more bleeding since.   Acute blood loss anemia. --Hgb dropped from 12.3 to 7's. --2u pRBC on 3/14 for Hgb 7's and active GI bleed.  Hgb 8's prior to discharge.   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   Atrial flutter (HCC) History of CVA. --cont Toprol --Eliquis held during hospitalization, to be resumed after discharge.   Hypothyroidism - Continue home Synthroid   Hypertension --cont home Toprol and hydralazine --amlodipine held during hospitalization, resumed prior to discharge.   Type 2 diabetes mellitus with hyperglycemia, without long-term current use of insulin (HCC) Last A1c of 7.8% on 09/05/2023. --received ACHS and SSI during hospitalization.  Resume home glimepiride and metformin after discharge.   Unless noted above, medications under "STOP" list are ones pt  was not taking PTA.  Discharge Diagnoses:  Principal Problem:   Lower GI bleed Active Problems:   Common bile duct dilatation   LFT elevation   Atrial flutter (HCC)   Type 2 diabetes mellitus with hyperglycemia, without long-term current use of insulin (HCC)   Hypertension   Hypothyroidism   History of CVA (cerebrovascular accident)   GI bleed   30 Day Unplanned Readmission Risk Score    Flowsheet Row ED to Hosp-Admission (Current) from 09/15/2023 in [facility] 1C MEDICAL TELEMETRY  30 Day Unplanned Readmission Risk Score (%) 18.63 Filed at 09/22/2023 0801       This score is the patient's risk of an unplanned readmission within 30 days of being discharged (0 -100%). The score is based on dignosis, age, lab data, medications, orders, and past utilization.   Low:  0-14.9   Medium: 15-21.9   High: 22-29.9   Extreme: 30 and above         Discharge Instructions:  Allergies as of 09/22/2023   No Known Allergies      Medication List     STOP taking these medications    amoxicillin 500 MG capsule Commonly known as: AMOXIL       TAKE these medications    amLODipine 5 MG tablet Commonly known as: NORVASC Take 1 tablet (5 mg total) by mouth daily.   apixaban 2.5 MG Tabs tablet Commonly known as: Eliquis Take 1 tablet (2.5 mg total) by mouth 2 (two) times daily.   aspirin EC 81 MG tablet Take 81 mg by mouth daily. Swallow whole.   CENTRUM SILVER  50+WOMEN PO Take 1 tablet by mouth daily.   furosemide 20 MG tablet Commonly known as: LASIX Take 2 tablets (40 mg total) by mouth daily.   glimepiride 1 MG tablet Commonly known as: AMARYL Take 1 mg by mouth daily.   hydrALAZINE 50 MG tablet Commonly known as: APRESOLINE Take 1 tablet (50 mg total) by mouth 2 (two) times daily.   levothyroxine 88 MCG tablet Commonly known as: SYNTHROID Take 88 mcg by mouth daily.   metFORMIN 500 MG tablet Commonly known as: GLUCOPHAGE Take 1 tablet  (500 mg total) by mouth 2 (two) times daily with a meal. What changed: when to take this   metoprolol succinate 50 MG 24 hr tablet Commonly known as: TOPROL-XL Take 1 tablet (50 mg total) by mouth daily. Take with or immediately following a meal.   polyethylene glycol 17 g packet Commonly known as: MIRALAX / GLYCOLAX Take 17 g by mouth daily as needed for mild constipation.               Durable Medical Equipment  (From admission, onward)           Start     Ordered   09/18/23 1256  For home use only DME Walker rolling  Once       Question Answer Comment  Walker: With 5 Inch Wheels   Patient needs a walker to treat with the following condition Weakness      09/18/23 1255             Follow-up Information     Hande, Vishwanath, MD Follow up in 1 week(s).   Specialty: Internal Medicine Why: Hospital follow up Contact information: [address] [place] [address] [phone]                 No Known Allergies   The results of significant diagnostics from this hospitalization (including imaging, microbiology, ancillary and laboratory) are listed below for reference.   Consultations:   Procedures/Studies: PERIPHERAL VASCULAR CATHETERIZATION Result Date: 09/22/2023 See surgical note for result.  NM GI Blood Loss Result Date: 09/19/2023 CLINICAL DATA:  Hematochezia EXAM: NUCLEAR MEDICINE GASTROINTESTINAL BLEEDING SCAN TECHNIQUE: Sequential abdominal images were obtained following intravenous administration of Tc-34m labeled red blood cells. RADIOPHARMACEUTICALS:  20.52 mCi Tc-84m pertechnetate in-vitro labeled red cells. COMPARISON:  CTA 09/19/2023 FINDINGS: Towards the end of the first hour of imaging, there is a collection tagged red blood cells in the LEFT lower quadrant. More delayed imaging demonstrates advancement of this activity into the sigmoid colon. Findings consistent with active gastrointestinal bleeding  into the distal descending colon/proximal sigmoid colon. IMPRESSION: Active gastrointestinal bleeding in the LEFT colon. Bleeding initiates in the distal descending colon and travels into the sigmoid colon. These results will be called to the ordering clinician or representative by the Radiologist Assistant, and communication documented in the PACS or [employer]. Electronically Signed   By: Genevive Bi M.D.   On: 09/19/2023 17:51   CT ANGIO GI BLEED Result Date: 09/19/2023 CLINICAL DATA:  Bright red blood per rectum EXAM: CTA ABDOMEN AND PELVIS WITHOUT AND WITH CONTRAST TECHNIQUE: Multidetector CT imaging of the abdomen and pelvis was performed using the standard protocol during bolus administration of intravenous contrast. Multiplanar reconstructed images and MIPs were obtained and reviewed to evaluate the vascular anatomy. RADIATION DOSE REDUCTION: This exam was performed according to the departmental dose-optimization program which includes automated exposure control, adjustment of the mA and/or kV according to patient  size and/or use of iterative reconstruction technique. CONTRAST:  OMNIPAQUE IOHEXOL 350 MG/ML SOLN COMPARISON:  04/05/2023 FINDINGS: VASCULAR Aorta: Diffuse atheromatous plaque.  No dissection or aneurysm. Celiac: Narrow for set moderate narrowing of the proximal celiac likely combination of atherosclerosis and median arcuate ligament. No branch occlusion or beading SMA: Atheromatous plaque without flow reducing stenosis proximally. No evidence of active bleeding. No evidence of angio dysplasia Renals: Atheromatous plaque on both sides. No beading, aneurysm, or dissection. IMA: Patent Inflow: Atheromatous plaque without stenosis, dissection, or aneurysm. Proximal Outflow: Atheromatous plaque Veins: No veno occlusive disease seen in the portal circulation. Early systemic venous enhancement. Review of the MIP images confirms the above findings. NON-VASCULAR Lower chest:  Cylindrical bronchiectasis at the lung bases with mild subpleural reticulation. Biatrial enlargement. Atheromatous calcification of the aorta and coronaries. Hepatobiliary: Marked intra and extrahepatic biliary dilatation without obstructing lesion seen. Cholelithiasis without signs of acute cholecystitis. Lobulated and 15 mm nodular appearance at the fundus of the gallbladder is stable and could be adenomyomatosis, finding new from 2013.There is been a significant progression of biliary dilatation since prior. Hepatic cysts which are simple, up to 4 cm in the left lobe. Pancreas: Pancreatic ductal dilatation especially distally, measuring up to nearly 7 mm. No underlying obstructive lesion is seen. The pancreas shows generalized atrophy. Spleen: Unremarkable Adrenals/Urinary Tract: Negative adrenals. No hydronephrosis or stone. Bilateral renal cystic densities. Unremarkable bladder. Stomach/Bowel: No active intraluminal bleeding is seen. There are innumerable colonic diverticula. No visible bowel inflammation or obstruction. Lymphatic: No mass or adenopathy. Reproductive:Unremarkable for age. Other: No ascites or pneumoperitoneum. Fatty right inguinal and midline hernias. Musculoskeletal: Right femoral fixation hardware. Generalized osteopenia. There have been remote L1-L3 and L5 compression/endplate fractures. IMPRESSION: 1. No evidence of active GI bleeding. No specific, localized source of bleeding. There is extensive colonic diverticulosis. 2. Marked biliary and pancreatic duct dilatation since 04/05/2023. No underlying obstructive lesion is seen, correlate with hepatobiliary labs. 3. Cholelithiasis. Nodular thickening at the fundus of the gallbladder could reflect adenomyomatosis or gallbladder mass. 4. Multiple chronic findings as described. Electronically Signed   By: Tiburcio Pea M.D.   On: 09/19/2023 05:30   US ABDOMEN LIMITED RUQ (LIVER/GB) Result Date: 09/15/2023 CLINICAL DATA:  Elevated LFTs EXAM:  ULTRASOUND ABDOMEN LIMITED RIGHT UPPER QUADRANT COMPARISON:  CTA abdomen pelvis 04/05/2023 FINDINGS: Gallbladder: Cholelithiasis. No gallbladder wall thickening or pericholecystic fluid. Negative sonographic Murphy's sign. Common bile duct: Diameter: 18 mm, dilated. Intrahepatic biliary ductal dilatation is noted. Liver: There is a 4.1 cm hepatic cyst. Mild increased hepatic parenchymal echogenicity. Portal vein is patent on color Doppler imaging with normal direction of blood flow towards the liver. Other: None. IMPRESSION: 1. Cholelithiasis without secondary signs of acute cholecystitis. 2. Dilated common bile duct measuring up to 18 mm. Intrahepatic biliary ductal dilatation is noted. Recommend correlation with LFTs. If there is concern for biliary obstruction, recommend MRI/MRCP. 3. Mild increased hepatic parenchymal echogenicity suggestive of steatosis. Electronically Signed   By: Annia Belt M.D.   On: 09/15/2023 19:35      Labs: BNP (last 3 results) No results for input(s): "BNP" in the last 8760 hours. Basic Metabolic Panel: Recent Labs  Lab 09/15/23 1159 09/16/23 0519 09/18/23 0423 09/21/23 0320  NA 138 135 138 133*  K 3.9 4.0 3.9 3.5  CL 104 105 108 108  CO2 22 21* 21* 18*  GLUCOSE 216* 213* 172* 229*  BUN 18 33* 18 20  CREATININE 0.87 1.06* 0.77 0.85  CALCIUM 9.5 9.1 8.9  8.4*  MG  --   --  2.0 1.9   Liver Function Tests: Recent Labs  Lab 09/15/23 1159 09/16/23 0519  AST 230* 86*  ALT 206* 124*  ALKPHOS 346* 237*  BILITOT 0.8 0.5  PROT 7.5 6.0*  ALBUMIN 4.2 3.3*   No results for input(s): "LIPASE", "AMYLASE" in the last 168 hours. No results for input(s): "AMMONIA" in the last 168 hours. CBC: Recent Labs  Lab 09/15/23 1159 09/16/23 0519 09/16/23 1237 09/18/23 0423 09/19/23 0324 09/20/23 0330 09/21/23 0320 09/22/23 0306  WBC 6.4 7.1  --  5.9  --   --  8.8  --   HGB 12.3 9.0*   < > 7.8* 7.6* 8.9* 8.2* 8.4*  HCT 37.7 27.0*  --  23.8*  --  26.3* 24.2* 25.0*   MCV 89.5 89.4  --  91.5  --   --  81.8  --   PLT 330 254  --  248  --   --  205  --    < > = values in this interval not displayed.   Cardiac Enzymes: No results for input(s): "CKTOTAL", "CKMB", "CKMBINDEX", "TROPONINI" in the last 168 hours. BNP: Invalid input(s): "POCBNP" CBG: Recent Labs  Lab 09/21/23 1113 09/21/23 1630 09/21/23 2057 09/22/23 0505 09/22/23 0802  GLUCAP 317* 182* 179* 234* 224*   D-Dimer No results for input(s): "DDIMER" in the last 72 hours. Hgb A1c No results for input(s): "HGBA1C" in the last 72 hours. Lipid Profile No results for input(s): "CHOL", "HDL", "LDLCALC", "TRIG", "CHOLHDL", "LDLDIRECT" in the last 72 hours. Thyroid function studies No results for input(s): "TSH", "T4TOTAL", "T3FREE", "THYROIDAB" in the last 72 hours.  Invalid input(s): "FREET3" Anemia work up No results for input(s): "VITAMINB12", "FOLATE", "FERRITIN", "TIBC", "IRON", "RETICCTPCT" in the last 72 hours. Urinalysis    Component Value Date/Time   COLORURINE YELLOW (A) 06/16/2023 1612   APPEARANCEUR CLEAR (A) 06/16/2023 1612   LABSPEC 1.016 06/16/2023 1612   PHURINE 7.0 06/16/2023 1612   GLUCOSEU >=500 (A) 06/16/2023 1612   HGBUR NEGATIVE 06/16/2023 1612   BILIRUBINUR NEGATIVE 06/16/2023 1612   KETONESUR NEGATIVE 06/16/2023 1612   PROTEINUR 30 (A) 06/16/2023 1612   NITRITE NEGATIVE 06/16/2023 1612   LEUKOCYTESUR NEGATIVE 06/16/2023 1612   Sepsis Labs Recent Labs  Lab 09/15/23 1159 09/16/23 0519 09/18/23 0423 09/21/23 0320  WBC 6.4 7.1 5.9 8.8   Microbiology Recent Results (from the past 240 hours)  MRSA Next Gen by PCR, Nasal     Status: None   Collection Time: 09/19/23 10:24 PM   Specimen: Nasal Mucosa; Nasal Swab  Result Value Ref Range Status   MRSA by PCR Next Gen NOT DETECTED NOT DETECTED Final    Comment: (NOTE) The GeneXpert MRSA Assay (FDA approved for NASAL specimens only), is one component of a comprehensive MRSA colonization  surveillance program. It is not intended to diagnose MRSA infection nor to guide or monitor treatment for MRSA infections. Test performance is not FDA approved in patients less than 73 years old. Performed at [facility], [address] [place], [place] [address]      Total time spend on discharging this patient, including the last patient exam, discussing the hospital stay, instructions for ongoing care as it relates to all pertinent caregivers, as well as preparing the medical discharge records, prescriptions, and/or referrals as applicable, is 30 minutes.    Darlin Priestly, MD  [facility] 09/22/2023, 8:18 AM

## 2023-09-22 NOTE — TOC Transition Note (Signed)
 Transition of Care [facility]) - Discharge Note   Patient Details  Name: Barbara Butler MRN: [id number] Date of Birth: Jun 13, 1930  [facility] CM/SW Contact:  Truddie Hidden, RN Phone Number: 09/22/2023, 9:45 AM   Clinical Narrative:    Spoke with patient regarding therapy's recommendation for [facility]. Patient request to speak with her daughter-in-law, Dallie Dad.   Spoke with [place] regarding HH. She is agreeable and was provided choices for [facility], and [facility]. Pecolia chose [facility] and was advised [facility] would contact her for [facility] within the next week. She will transport patient home and has requested for her to be brought to the [facility]   Referral for [facility] sent and accepted by [place] from [place]   RW request sent to [place] from [employer].   TOC signing off.           Patient Goals and CMS Choice            Discharge Placement                       Discharge Plan and Services Additional resources added to the After Visit Summary for                                       Social Drivers of Health (SDOH) Interventions SDOH Screenings   Food Insecurity: No Food Insecurity (09/15/2023)  Housing: Low Risk  (09/15/2023)  Transportation Needs: No Transportation Needs (09/15/2023)  Utilities: Not At Risk (09/15/2023)  Financial Resource Strain: Low Risk  (09/05/2023)   Received from [facility]  Social Connections: Moderately Isolated (09/15/2023)  Tobacco Use: Medium Risk (09/18/2023)     Readmission Risk Interventions    02/07/2023    3:03 PM  Readmission Risk Prevention Plan  Transportation Screening Complete  PCP or Specialist Appt within 5-7 Days Complete  Home Care Screening Complete  Medication Review (RN CM) Complete

## 2023-09-23 LAB — BPAM RBC
Blood Product Expiration Date: 202503262359
Blood Product Expiration Date: 202504072359
Blood Product Expiration Date: 202504072359
ISSUE DATE / TIME: 202503140836
ISSUE DATE / TIME: 202503141825
Unit Type and Rh: 5100
Unit Type and Rh: 5100
Unit Type and Rh: 5100

## 2023-09-23 LAB — TYPE AND SCREEN
ABO/RH(D): O POS
Antibody Screen: NEGATIVE
Unit division: 0
Unit division: 0
Unit division: 0

## 2023-10-03 ENCOUNTER — Ambulatory Visit: Payer: Medicare PPO | Admitting: Medical

## 2023-10-03 ENCOUNTER — Ambulatory Visit: Attending: Medical | Admitting: Medical

## 2023-10-03 ENCOUNTER — Encounter: Payer: Self-pay | Admitting: Medical

## 2023-10-03 VITALS — BP 120/64 | HR 84 | Ht 62.0 in | Wt 126.0 lb

## 2023-10-03 DIAGNOSIS — I10 Essential (primary) hypertension: Secondary | ICD-10-CM

## 2023-10-03 DIAGNOSIS — Z8673 Personal history of transient ischemic attack (TIA), and cerebral infarction without residual deficits: Secondary | ICD-10-CM

## 2023-10-03 DIAGNOSIS — I5032 Chronic diastolic (congestive) heart failure: Secondary | ICD-10-CM

## 2023-10-03 DIAGNOSIS — I4891 Unspecified atrial fibrillation: Secondary | ICD-10-CM

## 2023-10-03 DIAGNOSIS — K922 Gastrointestinal hemorrhage, unspecified: Secondary | ICD-10-CM

## 2023-10-03 DIAGNOSIS — I4821 Permanent atrial fibrillation: Secondary | ICD-10-CM | POA: Diagnosis not present

## 2023-10-03 DIAGNOSIS — D649 Anemia, unspecified: Secondary | ICD-10-CM

## 2023-10-03 DIAGNOSIS — Z79899 Other long term (current) drug therapy: Secondary | ICD-10-CM

## 2023-10-03 NOTE — Patient Instructions (Signed)
 Medication Instructions:   Please stop Asprin.   *If you need a refill on your cardiac medications before your next appointment, please call your pharmacy*  Lab Work: Your provider would like for you to have following labs drawn today CBC.   If you have labs (blood work) drawn today and your tests are completely normal, you will receive your results only by: MyChart Message (if you have MyChart) OR A paper copy in the mail If you have any lab test that is abnormal or we need to change your treatment, we will call you to review the results.  Testing/Procedures: None ordered.    Follow-Up: At [facility], you and your health needs are our priority.  As part of our continuing mission to provide you with exceptional heart care, our providers are all part of one team.  This team includes your primary Cardiologist (physician) and Advanced Practice Providers or APPs (Physician Assistants and Nurse Practitioners) who all work together to provide you with the care you need, when you need it.  Your next appointment:   3 month(s)  Provider:   You may see Lorine Bears, MD or one of the following Advanced Practice Providers on your designated Care Team:   Nicolasa Ducking, NP Ames Dura, PA-C Eula Listen, PA-C Cadence La Cygne, PA-C Charlsie Quest, NP Carlos Levering, NP    We recommend signing up for the patient portal called "MyChart".  Sign up information is provided on this After Visit Summary.  MyChart is used to connect with patients for Virtual Visits (Telemedicine).  Patients are able to view lab/test results, encounter notes, upcoming appointments, etc.  Non-urgent messages can be sent to your provider as well.   To learn more about what you can do with MyChart, go to [email or web address].

## 2023-10-03 NOTE — Progress Notes (Signed)
 Cardiology Office Note:  .   Date:  10/06/2023  ID:  Barbara Butler, DOB [birth year], MRN [id number] PCP: [name], MD  [facility] Providers Cardiologist:  Lorine Bears, MD     History of Present Illness: .   Barbara Butler is a 89 y.o. female with a hx of hypertension, hyperlipidemia, diabetes type 2, hypothyroidism, anemia, Afib on Eliquis,  and stroke who is being seen for hospital follow-up.   Patient presented to [facility] ED 01/26/2023 for altered mental status.  In the ER she was noted to be in A-fib/flutter with heart rate of 97 bpm.  Imaging of the head showed acute to subacute ischemic infarcts in the occipital lobes and posterior splenium.  CHA2DS2-VASc of 8.  She was started on Eliquis 5 mg twice daily.  Echo showed LVEF 70 to 75%, moderate LVH, severely dilated left atrium.  She was started on Toprol 25 mg daily.  Plan was to continue rate control given severely dilated left atrium, advanced age and asymptomatic. She was discharged to rehab. At rehab she sustained a fall and was went to the ER.    The patient was subsequently admitted 02/05/23-02/11/23 for a fall found to have a closed right hip fracture and right humerus fracture. She underwent right hip repair and was eventually discharged back to the rehab facility.    Patient was last seen in August 2024 reporting good progress at the rehab facility.  Plan was to pursue cardioversion, however patient wanted to pursue medical management.   Patient was recently admitted in October 2024 with severe sepsis secondary to E. coli UTI, mild rectal bleeding from hemorrhoids, hypokalemia, A-fib with controlled rates.  Patient was discharged to SNF.  Patient was seen in follow-up November 2024 and was overall feeling good.  Patient had lower leg edema and Lasix was increased for 3 days.  Patient was recently admitted in March 2025 with GI bleed, diverticular bleed, acute blood loss anemia.  Eliquis was held.  EGD and  sigmoidoscopy showed no active bleeding.  Nuclear scan positive for active GI bleeding in the left colon.  Vascular surgery urgently embolized on 09/19/2023.  No more bleeding since that time.  Today, is back on the Eliquis 2.5mg  BID. They felt amoxicilllan for UTI messed up her medications. She dnies bleeding issues. She denies chest pain, SOB, Lower leg edema, palpitations. She uses a walker and she is doing PT.    Studies Reviewed: Marland Kitchen   EKG Interpretation Date/Time:  Friday October 03 2023 13:31:16 EDT Ventricular Rate:  84 PR Interval:    QRS Duration:  110 QT Interval:  404 QTC Calculation: 477 R Axis:   -20  Text Interpretation: Atrial fibrillation Right bundle branch block Septal infarct , age undetermined When compared with ECG of 16-Jun-2023 19:21, PREVIOUS ECG IS PRESENT Confirmed by Fransico Michael, Erian Lariviere ([id number]) on 10/06/2023 1:24:32 PM    Echo 01/2023 1. Left ventricular ejection fraction, by estimation, is 70 to 75%. The  left ventricle has hyperdynamic function. The left ventricle has no  regional wall motion abnormalities. There is moderate asymmetric left  ventricular hypertrophy of the basal-septal   segment. Left ventricular diastolic parameters are indeterminate.   2. Right ventricular systolic function is normal. The right ventricular  size is normal. There is normal pulmonary artery systolic pressure.   3. Left atrial size was severely dilated.   4. Right atrial size was moderately dilated.   5. The mitral valve is degenerative. Mild to moderate mitral valve  regurgitation. No evidence of mitral stenosis. Moderate mitral annular  calcification.   6. Tricuspid valve regurgitation is moderate.   7. The aortic valve is calcified. Aortic valve regurgitation is mild.  Aortic valve sclerosis/calcification is present, without any evidence of  aortic stenosis.   8. The inferior vena cava is normal in size with greater than 50%  respiratory variability, suggesting right atrial  pressure of 3 mmHg.    Physical Exam:   VS:  BP 120/64 (BP Location: Left Arm, Patient Position: Sitting, Cuff Size: Normal)   Pulse 84   Ht 5\' 2"  (1.575 m)   Wt 126 lb (57.2 kg)   SpO2 95%   BMI 23.05 kg/m    Wt Readings from Last 3 Encounters:  10/03/23 126 lb (57.2 kg)  09/22/23 121 lb 14.6 oz (55.3 kg)  06/28/23 130 lb (59 kg)    GEN: Well nourished, well developed in no acute distress NECK: No JVD; No carotid bruits CARDIAC: Irreg Irreg, no murmurs, rubs, gallops RESPIRATORY:  Clear to auscultation without rales, wheezing or rhonchi  ABDOMEN: Soft, non-tender, non-distended EXTREMITIES:  No edema; No deformity   ASSESSMENT AND PLAN: .    Permanent Afib Diagnosed during admission for stroke in 01/2023. Plan for rate control given age, dilated atrium, asymptomatic, and patient declined cardioversion. Eliquis held for recent admission for acute anemia/GIB. On discharge she was restarted on Eliquis 2.5mg  BID. She denies bleeding issues. She is in rate controlled Afib on EKG. I will stop ASA given Eliquis and recent bleed. CBC today.  Acute Anemia GIB/diverticular bleed Hgb dropped from 12>7.6. She was transfused 2 units PRBCs. EGD and sigmoidoscopy showed no active bleeding.  Nuclear scan positive for active GI bleeding in the left colon.  Vascular surgery urgently embolized on 09/19/2023. She denies further bleeding issues. CBC as above.   H/o stroke No residual deficits.   HFpEF Echo 01/2023 showed LVEF 70-75%, no Wma, mod LVH, severely dilated LA, mild to mod MR, mod TR, mild AI. The patient is euvolemic on exam. Continue Toprol.   HTN BP is normal. Continue amlodipine 5mg  daily, Hydralazine 50mg  BID, Toprol 50mg  daily.      Dispo: Follow-up in 3 months  Signed, Nella Botsford David Stall, PA-C

## 2023-10-04 LAB — CBC
Hematocrit: 31 % — ABNORMAL LOW (ref 34.0–46.6)
Hemoglobin: 9.9 g/dL — ABNORMAL LOW (ref 11.1–15.9)
MCH: 27.9 pg (ref 26.6–33.0)
MCHC: 31.9 g/dL (ref 31.5–35.7)
MCV: 87 fL (ref 79–97)
Platelets: 468 10*3/uL — ABNORMAL HIGH (ref 150–450)
RBC: 3.55 x10E6/uL — ABNORMAL LOW (ref 3.77–5.28)
RDW: 16.1 % — ABNORMAL HIGH (ref 11.7–15.4)
WBC: 5.3 10*3/uL (ref 3.4–10.8)

## 2023-11-28 ENCOUNTER — Other Ambulatory Visit: Payer: Self-pay | Admitting: Internal Medicine

## 2023-11-28 DIAGNOSIS — R7989 Other specified abnormal findings of blood chemistry: Secondary | ICD-10-CM

## 2023-11-28 DIAGNOSIS — R17 Unspecified jaundice: Secondary | ICD-10-CM

## 2023-11-28 DIAGNOSIS — K831 Obstruction of bile duct: Secondary | ICD-10-CM

## 2023-12-03 ENCOUNTER — Ambulatory Visit
Admission: RE | Admit: 2023-12-03 | Discharge: 2023-12-03 | Disposition: A | Discharge status: Home / Self Care | Source: Ambulatory Visit | Attending: Internal Medicine | Admitting: Internal Medicine

## 2023-12-03 ENCOUNTER — Other Ambulatory Visit: Payer: Self-pay | Admitting: Internal Medicine

## 2023-12-03 DIAGNOSIS — R7989 Other specified abnormal findings of blood chemistry: Secondary | ICD-10-CM

## 2023-12-03 DIAGNOSIS — R17 Unspecified jaundice: Secondary | ICD-10-CM

## 2023-12-03 DIAGNOSIS — K831 Obstruction of bile duct: Secondary | ICD-10-CM

## 2023-12-03 MED ORDER — GADOBUTROL 1 MMOL/ML IV SOLN
5.0000 mL | Freq: Once | INTRAVENOUS | Status: AC | PRN
  Administered 2023-12-03: 5 mL via INTRAVENOUS

## 2023-12-13 ENCOUNTER — Inpatient Hospital Stay
Admission: EM | Admit: 2023-12-13 | Discharge: 2023-12-18 | DRG: 637 | Disposition: A | Discharge status: Home Health | Attending: Internal Medicine | Admitting: Internal Medicine

## 2023-12-13 DIAGNOSIS — Z8719 Personal history of other diseases of the digestive system: Secondary | ICD-10-CM

## 2023-12-13 DIAGNOSIS — Z79890 Hormone replacement therapy (postmenopausal): Secondary | ICD-10-CM

## 2023-12-13 DIAGNOSIS — R17 Unspecified jaundice: Secondary | ICD-10-CM

## 2023-12-13 DIAGNOSIS — E871 Hypo-osmolality and hyponatremia: Secondary | ICD-10-CM | POA: Diagnosis present

## 2023-12-13 DIAGNOSIS — E1140 Type 2 diabetes mellitus with diabetic neuropathy, unspecified: Secondary | ICD-10-CM | POA: Diagnosis present

## 2023-12-13 DIAGNOSIS — Z9841 Cataract extraction status, right eye: Secondary | ICD-10-CM

## 2023-12-13 DIAGNOSIS — G9341 Metabolic encephalopathy: Secondary | ICD-10-CM | POA: Diagnosis present

## 2023-12-13 DIAGNOSIS — I482 Chronic atrial fibrillation, unspecified: Secondary | ICD-10-CM | POA: Diagnosis present

## 2023-12-13 DIAGNOSIS — T383X5A Adverse effect of insulin and oral hypoglycemic [antidiabetic] drugs, initial encounter: Secondary | ICD-10-CM | POA: Diagnosis present

## 2023-12-13 DIAGNOSIS — E162 Hypoglycemia, unspecified: Principal | ICD-10-CM

## 2023-12-13 DIAGNOSIS — Z961 Presence of intraocular lens: Secondary | ICD-10-CM | POA: Diagnosis present

## 2023-12-13 DIAGNOSIS — Z9842 Cataract extraction status, left eye: Secondary | ICD-10-CM

## 2023-12-13 DIAGNOSIS — R748 Abnormal levels of other serum enzymes: Secondary | ICD-10-CM

## 2023-12-13 DIAGNOSIS — E11649 Type 2 diabetes mellitus with hypoglycemia without coma: Secondary | ICD-10-CM | POA: Diagnosis not present

## 2023-12-13 DIAGNOSIS — E876 Hypokalemia: Secondary | ICD-10-CM | POA: Diagnosis present

## 2023-12-13 DIAGNOSIS — Z7984 Long term (current) use of oral hypoglycemic drugs: Secondary | ICD-10-CM

## 2023-12-13 DIAGNOSIS — T383X1A Poisoning by insulin and oral hypoglycemic [antidiabetic] drugs, accidental (unintentional), initial encounter: Secondary | ICD-10-CM | POA: Diagnosis present

## 2023-12-13 DIAGNOSIS — R791 Abnormal coagulation profile: Secondary | ICD-10-CM | POA: Diagnosis not present

## 2023-12-13 DIAGNOSIS — Z87891 Personal history of nicotine dependence: Secondary | ICD-10-CM

## 2023-12-13 DIAGNOSIS — K7689 Other specified diseases of liver: Secondary | ICD-10-CM | POA: Diagnosis present

## 2023-12-13 DIAGNOSIS — E872 Acidosis, unspecified: Secondary | ICD-10-CM | POA: Diagnosis present

## 2023-12-13 DIAGNOSIS — Z79899 Other long term (current) drug therapy: Secondary | ICD-10-CM

## 2023-12-13 DIAGNOSIS — Z7901 Long term (current) use of anticoagulants: Secondary | ICD-10-CM

## 2023-12-13 DIAGNOSIS — R54 Age-related physical debility: Secondary | ICD-10-CM | POA: Diagnosis present

## 2023-12-13 DIAGNOSIS — Z8673 Personal history of transient ischemic attack (TIA), and cerebral infarction without residual deficits: Secondary | ICD-10-CM

## 2023-12-13 DIAGNOSIS — Z515 Encounter for palliative care: Secondary | ICD-10-CM

## 2023-12-13 DIAGNOSIS — E160 Drug-induced hypoglycemia without coma: Secondary | ICD-10-CM | POA: Diagnosis present

## 2023-12-13 DIAGNOSIS — R404 Transient alteration of awareness: Secondary | ICD-10-CM

## 2023-12-13 DIAGNOSIS — K831 Obstruction of bile duct: Secondary | ICD-10-CM | POA: Diagnosis present

## 2023-12-13 DIAGNOSIS — I10 Essential (primary) hypertension: Secondary | ICD-10-CM | POA: Diagnosis present

## 2023-12-13 DIAGNOSIS — E039 Hypothyroidism, unspecified: Secondary | ICD-10-CM | POA: Diagnosis present

## 2023-12-13 LAB — CBC WITH DIFFERENTIAL/PLATELET
Abs Immature Granulocytes: 0.07 10*3/uL (ref 0.00–0.07)
Basophils Absolute: 0 10*3/uL (ref 0.0–0.1)
Basophils Relative: 0 %
Eosinophils Absolute: 0.1 10*3/uL (ref 0.0–0.5)
Eosinophils Relative: 1 %
HCT: 30.2 % — ABNORMAL LOW (ref 36.0–46.0)
Hemoglobin: 10.4 g/dL — ABNORMAL LOW (ref 12.0–15.0)
Immature Granulocytes: 1 %
Lymphocytes Relative: 7 %
Lymphs Abs: 0.5 10*3/uL — ABNORMAL LOW (ref 0.7–4.0)
MCH: 29.6 pg (ref 26.0–34.0)
MCHC: 34.4 g/dL (ref 30.0–36.0)
MCV: 86 fL (ref 80.0–100.0)
Monocytes Absolute: 0.5 10*3/uL (ref 0.1–1.0)
Monocytes Relative: 7 %
Neutro Abs: 6.5 10*3/uL (ref 1.7–7.7)
Neutrophils Relative %: 84 %
Platelets: 337 10*3/uL (ref 150–400)
RBC: 3.51 MIL/uL — ABNORMAL LOW (ref 3.87–5.11)
RDW: 19.9 % — ABNORMAL HIGH (ref 11.5–15.5)
WBC: 7.7 10*3/uL (ref 4.0–10.5)
nRBC: 0 % (ref 0.0–0.2)

## 2023-12-13 LAB — BASIC METABOLIC PANEL WITH GFR
Anion gap: 12 (ref 5–15)
BUN: 14 mg/dL (ref 8–23)
CO2: 18 mmol/L — ABNORMAL LOW (ref 22–32)
Calcium: 8.6 mg/dL — ABNORMAL LOW (ref 8.9–10.3)
Chloride: 97 mmol/L — ABNORMAL LOW (ref 98–111)
Creatinine, Ser: 0.56 mg/dL (ref 0.44–1.00)
GFR, Estimated: >60 mL/min (ref >60)
Glucose, Bld: 92 mg/dL (ref 70–99)
Potassium: 2.8 mmol/L — ABNORMAL LOW (ref 3.5–5.1)
Sodium: 127 mmol/L — ABNORMAL LOW (ref 135–145)

## 2023-12-13 LAB — CBG MONITORING, ED
Glucose-Capillary: 92 mg/dL (ref 70–99)
Glucose-Capillary: 60 mg/dL — ABNORMAL LOW (ref 70–99)

## 2023-12-13 MED ORDER — DEXTROSE 50 % IV SOLN
1.0000 | Freq: Once | INTRAVENOUS | Status: AC
  Administered 2023-12-13: 50 mL via INTRAVENOUS
  Filled 2023-12-13: qty 50

## 2023-12-13 NOTE — ED Triage Notes (Signed)
 Pt brought in by EMS from from home for hypoglycemia. Per EMS caregiver found pt unresponsive and diaphoretic. EMS gave D5.

## 2023-12-13 NOTE — ED Notes (Signed)
 Md notified of pt's sugar.  D-50 ordered.

## 2023-12-13 NOTE — ED Provider Notes (Signed)
 [facility] Provider Note    Event Date/Time   First MD Initiated Contact with Patient 12/13/23 2144     (approximate)   History   No chief complaint on file.   HPI  Barbara Butler is a 89 y.o. female with a history of type 2 diabetes, hypertension, hypothyroidism, atrial flutter on eliquis  and lower GI bleed who presents with 2 episodes of altered mental status today, once this morning and again now.  During both episodes the patient became less responsive and her blood sugar was found to be low.  This evening she became less responsive with gargling respirations and was found to have a glucose in the 50s.  The mental status normalized after she was given D10 by EMS.  Per the daughter in law, she is only on metformin  and not on insulin  or any other oral diabetes medications.  She also has a recent history of a diagnosis of liver disease this is being worked up outpatient and there is no clear cause yet.  I reviewed the past medical records; the patient was admitted to the hospitalist service in March with a diverticular GI bleed which was treated with embolization.     Physical Exam   Triage Vital Signs: ED Triage Vitals  Encounter Vitals Group     BP      Systolic BP Percentile      Diastolic BP Percentile      Pulse      Resp      Temp      Temp src      SpO2      Weight      Height      Head Circumference      Peak Flow      Pain Score      Pain Loc      Pain Education      Exclude from Growth Chart     Most recent vital signs: Vitals:   12/13/23 2152  BP: (!) 145/89  Pulse: (!) 101  Resp: 20  Temp: 97.9 F (36.6 C)  SpO2: 100%     General: Alert, oriented x 2, no distress.  CV:  Good peripheral perfusion.  Resp:  Normal effort.  Abd:  No distention.  Other:  Scleral icterus.  Moist mucous membranes.  EOMI.  PERRLA.  Normal speech.  Motor intact in all extremities.   ED Results / Procedures / Treatments   Labs (all labs  ordered are listed, but only abnormal results are displayed) Labs Reviewed  BASIC METABOLIC PANEL WITH GFR - Abnormal; Notable for the following components:      Result Value   Sodium 127 (*)    Potassium 2.8 (*)    Chloride 97 (*)    CO2 18 (*)    Calcium  8.6 (*)    All other components within normal limits  CBC WITH DIFFERENTIAL/PLATELET - Abnormal; Notable for the following components:   RBC 3.51 (*)    Hemoglobin 10.4 (*)    HCT 30.2 (*)    RDW 19.9 (*)    Lymphs Abs 0.5 (*)    All other components within normal limits  CBG MONITORING, ED - Abnormal; Notable for the following components:   Glucose-Capillary 60 (*)    All other components within normal limits  URINALYSIS, ROUTINE W REFLEX MICROSCOPIC  HEPATIC FUNCTION PANEL  MAGNESIUM   CBG MONITORING, ED  CBG MONITORING, ED  CBG MONITORING, ED  EKG    RADIOLOGY    PROCEDURES:  Critical Care performed: No  Procedures   MEDICATIONS ORDERED IN ED: Medications  dextrose  10 % infusion (has no administration in time range)  potassium chloride  SA (KLOR-CON  M) CR tablet 40 mEq (has no administration in time range)  dextrose  50 % solution 50 mL (50 mLs Intravenous Given 12/13/23 2314)     IMPRESSION / MDM / ASSESSMENT AND PLAN / ED COURSE  I reviewed the triage vital signs and the nursing notes.  89 year old female with PMH as noted above presents with recurrent hypoglycemia with decreased responsiveness during the hypoglycemic episodes.  She is now alert and back to her baseline after receiving D10.  Neurologic exam is nonfocal.  Vital signs are normal.  The etiology of the hypoglycemia is unclear given that the patient is on metformin  but not insulin  or oral hypoglycemics.  Differential diagnosis includes, but is not limited to, medication related hypoglycemia, acute infection, AKI or other metabolic disturbance.  We will obtain lab workup, monitor the blood glucose, and reassess.  Patient's presentation is  most consistent with acute presentation with potential threat to life or bodily function.  The patient is on the cardiac monitor to evaluate for evidence of arrhythmia and/or significant heart rate changes.  ----------------------------------------- 12:13 AM on 12/14/2023 -----------------------------------------  BMP shows hyponatremia.  Creatinine is normal.  CBC shows no acute findings.  LFTs and magnesium  are pending.  The patient had a recurrent episode of hypoglycemia, with a blood glucose in the 60s.  She will need admission for further monitoring.  I have signed her out to the oncoming ED physician Dr. Author Board.   FINAL CLINICAL IMPRESSION(S) / ED DIAGNOSES   Final diagnoses:  Hypoglycemia     Rx / DC Orders   ED Discharge Orders     None        Note:  This document was prepared using Dragon voice recognition software and may include unintentional dictation errors.    Lind Repine, MD 12/14/23 [phone]

## 2023-12-14 ENCOUNTER — Encounter: Payer: Self-pay | Admitting: Internal Medicine

## 2023-12-14 ENCOUNTER — Other Ambulatory Visit: Payer: Self-pay

## 2023-12-14 DIAGNOSIS — E876 Hypokalemia: Secondary | ICD-10-CM | POA: Diagnosis present

## 2023-12-14 DIAGNOSIS — T383X1A Poisoning by insulin and oral hypoglycemic [antidiabetic] drugs, accidental (unintentional), initial encounter: Secondary | ICD-10-CM | POA: Diagnosis not present

## 2023-12-14 DIAGNOSIS — Z7189 Other specified counseling: Secondary | ICD-10-CM | POA: Diagnosis not present

## 2023-12-14 DIAGNOSIS — Z87891 Personal history of nicotine dependence: Secondary | ICD-10-CM | POA: Diagnosis not present

## 2023-12-14 DIAGNOSIS — R748 Abnormal levels of other serum enzymes: Secondary | ICD-10-CM | POA: Diagnosis not present

## 2023-12-14 DIAGNOSIS — G9341 Metabolic encephalopathy: Secondary | ICD-10-CM | POA: Diagnosis present

## 2023-12-14 DIAGNOSIS — I10 Essential (primary) hypertension: Secondary | ICD-10-CM | POA: Diagnosis present

## 2023-12-14 DIAGNOSIS — Z7984 Long term (current) use of oral hypoglycemic drugs: Secondary | ICD-10-CM | POA: Diagnosis not present

## 2023-12-14 DIAGNOSIS — R54 Age-related physical debility: Secondary | ICD-10-CM | POA: Diagnosis present

## 2023-12-14 DIAGNOSIS — R791 Abnormal coagulation profile: Secondary | ICD-10-CM | POA: Diagnosis not present

## 2023-12-14 DIAGNOSIS — Z9841 Cataract extraction status, right eye: Secondary | ICD-10-CM | POA: Diagnosis not present

## 2023-12-14 DIAGNOSIS — Z515 Encounter for palliative care: Secondary | ICD-10-CM | POA: Diagnosis not present

## 2023-12-14 DIAGNOSIS — E11649 Type 2 diabetes mellitus with hypoglycemia without coma: Secondary | ICD-10-CM | POA: Insufficient documentation

## 2023-12-14 DIAGNOSIS — Z79899 Other long term (current) drug therapy: Secondary | ICD-10-CM | POA: Diagnosis not present

## 2023-12-14 DIAGNOSIS — Z961 Presence of intraocular lens: Secondary | ICD-10-CM | POA: Diagnosis present

## 2023-12-14 DIAGNOSIS — Z9842 Cataract extraction status, left eye: Secondary | ICD-10-CM | POA: Diagnosis not present

## 2023-12-14 DIAGNOSIS — E871 Hypo-osmolality and hyponatremia: Secondary | ICD-10-CM | POA: Diagnosis present

## 2023-12-14 DIAGNOSIS — E160 Drug-induced hypoglycemia without coma: Secondary | ICD-10-CM | POA: Diagnosis not present

## 2023-12-14 DIAGNOSIS — Z8719 Personal history of other diseases of the digestive system: Secondary | ICD-10-CM

## 2023-12-14 DIAGNOSIS — K7689 Other specified diseases of liver: Secondary | ICD-10-CM | POA: Diagnosis present

## 2023-12-14 DIAGNOSIS — K831 Obstruction of bile duct: Secondary | ICD-10-CM

## 2023-12-14 DIAGNOSIS — I482 Chronic atrial fibrillation, unspecified: Secondary | ICD-10-CM | POA: Insufficient documentation

## 2023-12-14 DIAGNOSIS — E162 Hypoglycemia, unspecified: Secondary | ICD-10-CM | POA: Diagnosis present

## 2023-12-14 DIAGNOSIS — T383X5A Adverse effect of insulin and oral hypoglycemic [antidiabetic] drugs, initial encounter: Secondary | ICD-10-CM | POA: Diagnosis present

## 2023-12-14 DIAGNOSIS — R17 Unspecified jaundice: Secondary | ICD-10-CM

## 2023-12-14 DIAGNOSIS — R404 Transient alteration of awareness: Secondary | ICD-10-CM

## 2023-12-14 DIAGNOSIS — Z8673 Personal history of transient ischemic attack (TIA), and cerebral infarction without residual deficits: Secondary | ICD-10-CM | POA: Diagnosis not present

## 2023-12-14 DIAGNOSIS — E1140 Type 2 diabetes mellitus with diabetic neuropathy, unspecified: Secondary | ICD-10-CM | POA: Diagnosis present

## 2023-12-14 DIAGNOSIS — R7989 Other specified abnormal findings of blood chemistry: Secondary | ICD-10-CM | POA: Diagnosis not present

## 2023-12-14 DIAGNOSIS — E039 Hypothyroidism, unspecified: Secondary | ICD-10-CM | POA: Diagnosis present

## 2023-12-14 DIAGNOSIS — Z7901 Long term (current) use of anticoagulants: Secondary | ICD-10-CM | POA: Diagnosis not present

## 2023-12-14 DIAGNOSIS — K838 Other specified diseases of biliary tract: Secondary | ICD-10-CM | POA: Diagnosis not present

## 2023-12-14 DIAGNOSIS — Z79890 Hormone replacement therapy (postmenopausal): Secondary | ICD-10-CM | POA: Diagnosis not present

## 2023-12-14 DIAGNOSIS — E872 Acidosis, unspecified: Secondary | ICD-10-CM | POA: Diagnosis present

## 2023-12-14 LAB — CBG MONITORING, ED
Glucose-Capillary: 109 mg/dL — ABNORMAL HIGH (ref 70–99)
Glucose-Capillary: 115 mg/dL — ABNORMAL HIGH (ref 70–99)
Glucose-Capillary: 115 mg/dL — ABNORMAL HIGH (ref 70–99)
Glucose-Capillary: 116 mg/dL — ABNORMAL HIGH (ref 70–99)
Glucose-Capillary: 117 mg/dL — ABNORMAL HIGH (ref 70–99)
Glucose-Capillary: 143 mg/dL — ABNORMAL HIGH (ref 70–99)
Glucose-Capillary: 148 mg/dL — ABNORMAL HIGH (ref 70–99)
Glucose-Capillary: 157 mg/dL — ABNORMAL HIGH (ref 70–99)
Glucose-Capillary: 158 mg/dL — ABNORMAL HIGH (ref 70–99)
Glucose-Capillary: 88 mg/dL (ref 70–99)
Glucose-Capillary: 96 mg/dL (ref 70–99)

## 2023-12-14 LAB — COMPREHENSIVE METABOLIC PANEL WITH GFR
ALT: 109 U/L — ABNORMAL HIGH (ref 0–44)
AST: 187 U/L — ABNORMAL HIGH (ref 15–41)
Albumin: 2.7 g/dL — ABNORMAL LOW (ref 3.5–5.0)
Alkaline Phosphatase: 854 U/L — ABNORMAL HIGH (ref 38–126)
Anion gap: 13 (ref 5–15)
BUN: 12 mg/dL (ref 8–23)
CO2: 18 mmol/L — ABNORMAL LOW (ref 22–32)
Calcium: 9 mg/dL (ref 8.9–10.3)
Chloride: 95 mmol/L — ABNORMAL LOW (ref 98–111)
Creatinine, Ser: 0.43 mg/dL — ABNORMAL LOW (ref 0.44–1.00)
GFR, Estimated: >60 mL/min (ref >60)
Glucose, Bld: 120 mg/dL — ABNORMAL HIGH (ref 70–99)
Potassium: 3.1 mmol/L — ABNORMAL LOW (ref 3.5–5.1)
Sodium: 126 mmol/L — ABNORMAL LOW (ref 135–145)
Total Bilirubin: 23.2 mg/dL (ref 0.0–1.2)
Total Protein: 6.8 g/dL (ref 6.5–8.1)

## 2023-12-14 LAB — HEPATIC FUNCTION PANEL
ALT: 87 U/L — ABNORMAL HIGH (ref 0–44)
AST: 161 U/L — ABNORMAL HIGH (ref 15–41)
Albumin: 2.2 g/dL — ABNORMAL LOW (ref 3.5–5.0)
Alkaline Phosphatase: 693 U/L — ABNORMAL HIGH (ref 38–126)
Bilirubin, Direct: 11.7 mg/dL — ABNORMAL HIGH (ref 0.0–0.2)
Total Bilirubin: 20.4 mg/dL (ref 0.0–1.2)
Total Protein: 5.6 g/dL — ABNORMAL LOW (ref 6.5–8.1)

## 2023-12-14 LAB — CBC
HCT: 33.8 % — ABNORMAL LOW (ref 36.0–46.0)
Hemoglobin: 11.3 g/dL — ABNORMAL LOW (ref 12.0–15.0)
MCH: 30 pg (ref 26.0–34.0)
MCHC: 33.4 g/dL (ref 30.0–36.0)
MCV: 89.7 fL (ref 80.0–100.0)
Platelets: 255 10*3/uL (ref 150–400)
RBC: 3.77 MIL/uL — ABNORMAL LOW (ref 3.87–5.11)
RDW: 20.4 % — ABNORMAL HIGH (ref 11.5–15.5)
WBC: 7.7 10*3/uL (ref 4.0–10.5)
nRBC: 0 % (ref 0.0–0.2)

## 2023-12-14 LAB — PROTIME-INR
INR: 1.5 — ABNORMAL HIGH (ref 0.8–1.2)
Prothrombin Time: 18.4 s — ABNORMAL HIGH (ref 11.4–15.2)

## 2023-12-14 LAB — URINALYSIS, ROUTINE W REFLEX MICROSCOPIC
Glucose, UA: 150 mg/dL — AB
Hgb urine dipstick: NEGATIVE
Ketones, ur: NEGATIVE mg/dL
Leukocytes,Ua: NEGATIVE
Nitrite: NEGATIVE
Protein, ur: 30 mg/dL — AB
Specific Gravity, Urine: 1.014 (ref 1.005–1.030)
pH: 5 (ref 5.0–8.0)

## 2023-12-14 LAB — GLUCOSE, CAPILLARY
Glucose-Capillary: 334 mg/dL — ABNORMAL HIGH (ref 70–99)
Glucose-Capillary: 345 mg/dL — ABNORMAL HIGH (ref 70–99)

## 2023-12-14 LAB — MAGNESIUM: Magnesium: 2 mg/dL (ref 1.7–2.4)

## 2023-12-14 MED ORDER — METOPROLOL SUCCINATE ER 50 MG PO TB24: 50.0000 mg | ORAL_TABLET | Freq: Every day | ORAL | Status: DC

## 2023-12-14 MED ORDER — DEXTROSE 10 % IV SOLN
INTRAVENOUS | Status: DC
  Filled 2023-12-14: qty 1000

## 2023-12-14 MED ORDER — INSULIN ASPART 100 UNIT/ML IJ SOLN
0.0000 [IU] | Freq: Three times a day (TID) | INTRAMUSCULAR | Status: DC
  Administered 2023-12-14: 7 [IU] via SUBCUTANEOUS
  Administered 2023-12-15: 2 [IU] via SUBCUTANEOUS
  Administered 2023-12-15: 1 [IU] via SUBCUTANEOUS
  Administered 2023-12-16 (×2): 2 [IU] via SUBCUTANEOUS
  Administered 2023-12-17: 3 [IU] via SUBCUTANEOUS
  Administered 2023-12-17: 1 [IU] via SUBCUTANEOUS
  Administered 2023-12-17: 2 [IU] via SUBCUTANEOUS
  Administered 2023-12-18: 3 [IU] via SUBCUTANEOUS
  Filled 2023-12-14 (×9): qty 1

## 2023-12-14 MED ORDER — ACETAMINOPHEN 650 MG RE SUPP: 650.0000 mg | Freq: Four times a day (QID) | RECTAL | Status: DC | PRN

## 2023-12-14 MED ORDER — ONDANSETRON HCL 4 MG PO TABS
4.0000 mg | ORAL_TABLET | Freq: Four times a day (QID) | ORAL | Status: DC | PRN
Start: 2023-12-14 — End: 2023-12-18

## 2023-12-14 MED ORDER — DEXTROSE 50 % IV SOLN: 1.0000 | INTRAVENOUS | Status: DC | PRN

## 2023-12-14 MED ORDER — POTASSIUM CHLORIDE CRYS ER 20 MEQ PO TBCR
40.0000 meq | EXTENDED_RELEASE_TABLET | Freq: Two times a day (BID) | ORAL | Status: AC
  Administered 2023-12-14 (×2): 40 meq via ORAL
  Filled 2023-12-14 (×2): qty 2

## 2023-12-14 MED ORDER — APIXABAN 2.5 MG PO TABS
2.5000 mg | ORAL_TABLET | Freq: Two times a day (BID) | ORAL | Status: DC
  Administered 2023-12-14: 2.5 mg via ORAL
  Filled 2023-12-14: qty 1

## 2023-12-14 MED ORDER — ONDANSETRON HCL 4 MG/2ML IJ SOLN
4.0000 mg | Freq: Four times a day (QID) | INTRAMUSCULAR | Status: DC | PRN
  Administered 2023-12-15: 4 mg via INTRAVENOUS

## 2023-12-14 MED ORDER — POTASSIUM CHLORIDE CRYS ER 20 MEQ PO TBCR
40.0000 meq | EXTENDED_RELEASE_TABLET | Freq: Once | ORAL | Status: AC
  Administered 2023-12-14: 40 meq via ORAL
  Filled 2023-12-14: qty 2

## 2023-12-14 MED ORDER — ACETAMINOPHEN 325 MG PO TABS
650.0000 mg | ORAL_TABLET | Freq: Four times a day (QID) | ORAL | Status: DC | PRN
Start: 2023-12-14 — End: 2023-12-18

## 2023-12-14 MED ORDER — INSULIN ASPART 100 UNIT/ML IJ SOLN: 0.0000 [IU] | Freq: Three times a day (TID) | INTRAMUSCULAR | Status: DC

## 2023-12-14 NOTE — ED Notes (Signed)
 Called ICU to give report. Left name and number for nurse to call back for report.

## 2023-12-14 NOTE — Assessment & Plan Note (Addendum)
 Possible liver failure (TB 20.4, alk phos 693, AST 161, ALT 87) Abnormal MRCP 12/03/2023 MRCP showed "unusually severe intra and extrahepatic biliary ductal dilatation, ...no obvious obstructing calculus or mass". Follow INR Holding Eliquis  GI consulted Extensive conversation with both daughter and daughter-in-law: They would like GI consult in-house but will abide by patient's wishes if she would like no further intervention Will keep n.p.o. in case ERCP can be performed Palliative care consult, discussed with daughter-in-law(patient's daughter defers to her daughter-in-law and primary caregiver for decisions)

## 2023-12-14 NOTE — Consult Note (Signed)
 Consultation Note Date: 12/14/2023   Patient Name: BROOKLYNN BRANDENBURG  DOB: [birth year]  MRN: [id number]  Age / Sex: 89 y.o., female  PCP: Antonio Baumgarten, MD Referring Physician: Read Camel, MD  Reason for Consultation: Establishing goals of care   HPI/Brief Hospital Course: 89 y.o. female  with past medical history of atrial fibrillation on Eliquis , hypertension, type 2 diabetes, multiple CVA, hypothyroidism admitted from home on 12/13/2023 with several hypoglycemic events with decreased responsiveness.  Noted recent outpatient MRCP ordered 5/28 due to in-home physical therapy noticing signs of jaundice which prompted family to be seen by PCP who ordered test Scheduled to follow-up with outpatient GI but had not been seen yet MRCP: IMPRESSION: 1. Unusually severe intra and extrahepatic biliary ductal dilatation, similar to prior examination, the common hepatic duct measuring at least 2.9 cm in caliber. This remains severely dilated to the level of the ampulla, however there is no obvious obstructing calculus or mass. 2. Severe pancreatic parenchymal atrophy with dilatation of the pancreatic duct measuring up to 1.0 cm in caliber, the duct remaining severely dilated to the ampulla, again without obvious obstructing calculus or mass. 3. Constellation of findings is of uncertain significance although presumed occult mass or stricture of the ampulla. Consider ERCP to further evaluate. 4. Cholelithiasis without evidence of acute cholecystitis. 5. Pancolonic diverticulosis. 6. Cardiomegaly.  On arrival to ED, bilirubin 23.2, AST 187 and ALT 109 GI consulted  Palliative medicine was consulted for assisting with goals of care conversations  Subjective:  Extensive chart review has been completed prior to meeting patient including labs, vital signs, imaging, progress notes, orders, and available advanced directive documents from current and previous  encounters.  Visited with Ms. Osborne at her bedside.  She is awake, alert, hard of hearing which complicates review of symptoms and goals of care conversations.  Ms. Simonich is aware that she is in the hospital.  She shares the reasoning for being in the hospital is because she had an episode of passing out due to low blood sugar.  She seems to be unaware of recent findings on imaging and having GI consulted to discuss this further testing and interventions.  No family at bedside during time of initial visit but returned later in the day when family was visiting.  Introduced myself as a Publishing rights manager as a member of the palliative care team. Explained palliative medicine is specialized medical care for people living with serious illness. It focuses on providing relief from the symptoms and stress of a serious illness. The goal is to improve quality of life for both the patient and the family.   [name]/HCPOA-Pecolia and private caregiver at bedside.  They share Ms. Gemme lives at home alone with 24/7 caregiver support.  She remains fairly independent and able to participate in completing ADLs but requires assistance with bathing and dressing and toileting.  They share she continues to be able to ambulate short distances around her home independently but requires walker assistance with longer distances.  Family shares they have an understanding of current medical situation.  Aware of abnormal results found on MRCP.  Family shares they wish to continue with ERCP if indicated by GI and felt to be appropriate to gather more information.  Family shares they would not be interested in aggressive treatment or interventions based on results of further imaging.  Discussed GI provider will need to review risks associated with ERCP with family prior to proceeding.  We discussed patient's current illness and what it means  in the larger context of patient's on-going co-morbidities. Natural disease  trajectory and expectations at EOL were discussed.   Attempted to elicit goals of care.  Family shares Ms. Wooldridge has completed living will in the past which is available in [place].  Family shares Ms. Geving has been clear she would not want long-term life preserving measures.  Family also shares recently Ms. Funderburk has shared with him that she is "tired."  We discussed CODE STATUS and the difference between full code and DO NOT RESUSCITATE. Encouraged family to consider DNR/DNI status understanding evidenced based poor outcomes in similar hospitalized patients, as the cause of the arrest is likely associated with chronic/terminal disease rather than a reversible acute cardio-pulmonary event.  Also had CODE STATUS discussion with Ms. Mcelvain which she shares she would be accepting of resuscitation.  Family shares they are concerned with her full understanding of the difference between full code and DO NOT RESUSCITATE.  Family shares they would be open to considering DNR status but not at a place to make final decision at this time.  The difference between aggressive medical intervention and comfort care was discussed.  [name] shares she is hopeful to gain insight tomorrow after speaking with GI specialist and having results of ERCP to help guide her decision making.  I discussed importance of continued conversations with family/support persons and all members of their medical team regarding overall plan of care and treatment options ensuring decisions are in alignment with patients goals of care.  All questions/concerns addressed. PMT will continue to follow and support patient as needed.  Objective: Primary Diagnoses: Present on Admission:  Hypoglycemia secondary to sulfonylurea  Acute metabolic encephalopathy  Hypothyroidism  Hypertension   Physical Exam Constitutional:      General: She is not in acute distress.    Appearance: She is ill-appearing.     Comments: HOH   Pulmonary:     Effort: Pulmonary effort is normal. No respiratory distress.  Abdominal:     General: There is distension.     Tenderness: There is no abdominal tenderness.  Skin:    Coloration: Skin is jaundiced.     Findings: Bruising present.  Neurological:     Mental Status: She is alert.     Motor: Weakness present.     Comments: Oriented to person, place and situation, intermittent confusion, disoriented to time     Vital Signs: BP (!) 150/93 (BP Location: Right Arm)   Pulse 90   Temp 98.1 F (36.7 C) (Oral)   Resp 20   SpO2 98%  Pain Scale: 0-10   Pain Score: 0-No pain  IO: Intake/output summary:  Intake/Output Summary (Last 24 hours) at 12/14/2023 1740 Last data filed at 12/14/2023 1610 Gross per 24 hour  Intake --  Output 250 ml  Net -250 ml    Assessment and Plan  SUMMARY OF RECOMMENDATIONS   Full Code remains Time for outcomes-ERCP and GI recommendations Ongoing GOC discussions needed  Thank you for this consult and allowing Palliative Medicine to participate in the care of Valory A. Verdia Glad. Palliative medicine will continue to follow and assist as needed.   Time Total: 75 minutes  Time spent includes: Detailed review of medical records (labs, imaging, vital signs), medically appropriate exam (mental status, respiratory, cardiac, skin), discussed with treatment team, counseling and educating patient, family and staff, documenting clinical information, medication management and coordination of care.   Signed by: Isadore Marble, DNP, AGNP-C Palliative Medicine    Please contact Palliative  Medicine Team phone at [phone] for questions and concerns.  For individual provider: See Tilford Foley

## 2023-12-14 NOTE — Assessment & Plan Note (Addendum)
 Acute stroke not suspected Holding apixaban  and statin due to liver dysfunction

## 2023-12-14 NOTE — ED Notes (Signed)
 Provided patient with Ice water and graham crackers per patients request.

## 2023-12-14 NOTE — Plan of Care (Signed)
   Problem: Coping: Goal: Level of anxiety will decrease Outcome: Progressing   Problem: Pain Managment: Goal: General experience of comfort will improve and/or be controlled Outcome: Progressing

## 2023-12-14 NOTE — Progress Notes (Signed)
 Nonbillable note Patient admitted to the hospital for symptomatic hypoglycemia and noted to have cholestatic jaundice.  MRCP was done as an outpatient and ERCP was recommended. Awaiting GI consultation Hold metoprolol  for now due to increased risk of blunting adrenergic symptoms of hypoglycemia Will start patient on a diet Change blood sugars to q 4 hours since blood sugars are stable

## 2023-12-14 NOTE — Consult Note (Signed)
 [facility] GI Inpatient Consult Note   Dorris Gaul, M.D.  Reason for Consult: Painless jaundice, elevated liver enzymes, possible malignancy.   Attending Requesting Consult: Brion Cancel, M.D.  History of Present Illness: Barbara Butler is a 89 y.o. female who was brought into the emergency room for decreased responsiveness in the setting of marked hypoglycemia in the setting of oral hypoglycemics therapies for type 2 diabetes mellitus.  Patient required resuscitation with amps of D10 and D50 to achieve alertness.  She was also admitted for hyponatremia with a sodium of 127, hypokalemia with potassium of 2.8 and metabolic acidosis with a bicarbonate of 18. Also noted at the time of presentation was an elevated bilirubin of 20.  Reportedly, the daughter-in-law and caregiver notified staff that the patient underwent an MRCP for painless jaundice on 12/03/2023 revealing abnormalities that required a GI referral that had yet to be scheduled. MRCP performed on 12/03/2023 was reviewed and noted to have marked intra and extrahepatic biliary dilatation along with dilatation of the pancreatic duct everything dilated to the level of the ampulla without any obvious intrahepatic calculus or mass.  There was a high index of suspicion by the radiologist that presumed occult mass at the ampulla and ERCP was recommended for further evaluation.  Past Medical History:  Past Medical History:  Diagnosis Date   Anemia    Atrial fibrillation (HCC)    Diabetes mellitus without complication (HCC)    diet controlled   Diabetic neuropathy (HCC)    Dysrhythmia    Hypertension    Hypothyroidism    Stroke (HCC) 01/2023   multiple strokes found at this time    Problem List: Patient Active Problem List   Diagnosis Date Noted   Hypoglycemia secondary to sulfonylurea 12/14/2023   Uncontrolled type 2 diabetes mellitus with hypoglycemia, without long-term current use of insulin  (HCC) 12/14/2023    History of GI diverticular bleed requiring emergency embolization 09/2023 12/14/2023   Atrial fibrillation, chronic (HCC) 12/14/2023   Painless jaundice 12/14/2023   Cholestatic jaundice 12/14/2023   GI bleed 09/19/2023   Lower GI bleed 09/15/2023   Common bile duct dilatation 09/15/2023   LFT elevation 09/15/2023   History of CVA (cerebrovascular accident) 06/16/2023   Acute metabolic encephalopathy 06/16/2023   AKI (acute kidney injury) (HCC) 06/16/2023   Hyperglycemia due to type 2 diabetes mellitus (HCC) 06/16/2023   Severe sepsis (HCC) 04/08/2023   Sepsis secondary to UTI (HCC) 04/05/2023   Diverticulosis of colon 04/05/2023   Cholelithiasis 04/05/2023   Leg swelling 04/05/2023   Fall 02/06/2023   Fall at nursing home, initial encounter 02/05/2023   Closed right hip fracture, initial encounter (HCC) 02/05/2023   Humeral surgical neck fracture 02/05/2023   Atrial flutter (HCC) 01/28/2023   Atrial fibrillation/flutter (HCC) 01/28/2023   Cerebrovascular accident (CVA) (HCC) 01/27/2023   Hypertension 01/26/2023   Hypothyroidism 01/26/2023   Confusion and disorientation / subacute cva. 01/26/2023   Type 2 diabetes mellitus with hyperglycemia, without long-term current use of insulin  (HCC) 08/31/2019    Past Surgical History: Past Surgical History:  Procedure Laterality Date   CATARACT EXTRACTION W/PHACO Right 01/28/2019   Procedure: CATARACT EXTRACTION PHACO AND INTRAOCULAR LENS PLACEMENT (IOC) RIGHT DIABETES;  Surgeon: Ola Berger, MD;  Location: [facility] ORS;  Service: Ophthalmology;  Laterality: Right;  US  00:49.3 CDE 8.12 Fluid Pack Lot # [id number]   CATARACT EXTRACTION W/PHACO Left 02/25/2019   Procedure: CATARACT EXTRACTION PHACO AND INTRAOCULAR LENS PLACEMENT (IOC), LEFT, DIABETIC, VISION BLUE;  Surgeon: Ola Berger, MD;  Location: [facility] ORS;  Service: Ophthalmology;  Laterality: Left;  US   01:10 CDE 11.43 Fluid pack lot # [id number]   EMBOLIZATION (CATH LAB) N/A 09/19/2023    Procedure: EMBOLIZATION;  Surgeon: Jerri Morale, MD;  Location: [facility] INVASIVE CV LAB;  Service: Cardiovascular;  Laterality: N/A;   ESOPHAGOGASTRODUODENOSCOPY N/A 09/18/2023   Procedure: EGD (ESOPHAGOGASTRODUODENOSCOPY);  Surgeon: Analiese Krupka, Alphonsus Jeans, MD;  Location: [facility] ENDOSCOPY;  Service: Gastroenterology;  Laterality: N/A;   FLEXIBLE SIGMOIDOSCOPY N/A 09/18/2023   Procedure: SIGMOIDOSCOPY, FLEXIBLE;  Surgeon: Tristian Bouska, Alphonsus Jeans, MD;  Location: [facility] ENDOSCOPY;  Service: Gastroenterology;  Laterality: N/A;   FRACTURE SURGERY Right 02/2023   hip   INTRAMEDULLARY (IM) NAIL INTERTROCHANTERIC Right 02/06/2023   Procedure: INTRAMEDULLARY NAILING OF RIGHT FEMUR;  Surgeon: Laneta Pintos, MD;  Location: [facility] OR;  Service: Orthopedics;  Laterality: Right;   ORIF WRIST FRACTURE Right 06/28/2016   Procedure: OPEN REDUCTION INTERNAL FIXATION (ORIF) WRIST FRACTURE;  Surgeon: Molli Angelucci, MD;  Location: [facility] ORS;  Service: Orthopedics;  Laterality: Right;   SALPINGECTOMY      Allergies: No Known Allergies  Home Medications: (Not in a hospital admission)  Home medication reconciliation was completed with the patient.   Scheduled Inpatient Medications:    potassium chloride   40 mEq Oral BID    Continuous Inpatient Infusions:    dextrose  75 mL/hr at 12/14/23 0716    PRN Inpatient Medications:  acetaminophen  **OR** acetaminophen , dextrose , ondansetron  **OR** ondansetron  (ZOFRAN ) IV  Family History: family history is not on file.   GI Family History: Negative  Social History:   reports that she quit smoking about 65 years ago. Her smoking use included cigarettes. She has never used smokeless tobacco. She reports that she does not currently use alcohol. She reports that she does not use drugs. The patient denies ETOH, tobacco, or drug use.    Review of Systems: Review of Systems - Negative except HPI  Physical Examination: BP (!) 141/82 (BP Location: Right Arm)   Pulse 77   Temp 97.8 F  (36.6 C) (Oral)   Resp 17   SpO2 100%  Physical Exam Constitutional:      General: She is not in acute distress.    Appearance: Normal appearance. She is obese. She is ill-appearing. She is not toxic-appearing or diaphoretic.  HENT:     Head: Normocephalic.     Nose: Nose normal.     Mouth/Throat:     Mouth: Mucous membranes are moist.     Pharynx: Oropharynx is clear.  Eyes:     General: Scleral icterus present.     Pupils: Pupils are equal, round, and reactive to light.  Cardiovascular:     Rate and Rhythm: Tachycardia present.     Heart sounds: No murmur heard.    No gallop.  Pulmonary:     Breath sounds: Normal breath sounds. No stridor. No rhonchi.  Abdominal:     General: There is distension.     Palpations: Abdomen is soft.     Tenderness: There is no abdominal tenderness. There is no guarding or rebound.  Skin:    General: Skin is warm and dry.     Capillary Refill: Capillary refill takes less than 2 seconds.     Coloration: Skin is jaundiced. Skin is not pale.     Findings: No bruising, erythema or rash.  Neurological:     Mental Status: She is alert.     Motor: Motor function is intact.  Coordination: Coordination is intact.     Comments: Hard of hearing      Data: Lab Results  Component Value Date   WBC 7.7 12/14/2023   HGB 11.3 (L) 12/14/2023   HCT 33.8 (L) 12/14/2023   MCV 89.7 12/14/2023   PLT 255 12/14/2023   Recent Labs  Lab 12/13/23 2156 12/14/23 0802  HGB 10.4* 11.3*   Lab Results  Component Value Date   NA 126 (L) 12/14/2023   K 3.1 (L) 12/14/2023   CL 95 (L) 12/14/2023   CO2 18 (L) 12/14/2023   BUN 12 12/14/2023   CREATININE 0.43 (L) 12/14/2023   Lab Results  Component Value Date   ALT 109 (H) 12/14/2023   AST 187 (H) 12/14/2023   ALKPHOS 854 (H) 12/14/2023   BILITOT 23.2 (HH) 12/14/2023   Recent Labs  Lab 12/14/23 0802  INR 1.5*      Latest Ref Rng & Units 12/14/2023    8:02 AM 12/13/2023    9:56 PM 10/03/2023    2:02  PM  CBC  WBC 4.0 - 10.5 K/uL 7.7  7.7  5.3   Hemoglobin 12.0 - 15.0 g/dL 16.1  09.6  9.9   Hematocrit 36.0 - 46.0 % 33.8  30.2  31.0   Platelets 150 - 400 K/uL 255  337  468     STUDIES: No results found. @IMAGES @  Assessment: Hypoglycemia- Corrected  to 120 at this time. Hyponatremia - persists at 126 today. On IV fluids. Hyperbilirubinemia. Obstructive jaundice - Ddx includes pancreatic neoplasm, ampullary neoplasm, less likely benign ampullary stenosis. Less likely cbd stone.    Recommendations:  1 Continue diet today. 2. NPO after MN. 3. Correct Na+ 4. ERCP when clinically feasible. Will ask Dr. Ole Berkeley to discuss with family given patient's increased procedural risks.  5. Will follow along.   Thank you for the consult. Please call with questions or concerns.  Mardy Shall, "Sylvester Evert MD [facility] [address] [place], [place] [address] [phone]  12/14/2023 11:51 AM

## 2023-12-14 NOTE — H&P (Addendum)
 History and Physical    Patient: Barbara Butler UJW:[id number] DOB: Feb 16, 1930 DOA: 12/13/2023 DOS: the patient was seen and examined on 12/14/2023 PCP: Antonio Baumgarten, MD  Patient coming from: Home  Chief Complaint: Unresponsive episode, low blood sugar   HPI: Barbara Butler is a 89 y.o. female with medical history significant for atrial fibrillation on Eliquis , hypertension, type 2 diabetes mellitus, CVA, hypothyroidism, hospitalized in March for diverticular bleed requiring 2 units PRBCs and urgent embolization who is being admitted with hypoglycemia presenting with 2 episodes of decreased responsiveness.  On both occasions her blood sugar was low.  During the episode just prior to arrival she developed gurgling respirations.  EMS was called and they found her blood sugar in the 50s and started her on D10.  History provided by daughter-in-law and caregiver at bedside.  She is both on glimepiride and metformin .  Of note, patient recently had an MRCP 12/03/2023 indicated for painless jaundice noted by her physical therapist.  They were referred to GI based on the results, but according to daughter-in-law, patient was not interested in following up with GI, saying " I am tired" and was not interested in following up.  She states she decided to try to encourage her at a later time. ED course and data review: Mildly tachycardic to 101 with otherwise normal vitals..  By arrival blood sugar 92.  Other labs notable for sodium 127, potassium 2.8, bicarb 18, hemoglobin 10 .EKG with A-fib at 84. Patient was given an amp of D50 and oral potassium and started on a D10 infusion Hospitalist consulted for admission.   Following admission to the hospitalist service, hepatic function panel resulted showing bilirubin of 20, up from 0.5 along with other abnormalities    Past Medical History:  Diagnosis Date   Anemia    Atrial fibrillation (HCC)    Diabetes mellitus without complication (HCC)    diet  controlled   Diabetic neuropathy (HCC)    Dysrhythmia    Hypertension    Hypothyroidism    Stroke (HCC) 01/2023   multiple strokes found at this time   Past Surgical History:  Procedure Laterality Date   CATARACT EXTRACTION W/PHACO Right 01/28/2019   Procedure: CATARACT EXTRACTION PHACO AND INTRAOCULAR LENS PLACEMENT (IOC) RIGHT DIABETES;  Surgeon: Ola Berger, MD;  Location: [facility] ORS;  Service: Ophthalmology;  Laterality: Right;  US  00:49.3 CDE 8.12 Fluid Pack Lot # [id number]   CATARACT EXTRACTION W/PHACO Left 02/25/2019   Procedure: CATARACT EXTRACTION PHACO AND INTRAOCULAR LENS PLACEMENT (IOC), LEFT, DIABETIC, VISION BLUE;  Surgeon: Ola Berger, MD;  Location: [facility] ORS;  Service: Ophthalmology;  Laterality: Left;  US   01:10 CDE 11.43 Fluid pack lot # [id number]   EMBOLIZATION (CATH LAB) N/A 09/19/2023   Procedure: EMBOLIZATION;  Surgeon: Jerri Morale, MD;  Location: [facility] INVASIVE CV LAB;  Service: Cardiovascular;  Laterality: N/A;   ESOPHAGOGASTRODUODENOSCOPY N/A 09/18/2023   Procedure: EGD (ESOPHAGOGASTRODUODENOSCOPY);  Surgeon: Toledo, Alphonsus Jeans, MD;  Location: [facility] ENDOSCOPY;  Service: Gastroenterology;  Laterality: N/A;   FLEXIBLE SIGMOIDOSCOPY N/A 09/18/2023   Procedure: SIGMOIDOSCOPY, FLEXIBLE;  Surgeon: Toledo, Alphonsus Jeans, MD;  Location: [facility] ENDOSCOPY;  Service: Gastroenterology;  Laterality: N/A;   FRACTURE SURGERY Right 02/2023   hip   INTRAMEDULLARY (IM) NAIL INTERTROCHANTERIC Right 02/06/2023   Procedure: INTRAMEDULLARY NAILING OF RIGHT FEMUR;  Surgeon: Laneta Pintos, MD;  Location: [facility] OR;  Service: Orthopedics;  Laterality: Right;   ORIF WRIST FRACTURE Right 06/28/2016   Procedure: OPEN REDUCTION INTERNAL FIXATION (ORIF)  WRIST FRACTURE;  Surgeon: Molli Angelucci, MD;  Location: [facility] ORS;  Service: Orthopedics;  Laterality: Right;   SALPINGECTOMY     Social History:  reports that she quit smoking about 65 years ago. Her smoking use included cigarettes. She has never used  smokeless tobacco. She reports that she does not currently use alcohol. She reports that she does not use drugs.  No Known Allergies  Family History  Problem Relation Age of Onset   Breast cancer Neg Hx     Prior to Admission medications   Medication Sig Start Date End Date Taking? Authorizing Provider  metFORMIN  (GLUCOPHAGE -XR) 750 MG 24 hr tablet Take 750 mg by mouth daily. 10/12/23  Yes [provider]  amLODipine  (NORVASC ) 5 MG tablet Take 1 tablet (5 mg total) by mouth daily. 05/27/23   Furth, Cadence H, PA-C  apixaban  (ELIQUIS ) 2.5 MG TABS tablet Take 1 tablet (2.5 mg total) by mouth 2 (two) times daily. 08/06/23   Wenona Hamilton, MD  furosemide  (LASIX ) 20 MG tablet Take 2 tablets (40 mg total) by mouth daily. 06/13/23   Furth, Cadence H, PA-C  glimepiride (AMARYL) 1 MG tablet Take 1 mg by mouth daily.    [provider]  hydrALAZINE  (APRESOLINE ) 50 MG tablet Take 1 tablet (50 mg total) by mouth 2 (two) times daily. 05/27/23   Furth, Cadence H, PA-C  levothyroxine  (SYNTHROID , LEVOTHROID) 88 MCG tablet Take 88 mcg by mouth daily.  12/05/15   [provider]  metFORMIN  (GLUCOPHAGE ) 500 MG tablet Take 1 tablet (500 mg total) by mouth 2 (two) times daily with a meal. Patient taking differently: Take 500 mg by mouth daily with breakfast. 06/17/23   Wouk, Haynes Lips, MD  metoprolol  succinate (TOPROL -XL) 50 MG 24 hr tablet Take 1 tablet (50 mg total) by mouth daily. Take with or immediately following a meal. 05/27/23 09/17/23  Furth, Cadence H, PA-C  Multiple Vitamins-Minerals (CENTRUM SILVER 50+WOMEN PO) Take 1 tablet by mouth daily.    [provider]  olmesartan (BENICAR) 40 MG tablet Take 40 mg by mouth daily.    [provider]  polyethylene glycol (MIRALAX  / GLYCOLAX ) 17 g packet Take 17 g by mouth daily as needed for mild constipation. 02/10/23   Armenta Landau, MD    Physical Exam: Vitals:   12/13/23 2152  BP: (!) 145/89  Pulse: (!)  101  Resp: 20  Temp: 97.9 F (36.6 C)  TempSrc: Oral  SpO2: 100%   Physical Exam Vitals and nursing note reviewed.  Constitutional:      General: She is not in acute distress.    Comments: Frail-appearing elderly female in no distress  HENT:     Head: Normocephalic and atraumatic.  Cardiovascular:     Rate and Rhythm: Normal rate. Rhythm irregular.     Heart sounds: Normal heart sounds.  Pulmonary:     Effort: Pulmonary effort is normal.     Breath sounds: Normal breath sounds.  Abdominal:     Palpations: Abdomen is soft.     Tenderness: There is no abdominal tenderness.  Skin:    Coloration: Skin is jaundiced.  Neurological:     Mental Status: Mental status is at baseline.     Labs on Admission: I have personally reviewed following labs and imaging studies  CBC: Recent Labs  Lab 12/13/23 2156  WBC 7.7  NEUTROABS 6.5  HGB 10.4*  HCT 30.2*  MCV 86.0  PLT 337   Basic Metabolic Panel: Recent  Labs  Lab 12/13/23 2156 12/13/23 2357  NA 127*  --   K 2.8*  --   CL 97*  --   CO2 18*  --   GLUCOSE 92  --   BUN 14  --   CREATININE 0.56  --   CALCIUM  8.6*  --   MG  --  2.0      Latest Ref Rng & Units 12/13/2023   11:57 PM 12/13/2023    9:56 PM 09/21/2023    3:20 AM  CMP  Glucose 70 - 99 mg/dL  92  161   BUN 8 - 23 mg/dL  14  20   Creatinine 0.96 - 1.00 mg/dL  0.45  4.09   Sodium 811 - 145 mmol/L  127  133   Potassium 3.5 - 5.1 mmol/L  2.8  3.5   Chloride 98 - 111 mmol/L  97  108   CO2 22 - 32 mmol/L  18  18   Calcium  8.9 - 10.3 mg/dL  8.6  8.4   Total Protein 6.5 - 8.1 g/dL 5.6     Total Bilirubin 0.0 - 1.2 mg/dL 91.4     Alkaline Phos 38 - 126 U/L 693     AST 15 - 41 U/L 161     ALT 0 - 44 U/L 87       GFR: CrCl cannot be calculated (Unknown ideal weight.). Liver Function Tests: No results for input(s): "AST", "ALT", "ALKPHOS", "BILITOT", "PROT", "ALBUMIN" in the last 168 hours. No results for input(s): "LIPASE", "AMYLASE" in the last 168 hours. No  results for input(s): "AMMONIA" in the last 168 hours. Coagulation Profile: No results for input(s): "INR", "PROTIME" in the last 168 hours. Cardiac Enzymes: No results for input(s): "CKTOTAL", "CKMB", "CKMBINDEX", "TROPONINI" in the last 168 hours. BNP (last 3 results) No results for input(s): "PROBNP" in the last 8760 hours. HbA1C: No results for input(s): "HGBA1C" in the last 72 hours. CBG: Recent Labs  Lab 12/13/23 2205 12/13/23 2308 12/14/23 0021  GLUCAP 92 60* 143*   Lipid Profile: No results for input(s): "CHOL", "HDL", "LDLCALC", "TRIG", "CHOLHDL", "LDLDIRECT" in the last 72 hours. Thyroid  Function Tests: No results for input(s): "TSH", "T4TOTAL", "FREET4", "T3FREE", "THYROIDAB" in the last 72 hours. Anemia Panel: No results for input(s): "VITAMINB12", "FOLATE", "FERRITIN", "TIBC", "IRON", "RETICCTPCT" in the last 72 hours. Urine analysis:    Component Value Date/Time   COLORURINE YELLOW (A) 06/16/2023 1612   APPEARANCEUR CLEAR (A) 06/16/2023 1612   LABSPEC 1.016 06/16/2023 1612   PHURINE 7.0 06/16/2023 1612   GLUCOSEU >=500 (A) 06/16/2023 1612   HGBUR NEGATIVE 06/16/2023 1612   BILIRUBINUR NEGATIVE 06/16/2023 1612   KETONESUR NEGATIVE 06/16/2023 1612   PROTEINUR 30 (A) 06/16/2023 1612   NITRITE NEGATIVE 06/16/2023 1612   LEUKOCYTESUR NEGATIVE 06/16/2023 1612    Radiological Exams on Admission: No results found. Data Reviewed for HPI: Relevant notes from primary care and specialist visits, past discharge summaries as available in EHR, including Care Everywhere. Prior diagnostic testing as pertinent to current admission diagnoses Updated medications and problem lists for reconciliation ED course, including vitals, labs, imaging, treatment and response to treatment Triage notes, nursing and pharmacy notes and ED provider's notes Notable results as noted above in HPI      Assessment and Plan: * Hypoglycemia secondary to sulfonylurea Episodes of decreased  responsiveness secondary to hypoglycemia Non-insulin -dependent type 2 diabetes Hypoglycemia likely caused by newly impaired liver metabolism as well as age Continue D10 infusion CBG every  Close monitoring in stepdown Discontinuing both sulfonylurea and metformin   Cholestatic jaundice Possible liver failure (TB 20.4, alk phos 693, AST 161, ALT 87) Abnormal MRCP 12/03/2023 MRCP showed "unusually severe intra and extrahepatic biliary ductal dilatation, ...no obvious obstructing calculus or mass". Follow INR Holding Eliquis  GI consulted Extensive conversation with both daughter and daughter-in-law: They would like GI consult in-house but will abide by patient's wishes if she would like no further intervention Will keep n.p.o. in case ERCP can be performed Palliative care consult, discussed with daughter-in-law(patient's daughter defers to her daughter-in-law and primary caregiver for decisions)  Atrial fibrillation, chronic (HCC) Chronic anticoagulation Hold apixaban  due to abnormal liver function Will get INR SCD for DVT prophylaxis  History of GI diverticular bleed requiring emergency embolization 09/2023 Chronic anemia Hemoglobin currently stable-improved from prior  History of CVA (cerebrovascular accident) Acute stroke not suspected Holding apixaban  and statin due to liver dysfunction  Hypothyroidism Continue levothyroxine   Hypertension Continue metoprolol     DVT prophylaxis: apixaban   Consults: GI and palliative care  Advance Care Planning:   Code Status: Full Code   Family Communication: [name] at bedside  Disposition Plan: Back to previous home environment  Severity of Illness: The appropriate patient status for this patient is INPATIENT. Inpatient status is judged to be reasonable and necessary in order to provide the required intensity of service to ensure the patient's safety. The patient's presenting symptoms, physical exam findings, and initial  radiographic and laboratory data in the context of their chronic comorbidities is felt to place them at high risk for further clinical deterioration. Furthermore, it is not anticipated that the patient will be medically stable for discharge from the hospital within 2 midnights of admission.   * I certify that at the point of admission it is my clinical judgment that the patient will require inpatient hospital care spanning beyond 2 midnights from the point of admission due to high intensity of service, high risk for further deterioration and high frequency of surveillance required.*  Author: Lanetta Pion, MD 12/14/2023 12:58 AM  For on call review [email or web address].

## 2023-12-14 NOTE — Consult Note (Signed)
 PHARMACY CONSULT NOTE - FOLLOW UP  Pharmacy Consult for Electrolyte Monitoring and Replacement   Recent Labs: Potassium (mmol/L)  Date Value  12/14/2023 3.1 (L)   Magnesium  (mg/dL)  Date Value  16/04/9603 2.0   Calcium  (mg/dL)  Date Value  54/03/8118 9.0   Albumin (g/dL)  Date Value  14/78/2956 2.7 (L)   Sodium (mmol/L)  Date Value  12/14/2023 126 (L)     Assessment: 89 y.o. female with medical history significant for atrial fibrillation on Eliquis , hypertension, type 2 diabetes mellitus, CVA, hypothyroidism, hospitalized in March for diverticular bleed requiring 2 units PRBCs and urgent embolization who is being admitted with hypoglycemia presenting with 2 episodes of decreased responsiveness. PTA meds: lasix , olmesartan   D10 @ 75 ml/hr.   Goal of Therapy:  WNL  Plan:  Kcl 40 mEq x 2  F/u with AM labs.   Trinidad Funk ,PharmD Clinical Pharmacist 12/14/2023 8:36 AM

## 2023-12-14 NOTE — Assessment & Plan Note (Addendum)
 Episodes of decreased responsiveness secondary to hypoglycemia Non-insulin -dependent type 2 diabetes Hypoglycemia likely caused by newly impaired liver metabolism as well as age Continue D10 infusion CBG every Close monitoring in stepdown Discontinuing both sulfonylurea and metformin 

## 2023-12-14 NOTE — Hospital Course (Signed)
 Barbara Butler

## 2023-12-14 NOTE — Assessment & Plan Note (Signed)
 Chronic anemia Hemoglobin currently stable-improved from prior

## 2023-12-14 NOTE — ED Notes (Addendum)
 Lab called for assistance in drawing labs. Due to IV not pulling back and 2 missed straight sticks. Stated they will be coming down.

## 2023-12-14 NOTE — ED Notes (Signed)
 Pharmacy contacted with regards to D-10 not being on the floor. If possible to have it sent down.

## 2023-12-14 NOTE — ED Notes (Signed)
 This RN looked for IV access for 0500 labs. RN was unsuccessful. Waiting for lab to come and obtain labs.

## 2023-12-14 NOTE — Assessment & Plan Note (Addendum)
 Chronic anticoagulation Hold apixaban  due to abnormal liver function Will get INR SCD for DVT prophylaxis

## 2023-12-14 NOTE — ED Notes (Addendum)
 Family in room with patient. RN updated family that patient is still waiting on a bed. Also, The MD ordered a diet for the patient and she should be receiving food shortly. Patient sitting up in the bed speaking with family.

## 2023-12-14 NOTE — Assessment & Plan Note (Signed)
 Continue levothyroxine 

## 2023-12-14 NOTE — ED Notes (Signed)
 Patient stated to RN that she would like some food. MD notified, requesting diet order.

## 2023-12-14 NOTE — Assessment & Plan Note (Signed)
 Continue metoprolol.

## 2023-12-15 ENCOUNTER — Encounter: Payer: Self-pay | Admitting: Internal Medicine

## 2023-12-15 ENCOUNTER — Inpatient Hospital Stay

## 2023-12-15 ENCOUNTER — Inpatient Hospital Stay: Admitting: General Practice

## 2023-12-15 ENCOUNTER — Telehealth: Payer: Self-pay

## 2023-12-15 ENCOUNTER — Encounter
Admission: EM | Disposition: A | Discharge status: Home Health | Payer: Self-pay | Source: Home / Self Care | Attending: Internal Medicine

## 2023-12-15 DIAGNOSIS — Z515 Encounter for palliative care: Secondary | ICD-10-CM

## 2023-12-15 DIAGNOSIS — K838 Other specified diseases of biliary tract: Secondary | ICD-10-CM | POA: Diagnosis not present

## 2023-12-15 DIAGNOSIS — T383X1A Poisoning by insulin and oral hypoglycemic [antidiabetic] drugs, accidental (unintentional), initial encounter: Secondary | ICD-10-CM | POA: Diagnosis not present

## 2023-12-15 DIAGNOSIS — K831 Obstruction of bile duct: Secondary | ICD-10-CM | POA: Diagnosis not present

## 2023-12-15 DIAGNOSIS — E160 Drug-induced hypoglycemia without coma: Secondary | ICD-10-CM

## 2023-12-15 HISTORY — PX: ERCP: SHX5425

## 2023-12-15 LAB — RENAL FUNCTION PANEL
Albumin: 2.5 g/dL — ABNORMAL LOW (ref 3.5–5.0)
Anion gap: 9 (ref 5–15)
BUN: 15 mg/dL (ref 8–23)
CO2: 17 mmol/L — ABNORMAL LOW (ref 22–32)
Calcium: 8.8 mg/dL — ABNORMAL LOW (ref 8.9–10.3)
Chloride: 99 mmol/L (ref 98–111)
Creatinine, Ser: 0.71 mg/dL (ref 0.44–1.00)
GFR, Estimated: >60 mL/min (ref >60)
Glucose, Bld: 162 mg/dL — ABNORMAL HIGH (ref 70–99)
Phosphorus: 2.1 mg/dL — ABNORMAL LOW (ref 2.5–4.6)
Potassium: 4.4 mmol/L (ref 3.5–5.1)
Sodium: 125 mmol/L — ABNORMAL LOW (ref 135–145)

## 2023-12-15 LAB — GLUCOSE, CAPILLARY
Glucose-Capillary: 151 mg/dL — ABNORMAL HIGH (ref 70–99)
Glucose-Capillary: 127 mg/dL — ABNORMAL HIGH (ref 70–99)
Glucose-Capillary: 128 mg/dL — ABNORMAL HIGH (ref 70–99)
Glucose-Capillary: 96 mg/dL (ref 70–99)
Glucose-Capillary: 139 mg/dL — ABNORMAL HIGH (ref 70–99)
Glucose-Capillary: 125 mg/dL — ABNORMAL HIGH (ref 70–99)

## 2023-12-15 LAB — MAGNESIUM: Magnesium: 1.9 mg/dL (ref 1.7–2.4)

## 2023-12-15 SURGERY — ERCP, WITH INTERVENTION IF INDICATED
Anesthesia: General

## 2023-12-15 MED ORDER — LIDOCAINE HCL (PF) 2 % IJ SOLN
INTRAMUSCULAR | Status: AC
  Filled 2023-12-15: qty 5

## 2023-12-15 MED ORDER — SUCCINYLCHOLINE CHLORIDE 200 MG/10ML IV SOSY
PREFILLED_SYRINGE | INTRAVENOUS | Status: DC | PRN
  Administered 2023-12-15: 60 mg via INTRAVENOUS

## 2023-12-15 MED ORDER — SODIUM PHOSPHATES 45 MMOLE/15ML IV SOLN
15.0000 mmol | Freq: Once | INTRAVENOUS | Status: AC
  Administered 2023-12-15: 15 mmol via INTRAVENOUS
  Filled 2023-12-15: qty 5

## 2023-12-15 MED ORDER — PROPOFOL 10 MG/ML IV BOLUS
INTRAVENOUS | Status: AC
  Filled 2023-12-15: qty 20

## 2023-12-15 MED ORDER — DICLOFENAC SUPPOSITORY 100 MG
100.0000 mg | Freq: Once | RECTAL | Status: AC
  Administered 2023-12-15: 100 mg via RECTAL
  Filled 2023-12-15: qty 1

## 2023-12-15 MED ORDER — SODIUM CHLORIDE 0.9 % IV SOLN: INTRAVENOUS | Status: AC

## 2023-12-15 MED ORDER — PHENYLEPHRINE HCL (PRESSORS) 10 MG/ML IV SOLN
INTRAVENOUS | Status: DC | PRN
Start: 2023-12-15 — End: 2023-12-15
  Administered 2023-12-15: 80 ug via INTRAVENOUS

## 2023-12-15 MED ORDER — SUCCINYLCHOLINE CHLORIDE 200 MG/10ML IV SOSY
PREFILLED_SYRINGE | INTRAVENOUS | Status: AC
  Filled 2023-12-15: qty 10

## 2023-12-15 MED ORDER — LACTATED RINGERS IV SOLN: INTRAVENOUS | Status: DC

## 2023-12-15 MED ORDER — PROPOFOL 10 MG/ML IV BOLUS
INTRAVENOUS | Status: DC | PRN
  Administered 2023-12-15: 20 mg via INTRAVENOUS
  Administered 2023-12-15: 50 mg via INTRAVENOUS

## 2023-12-15 MED ORDER — LIDOCAINE HCL (CARDIAC) PF 100 MG/5ML IV SOSY
PREFILLED_SYRINGE | INTRAVENOUS | Status: DC | PRN
  Administered 2023-12-15: 50 mg via INTRAVENOUS

## 2023-12-15 NOTE — Inpatient Diabetes Management (Signed)
 Inpatient Diabetes Program Recommendations  AACE/ADA: New Consensus Statement on Inpatient Glycemic Control (2015)  Target Ranges:  Prepandial:   less than 140 mg/dL      Peak postprandial:   less than 180 mg/dL (1-2 hours)      Critically ill patients:  140 - 180 mg/dL   Lab Results  Component Value Date   GLUCAP 151 (H) 12/15/2023   HGBA1C 7.9 (H) 01/27/2023    Latest Reference Range & Units 12/14/23 08:02  Total Bilirubin 0.0 - 1.2 mg/dL 53.6 (HH)  (HH): Data is critically high   Diabetes history: DM2 Outpatient Diabetes medications: Amaryl 1 mg every day, Metformin  500 mg QD Current orders for Inpatient glycemic control: Novolog  0-9 units TID  Met with patient and family at bedside.  Daughter in law states she had 2 episodes of hypoglycemia at home.  Family threw away Amaryl after being admitted with hypoglycemia.  Explained Amaryl can cause hypoglycemia, especially in the elderly and not eating well.  Please consider discontinuing Metformin  as well at discharge.    Thank you, Hays Lipschutz, MSN, CDCES Diabetes Coordinator Inpatient Diabetes Program [phone] (team pager from 8a-5p)

## 2023-12-15 NOTE — Plan of Care (Signed)
  Problem: Education: Goal: Knowledge of General Education information will improve Description: Including pain rating scale, medication(s)/side effects and non-pharmacologic comfort measures Outcome: Progressing   Problem: Clinical Measurements: Goal: Ability to maintain clinical measurements within normal limits will improve Outcome: Progressing Goal: Cardiovascular complication will be avoided Outcome: Progressing   Problem: Activity: Goal: Risk for activity intolerance will decrease Outcome: Progressing   Problem: Coping: Goal: Level of anxiety will decrease Outcome: Progressing   

## 2023-12-15 NOTE — Plan of Care (Signed)
     Referral previously received for Barbara Butler for goals of care discussion. Noted most recent palliative in-person assessment dated 12/14/2023 at which time it was recommended to follow for possible ERCP and results to help family in decision making.  Chart reviewed for Recent provider notes, TOC notes, vitals, and labs and updates received from RN.   At this time patient appears stable, scheduled for ERCP today. When I went to see the patient she had just been taken for ERCP.  I called and spoke with patient's HCPOA Barbara Butler who states she is in pre-procedure, likely going for procedure in 15 mins. We discussed stress and "journey" of serious illness, taking it one step at a time.  I shared that Palliative Medicine would assist patient and family during this journey and admission.  I shared I would follow-up with the patient and family tomorrow. I provided emotional and general support through therapeutic listening, empathy, and other techniques. I answered all questions and addressed all concerns to the best of my ability.  Thank you for your referral and allowing PMT to assist in Barbara Butler's care.   Lizbeth Right, NP Palliative Medicine Team Phone: [phone]  NO CHARGE

## 2023-12-15 NOTE — Anesthesia Procedure Notes (Signed)
 Procedure Name: Intubation Date/Time: 12/15/2023 2:58 PM  Performed by: Wilkins Hardy I, CRNAPre-anesthesia Checklist: Patient identified, Emergency Drugs available, Suction available and Patient being monitored Patient Re-evaluated:Patient Re-evaluated prior to induction Oxygen Delivery Method: Circle system utilized Preoxygenation: Pre-oxygenation with 100% oxygen Induction Type: IV induction Ventilation: Mask ventilation without difficulty Laryngoscope Size: McGrath and 3 Grade View: Grade I Tube type: Oral Number of attempts: 1 Airway Equipment and Method: Stylet and Oral airway Placement Confirmation: ETT inserted through vocal cords under direct vision, positive ETCO2 and breath sounds checked- equal and bilateral Secured at: 19 cm Tube secured with: Tape Dental Injury: Teeth and Oropharynx as per pre-operative assessment

## 2023-12-15 NOTE — Anesthesia Preprocedure Evaluation (Signed)
 Anesthesia Evaluation  Patient identified by MRN, date of birth, ID band Patient awake    Reviewed: Allergy & Precautions, H&P , NPO status , Patient's Chart, lab work & pertinent test results, reviewed documented beta blocker date and time   History of Anesthesia Complications Negative for: history of anesthetic complications  Airway Mallampati: III  TM Distance: >3 FB Neck ROM: full    Dental  (+) Chipped, Dental Advidsory Given   Pulmonary neg pulmonary ROS, neg shortness of breath, neg COPD, neg recent URI, former smoker   Pulmonary exam normal breath sounds clear to auscultation       Cardiovascular Exercise Tolerance: Good hypertension, (-) angina (-) Past MI and (-) Cardiac Stents + dysrhythmias Atrial Fibrillation  Rhythm:regular Rate:Normal     Neuro/Psych neg Seizures CVA, No Residual Symptoms  negative psych ROS   GI/Hepatic negative GI ROS, Neg liver ROS,,,  Endo/Other  diabetesHypothyroidism    Renal/GU Renal disease  negative genitourinary   Musculoskeletal   Abdominal   Peds  Hematology  (+) Blood dyscrasia, anemia   Anesthesia Other Findings Past Medical History: No date: Anemia No date: Atrial fibrillation (HCC) No date: Diabetes mellitus without complication (HCC)     Comment:  diet controlled No date: Diabetic neuropathy (HCC) No date: Dysrhythmia No date: Hypertension No date: Hypothyroidism 01/2023: Stroke [facility])     Comment:  multiple strokes found at this time  Past Surgical History: 01/28/2019: CATARACT EXTRACTION W/PHACO; Right     Comment:  Procedure: CATARACT EXTRACTION PHACO AND INTRAOCULAR               LENS PLACEMENT (IOC) RIGHT DIABETES;  Surgeon: Ola Berger, MD;  Location: [facility] ORS;  Service: Ophthalmology;               Laterality: Right;  US  00:49.3 CDE 8.12 Fluid Pack Lot               # [id number] 02/25/2019: CATARACT EXTRACTION W/PHACO; Left      Comment:  Procedure: CATARACT EXTRACTION PHACO AND INTRAOCULAR               LENS PLACEMENT (IOC), LEFT, DIABETIC, VISION BLUE;                Surgeon: Ola Berger, MD;  Location: [facility] ORS;                Service: Ophthalmology;  Laterality: Left;  US                 01:10 CDE 11.43 Fluid pack lot # [id number] 09/19/2023: EMBOLIZATION (CATH LAB); N/A     Comment:  Procedure: EMBOLIZATION;  Surgeon: Jerri Morale, MD;                Location: [facility] INVASIVE CV LAB;  Service: Cardiovascular;              Laterality: N/A; 09/18/2023: ESOPHAGOGASTRODUODENOSCOPY; N/A     Comment:  Procedure: EGD (ESOPHAGOGASTRODUODENOSCOPY);  Surgeon:               Toledo, Alphonsus Jeans, MD;  Location: [facility] ENDOSCOPY;                Service: Gastroenterology;  Laterality: N/A; 09/18/2023: FLEXIBLE SIGMOIDOSCOPY; N/A     Comment:  Procedure: SIGMOIDOSCOPY, FLEXIBLE;  Surgeon: Vann Crossroads,               Teodoro K,  MD;  Location: [facility] ENDOSCOPY;  Service:               Gastroenterology;  Laterality: N/A; 02/2023: FRACTURE SURGERY; Right     Comment:  hip 02/06/2023: INTRAMEDULLARY (IM) NAIL INTERTROCHANTERIC; Right     Comment:  Procedure: INTRAMEDULLARY NAILING OF RIGHT FEMUR;                Surgeon: Laneta Pintos, MD;  Location: [facility] OR;  Service:              Orthopedics;  Laterality: Right; 06/28/2016: ORIF WRIST FRACTURE; Right     Comment:  Procedure: OPEN REDUCTION INTERNAL FIXATION (ORIF) WRIST              FRACTURE;  Surgeon: Molli Angelucci, MD;  Location: [facility]               ORS;  Service: Orthopedics;  Laterality: Right; No date: SALPINGECTOMY  BMI    Body Mass Index: 21.29 kg/m      Reproductive/Obstetrics negative OB ROS                             Anesthesia Physical Anesthesia Plan  ASA: 3  Anesthesia Plan: General ETT   Post-op Pain Management:    Induction: Intravenous  PONV Risk Score and Plan: 3 and Ondansetron  and Dexamethasone   Airway Management Planned: Oral  ETT  Additional Equipment:   Intra-op Plan:   Post-operative Plan: Extubation in OR  Informed Consent: I have reviewed the patients History and Physical, chart, labs and discussed the procedure including the risks, benefits and alternatives for the proposed anesthesia with the patient or authorized representative who has indicated his/her understanding and acceptance.     Dental Advisory Given  Plan Discussed with: Anesthesiologist, CRNA and Surgeon  Anesthesia Plan Comments: (Patient's daughter in law consented for risks of anesthesia including but not limited to:  - adverse reactions to medications - damage to eyes, teeth, lips or other oral mucosa - nerve damage due to positioning  - sore throat or hoarseness - Damage to heart, brain, nerves, lungs, other parts of body or loss of life  Patient's daughter in law voiced understanding and assent.)       Anesthesia Quick Evaluation

## 2023-12-15 NOTE — Op Note (Addendum)
 [facility] Gastroenterology Patient Name: Barbara Butler Procedure Date: 12/15/2023 2:30 PM MRN: [id number] Account #: [id number] Date of Birth: 18-Apr-1930 Admit Type: Inpatient Age: 89 Room: [facility] ENDO ROOM 4 Gender: Female Note Status: Finalized Instrument Name: Jhonnie Mosher [id number] Procedure:             ERCP Indications:           Jaundice, Elevated liver enzymes Providers:             Marnee Sink MD, MD Medicines:             General Anesthesia Complications:         No immediate complications. Procedure:             Pre-Anesthesia Assessment:                        - Prior to the procedure, a History and Physical was                         performed, and patient medications and allergies were                         reviewed. The patient's tolerance of previous                         anesthesia was also reviewed. The risks and benefits                         of the procedure and the sedation options and risks                         were discussed with the patient. All questions were                         answered, and informed consent was obtained. Prior                         Anticoagulants: The patient has taken Eliquis                          (apixaban ), last dose was 4 days prior to procedure.                         ASA Grade Assessment: III - A patient with severe                         systemic disease. After reviewing the risks and                         benefits, the patient was deemed in satisfactory                         condition to undergo the procedure.                        After obtaining informed consent, the scope was passed                         under direct vision. Throughout the procedure,  the                         patient's blood pressure, pulse, and oxygen                         saturations were monitored continuously. The                         Duodenoscope was introduced through the mouth, and                          used to inject contrast into and used to inject                         contrast into the bile duct. The ERCP was accomplished                         without difficulty. The patient tolerated the                         procedure well. Findings:      The scout film was normal. The major papilla was bulging. The bile duct       was deeply cannulated with the short-nosed traction sphincterotome.       Contrast was injected. I personally interpreted the bile duct images.       There was brisk flow of contrast through the ducts. Image quality was       excellent. Contrast extended to the entire biliary tree. The main bile       duct was severely dilated, acquired. The lower third of the main bile       duct contained localized stenoses. A wire was passed into the biliary       tree. A 7 mm biliary sphincterotomy was made with a traction (standard)       sphincterotome using ERBE electrocautery. There was no       post-sphincterotomy bleeding. The biliary tree was swept with a 15 mm       balloon starting at the bifurcation. Nothing was found. One 10 Fr by 9       cm plastic stent with a single external flap and a single internal flap       was placed 7 cm into the common bile duct. Bile flowed through the       stent. The stent was in good position. The ampulla was biopsied with a       cold forceps for histology. Impression:            - The major papilla appeared to be bulging.                        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                        - The entire main bile duct was severely dilated,  acquired.                        - A biliary sphincterotomy was performed.                        - The biliary tree was swept and nothing was found.                        - One plastic stent was placed into the common bile                         duct.                        - Biopsy was  performed The ampulla. Recommendation:        - Return patient to hospital ward for ongoing care.                        - Resume previous diet.                        - Watch for pancreatitis, bleeding, perforation, and                         cholangitis.                        - Await pathology results.                        - Repeat ERCP in 3 months to remove stent. Procedure Code(s):     --- Professional ---                        [phone], Endoscopic retrograde cholangiopancreatography                         (ERCP); with placement of endoscopic stent into                         biliary or pancreatic duct, including pre- and                         post-dilation and guide wire passage, when performed,                         including sphincterotomy, when performed, each stent                        43261, 59, Endoscopic retrograde                         cholangiopancreatography (ERCP); with biopsy, single                         or multiple                        74328, Endoscopic catheterization of the biliary  ductal system, radiological supervision and                         interpretation Diagnosis Code(s):     --- Professional ---                        R17, Unspecified jaundice                        R74.8, Abnormal levels of other serum enzymes                        K83.1, Obstruction of bile duct CPT copyright 2022 American Medical Association. All rights reserved. The codes documented in this report are preliminary and upon coder review may  be revised to meet current compliance requirements. Marnee Sink MD, MD 12/15/2023 3:34:36 PM This report has been signed electronically. Number of Addenda: 0 Note Initiated On: 12/15/2023 2:30 PM Estimated Blood Loss:  Estimated blood loss: none.      [facility]

## 2023-12-15 NOTE — TOC Initial Note (Signed)
 Transition of Care [facility]) - Initial/Assessment Note    Patient Details  Name: Barbara Butler MRN: [id number] Date of Birth: Nov 06, 1929  Transition of Care [facility]) CM/SW Contact:    Odilia Bennett, LCSW Phone Number: 12/15/2023, 10:31 AM  Clinical Narrative: Readmission prevention screen complete. Patient not fully oriented. Daughter and granddaughter at bedside. CSW introduced role and explained that discharge planning would be discussed. PCP is Dr. Kevan Peers. [name] drives her to appointments. [facility] is [facility]. She also has the [facility] listed in her chart. No issues obtaining medications. Patient lives home alone but has 24/7 private caregiver. She has a RW, wheelchair, BSC, and shower chair at home. Daughter said she does not use the shower chair. No further concerns. CSW will continue to follow patient and her family for support and facilitate return home once stable. [name], daughter, or granddaughter will transport her home at discharge.                 Expected Discharge Plan: Home/Self Care (with personal care services) Barriers to Discharge: Continued Medical Work up   Patient Goals and CMS Choice            Expected Discharge Plan and Services     Post Acute Care Choice: Resumption of Svcs/PTA Provider Living arrangements for the past 2 months: Single Family Home                                      Prior Living Arrangements/Services Living arrangements for the past 2 months: Single Family Home Lives with:: Self (Has 24/7 caregiver support) Patient language and need for interpreter reviewed:: Yes Do you feel safe going back to the place where you live?: Yes      Need for Family Participation in Patient Care: Yes (Comment) Care giver support system in place?: Yes (comment) Current home services: DME Criminal Activity/Legal Involvement Pertinent to Current Situation/Hospitalization: No - Comment as  needed  Activities of Daily Living   ADL Screening (condition at time of admission) Independently performs ADLs?: No Does the patient have a NEW difficulty with bathing/dressing/toileting/self-feeding that is expected to last >3 days?: No Does the patient have a NEW difficulty with getting in/out of bed, walking, or climbing stairs that is expected to last >3 days?: No Does the patient have a NEW difficulty with communication that is expected to last >3 days?: No Is the patient deaf or have difficulty hearing?: Yes Does the patient have difficulty seeing, even when wearing glasses/contacts?: No (No hearing aid) Does the patient have difficulty concentrating, remembering, or making decisions?: Yes  Permission Sought/Granted                  Emotional Assessment Appearance:: Appears stated age Attitude/Demeanor/Rapport: Unable to Assess Affect (typically observed): Unable to Assess Orientation: : Oriented to Self, Oriented to Place Alcohol / Substance Use: Not Applicable Psych Involvement: No (comment)  Admission diagnosis:  Hypoglycemia [E16.2] Recurrent episodes of unresponsiveness [R40.4] Patient Active Problem List   Diagnosis Date Noted   Hypoglycemia secondary to sulfonylurea 12/14/2023   Uncontrolled type 2 diabetes mellitus with hypoglycemia, without long-term current use of insulin  (HCC) 12/14/2023   History of GI diverticular bleed requiring emergency embolization 09/2023 12/14/2023   Atrial fibrillation, chronic (HCC) 12/14/2023   Painless jaundice 12/14/2023   Cholestatic jaundice 12/14/2023   GI bleed 09/19/2023   Lower GI bleed 09/15/2023  Common bile duct dilatation 09/15/2023   LFT elevation 09/15/2023   History of CVA (cerebrovascular accident) 06/16/2023   Acute metabolic encephalopathy 06/16/2023   AKI (acute kidney injury) (HCC) 06/16/2023   Hyperglycemia due to type 2 diabetes mellitus (HCC) 06/16/2023   Severe sepsis (HCC) 04/08/2023   Sepsis  secondary to UTI (HCC) 04/05/2023   Diverticulosis of colon 04/05/2023   Cholelithiasis 04/05/2023   Leg swelling 04/05/2023   Fall 02/06/2023   Fall at nursing home, initial encounter 02/05/2023   Closed right hip fracture, initial encounter (HCC) 02/05/2023   Humeral surgical neck fracture 02/05/2023   Atrial flutter (HCC) 01/28/2023   Atrial fibrillation/flutter (HCC) 01/28/2023   Cerebrovascular accident (CVA) (HCC) 01/27/2023   Hypertension 01/26/2023   Hypothyroidism 01/26/2023   Confusion and disorientation / subacute cva. 01/26/2023   Type 2 diabetes mellitus with hyperglycemia, without long-term current use of insulin  (HCC) 08/31/2019   PCP:  Antonio Baumgarten, MD Pharmacy:   [facility] [address] ([address]), [place] - [address] [place] ([place]) [place] [address] Phone: [phone] Fax: [phone]  [facility] - [place], [place] - [address] [place] [address] Phone: [phone] Fax: [phone]     Social Drivers of Health (SDOH) Social History: SDOH Screenings   Food Insecurity: No Food Insecurity (12/14/2023)  Housing: Low Risk  (12/14/2023)  Transportation Needs: No Transportation Needs (12/14/2023)  Utilities: Not At Risk (12/14/2023)  Financial Resource Strain: Low Risk  (09/05/2023)   Received from [facility]  Social Connections: Moderately Isolated (12/14/2023)  Tobacco Use: Medium Risk (10/03/2023)   SDOH Interventions:     Readmission Risk Interventions    02/07/2023    3:03 PM  Readmission Risk Prevention Plan  Transportation Screening Complete  PCP or Specialist Appt within 5-7 Days Complete  Home Care Screening Complete  Medication Review (RN CM) Complete

## 2023-12-15 NOTE — Progress Notes (Addendum)
 Progress Note   Patient: Barbara Butler ZOX:[id number] DOB: 04/15/1930 DOA: 12/13/2023     1 DOS: the patient was seen and examined on 12/15/2023   Brief hospital course: Barbara Butler is a 89 y.o. female with medical history significant for atrial fibrillation on Eliquis , hypertension, type 2 diabetes mellitus, CVA, hypothyroidism, hospitalized in March for diverticular bleed requiring 2 units PRBCs and urgent embolization who is being admitted with hypoglycemia presenting with 2 episodes of decreased responsiveness.  On both occasions her blood sugar was low.  During the episode just prior to arrival she developed gurgling respirations.  EMS was called and they found her blood sugar in the 50s and started her on D10.  History provided by daughter-in-law and caregiver at bedside.  She is both on glimepiride and metformin .  Of note, patient recently had an MRCP 12/03/2023 indicated for painless jaundice noted by her physical therapist.  They were referred to GI based on the results, but according to daughter-in-law, patient was not interested in following up with GI, saying " I am tired" and was not interested in following up.  She states she decided to try to encourage her at a later time. ED course and data review: Mildly tachycardic to 101 with otherwise normal vitals..  By arrival blood sugar 92.  Other labs notable for sodium 127, potassium 2.8, bicarb 18, hemoglobin 10 .EKG with A-fib at 84. Patient was given an amp of D50 and oral potassium and started on a D10 infusion Hospitalist consulted for admission.    Following admission to the hospitalist service, hepatic function panel resulted showing bilirubin of 20, up from 0.5 along with other abnormalities     Assessment and Plan:  * Hypoglycemia secondary to sulfonylurea Episodes of decreased responsiveness secondary to hypoglycemia Non-insulin -dependent type 2 diabetes Resolved Hypoglycemia likely caused by sulfonylurea use in the  setting of beta-blocker use.  Patient initially required D10 but this has since been discontinued since blood sugar stabilized Discontinue sulfonylurea and metformin  Blood sugars AC and at bedtime with sliding scale coverage     Cholestatic jaundice Possible liver failure (TB 20.4, alk phos 693, AST 161, ALT 87) Abnormal MRCP 12/03/2023 MRCP showed "unusually severe intra and extrahepatic biliary ductal dilatation, ...no obvious obstructing calculus or mass". Follow INR Holding Eliquis  Appreciate GI consult for ERCP    Atrial fibrillation, chronic (HCC) Chronic anticoagulation Eliquis  is on hold for planned procedure Metoprolol  is on hold due to blunting of hypoglycemic response    History of GI diverticular bleed requiring emergency embolization 09/2023 Chronic anemia Hemoglobin currently stable-improved from prior   History of CVA (cerebrovascular accident) Stable Apixaban  is on hold for planned procedure and statin on hold due to liver dysfunction   Hypothyroidism Continue levothyroxine      Hyponatremia Most likely secondary to poor oral intake Judicious IV fluid hydration Repeat electrolytes in a.m.    Subjective: Patient is seen and examined at the bedside. Denies having any pain  Physical Exam: Vitals:   12/15/23 0758 12/15/23 0841 12/15/23 1245 12/15/23 1335  BP: (!) 134/91  139/89 (!) 149/102  Pulse: 85  88 83  Resp: 16   16  Temp: 98.7 F (37.1 C)  98.9 F (37.2 C) 97.6 F (36.4 C)  TempSrc: Oral  Oral Temporal  SpO2: 100%  100% 99%  Weight:  52.8 kg    Height:  5\' 2"  (1.575 m)     Vitals and nursing note reviewed.  Constitutional:      General:  She is not in acute distress.    Comments: Frail-appearing elderly female in no distress.  Generalized icterus HENT:     Butler: Normocephalic and atraumatic.  Cardiovascular:     Rate and Rhythm: Normal rate. Rhythm irregular.     Heart sounds: Normal heart sounds.  Pulmonary:     Effort: Pulmonary  effort is normal.     Breath sounds: Normal breath sounds.  Abdominal:     Palpations: Abdomen is soft.     Tenderness: There is no abdominal tenderness.  Skin:    Coloration: Skin is jaundiced.  Neurological:     Mental Status: Mental status is at baseline.     Data Reviewed: Sodium 125 Labs reviewed  Family Communication: Plan of care was discussed with patient's daughter and granddaughter at the bedside.  They verbalized understanding and agreed with the plan. Patient is scheduled for ERCP   Disposition: Status is: Inpatient Remains inpatient appropriate because: For ERCP  Planned Discharge Destination: TBD    Time spent: 40 minutes  Author: Read Camel, MD 12/15/2023 3:02 PM  For on call review [email or web address].

## 2023-12-15 NOTE — Telephone Encounter (Signed)
 ERCP 3 months for stent removal.

## 2023-12-15 NOTE — Consult Note (Signed)
 PHARMACY CONSULT NOTE - FOLLOW UP  Pharmacy Consult for Electrolyte Monitoring and Replacement   Recent Labs: Potassium (mmol/L)  Date Value  12/15/2023 4.4   Magnesium  (mg/dL)  Date Value  82/95/6213 1.9   Calcium  (mg/dL)  Date Value  08/65/7846 8.8 (L)   Albumin (g/dL)  Date Value  96/29/5284 2.5 (L)   Phosphorus (mg/dL)  Date Value  13/24/4010 2.1 (L)   Sodium (mmol/L)  Date Value  12/15/2023 125 (L)     Assessment: 89 y.o. female with medical history significant for atrial fibrillation on Eliquis , hypertension, type 2 diabetes mellitus, CVA, hypothyroidism, hospitalized in March for diverticular bleed requiring 2 units PRBCs and urgent embolization who is being admitted with hypoglycemia presenting with 2 episodes of decreased responsiveness. PTA meds: lasix , olmesartan   Goal of Therapy:  WNL  Plan:  Will replace Phos with NaPhos 15 mmol IV x 1 dose today due to low sodium, NPO status, and unresponsiveness on admission. Follow with AM labs  Barbara Butler PharmD, BCPS 12/15/2023 7:45 AM

## 2023-12-15 NOTE — Transfer of Care (Signed)
 Immediate Anesthesia Transfer of Care Note  Patient: Barbara Butler  Procedure(s) Performed: ERCP, WITH INTERVENTION IF INDICATED  Patient Location: PACU  Anesthesia Type:MAC  Level of Consciousness: awake and alert   Airway & Oxygen Therapy: Patient Spontanous Breathing  Post-op Assessment: Report given to RN  Post vital signs: stable  Last Vitals:  Vitals Value Taken Time  BP 129/91 12/15/23 1539  Temp 36.2 C 12/15/23 1539  Pulse 88 12/15/23 1539  Resp 23 12/15/23 1539  SpO2 100 % 12/15/23 1539    Last Pain:  Vitals:   12/15/23 1539  TempSrc: Temporal  PainSc: Asleep         Complications: No notable events documented.

## 2023-12-16 ENCOUNTER — Encounter: Payer: Self-pay | Admitting: Gastroenterology

## 2023-12-16 DIAGNOSIS — K831 Obstruction of bile duct: Secondary | ICD-10-CM | POA: Diagnosis not present

## 2023-12-16 DIAGNOSIS — R748 Abnormal levels of other serum enzymes: Secondary | ICD-10-CM

## 2023-12-16 DIAGNOSIS — E160 Drug-induced hypoglycemia without coma: Secondary | ICD-10-CM | POA: Diagnosis not present

## 2023-12-16 DIAGNOSIS — Z7189 Other specified counseling: Secondary | ICD-10-CM | POA: Diagnosis not present

## 2023-12-16 DIAGNOSIS — R7989 Other specified abnormal findings of blood chemistry: Secondary | ICD-10-CM | POA: Diagnosis not present

## 2023-12-16 DIAGNOSIS — T383X1A Poisoning by insulin and oral hypoglycemic [antidiabetic] drugs, accidental (unintentional), initial encounter: Secondary | ICD-10-CM | POA: Diagnosis not present

## 2023-12-16 LAB — RENAL FUNCTION PANEL
Albumin: 2.1 g/dL — ABNORMAL LOW (ref 3.5–5.0)
Anion gap: 8 (ref 5–15)
BUN: 17 mg/dL (ref 8–23)
CO2: 19 mmol/L — ABNORMAL LOW (ref 22–32)
Calcium: 8.7 mg/dL — ABNORMAL LOW (ref 8.9–10.3)
Chloride: 105 mmol/L (ref 98–111)
Creatinine, Ser: 0.77 mg/dL (ref 0.44–1.00)
GFR, Estimated: >60 mL/min (ref >60)
Glucose, Bld: 91 mg/dL (ref 70–99)
Phosphorus: 3.9 mg/dL (ref 2.5–4.6)
Potassium: 4.5 mmol/L (ref 3.5–5.1)
Sodium: 131 mmol/L — ABNORMAL LOW (ref 135–145)

## 2023-12-16 LAB — HEPATIC FUNCTION PANEL
ALT: 82 U/L — ABNORMAL HIGH (ref 0–44)
AST: 110 U/L — ABNORMAL HIGH (ref 15–41)
Albumin: 2.1 g/dL — ABNORMAL LOW (ref 3.5–5.0)
Alkaline Phosphatase: 585 U/L — ABNORMAL HIGH (ref 38–126)
Bilirubin, Direct: 7.3 mg/dL — ABNORMAL HIGH (ref 0.0–0.2)
Indirect Bilirubin: 6.4 mg/dL — ABNORMAL HIGH (ref 0.3–0.9)
Total Bilirubin: 13.7 mg/dL — ABNORMAL HIGH (ref 0.0–1.2)
Total Protein: 5.3 g/dL — ABNORMAL LOW (ref 6.5–8.1)

## 2023-12-16 LAB — GLUCOSE, CAPILLARY
Glucose-Capillary: 98 mg/dL (ref 70–99)
Glucose-Capillary: 96 mg/dL (ref 70–99)
Glucose-Capillary: 174 mg/dL — ABNORMAL HIGH (ref 70–99)
Glucose-Capillary: 180 mg/dL — ABNORMAL HIGH (ref 70–99)

## 2023-12-16 LAB — MAGNESIUM: Magnesium: 2 mg/dL (ref 1.7–2.4)

## 2023-12-16 LAB — HEMOGLOBIN A1C
Hgb A1c MFr Bld: 4.6 % — ABNORMAL LOW (ref 4.8–5.6)
Mean Plasma Glucose: 85 mg/dL

## 2023-12-16 LAB — SURGICAL PATHOLOGY

## 2023-12-16 NOTE — Progress Notes (Signed)
 Progress Note   Patient: Barbara Butler QMV:[id number] DOB: 11-30-1929 DOA: 12/13/2023     2 DOS: the patient was seen and examined on 12/16/2023   Brief hospital course: KANNON BAUM is a 89 y.o. female with medical history significant for atrial fibrillation on Eliquis , hypertension, type 2 diabetes mellitus, CVA, hypothyroidism, hospitalized in March for diverticular bleed requiring 2 units PRBCs and urgent embolization who is being admitted with hypoglycemia presenting with 2 episodes of decreased responsiveness.  On both occasions her blood sugar was low.  During the episode just prior to arrival she developed gurgling respirations.  EMS was called and they found her blood sugar in the 50s and started her on D10.  History provided by daughter-in-law and caregiver at bedside.  She is both on glimepiride and metformin .  Of note, patient recently had an MRCP 12/03/2023 indicated for painless jaundice noted by her physical therapist.  They were referred to GI based on the results, but according to daughter-in-law, patient was not interested in following up with GI, saying " I am tired" and was not interested in following up.  She states she decided to try to encourage her at a later time. ED course and data review: Mildly tachycardic to 101 with otherwise normal vitals..  By arrival blood sugar 92.  Other labs notable for sodium 127, potassium 2.8, bicarb 18, hemoglobin 10 .EKG with A-fib at 84. Patient was given an amp of D50 and oral potassium and started on a D10 infusion Hospitalist consulted for admission.    Following admission to the hospitalist service, hepatic function panel resulted showing bilirubin of 20, up from 0.5 along with other abnormalities       Assessment and Plan:  * Hypoglycemia secondary to sulfonylurea Episodes of decreased responsiveness secondary to hypoglycemia Non-insulin -dependent type 2 diabetes Resolved Hypoglycemia likely caused by sulfonylurea use in the  setting of beta-blocker use.  Patient initially required D10 but this has since been discontinued since blood sugar stabilized Hemoglobin A1c is 4.6 Discontinue sulfonylurea and metformin  on discharge Blood sugars AC and at bedtime with sliding scale coverage       Cholestatic jaundice Possible liver failure (TB 20.4, alk phos 693, AST 161, ALT 87) Abnormal MRCP 12/03/2023 MRCP showed "unusually severe intra and extrahepatic biliary ductal dilatation, ...no obvious obstructing calculus or mass". Patient is status post ERCP with a stent placed in her common bile duct due to a distal common bile duct stricture. Surgical pathology DUODENAL MUCOSA WITH NO SPECIFIC PATHOLOGIC DIAGNOSIS.       - NEGATIVE FOR ADENOMATOUS CHANGE AND MALIGNANCY.       - NEGATIVE FOR PATHOLOGIC INTRAEPITHELIAL LYMPHOCYTOSIS.  Noted to have a drop in her total bilirubin from 23-13 Follow-up with GI in 3 months for repeat ERCP for possible stent removal or possibly a permanent metal stent     Atrial fibrillation, chronic (HCC) Chronic anticoagulation Eliquis  was placed on hold for planned procedure Metoprolol  is on hold due to blunting of hypoglycemic response Heart rate is stable Will resume Eliquis  in a.m.     History of GI diverticular bleed requiring emergency embolization 09/2023 Chronic anemia Hemoglobin currently stable-improved from prior   History of CVA (cerebrovascular accident) Stable Apixaban  is on hold for planned procedure and statin on hold due to liver dysfunction    Hypothyroidism Continue levothyroxine      Hyponatremia Most likely secondary to poor oral intake Improved with IV fluid hydration    Physical deconditioning PT evaluation  Subjective: Patient is seen and examined at the bedside. No new complaints  Physical Exam: Vitals:   12/15/23 2316 12/16/23 0335 12/16/23 0800 12/16/23 1233  BP: 136/75 (!) 125/92 127/88 (!) 135/90  Pulse: 85 68 87 85  Resp: 16 18     Temp: (!) 97.5 F (36.4 C) (!) 97.5 F (36.4 C) 97.7 F (36.5 C) 98.4 F (36.9 C)  TempSrc: Oral Oral Oral Oral  SpO2: 98% 100% 95% 100%  Weight:      Height:       Constitutional:      General: She is not in acute distress.    Comments: Frail-appearing elderly female in no distress.  Generalized icterus HENT:     Head: Normocephalic and atraumatic.  Cardiovascular:     Rate and Rhythm: Normal rate. Rhythm irregular.     Heart sounds: Normal heart sounds.  Pulmonary:     Effort: Pulmonary effort is normal.     Breath sounds: Normal breath sounds.  Abdominal:     Palpations: Abdomen is soft.     Tenderness: There is no abdominal tenderness.  Skin:    Coloration: Skin is jaundiced.  Neurological:     Mental Status: Mental status is at baseline.      Data Reviewed: Sodium 131, bicarb 19, total bilirubin 13.7 Labs reviewed  Family Communication: Discussed patient's condition and plan of care with Rana Burr over the phone.  All questions and concerns have been addressed.  She verbalizes understanding and agrees with the plan.  Disposition: Status is: Inpatient Remains inpatient appropriate because: Needs PT evaluation.  Possible discharge in a.m.  Planned Discharge Destination: TBD    Time spent: 40 minutes  Author: Read Camel, MD 12/16/2023 1:45 PM  For on call review [email or web address].

## 2023-12-16 NOTE — Consult Note (Signed)
 PHARMACY CONSULT NOTE - FOLLOW UP  Pharmacy Consult for Electrolyte Monitoring and Replacement   Recent Labs: Potassium (mmol/L)  Date Value  12/16/2023 4.5   Magnesium  (mg/dL)  Date Value  04/54/0981 2.0   Calcium  (mg/dL)  Date Value  19/14/7829 8.7 (L)   Albumin (g/dL)  Date Value  56/21/3086 2.1 (L)   Phosphorus (mg/dL)  Date Value  57/84/6962 3.9   Sodium (mmol/L)  Date Value  12/16/2023 131 (L)   Assessment: 89 y.o. female with medical history significant for atrial fibrillation on Eliquis , hypertension, type 2 diabetes mellitus, CVA, hypothyroidism, hospitalized in March for diverticular bleed requiring 2 units PRBCs and urgent embolization who is being admitted with hypoglycemia presenting with 2 episodes of decreased responsiveness. PTA meds: lasix , olmesartan   Goal of Therapy:  WNL  Plan:  HypoNa improving (Na 125->>131),all other electrolytes WNL. NO additional replacement needed today. Follow with AM labs  Crystie Yanko Rodriguez-Guzman PharmD, BCPS 12/16/2023 8:28 AM

## 2023-12-16 NOTE — Progress Notes (Signed)
    Marnee Sink, MD [facility]   [address], [address] [place], [place] [address] Phone: [phone] Fax : [phone]   Subjective: This patient is status post ERCP with a stent placed in her common bile duct due to a distal common bile duct stricture.  The biopsies of the ampulla did not show any signs of an adenoma.  The patient would not be a candidate for any extensive surgery if an ampullary adenoma was found.  The patient's bilirubin had gone from 20 to 23 prior to the the ERCP and then today is 13.7.   Objective: Vital signs in last 24 hours: Vitals:   12/15/23 1916 12/15/23 2316 12/16/23 0335 12/16/23 0800  BP: (!) 120/90 136/75 (!) 125/92 127/88  Pulse: 88 85 68 87  Resp: 16 16 18    Temp: 97.9 F (36.6 C) (!) 97.5 F (36.4 C) (!) 97.5 F (36.4 C) 97.7 F (36.5 C)  TempSrc: Oral Oral Oral Oral  SpO2: 100% 98% 100% 95%  Weight:      Height:       Weight change:   Intake/Output Summary (Last 24 hours) at 12/16/2023 1115 Last data filed at 12/16/2023 0849 Gross per 24 hour  Intake 985.54 ml  Output 350 ml  Net 635.54 ml     Exam: Heart:: Regular rate and rhythm or without murmur or extra heart sounds Lungs: normal and clear to auscultation and percussion Abdomen: soft, nontender, normal bowel sounds   Lab Results: @LABTEST2 @ Micro Results: No results found for this or any previous visit (from the past 240 hours). Studies/Results: DG C-Arm 1-60 Min-No Report Result Date: 12/15/2023 Fluoroscopy was utilized by the requesting physician.  No radiographic interpretation.   Medications: I have reviewed the patient's current medications. Scheduled Meds:  insulin  aspart  0-9 Units Subcutaneous TID WC   Continuous Infusions: PRN Meds:.acetaminophen  **OR** acetaminophen , dextrose , ondansetron  **OR** ondansetron  (ZOFRAN ) IV   Assessment: Principal Problem:   Hypoglycemia secondary to sulfonylurea Active Problems:   Hypertension   Hypothyroidism   History of  CVA (cerebrovascular accident)   Acute metabolic encephalopathy   Uncontrolled type 2 diabetes mellitus with hypoglycemia, without long-term current use of insulin  (HCC)   History of GI diverticular bleed requiring emergency embolization 09/2023   Atrial fibrillation, chronic (HCC)   Painless jaundice   Cholestatic jaundice   Palliative care by specialist   Occlusion of bile duct    Plan: This patient is status post ERCP with stent placement in the common bile duct with significant improvement in her liver enzymes and bilirubin.  Nothing further to do from a GI point of view and the patient will need to have the stent removed in 3 months whereupon we can decide on whether putting in a metal stent that can stay permanently.  My office will contact the patient for the repeat a ERCP in 3 months.  I will sign off.  Please call if any further GI concerns or questions.  We would like to thank you for the opportunity to participate in the care of Barbara Butler.    LOS: 2 days   Marnee Sink, MD.FACG 12/16/2023, 11:15 AM Pager [phone] 7am-5pm  Check AMION for 5pm -7am coverage and on weekends

## 2023-12-16 NOTE — Evaluation (Signed)
 Physical Therapy Evaluation Patient Details Name: Barbara Butler MRN: [id number] DOB: [birth year] Today's Date: 12/16/2023  History of Present Illness  Pt is a 89 y.o. female with a history of type 2 diabetes, hypertension, hypothyroidism, atrial flutter on eliquis  and lower GI bleed who presented to ED with episodes of altered mental status. MD diagnosis includes hypoglycemia, cholestatic jaundice, abnormal MRCP, hypothyroidism, hyponatremia, and physical deconditioning.  Clinical Impression  Pt was pleasant and motivated to participate during the session and put forth good effort throughout. Pt required min assist for BLE, trunk control, and cuing for proper sequencing when coming from supine to sitting EOB. Pt required min assist with sit to stand from slightly elevated EOB with RW. Pt ambulated 5 feet at EOB, then from EOB to chair with min assist and RW. Pt reported fatigue upon sitting in chair after ambulation and declined additional gait activities. Pt will benefit from continued PT services upon discharge to safely address deficits listed in patient problem list for decreased caregiver assistance and eventual return to PLOF.      If plan is discharge home, recommend the following: A lot of help with walking and/or transfers;A lot of help with bathing/dressing/bathroom;Two people to help with bathing/dressing/bathroom;Assistance with cooking/housework;Assist for transportation;Direct supervision/assist for medications management;Direct supervision/assist for financial management;Help with stairs or ramp for entrance   Can travel by private vehicle        Equipment Recommendations None recommended by PT  Recommendations for Other Services       Functional Status Assessment Patient has had a recent decline in their functional status and demonstrates the ability to make significant improvements in function in a reasonable and predictable amount of time.     Precautions / Restrictions  Precautions Precautions: None Restrictions Weight Bearing Restrictions Per Provider Order: No      Mobility  Bed Mobility Overal bed mobility: Needs Assistance Bed Mobility: Supine to Sit     Supine to sit: Min assist     General bed mobility comments: min assist for BLE, trunk control, and cuing for proper sequencing    Transfers Overall transfer level: Needs assistance Equipment used: Rolling walker (2 wheels) Transfers: Sit to/from Stand Sit to Stand: From elevated surface, Min assist           General transfer comment: pt able to stand from mildly elevated EOB without physical assistance and without use of UE assist. Upon coming into initial stand, pt required min assist to prevent posterior LOB.    Ambulation/Gait Ambulation/Gait assistance: Contact guard assist Gait Distance (Feet): 5 Feet Assistive device: Rolling walker (2 wheels) Gait Pattern/deviations: Step-to pattern, Decreased step length - right, Decreased step length - left, Trunk flexed Gait velocity: decreased     General Gait Details: min cues for general sequencing with RW and upright posture. Effortful steps with bilateral step length, but generally steady with no overt LOB  Stairs            Wheelchair Mobility     Tilt Bed    Modified Rankin (Stroke Patients Only)       Balance Overall balance assessment: Needs assistance Sitting-balance support: Bilateral upper extremity supported Sitting balance-Leahy Scale: Poor Sitting balance - Comments: pt required BUE support to maintain balance in sitting at EOB     Standing balance-Leahy Scale: Poor Standing balance comment: fair to poor stability with pt needing min assist to prevent posterior LOB when coming to initial stand, but no LOB otherwise  Pertinent Vitals/Pain Pain Assessment Pain Assessment: No/denies pain    Home Living Family/patient expects to be discharged to:: Private  residence Living Arrangements: Alone Available Help at Discharge: Personal care attendant;Available 24 hours/day;Family Type of Home: House Home Access: Stairs to enter;Ramped entrance (has ramped entrance at back of home) Entrance Stairs-Rails: Right;Left;Can reach both Entrance Stairs-Number of Steps: 5   Home Layout: One level Home Equipment: Cane - single Librarian, academic (2 wheels); wheelchair      Prior Function Prior Level of Function : Needs assist       Physical Assist : Mobility (physical);ADLs (physical) Mobility (physical): Bed mobility;Transfers;Gait ADLs (physical): Grooming;Bathing;Dressing;Toileting Mobility Comments: Pt stated that caregiver that is available 24/7 aides in mobility at home (CGA), pt utilizes RW for household ambulation only with CGA from caregiver/family, no previous fall hx ADLs Comments: Caregiver and family assists pt with ADLs     Extremity/Trunk Assessment   Upper Extremity Assessment Upper Extremity Assessment: Generalized weakness    Lower Extremity Assessment Lower Extremity Assessment: Generalized weakness       Communication   Communication Communication: Impaired Factors Affecting Communication: Hearing impaired    Cognition Arousal: Alert Behavior During Therapy: WFL for tasks assessed/performed   PT - Cognitive impairments: No apparent impairments                         Following commands: Intact (HOH)       Cueing Cueing Techniques: Verbal cues     General Comments      Exercises     Assessment/Plan    PT Assessment Patient needs continued PT services  PT Problem List Decreased strength;Decreased mobility;Decreased safety awareness;Decreased activity tolerance;Decreased balance;Decreased knowledge of use of DME       PT Treatment Interventions DME instruction;Therapeutic exercise;Gait training;Balance training;Functional mobility training;Therapeutic activities;Patient/family education     PT Goals (Current goals can be found in the Care Plan section)  Acute Rehab PT Goals Patient Stated Goal: to be able to walk around independently at home PT Goal Formulation: With patient Time For Goal Achievement: 12/29/23 Potential to Achieve Goals: Fair    Frequency Min 2X/week     Co-evaluation               AM-PAC PT "6 Clicks" Mobility  Outcome Measure Help needed turning from your back to your side while in a flat bed without using bedrails?: A Lot Help needed moving from lying on your back to sitting on the side of a flat bed without using bedrails?: A Lot Help needed moving to and from a bed to a chair (including a wheelchair)?: A Little Help needed standing up from a chair using your arms (e.g., wheelchair or bedside chair)?: A Little Help needed to walk in hospital room?: A Lot Help needed climbing 3-5 steps with a railing? : A Lot 6 Click Score: 14    End of Session Equipment Utilized During Treatment: Gait belt Activity Tolerance: Patient limited by fatigue Patient left: with chair alarm set;in chair;with call bell/phone within reach;with family/visitor present Nurse Communication: Mobility status PT Visit Diagnosis: Unsteadiness on feet (R26.81);Difficulty in walking, not elsewhere classified (R26.2);Muscle weakness (generalized) (M62.81)    Time: 4098-1191 PT Time Calculation (min) (ACUTE ONLY): 26 min   Charges:                 Rhoda Centers, SPT 12/16/23, 3:55 PM

## 2023-12-16 NOTE — Progress Notes (Signed)
  Daily Progress Note   Date: 12/16/2023   Patient Name: Barbara Butler  DOB: 1930/01/17  MRN: [id number]  Age / Sex: 89 y.o., female  Attending Physician: Read Camel, MD Primary Care Physician: Antonio Baumgarten, MD Admit Date: 12/13/2023 Length of Stay: 2 days  Reason for Consultation: {Reason for Consult:23484}  Past Medical History:  Diagnosis Date  . Anemia   . Atrial fibrillation (HCC)   . Diabetes mellitus without complication (HCC)    diet controlled  . Diabetic neuropathy (HCC)   . Dysrhythmia   . Hypertension   . Hypothyroidism   . Stroke [facility]) 01/2023   multiple strokes found at this time    Subjective:   Subjective: Chart Reviewed. Updates received. Patient Assessed. Created space and opportunity for patient  and family to explore thoughts and feelings regarding current medical situation.  Today's Discussion: Today before meeting with the patient/family, I reviewed the chart including ***. ***  Review of Systems  Objective:   Primary Diagnoses: Present on Admission: . Hypoglycemia secondary to sulfonylurea . Acute metabolic encephalopathy . Hypothyroidism . Hypertension   Vital Signs:  BP (!) 135/90 (BP Location: Left Arm)   Pulse 85   Temp 98.4 F (36.9 C) (Oral)   Resp 18   Ht 5\' 2"  (1.575 m)   Wt 52.8 kg   SpO2 100%   BMI 21.29 kg/m   Physical Exam  Palliative Assessment/Data: ***   Existing Vynca/ACP Documentation: ***  Advanced Care Planning:   Existing Vynca/ACP Documentation: ***  Primary Decision Maker: {Primary Decision GNFAO:13086}  Pertinent diagnosis: ***  The patient and/or family consented to a voluntary Advance Care Planning Conversation in person/over the phone***. Individuals present for the conversation:  Summary of the conversation: ***  Outcome of the conversations and/or documents completed: ***  I spent *** minutes providing separately identifiable ACP services with the patient and/or surrogate  decision maker in a voluntary, in-person conversation discussing the patient's wishes and goals as detailed in the above note.  Assessment & Plan:   HPI/Patient Profile:  ***  SUMMARY OF RECOMMENDATIONS   ***  Symptom Management:  ***  Code Status: {Palliative Code status:23503}  Prognosis: {Palliative Care Prognosis:23504}  Discharge Planning: {Palliative dispostion:23505}  Discussed with: ***  Thank you for allowing us  to participate in the care of Cleaster Curie PMT will continue to support holistically.  Time Total: ***  Detailed review of medical records (labs, imaging, vital signs), medically appropriate exam, discussed with treatment team, counseling and education to patient, family, & staff, documenting clinical information, medication management, coordination of care  Lizbeth Right, NP Palliative Medicine Team  Team Phone # [phone] (Nights/Weekends)  03/06/2021, 8:17 AM

## 2023-12-16 NOTE — Anesthesia Postprocedure Evaluation (Signed)
 Anesthesia Post Note  Patient: Barbara Butler  Procedure(s) Performed: ERCP, WITH INTERVENTION IF INDICATED  Patient location during evaluation: Endoscopy Anesthesia Type: General Level of consciousness: awake and alert Pain management: pain level controlled Vital Signs Assessment: post-procedure vital signs reviewed and stable Respiratory status: spontaneous breathing, nonlabored ventilation, respiratory function stable and patient connected to nasal cannula oxygen Cardiovascular status: blood pressure returned to baseline and stable Postop Assessment: no apparent nausea or vomiting Anesthetic complications: no   No notable events documented.   Last Vitals:  Vitals:   12/15/23 2316 12/16/23 0335  BP: 136/75 (!) 125/92  Pulse: 85 68  Resp: 16 18  Temp: (!) 36.4 C (!) 36.4 C  SpO2: 98% 100%    Last Pain:  Vitals:   12/16/23 0335  TempSrc: Oral  PainSc:                  Portia Brittle Debria Broecker

## 2023-12-16 NOTE — Care Management Important Message (Signed)
 Important Message  Patient Details  Name: Barbara Butler MRN: [id number] Date of Birth: 12-07-1929   Important Message Given:  Yes - Medicare IM     Anise Kerns 12/16/2023, 4:36 PM

## 2023-12-17 DIAGNOSIS — Z8673 Personal history of transient ischemic attack (TIA), and cerebral infarction without residual deficits: Secondary | ICD-10-CM

## 2023-12-17 DIAGNOSIS — R17 Unspecified jaundice: Secondary | ICD-10-CM

## 2023-12-17 DIAGNOSIS — G9341 Metabolic encephalopathy: Secondary | ICD-10-CM

## 2023-12-17 DIAGNOSIS — I482 Chronic atrial fibrillation, unspecified: Secondary | ICD-10-CM

## 2023-12-17 DIAGNOSIS — Z515 Encounter for palliative care: Secondary | ICD-10-CM

## 2023-12-17 DIAGNOSIS — E11649 Type 2 diabetes mellitus with hypoglycemia without coma: Secondary | ICD-10-CM

## 2023-12-17 DIAGNOSIS — I10 Essential (primary) hypertension: Secondary | ICD-10-CM

## 2023-12-17 DIAGNOSIS — R748 Abnormal levels of other serum enzymes: Secondary | ICD-10-CM

## 2023-12-17 DIAGNOSIS — T383X1A Poisoning by insulin and oral hypoglycemic [antidiabetic] drugs, accidental (unintentional), initial encounter: Secondary | ICD-10-CM | POA: Diagnosis not present

## 2023-12-17 DIAGNOSIS — K831 Obstruction of bile duct: Secondary | ICD-10-CM | POA: Diagnosis not present

## 2023-12-17 DIAGNOSIS — Z8719 Personal history of other diseases of the digestive system: Secondary | ICD-10-CM

## 2023-12-17 LAB — HEPATIC FUNCTION PANEL
ALT: 75 U/L — ABNORMAL HIGH (ref 0–44)
AST: 90 U/L — ABNORMAL HIGH (ref 15–41)
Albumin: 2.2 g/dL — ABNORMAL LOW (ref 3.5–5.0)
Alkaline Phosphatase: 638 U/L — ABNORMAL HIGH (ref 38–126)
Bilirubin, Direct: 5.5 mg/dL — ABNORMAL HIGH (ref 0.0–0.2)
Indirect Bilirubin: 5.6 mg/dL — ABNORMAL HIGH (ref 0.3–0.9)
Total Bilirubin: 11.1 mg/dL — ABNORMAL HIGH (ref 0.0–1.2)
Total Protein: 5.5 g/dL — ABNORMAL LOW (ref 6.5–8.1)

## 2023-12-17 LAB — BASIC METABOLIC PANEL WITH GFR
Anion gap: 8 (ref 5–15)
BUN: 16 mg/dL (ref 8–23)
CO2: 20 mmol/L — ABNORMAL LOW (ref 22–32)
Calcium: 8.7 mg/dL — ABNORMAL LOW (ref 8.9–10.3)
Chloride: 103 mmol/L (ref 98–111)
Creatinine, Ser: 0.76 mg/dL (ref 0.44–1.00)
GFR, Estimated: >60 mL/min (ref >60)
Glucose, Bld: 131 mg/dL — ABNORMAL HIGH (ref 70–99)
Potassium: 4.6 mmol/L (ref 3.5–5.1)
Sodium: 131 mmol/L — ABNORMAL LOW (ref 135–145)

## 2023-12-17 LAB — GLUCOSE, CAPILLARY
Glucose-Capillary: 138 mg/dL — ABNORMAL HIGH (ref 70–99)
Glucose-Capillary: 211 mg/dL — ABNORMAL HIGH (ref 70–99)
Glucose-Capillary: 198 mg/dL — ABNORMAL HIGH (ref 70–99)
Glucose-Capillary: 160 mg/dL — ABNORMAL HIGH (ref 70–99)

## 2023-12-17 LAB — PROTIME-INR
INR: 1.3 — ABNORMAL HIGH (ref 0.8–1.2)
Prothrombin Time: 16.6 s — ABNORMAL HIGH (ref 11.4–15.2)

## 2023-12-17 MED ORDER — APIXABAN 2.5 MG PO TABS
2.5000 mg | ORAL_TABLET | Freq: Two times a day (BID) | ORAL | Status: DC
  Administered 2023-12-18: 2.5 mg via ORAL
  Filled 2023-12-17: qty 1

## 2023-12-17 NOTE — TOC Progression Note (Signed)
 Transition of Care [facility]) - Progression Note    Patient Details  Name: Barbara Butler MRN: [id number] Date of Birth: 02/16/1930  [facility] CM/SW Contact  Odilia Bennett, LCSW Phone Number: 12/17/2023, 11:09 AM  Clinical Narrative:   No family at bedside. CSW called daughter-in-law, Devra Fontana. She is agreeable to home health. First preference is [facility]. They have accepted referral for PT, RN. [name] is aware. She confirmed that she would transport patient home at discharge.  Expected Discharge Plan: Home/Self Care (with personal care services) Barriers to Discharge: Continued Medical Work up  Expected Discharge Plan and Services     Post Acute Care Choice: Resumption of Svcs/PTA Provider Living arrangements for the past 2 months: Single Family Home                                       Social Determinants of Health (SDOH) Interventions SDOH Screenings   Food Insecurity: No Food Insecurity (12/14/2023)  Housing: Low Risk  (12/14/2023)  Transportation Needs: No Transportation Needs (12/14/2023)  Utilities: Not At Risk (12/14/2023)  Financial Resource Strain: Low Risk  (09/05/2023)   Received from [facility]  Social Connections: Moderately Isolated (12/14/2023)  Tobacco Use: Medium Risk (12/15/2023)    Readmission Risk Interventions    02/07/2023    3:03 PM  Readmission Risk Prevention Plan  Transportation Screening Complete  PCP or Specialist Appt within 5-7 Days Complete  Home Care Screening Complete  Medication Review (RN CM) Complete

## 2023-12-17 NOTE — Plan of Care (Signed)

## 2023-12-17 NOTE — Consult Note (Signed)
 PHARMACY CONSULT NOTE - FOLLOW UP  Pharmacy Consult for Electrolyte Monitoring and Replacement   Recent Labs: Potassium (mmol/L)  Date Value  12/17/2023 4.6   Magnesium  (mg/dL)  Date Value  16/04/9603 2.0   Calcium  (mg/dL)  Date Value  54/03/8118 8.7 (L)   Albumin (g/dL)  Date Value  14/78/2956 2.1 (L)  12/16/2023 2.1 (L)   Phosphorus (mg/dL)  Date Value  21/30/8657 3.9   Sodium (mmol/L)  Date Value  12/17/2023 131 (L)   Assessment: 89 y.o. female with medical history significant for atrial fibrillation on Eliquis , hypertension, type 2 diabetes mellitus, CVA, hypothyroidism, hospitalized in March for diverticular bleed requiring 2 units PRBCs and urgent embolization who is being admitted with hypoglycemia presenting with 2 episodes of decreased responsiveness. PTA meds: lasix , olmesartan   Corrected calcium : 10.22   Goal of Therapy:  WNL  Plan:  HypoNa improving (Na 125->>131),all other electrolytes WNL. NO additional replacement needed today. Follow with AM labs  Teesha Ohm Rodriguez-Guzman PharmD, BCPS 12/17/2023 8:14 AM

## 2023-12-17 NOTE — Progress Notes (Signed)
 Progress Note   Patient: Barbara Butler ZOX:[id number] DOB: 1930-04-14 DOA: 12/13/2023     3 DOS: the patient was seen and examined on 12/17/2023   Brief hospital course: QUISHA MABIE is a 89 y.o. female with medical history significant for atrial fibrillation on Eliquis , hypertension, type 2 diabetes mellitus, CVA, hypothyroidism, hospitalized in March for diverticular bleed requiring 2 units PRBCs and urgent embolization who is being admitted with hypoglycemia presenting with 2 episodes of decreased responsiveness.  On both occasions her blood sugar was low.  During the episode just prior to arrival she developed gurgling respirations.  EMS was called and they found her blood sugar in the 50s and started her on D10.  History provided by daughter-in-law and caregiver at bedside.  She is both on glimepiride and metformin .  Of note, patient recently had an MRCP 12/03/2023 indicated for painless jaundice noted by her physical therapist.  They were referred to GI based on the results, but according to daughter-in-law, patient was not interested in following up with GI, saying  I am tired and was not interested in following up.  She states she decided to try to encourage her at a later time. ED course and data review: Mildly tachycardic to 101 with otherwise normal vitals..  By arrival blood sugar 92.  Other labs notable for sodium 127, potassium 2.8, bicarb 18, hemoglobin 10 .EKG with A-fib at 84. Patient was given an amp of D50 and oral potassium and started on a D10 infusion Hospitalist consulted for admission.    Following admission to the hospitalist service, hepatic function panel resulted showing bilirubin of 20, up from 0.5 along with other abnormalities.  6/11: S/p ERCP and stent placement, liver function and T. bili started improving.  Advancing diet to soft today GI is recommending repeat ERCP in 56-month PT is recommending home health which was ordered     Assessment and Plan:  *  Hypoglycemia secondary to sulfonylurea Episodes of decreased responsiveness secondary to hypoglycemia Non-insulin -dependent type 2 diabetes Resolved Hypoglycemia likely caused by sulfonylurea use in the setting of beta-blocker use.  Patient initially required D10 but this has since been discontinued since blood sugar stabilized Hemoglobin A1c is 4.6 Discontinue sulfonylurea and metformin  on discharge Blood sugars AC and at bedtime with sliding scale coverage    Cholestatic jaundice Possible liver failure (TB 20.4, alk phos 693, AST 161, ALT 87) Abnormal MRCP 12/03/2023 MRCP showed unusually severe intra and extrahepatic biliary ductal dilatation, ...no obvious obstructing calculus or mass. Patient is status post ERCP with a stent placed in her common bile duct due to a distal common bile duct stricture. Surgical pathology DUODENAL MUCOSA WITH NO SPECIFIC PATHOLOGIC DIAGNOSIS.       - NEGATIVE FOR ADENOMATOUS CHANGE AND MALIGNANCY.       - NEGATIVE FOR PATHOLOGIC INTRAEPITHELIAL LYMPHOCYTOSIS.  Noted to have a drop in her total bilirubin from 23-11 Follow-up with GI in 3 months for repeat ERCP for possible stent removal or possibly a permanent metal stent     Atrial fibrillation, chronic (HCC) Chronic anticoagulation Eliquis  was placed on hold for planned procedure and elevated INR-will resume from tomorrow if remains stable Metoprolol  is on hold due to blunting of hypoglycemic response Heart rate is stable Will resume Eliquis  in a.m.   History of GI diverticular bleed requiring emergency embolization 09/2023 Chronic anemia Hemoglobin currently stable-improved from prior   History of CVA (cerebrovascular accident) Stable Apixaban  is on hold for planned procedure and statin on hold  due to liver dysfunction   Hypothyroidism Continue levothyroxine     Hyponatremia Most likely secondary to poor oral intake Sodium at 131 today - Continue to monitor   Physical  deconditioning PT evaluation-recommending home health       Subjective: Patient was seen and examined today.  No nausea, vomiting or pain.  Physical Exam: Vitals:   12/17/23 0034 12/17/23 0420 12/17/23 1042 12/17/23 1557  BP: (!) 144/90 (!) 143/96 (!) 149/86 133/86  Pulse: 82 98 99 96  Resp: 19 18 20 18   Temp: 97.6 F (36.4 C) (!) 97.5 F (36.4 C) 97.7 F (36.5 C) 98.4 F (36.9 C)  TempSrc:      SpO2: 100% 100% 97% 99%  Weight:      Height:       General.  Frail and hard of hearing elderly lady, in no acute distress.  Positive scleral icterus and skin pallor Pulmonary.  Lungs clear bilaterally, normal respiratory effort. CV.  Regular rate and rhythm, no JVD, rub or murmur. Abdomen.  Soft, nontender, nondistended, BS positive. CNS.  Alert and oriented .  No focal neurologic deficit. Extremities.  No edema, no cyanosis, pulses intact and symmetrical.   Data Reviewed: Prior data reviewed  Family Communication: Discussed with daughter-in-law at bedside  Disposition: Status is: Inpatient Remains inpatient appropriate because: Severity of illness  Planned Discharge Destination: Home with home health  DVT prophylaxis.  Eliquis  Time spent: 50 minutes  This record has been created using Conservation officer, historic buildings. Errors have been sought and corrected,but may not always be located. Such creation errors do not reflect on the standard of care.   Author: Luna Salinas, MD 12/17/2023 5:01 PM  For on call review [email or web address].

## 2023-12-17 NOTE — Progress Notes (Signed)
 Physical Therapy Treatment Patient Details Name: Barbara Butler MRN: [id number] DOB: January 14, 1930 Today's Date: 12/17/2023   History of Present Illness Pt is a 89 y.o. female with a history of type 2 diabetes, hypertension, hypothyroidism, atrial flutter on eliquis  and lower GI bleed who presented to ED with episodes of altered mental status. MD diagnosis includes hypoglycemia, cholestatic jaundice, abnormal MRCP, hypothyroidism, hyponatremia, and physical deconditioning.    PT Comments  Pt was pleasant and motivated to participate during the session and put forth good effort throughout Pt required min assist coming STS with RW from recliner. Pt ambulated 3 feet to BSC with RW and CGA. Pt tolerated 2 bouts of static standing for 3 minutes each with min assist coming to standing and CGA in standing during pericare. Pt ambulated 3 feet from [facility] to recliner with CGA. Pt will benefit from continued PT services upon discharge to safely address deficits listed in patient problem list for decreased caregiver assistance and eventual return to PLOF.         If plan is discharge home, recommend the following: A lot of help with walking and/or transfers;A lot of help with bathing/dressing/bathroom;Two people to help with bathing/dressing/bathroom;Assistance with cooking/housework;Assist for transportation;Direct supervision/assist for medications management;Direct supervision/assist for financial management;Help with stairs or ramp for entrance   Can travel by private vehicle        Equipment Recommendations  None recommended by PT    Recommendations for Other Services       Precautions / Restrictions Precautions Precautions: None Restrictions Weight Bearing Restrictions Per Provider Order: No     Mobility  Bed Mobility               General bed mobility comments: Not assessed; pt received in chair    Transfers Overall transfer level: Needs assistance Equipment used: Rolling walker (2  wheels) Transfers: Sit to/from Stand Sit to Stand: Min assist           General transfer comment: Pt able to complete STS with min assist and RW from recliner    Ambulation/Gait Ambulation/Gait assistance: Contact guard assist Gait Distance (Feet): 3 Feet x 2 Assistive device: Rolling walker (2 wheels) Gait Pattern/deviations: Step-to pattern, Decreased step length - right, Decreased step length - left, Trunk flexed, Decreased stride length Gait velocity: decreased     General Gait Details: VC's to extend trunk with no carryover. Minimal stride length, but remained steady with no LOB   Stairs             Wheelchair Mobility     Tilt Bed    Modified Rankin (Stroke Patients Only)       Balance Overall balance assessment: Needs assistance Sitting-balance support: Bilateral upper extremity supported Sitting balance-Leahy Scale: Poor Sitting balance - Comments: pt required BUE support to maintain balance in sitting in recliner     Standing balance-Leahy Scale: Poor Standing balance comment: fair to poor stability with pt needing min assist to prevent posterior LOB when coming to initial stand, but no LOB otherwise                            Communication Communication Communication: Impaired Factors Affecting Communication: Hearing impaired  Cognition Arousal: Alert Behavior During Therapy: WFL for tasks assessed/performed   PT - Cognitive impairments: No apparent impairments  Following commands: Intact      Cueing Cueing Techniques: Verbal cues  Exercises  Pt tolerated 2 bouts of static standing for 3 minutes each before reaching fatigue during pericare    General Comments        Pertinent Vitals/Pain Pain Assessment Pain Assessment: No/denies pain    Home Living                          Prior Function            PT Goals (current goals can now be found in the care plan section) Acute  Rehab PT Goals Patient Stated Goal: to be able to walk around independently at home PT Goal Formulation: With patient Time For Goal Achievement: 12/30/23 Potential to Achieve Goals: Fair Progress towards PT goals: Progressing toward goals    Frequency    Min 2X/week      PT Plan      Co-evaluation              AM-PAC PT 6 Clicks Mobility   Outcome Measure  Help needed turning from your back to your side while in a flat bed without using bedrails?: A Lot Help needed moving from lying on your back to sitting on the side of a flat bed without using bedrails?: A Lot Help needed moving to and from a bed to a chair (including a wheelchair)?: A Little Help needed standing up from a chair using your arms (e.g., wheelchair or bedside chair)?: A Little Help needed to walk in hospital room?: A Lot Help needed climbing 3-5 steps with a railing? : A Lot 6 Click Score: 14    End of Session Equipment Utilized During Treatment: Gait belt Activity Tolerance: Patient limited by fatigue Patient left: with chair alarm set;in chair;with call bell/phone within reach;with family/visitor present;with nursing/sitter in room Nurse Communication: Mobility status PT Visit Diagnosis: Unsteadiness on feet (R26.81);Difficulty in walking, not elsewhere classified (R26.2);Muscle weakness (generalized) (M62.81)     Time: 1610-9604 PT Time Calculation (min) (ACUTE ONLY): 36 min  Charges:                            Rhoda Centers, SPT 12/17/23, 4:56 PM

## 2023-12-18 ENCOUNTER — Other Ambulatory Visit: Payer: Self-pay

## 2023-12-18 DIAGNOSIS — R17 Unspecified jaundice: Secondary | ICD-10-CM | POA: Diagnosis not present

## 2023-12-18 DIAGNOSIS — K831 Obstruction of bile duct: Secondary | ICD-10-CM | POA: Diagnosis not present

## 2023-12-18 DIAGNOSIS — T383X1A Poisoning by insulin and oral hypoglycemic [antidiabetic] drugs, accidental (unintentional), initial encounter: Secondary | ICD-10-CM | POA: Diagnosis not present

## 2023-12-18 DIAGNOSIS — E039 Hypothyroidism, unspecified: Secondary | ICD-10-CM

## 2023-12-18 DIAGNOSIS — G9341 Metabolic encephalopathy: Secondary | ICD-10-CM | POA: Diagnosis not present

## 2023-12-18 LAB — GLUCOSE, CAPILLARY
Glucose-Capillary: 141 mg/dL — ABNORMAL HIGH (ref 70–99)
Glucose-Capillary: 148 mg/dL — ABNORMAL HIGH (ref 70–99)
Glucose-Capillary: 212 mg/dL — ABNORMAL HIGH (ref 70–99)
Glucose-Capillary: 278 mg/dL — ABNORMAL HIGH (ref 70–99)

## 2023-12-18 LAB — CBC
HCT: 30.1 % — ABNORMAL LOW (ref 36.0–46.0)
Hemoglobin: 9.9 g/dL — ABNORMAL LOW (ref 12.0–15.0)
MCH: 29.7 pg (ref 26.0–34.0)
MCHC: 32.9 g/dL (ref 30.0–36.0)
MCV: 90.4 fL (ref 80.0–100.0)
Platelets: 345 10*3/uL (ref 150–400)
RBC: 3.33 MIL/uL — ABNORMAL LOW (ref 3.87–5.11)
RDW: 18.6 % — ABNORMAL HIGH (ref 11.5–15.5)
WBC: 7.9 10*3/uL (ref 4.0–10.5)
nRBC: 0.4 % — ABNORMAL HIGH (ref 0.0–0.2)

## 2023-12-18 LAB — COMPREHENSIVE METABOLIC PANEL WITH GFR
ALT: 62 U/L — ABNORMAL HIGH (ref 0–44)
AST: 64 U/L — ABNORMAL HIGH (ref 15–41)
Albumin: 2.2 g/dL — ABNORMAL LOW (ref 3.5–5.0)
Alkaline Phosphatase: 576 U/L — ABNORMAL HIGH (ref 38–126)
Anion gap: 9 (ref 5–15)
BUN: 14 mg/dL (ref 8–23)
CO2: 20 mmol/L — ABNORMAL LOW (ref 22–32)
Calcium: 8.6 mg/dL — ABNORMAL LOW (ref 8.9–10.3)
Chloride: 103 mmol/L (ref 98–111)
Creatinine, Ser: 0.74 mg/dL (ref 0.44–1.00)
GFR, Estimated: >60 mL/min (ref >60)
Glucose, Bld: 135 mg/dL — ABNORMAL HIGH (ref 70–99)
Potassium: 3.8 mmol/L (ref 3.5–5.1)
Sodium: 132 mmol/L — ABNORMAL LOW (ref 135–145)
Total Bilirubin: 9.6 mg/dL — ABNORMAL HIGH (ref 0.0–1.2)
Total Protein: 5.7 g/dL — ABNORMAL LOW (ref 6.5–8.1)

## 2023-12-18 MED ORDER — METOPROLOL SUCCINATE ER 50 MG PO TB24
50.0000 mg | ORAL_TABLET | Freq: Every day | ORAL | Status: DC
  Administered 2023-12-18: 50 mg via ORAL
  Filled 2023-12-18: qty 1

## 2023-12-18 MED ORDER — LEVOTHYROXINE SODIUM 88 MCG PO TABS
88.0000 ug | ORAL_TABLET | Freq: Every day | ORAL | Status: DC
  Administered 2023-12-18: 88 ug via ORAL
  Filled 2023-12-18: qty 1

## 2023-12-18 MED ORDER — HYDRALAZINE HCL 50 MG PO TABS
50.0000 mg | ORAL_TABLET | Freq: Two times a day (BID) | ORAL | Status: DC
  Administered 2023-12-18: 50 mg via ORAL
  Filled 2023-12-18: qty 1

## 2023-12-18 MED ORDER — AMLODIPINE BESYLATE 5 MG PO TABS
5.0000 mg | ORAL_TABLET | Freq: Every day | ORAL | Status: DC
  Administered 2023-12-18: 5 mg via ORAL
  Filled 2023-12-18: qty 1

## 2023-12-18 MED ORDER — FUROSEMIDE 20 MG PO TABS
40.0000 mg | ORAL_TABLET | Freq: Every day | ORAL | 3 refills | Status: DC | PRN
Start: 2023-12-18 — End: 2024-03-08
  Filled 2023-12-18: qty 90, 45d supply, fill #0

## 2023-12-18 NOTE — Progress Notes (Signed)
 Pt alert and oriented to self. Discharge instructions reviewed with pt and daughter. Daughter verbalized understanding. Daughter verbalized understanding of need to schedule/attend follow up appointments. Prescription delivered to bedside. Dr. Ariel Begun stated to hold Metformin  and to discuss this medication with PCP. Daughter verbalized understanding. IV removed. IV site WNL. Pt left unit in wheelchair with nurse and tech. Nurse and tech assisted pt into car. Pt left hospital with daughter.

## 2023-12-18 NOTE — Discharge Summary (Signed)
 Physician Discharge Summary   Patient: Barbara Butler MRN: [id number] DOB: 05-22-1930  Admit date:     12/13/2023  Discharge date: 12/18/23  Discharge Physician: Luna Salinas   PCP: Antonio Baumgarten, MD   Recommendations at discharge:  Please obtain CBC and CMP on follow-up Patient need a repeat ERCP in 64-month Follow-up with gastroenterology We stopped home glipizide for concern of hypoglycemia secondary to sulfonylurea. 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 female with medical history significant for atrial fibrillation on Eliquis , hypertension, type 2 diabetes mellitus, CVA, hypothyroidism, hospitalized in March for diverticular bleed requiring 2 units PRBCs and urgent embolization who is being admitted with hypoglycemia presenting with 2 episodes of decreased responsiveness.  On both occasions her blood sugar was low.  During the episode just prior to arrival she developed gurgling respirations.  EMS was called and they found her blood sugar in the 50s and started her on D10.  History provided by daughter-in-law and caregiver at bedside.  She is both on glimepiride and metformin .  Of note, patient recently had an MRCP 12/03/2023 indicated for painless jaundice noted by her physical therapist.  They were referred to GI based on the results, but according to  daughter-in-law, patient was not interested in following up with GI, saying  I am tired and was not interested in following up.  She states she decided to try to encourage her at a later time. ED course and data review: Mildly tachycardic to 101 with otherwise normal vitals..  By arrival blood sugar 92.  Other labs notable for sodium 127, potassium 2.8, bicarb 18, hemoglobin 10 .EKG with A-fib at 84. Patient was given an amp of D50 and oral potassium and started on a D10 infusion Hospitalist consulted for admission.    Following admission to the hospitalist service, hepatic function panel resulted showing bilirubin of 20, up from 0.5 along with other abnormalities.  RUQ ultrasound with concern of biliary dilatation so patient underwent MRCP which showed unusually severe intra and extrahepatic biliary ductal dilatation, ...no obvious obstructing calculus or mass.  Later on she underwent ERCP and a stent was placed in her common bile duct due to a distal CBD stricture.  Surgical pathology with no specific pathologic diagnosis and negative for any malignancy.  Her hepatic function started improving and T. bili was at 9 with improving liver enzymes on the day of discharge.  Patient was able to eat without any difficulty.  Gastroenterology is recommending repeat ERCP for stent removal or replacing with a permanent stent based on their findings in 24-month.  We initially held her Eliquis  for procedure and it was later resumed.  Patient has an history of GI diverticular bleed requiring emergency embolization in March 2025.  Hemoglobin currently stable and no concern of active bleeding.  Patient was initially found to be hypoglycemic requiring D10 infusion.  It was likely due to sulfonylurea use her  home glipizide was discontinued.  She will follow-up with primary care provider for better assistance with her diabetes.  Patient will continue on current medications and need to have a close follow-up with her  providers for further management.   Consultants: Gastroenterology Procedures performed: ERCP with biliary stent placement Disposition: Home health Diet recommendation:  Discharge Diet Orders (From admission, onward)     Start     Ordered   12/18/23 0000  Diet - low sodium heart healthy        12/18/23 1028           Cardiac and Carb modified diet DISCHARGE MEDICATION: Allergies as of 12/18/2023   No Known Allergies      Medication List     TAKE these medications    amLODipine  5 MG tablet Commonly known as: NORVASC  Take 1 tablet (5 mg total) by mouth daily.   apixaban  2.5 MG Tabs tablet Commonly known as: Eliquis  Take 1 tablet (2.5 mg total) by mouth 2 (two) times daily.   CENTRUM SILVER 50+WOMEN PO Take 1 tablet by mouth daily.   furosemide  20 MG tablet Commonly known as: LASIX  Take 2 tablets (40 mg total) by mouth daily as needed for fluid or edema. What changed:  when to take this reasons to take this   hydrALAZINE  50 MG tablet Commonly known as: APRESOLINE  Take 1 tablet (50 mg total) by mouth 2 (two) times daily.   levothyroxine  88 MCG tablet Commonly known as: SYNTHROID  Take 88 mcg by mouth daily.   metFORMIN  500 MG tablet Commonly known as: GLUCOPHAGE  Take 1 tablet (500 mg total) by mouth 2 (two) times daily with a meal.   metoprolol  succinate 50 MG 24 hr tablet Commonly known as: TOPROL -XL Take 1 tablet (50 mg total) by mouth daily. Take with or immediately following a meal.   olmesartan 40 MG tablet Commonly known as: BENICAR Take 40 mg by mouth daily.   polyethylene glycol 17 g packet Commonly known as: MIRALAX  / GLYCOLAX  Take 17 g by mouth daily as needed for mild constipation.               Discharge Care Instructions  (From admission, onward)           Start     Ordered   12/18/23 0000  No dressing needed        12/18/23 1028            Follow-up Information     [facility], [facility]  Follow  up.   Why: They will follow up with you for your home health needs. [address] [place] [address] [phone]                Discharge Exam: Barbara Butler Weights   12/15/23 0841  Weight: 52.8 kg   General.  Frail elderly lady, in no acute distress. Pulmonary.  Lungs clear bilaterally, normal respiratory effort. CV.  Regular rate and rhythm, no JVD, rub or murmur. Abdomen.  Soft, nontender, nondistended, BS positive. CNS.  Alert and oriented .  No focal neurologic deficit. Extremities.  No edema, no cyanosis, pulses intact and symmetrical.   Condition at discharge: stable  The results of significant diagnostics from this hospitalization (including imaging, microbiology, ancillary and laboratory) are listed below for reference.   Imaging Studies: DG C-Arm 1-60 Min-No Report Result Date: 12/15/2023 Fluoroscopy was utilized by the requesting physician.  No radiographic interpretation.   MR ABDOMEN MRCP W WO  CONTAST Result Date: 12/04/2023 CLINICAL DATA:  Obstructive jaundice EXAM: MRI ABDOMEN WITHOUT AND WITH CONTRAST (INCLUDING MRCP) TECHNIQUE: Multiplanar multisequence MR imaging of the abdomen was performed both before and after the administration of intravenous contrast. Heavily T2-weighted images of the biliary and pancreatic ducts were obtained, and three-dimensional MRCP images were rendered by post processing. CONTRAST:  5mL GADAVIST  GADOBUTROL  1 MMOL/ML IV SOLN COMPARISON:  CT abdomen pelvis, 09/19/2023 FINDINGS: Lower chest: No acute abnormality.  Cardiomegaly. Hepatobiliary: No focal liver abnormality is seen. Large gallstone in the nondistended gallbladder. Unusually severe intra and extrahepatic biliary ductal dilatation, similar to prior examination, the common hepatic duct measuring at least 2.9 cm in caliber (series 3, image 18). This remains severely dilated to the level of the ampulla, however there is no obvious obstructing calculus or  mass. Pancreas: Severe pancreatic parenchymal atrophy with dilatation of the pancreatic duct measuring up to 1.0 cm in caliber, the duct remaining severely dilated to the ampulla, again without obvious obstructing calculus or mass (series 4, image 24). Spleen: Normal in size without significant abnormality. Adrenals/Urinary Tract: Adrenal glands are unremarkable. Simple, benign bilateral renal cortical cysts, for which no further follow-up or characterization is required. Kidneys are otherwise normal, without obvious renal calculi, solid lesion, or hydronephrosis. Stomach/Bowel: Stomach is within normal limits. No evidence of bowel wall thickening, distention, or inflammatory changes. Pancolonic diverticulosis. Vascular/Lymphatic: Aortic atherosclerosis. No enlarged abdominal lymph nodes. Other: No abdominal wall hernia or abnormality. No ascites. Musculoskeletal: No acute or significant osseous findings. IMPRESSION: 1. Unusually severe intra and extrahepatic biliary ductal dilatation, similar to prior examination, the common hepatic duct measuring at least 2.9 cm in caliber. This remains severely dilated to the level of the ampulla, however there is no obvious obstructing calculus or mass. 2. Severe pancreatic parenchymal atrophy with dilatation of the pancreatic duct measuring up to 1.0 cm in caliber, the duct remaining severely dilated to the ampulla, again without obvious obstructing calculus or mass. 3. Constellation of findings is of uncertain significance although presumed occult mass or stricture of the ampulla. Consider ERCP to further evaluate. 4. Cholelithiasis without evidence of acute cholecystitis. 5. Pancolonic diverticulosis. 6. Cardiomegaly. Aortic Atherosclerosis (ICD10-I70.0). Electronically Signed   By: Fredricka Jenny M.D.   On: 12/04/2023 06:47   MR 3D Recon At Scanner Result Date: 12/04/2023 CLINICAL DATA:  Obstructive jaundice EXAM: MRI ABDOMEN WITHOUT AND WITH CONTRAST (INCLUDING MRCP)  TECHNIQUE: Multiplanar multisequence MR imaging of the abdomen was performed both before and after the administration of intravenous contrast. Heavily T2-weighted images of the biliary and pancreatic ducts were obtained, and three-dimensional MRCP images were rendered by post processing. CONTRAST:  5mL GADAVIST  GADOBUTROL  1 MMOL/ML IV SOLN COMPARISON:  CT abdomen pelvis, 09/19/2023 FINDINGS: Lower chest: No acute abnormality.  Cardiomegaly. Hepatobiliary: No focal liver abnormality is seen. Large gallstone in the nondistended gallbladder. Unusually severe intra and extrahepatic biliary ductal dilatation, similar to prior examination, the common hepatic duct measuring at least 2.9 cm in caliber (series 3, image 18). This remains severely dilated to the level of the ampulla, however there is no obvious obstructing calculus or mass. Pancreas: Severe pancreatic parenchymal atrophy with dilatation of the pancreatic duct measuring up to 1.0 cm in caliber, the duct remaining severely dilated to the ampulla, again without obvious obstructing calculus or mass (series 4, image 24). Spleen: Normal in size without significant abnormality. Adrenals/Urinary Tract: Adrenal glands are unremarkable. Simple, benign bilateral renal cortical cysts, for which no further follow-up or characterization is required. Kidneys are  otherwise normal, without obvious renal calculi, solid lesion, or hydronephrosis. Stomach/Bowel: Stomach is within normal limits. No evidence of bowel wall thickening, distention, or inflammatory changes. Pancolonic diverticulosis. Vascular/Lymphatic: Aortic atherosclerosis. No enlarged abdominal lymph nodes. Other: No abdominal wall hernia or abnormality. No ascites. Musculoskeletal: No acute or significant osseous findings. IMPRESSION: 1. Unusually severe intra and extrahepatic biliary ductal dilatation, similar to prior examination, the common hepatic duct measuring at least 2.9 cm in caliber. This remains  severely dilated to the level of the ampulla, however there is no obvious obstructing calculus or mass. 2. Severe pancreatic parenchymal atrophy with dilatation of the pancreatic duct measuring up to 1.0 cm in caliber, the duct remaining severely dilated to the ampulla, again without obvious obstructing calculus or mass. 3. Constellation of findings is of uncertain significance although presumed occult mass or stricture of the ampulla. Consider ERCP to further evaluate. 4. Cholelithiasis without evidence of acute cholecystitis. 5. Pancolonic diverticulosis. 6. Cardiomegaly. Aortic Atherosclerosis (ICD10-I70.0). Electronically Signed   By: Fredricka Jenny M.D.   On: 12/04/2023 06:47    Microbiology: Results for orders placed or performed during the hospital encounter of 09/15/23  MRSA Next Gen by PCR, Nasal     Status: None   Collection Time: 09/19/23 10:24 PM   Specimen: Nasal Mucosa; Nasal Swab  Result Value Ref Range Status   MRSA by PCR Next Gen NOT DETECTED NOT DETECTED Final    Comment: (NOTE) The GeneXpert MRSA Assay (FDA approved for NASAL specimens only), is one component of a comprehensive MRSA colonization surveillance program. It is not intended to diagnose MRSA infection nor to guide or monitor treatment for MRSA infections. Test performance is not FDA approved in patients less than 40 years old. Performed at [facility], [address] [place], [place] [address]     Labs: CBC: Recent Labs  Lab 12/13/23 2156 12/14/23 0802 12/18/23 0458  WBC 7.7 7.7 7.9  NEUTROABS 6.5  --   --   HGB 10.4* 11.3* 9.9*  HCT 30.2* 33.8* 30.1*  MCV 86.0 89.7 90.4  PLT 337 255 345   Basic Metabolic Panel: Recent Labs  Lab 12/13/23 2357 12/14/23 0802 12/15/23 0413 12/15/23 0414 12/16/23 0617 12/17/23 0409 12/18/23 0458  NA  --  126* 125*  --  131* 131* 132*  K  --  3.1* 4.4  --  4.5 4.6 3.8  CL  --  95* 99  --  105 103 103  CO2  --  18* 17*  --  19* 20* 20*  GLUCOSE  --   120* 162*  --  91 131* 135*  BUN  --  12 15  --  17 16 14   CREATININE  --  0.43* 0.71  --  0.77 0.76 0.74  CALCIUM   --  9.0 8.8*  --  8.7* 8.7* 8.6*  MG 2.0  --   --  1.9 2.0  --   --   PHOS  --   --  2.1*  --  3.9  --   --    Liver Function Tests: Recent Labs  Lab 12/13/23 2357 12/14/23 0802 12/15/23 0413 12/16/23 0617 12/17/23 0408 12/18/23 0458  AST 161* 187*  --  110* 90* 64*  ALT 87* 109*  --  82* 75* 62*  ALKPHOS 693* 854*  --  585* 638* 576*  BILITOT 20.4* 23.2*  --  13.7* 11.1* 9.6*  PROT 5.6* 6.8  --  5.3* 5.5* 5.7*  ALBUMIN 2.2* 2.7* 2.5* 2.1*  2.1* 2.2* 2.2*   CBG: Recent Labs  Lab 12/17/23 1600 12/17/23 2154 12/18/23 0644 12/18/23 0718 12/18/23 1011  GLUCAP 198* 160* 141* 148* 212*    Discharge time spent: greater than 30 minutes.  This record has been created using Conservation officer, historic buildings. Errors have been sought and corrected,but may not always be located. Such creation errors do not reflect on the standard of care.   Signed: Luna Salinas, MD [facility] 12/18/2023

## 2023-12-18 NOTE — TOC Transition Note (Signed)
 Transition of Care [facility]) - Discharge Note   Patient Details  Name: Barbara Butler MRN: [id number] Date of Birth: April 17, 1930  Transition of Care [facility] CM/SW Contact:  Odilia Bennett, LCSW Phone Number: 12/18/2023, 10:34 AM   Clinical Narrative:  Patient has orders to discharge home today. Left message for [facility] liaison to notify. No further concerns. CSW signing off.   Final next level of care: Home w Home Health Services Barriers to Discharge: Barriers Resolved   Patient Goals and CMS Choice     Choice offered to / list presented to : Adult Children      Discharge Placement                Patient to be transferred to facility by: [name]   Patient and family notified of of transfer: 12/18/23  Discharge Plan and Services Additional resources added to the After Visit Summary for       Post Acute Care Choice: Resumption of Svcs/PTA Provider                    HH Arranged: RN, PT [facility] Agency: [facility] Date HH Agency Contacted: 12/18/23   Representative spoke with at [facility] Agency: Shaun  Social Drivers of Health (SDOH) Interventions SDOH Screenings   Food Insecurity: No Food Insecurity (12/14/2023)  Housing: Low Risk  (12/14/2023)  Transportation Needs: No Transportation Needs (12/14/2023)  Utilities: Not At Risk (12/14/2023)  Financial Resource Strain: Low Risk  (09/05/2023)   Received from [facility]  Social Connections: Moderately Isolated (12/14/2023)  Tobacco Use: Medium Risk (12/15/2023)     Readmission Risk Interventions    02/07/2023    3:03 PM  Readmission Risk Prevention Plan  Transportation Screening Complete  PCP or Specialist Appt within 5-7 Days Complete  Home Care Screening Complete  Medication Review (RN CM) Complete

## 2023-12-18 NOTE — Consult Note (Signed)
 PHARMACY CONSULT NOTE  Pharmacy Consult for Electrolyte Monitoring and Replacement   Recent Labs: Potassium (mmol/L)  Date Value  12/18/2023 3.8   Magnesium  (mg/dL)  Date Value  16/04/9603 2.0   Calcium  (mg/dL)  Date Value  54/03/8118 8.6 (L)   Albumin (g/dL)  Date Value  14/78/2956 2.2 (L)   Phosphorus (mg/dL)  Date Value  21/30/8657 3.9   Sodium (mmol/L)  Date Value  12/18/2023 132 (L)   Assessment: 89 y.o. female with medical history significant for atrial fibrillation on Eliquis , hypertension, type 2 diabetes mellitus, CVA, hypothyroidism, hospitalized in March for diverticular bleed requiring 2 units PRBCs and urgent embolization who is being admitted with hypoglycemia presenting with 2 episodes of decreased responsiveness. PTA meds: lasix , olmesartan   Goal of Therapy:  WNL  Plan:  Electrolytes stable and no replacement indicated Pharmacy to sign off on consult. Will continue to monitor peripherally  Page Boast 12/18/2023 7:24 AM

## 2023-12-22 ENCOUNTER — Telehealth: Payer: Self-pay

## 2023-12-22 ENCOUNTER — Other Ambulatory Visit: Payer: Self-pay

## 2023-12-22 DIAGNOSIS — K831 Obstruction of bile duct: Secondary | ICD-10-CM

## 2023-12-22 NOTE — Telephone Encounter (Signed)
 Scheduled for 03/26/2024... Pt has upcoming appt with PCP and will have possible med changed for DM... Will f/u after appt to confirm changes before completing instructions

## 2023-12-22 NOTE — Telephone Encounter (Signed)
 error

## 2023-12-22 NOTE — Telephone Encounter (Signed)
 Procedure/blood thinner clearance faxed to Dr Alvenia Aus

## 2023-12-24 ENCOUNTER — Telehealth (HOSPITAL_BASED_OUTPATIENT_CLINIC_OR_DEPARTMENT_OTHER): Payer: Self-pay | Admitting: *Deleted

## 2023-12-24 NOTE — Telephone Encounter (Signed)
   Pre-operative Risk Assessment    Patient Name: Barbara Butler  DOB: 27-May-1930 MRN: [id number]   Date of last office visit: 10/03/2023 Date of next office visit: 01/05/2024 Added pre op to upcoming appointment notes.  Request for Surgical Clearance    Procedure:  ERCP  Date of Surgery:  Clearance 03/22/24                                 Surgeon:  Dr. Marnee Sink Surgeon's Group or Practice Name:  [facility] Phone number:  [phone] Fax number:  [phone]   Type of Clearance Requested:   - Medical  - Pharmacy:  Hold Apixaban  (Eliquis ) Not Indicated   Type of Anesthesia:  General    Additional requests/questions:    Signed, Lauris Port   12/24/2023, 2:44 PM

## 2023-12-25 DIAGNOSIS — E119 Type 2 diabetes mellitus without complications: Secondary | ICD-10-CM | POA: Insufficient documentation

## 2023-12-25 NOTE — Telephone Encounter (Addendum)
 Patient with diagnosis of A Fib on Eliquis  for anticoagulation.  Patient has not yet seen a [facility] cardiologist.  Procedure: ERCP Date of procedure: 03/22/24   CHA2DS2-VASc Score = 7  This indicates a 11.2% annual risk of stroke. The patient's score is based upon: CHF History: 0 HTN History: 1 Diabetes History: 1 Stroke History: 2 Vascular Disease History: 0 Age Score: 2 Gender Score: 1   CrCl 40 ml/min Platelet count 345K    **This guidance is not considered finalized until pre-operative APP has relayed final recommendations.**

## 2023-12-26 NOTE — Telephone Encounter (Signed)
   Name: Barbara Butler  DOB: 1929-11-21  MRN: [id number]  Primary Cardiologist: Antionette Kirks, MD  Chart reviewed as part of pre-operative protocol coverage. The patient has an upcoming visit scheduled with [name] on 01/05/2024 at which time clearance can be addressed in case there are any issues that would impact surgical recommendations.  ERCP is not scheduled until 03/22/2024 as below. I added preop FYI to appointment note so that provider is aware to address at time of outpatient visit.  Per office protocol the cardiology provider should forward their finalized clearance decision and recommendations regarding antiplatelet therapy to the requesting party below.    This message will also be routed to pharmacy pool for input on holding Eliquis  Patient with diagnosis of A Fib on Eliquis  for anticoagulation.  Patient has not yet seen a [facility] cardiologist.   Procedure: ERCP Date of procedure: 03/22/24     CHA2DS2-VASc Score = 7  This indicates a 11.2% annual risk of stroke. The patient's score is based upon: CHF History: 0 HTN History: 1 Diabetes History: 1 Stroke History: 2 Vascular Disease History: 0 Age Score: 2 Gender Score: 1   CrCl 40 ml/min Platelet count 345K    Please call with any questions.  Friddie Jetty, NP  12/26/2023, 7:45 AM

## 2024-01-05 ENCOUNTER — Ambulatory Visit: Admitting: Medical

## 2024-01-15 ENCOUNTER — Other Ambulatory Visit: Payer: Self-pay | Admitting: Cardiovascular Disease

## 2024-01-15 DIAGNOSIS — I4891 Unspecified atrial fibrillation: Secondary | ICD-10-CM

## 2024-01-15 NOTE — Telephone Encounter (Signed)
 Prescription refill request for Eliquis  received. Indication: Afib  Last office visit:10/03/23 Dene)  Scr: 0.8 (12/29/23)  Age: 89 Weight: 52.8kg  Appropriate dose. Refill sent.

## 2024-02-03 ENCOUNTER — Encounter: Payer: Self-pay | Admitting: Medical

## 2024-02-03 ENCOUNTER — Ambulatory Visit: Attending: Medical | Admitting: Medical

## 2024-02-03 ENCOUNTER — Ambulatory Visit: Admitting: Medical

## 2024-02-03 VITALS — BP 104/74 | HR 92 | Resp 17 | Ht 62.0 in | Wt 115.0 lb

## 2024-02-03 DIAGNOSIS — Z8673 Personal history of transient ischemic attack (TIA), and cerebral infarction without residual deficits: Secondary | ICD-10-CM

## 2024-02-03 DIAGNOSIS — K922 Gastrointestinal hemorrhage, unspecified: Secondary | ICD-10-CM | POA: Diagnosis not present

## 2024-02-03 DIAGNOSIS — I4821 Permanent atrial fibrillation: Secondary | ICD-10-CM | POA: Diagnosis not present

## 2024-02-03 DIAGNOSIS — I10 Essential (primary) hypertension: Secondary | ICD-10-CM

## 2024-02-03 DIAGNOSIS — I5032 Chronic diastolic (congestive) heart failure: Secondary | ICD-10-CM | POA: Diagnosis not present

## 2024-02-03 NOTE — Progress Notes (Deleted)
  Cardiology Office Note   Date:  02/03/2024  ID:  Barbara Butler, DOB 1930-01-19, MRN [id number] PCP: Barbara Manna, MD  [facility] Providers Cardiologist:  Deatrice Cage, MD { Click to update primary MD,subspecialty MD or APP then REFRESH:1}    History of Present Illness Barbara Butler is a 89 y.o. female with a hx of hypertension, hyperlipidemia, diabetes type 2, hypothyroidism, anemia, Afib on Eliquis ,  and stroke who is being seen for hospital follow-up.   Patient presented to [facility] ED 01/26/2023 for altered mental status.  In the ER she was noted to be in A-fib/flutter with heart rate of 97 bpm.  Imaging of the head showed acute to subacute ischemic infarcts in the occipital lobes and posterior splenium.  CHA2DS2-VASc of 8.  She was started on Eliquis  5 mg twice daily.  Echo showed LVEF 70 to 75%, moderate LVH, severely dilated left atrium.  She was started on Toprol  25 mg daily.  Plan was to continue rate control given severely dilated left atrium, advanced age and asymptomatic. She was discharged to rehab. At rehab she sustained a fall and was went to the ER.    The patient was admitted 02/05/23-02/11/23 for a fall found to have a closed right hip fracture and right humerus fracture. She underwent right hip repair and was eventually discharged back to the rehab facility.    Patient was seen in August 2024 reporting good progress at the rehab facility.  Plan was to pursue cardioversion, however patient wanted to pursue medical management.   Patient was admitted in October 2024 with severe sepsis secondary to E. coli UTI, mild rectal bleeding from hemorrhoids, hypokalemia, A-fib with controlled rates.  Patient was discharged to SNF.   Patient was admitted in March 2025 with GI bleed, diverticular bleed, acute blood loss anemia.  Eliquis  was held.  EGD and sigmoidoscopy showed no active bleeding.  Nuclear scan positive for active GI bleeding in the left colon.  Vascular surgery  urgently embolized on 09/19/2023.  No more bleeding since that time.  Patient was last seen 10/03/2023 was overall stable from a cardiac perspective she was back on Eliquis  2.5 mg twice daily.  Today,   ROS: ***  Studies Reviewed      *** Risk Assessment/Calculations {Does this patient have ATRIAL FIBRILLATION?:[phone]} No BP recorded.  {Refresh Note OR Click here to enter BP  :1}***       Physical Exam VS:  There were no vitals taken for this visit.       Wt Readings from Last 3 Encounters:  12/15/23 116 lb 6.5 oz (52.8 kg)  10/03/23 126 lb (57.2 kg)  09/22/23 121 lb 14.6 oz (55.3 kg)    GEN: Well nourished, well developed in no acute distress NECK: No JVD; No carotid bruits CARDIAC: ***RRR, no murmurs, rubs, gallops RESPIRATORY:  Clear to auscultation without rales, wheezing or rhonchi  ABDOMEN: Soft, non-tender, non-distended EXTREMITIES:  No edema; No deformity   ASSESSMENT AND PLAN ***    {Are you ordering a CV Procedure (e.g. stress test, cath, DCCV, TEE, etc)?   Press F2        :[id number]}  Dispo: ***  Signed, Lamyia Cdebaca VEAR Fishman, PA-C

## 2024-02-03 NOTE — Patient Instructions (Addendum)
 Medication Instructions:  Your physician recommends the following medication changes.  STOP TAKING: Amlodipine     *If you need a refill on your cardiac medications before your next appointment, please call your pharmacy*  Lab Work: No labs ordered today    Testing/Procedures: No test ordered today   Follow-Up: At [facility], you and your health needs are our priority.  As part of our continuing mission to provide you with exceptional heart care, our providers are all part of one team.  This team includes your primary Cardiologist (physician) and Advanced Practice Providers or APPs (Physician Assistants and Nurse Practitioners) who all work together to provide you with the care you need, when you need it.  Your next appointment:   4 month(s)  Provider:   Deatrice Cage, MD or Cadence Franchester, PA-C

## 2024-02-03 NOTE — Progress Notes (Signed)
 Cardiology Office Note   Date:  02/03/2024  ID:  [name], DOB 1929/09/23, MRN [id number] PCP: Sadie Manna, MD  [facility] Providers Cardiologist:  Deatrice Cage, MD   History of Present Illness Barbara Butler is a 89 y.o. female with a hx of hypertension, hyperlipidemia, diabetes type 2, hypothyroidism, anemia, Afib on Eliquis  and stroke who is being seen for 3 month follow-up.   Patient presented to [facility] ED 01/26/2023 for altered mental status.  In the ER she was noted to be in A-fib/flutter with heart rate of 97 bpm.  Imaging of the head showed acute to subacute ischemic infarcts in the occipital lobes and posterior splenium.  CHA2DS2-VASc of 8.  She was started on Eliquis  5 mg twice daily.  Echo showed LVEF 70 to 75%, moderate LVH, severely dilated left atrium.  She was started on Toprol  25 mg daily.  Plan was to continue rate control given severely dilated left atrium, advanced age and asymptomatic. She was discharged to rehab. At rehab she sustained a fall and was went to the ER.    The patient was admitted 02/05/23-02/11/23 for a fall found to have a closed right hip fracture and right humerus fracture. She underwent right hip repair and was eventually discharged back to the rehab facility.    Patient was seen in August 2024 reporting good progress at the rehab facility.  Plan was to pursue cardioversion, however patient wanted to pursue medical management.   Patient was admitted in March 2025 with GI bleed, diverticular bleed, acute blood loss anemia.  Eliquis  was held.  EGD and sigmoidoscopy showed no active bleeding.  Nuclear scan positive for active GI bleeding in the left colon.  Vascular surgery urgently embolized on 09/19/2023.  No more bleeding since that time.   Patient was last seen 10/03/2023 was overall stable from a cardiac perspective she was back on Eliquis  2.5 mg twice daily.  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 Plan for stent  removal or replacing with a permanent stent.   Today, the patient feels tired and sluggies. She was very active the day before, which is likely why energy is low today. She denies chest pain or SOB. BP is soft today. PCP stopped hydralazine  and Metformin . She has minimal lower leg edema. She takes lasix  as needed.    Studies Reviewed EKG Interpretation Date/Time:  Tuesday February 03 2024 13:41:06 EDT Ventricular Rate:  93 PR Interval:    QRS Duration:  114 QT Interval:  384 QTC Calculation: 477 R Axis:   -22  Text Interpretation: Atrial fibrillation Right bundle branch block Anteroseptal infarct , age undetermined When compared with ECG of 14-Dec-2023 00:42, PREVIOUS ECG IS PRESENT Confirmed by Franchester, Angellynn Kimberlin ([id number]) on 02/03/2024 2:05:27 PM    Echo 01/2023 1. Left ventricular ejection fraction, by estimation, is 70 to 75%. The  left ventricle has hyperdynamic function. The left ventricle has no  regional wall motion abnormalities. There is moderate asymmetric left  ventricular hypertrophy of the basal-septal   segment. Left ventricular diastolic parameters are indeterminate.   2. Right ventricular systolic function is normal. The right ventricular  size is normal. There is normal pulmonary artery systolic pressure.   3. Left atrial size was severely dilated.   4. Right atrial size was moderately dilated.   5. The mitral valve is degenerative. Mild to moderate mitral valve  regurgitation. No evidence of mitral stenosis. Moderate mitral annular  calcification.   6. Tricuspid valve  regurgitation is moderate.   7. The aortic valve is calcified. Aortic valve regurgitation is mild.  Aortic valve sclerosis/calcification is present, without any evidence of  aortic stenosis.   8. The inferior vena cava is normal in size with greater than 50%  respiratory variability, suggesting right atrial pressure of 3 mmHg.       Physical Exam VS:  BP 104/74 (BP Location: Left Arm, Patient Position:  Sitting, Cuff Size: Normal)   Pulse 92   Resp 17   Ht 5' 2 (1.575 m)   Wt 115 lb (52.2 kg)   SpO2 100%   BMI 21.03 kg/m        Wt Readings from Last 3 Encounters:  02/03/24 115 lb (52.2 kg)  12/15/23 116 lb 6.5 oz (52.8 kg)  10/03/23 126 lb (57.2 kg)    GEN: Well nourished, well developed in no acute distress NECK: No JVD; No carotid bruits CARDIAC: Irregularly irregular, no murmurs, rubs, gallops RESPIRATORY:  Clear to auscultation without rales, wheezing or rhonchi  ABDOMEN: Soft, non-tender, non-distended EXTREMITIES:  1+ pedal edema; No deformity   ASSESSMENT AND PLAN  Permanent Afib  Diagnosed during admission for stroke in 01/2023.  Plan for rate control given age, dilated atrium, asymptomatic status and patient declined cardioversion.  Patient is taking Eliquis  2.5 mg twice daily.  Recent hemoglobin 11-12. Continue Toprol  50mg  daily for rate control.   Anemia H/o GIB/diverticular bleed Patient underwent urgent emobolization by vascular surgery 09/19/23. Patient denies bleeding issues. Most recent Hgb 11.1.  H/o stroke No residual deficits. She is not on cholesterol medication due to patient choice and age.   HFpEF Patient has mild lower leg edema on exam. She takes lasix  as needed for swelling. Echo 01/2023 showed LVEF 70-75%, no WMA, mod LVH, severely dilated LA, mild to mod MR, mod TR, mild AI. Continue Toprol  and Lasix  PRN.   HTN BP is soft. PCP Stopped hydralazine . I will stop amlodipine . Continue Toprol  50mg  daily and Olmesartan 40mg  daily.        Dispo: Follow-up in 4 months  Signed, Juliya Magill VEAR Fishman, PA-C

## 2024-02-10 NOTE — Telephone Encounter (Signed)
Clearance faxed x 2 

## 2024-02-23 ENCOUNTER — Telehealth: Payer: Self-pay | Admitting: Medical

## 2024-02-23 NOTE — Telephone Encounter (Signed)
 ADDENDUM TO CLEARANCE REQUEST. MED HOLD ELQUIS NEEDED.   SURGEON: Dr. Rogelia Copping

## 2024-02-23 NOTE — Telephone Encounter (Signed)
 Faxed x3

## 2024-02-23 NOTE — Telephone Encounter (Signed)
   Pre-operative Risk Assessment    Patient Name: Barbara Butler  DOB: [birth year] MRN: [id number]   Date of last office visit: unknown Date of next office visit: [id number]   Request for Surgical Clearance    Procedure:  ERCP  Date of Surgery:  Clearance 03/22/24                                Surgeon:  TBD Surgeon's Group or Practice Name:  [facility] Phone number:  [phone] Fax number:  [phone]   Type of Clearance Requested:   - Medical    Type of Anesthesia:  General    Additional requests/questions:    SignedBerwyn LELON Sprung   02/23/2024, 3:14 PM

## 2024-02-27 NOTE — Telephone Encounter (Signed)
 Pharmacy please advise on holding Apixaban  prior to ER scheduled for 03/22/2024. Last labs  (CBC and CMET 12/29/2023) . Thank you.

## 2024-02-27 NOTE — Telephone Encounter (Signed)
 Patient with diagnosis of afib on Eliquis  for anticoagulation.    Procedure: ERCP  Date of procedure: 03/22/24   CHA2DS2-VASc Score = 7   This indicates a 11.2% annual risk of stroke. The patient's score is based upon: CHF History: 0 HTN History: 1 Diabetes History: 1 Stroke History: 2 Vascular Disease History: 0 Age Score: 2 Gender Score: 1      Pt not on anticoagulation at time of stroke Hx of GI bleed  CrCl 36 ml/min Platelet count 413  Patient has not had an Afib/aflutter ablation within the last 3 months or DCCV within the last 30 days  Per office protocol, patient can hold Eliquis  for 2 days prior to procedure.    **This guidance is not considered finalized until pre-operative APP has relayed final recommendations.**

## 2024-02-27 NOTE — Telephone Encounter (Signed)
   Patient Name: Barbara Butler  DOB: 1929-07-27 MRN: [id number]  Primary Cardiologist: Deatrice Cage, MD  Clinical pharmacists have reviewed the patient's past medical history, labs, and current medications as part of preoperative protocol coverage. The following recommendations have been made:  Patient with diagnosis of afib on Eliquis  for anticoagulation.     Procedure: ERCP  Date of procedure: 03/22/24     CHA2DS2-VASc Score = 7   This indicates a 11.2% annual risk of stroke. The patient's score is based upon: CHF History: 0 HTN History: 1 Diabetes History: 1 Stroke History: 2 Vascular Disease History: 0 Age Score: 2 Gender Score: 1   Pt not on anticoagulation at time of stroke Hx of GI bleed   CrCl 36 ml/min Platelet count 413   Patient has not had an Afib/aflutter ablation within the last 3 months or DCCV within the last 30 days   Per office protocol, patient can hold Eliquis  for 2 days prior to procedure.     I will route this recommendation to the requesting party via Epic fax function and remove from pre-op pool.  Please call with questions.  Lamarr Satterfield, NP 02/27/2024, 4:36 PM

## 2024-03-06 ENCOUNTER — Inpatient Hospital Stay
Admission: EM | Admit: 2024-03-06 | Discharge: 2024-03-08 | DRG: 884 | Disposition: A | Discharge status: Hospice | Attending: Internal Medicine | Admitting: Internal Medicine

## 2024-03-06 ENCOUNTER — Emergency Department

## 2024-03-06 ENCOUNTER — Other Ambulatory Visit: Payer: Self-pay

## 2024-03-06 DIAGNOSIS — H9190 Unspecified hearing loss, unspecified ear: Secondary | ICD-10-CM | POA: Diagnosis present

## 2024-03-06 DIAGNOSIS — I10 Essential (primary) hypertension: Secondary | ICD-10-CM | POA: Diagnosis present

## 2024-03-06 DIAGNOSIS — L8990 Pressure ulcer of unspecified site, unspecified stage: Secondary | ICD-10-CM | POA: Insufficient documentation

## 2024-03-06 DIAGNOSIS — Z7901 Long term (current) use of anticoagulants: Secondary | ICD-10-CM

## 2024-03-06 DIAGNOSIS — Z79899 Other long term (current) drug therapy: Secondary | ICD-10-CM

## 2024-03-06 DIAGNOSIS — R531 Weakness: Secondary | ICD-10-CM | POA: Diagnosis not present

## 2024-03-06 DIAGNOSIS — E1121 Type 2 diabetes mellitus with diabetic nephropathy: Secondary | ICD-10-CM | POA: Diagnosis present

## 2024-03-06 DIAGNOSIS — E1140 Type 2 diabetes mellitus with diabetic neuropathy, unspecified: Secondary | ICD-10-CM | POA: Diagnosis present

## 2024-03-06 DIAGNOSIS — Z79890 Hormone replacement therapy (postmenopausal): Secondary | ICD-10-CM

## 2024-03-06 DIAGNOSIS — F0390 Unspecified dementia without behavioral disturbance: Secondary | ICD-10-CM | POA: Diagnosis not present

## 2024-03-06 DIAGNOSIS — I4891 Unspecified atrial fibrillation: Secondary | ICD-10-CM | POA: Diagnosis present

## 2024-03-06 DIAGNOSIS — R4182 Altered mental status, unspecified: Secondary | ICD-10-CM | POA: Diagnosis present

## 2024-03-06 DIAGNOSIS — R400 Somnolence: Secondary | ICD-10-CM

## 2024-03-06 DIAGNOSIS — Z7984 Long term (current) use of oral hypoglycemic drugs: Secondary | ICD-10-CM

## 2024-03-06 DIAGNOSIS — Z8673 Personal history of transient ischemic attack (TIA), and cerebral infarction without residual deficits: Secondary | ICD-10-CM

## 2024-03-06 DIAGNOSIS — L89152 Pressure ulcer of sacral region, stage 2: Secondary | ICD-10-CM | POA: Diagnosis present

## 2024-03-06 DIAGNOSIS — E039 Hypothyroidism, unspecified: Secondary | ICD-10-CM | POA: Diagnosis present

## 2024-03-06 DIAGNOSIS — E785 Hyperlipidemia, unspecified: Secondary | ICD-10-CM | POA: Diagnosis present

## 2024-03-06 DIAGNOSIS — Z515 Encounter for palliative care: Secondary | ICD-10-CM

## 2024-03-06 DIAGNOSIS — E1165 Type 2 diabetes mellitus with hyperglycemia: Secondary | ICD-10-CM | POA: Diagnosis present

## 2024-03-06 DIAGNOSIS — Z66 Do not resuscitate: Secondary | ICD-10-CM | POA: Diagnosis present

## 2024-03-06 DIAGNOSIS — Z87891 Personal history of nicotine dependence: Secondary | ICD-10-CM

## 2024-03-06 DIAGNOSIS — K769 Liver disease, unspecified: Secondary | ICD-10-CM | POA: Diagnosis present

## 2024-03-06 DIAGNOSIS — Z794 Long term (current) use of insulin: Secondary | ICD-10-CM

## 2024-03-06 LAB — COMPREHENSIVE METABOLIC PANEL WITH GFR
ALT: 67 U/L — ABNORMAL HIGH (ref 0–44)
AST: 214 U/L — ABNORMAL HIGH (ref 15–41)
Albumin: 3.4 g/dL — ABNORMAL LOW (ref 3.5–5.0)
Alkaline Phosphatase: 179 U/L — ABNORMAL HIGH (ref 38–126)
Anion gap: 12 (ref 5–15)
BUN: 19 mg/dL (ref 8–23)
CO2: 21 mmol/L — ABNORMAL LOW (ref 22–32)
Calcium: 9 mg/dL (ref 8.9–10.3)
Chloride: 100 mmol/L (ref 98–111)
Creatinine, Ser: 0.72 mg/dL (ref 0.44–1.00)
GFR, Estimated: >60 mL/min (ref >60)
Glucose, Bld: 248 mg/dL — ABNORMAL HIGH (ref 70–99)
Potassium: 3.8 mmol/L (ref 3.5–5.1)
Sodium: 133 mmol/L — ABNORMAL LOW (ref 135–145)
Total Bilirubin: 3.7 mg/dL — ABNORMAL HIGH (ref 0.0–1.2)
Total Protein: 7.5 g/dL (ref 6.5–8.1)

## 2024-03-06 LAB — CBC WITH DIFFERENTIAL/PLATELET
Abs Immature Granulocytes: 0.11 K/uL — ABNORMAL HIGH (ref 0.00–0.07)
Basophils Absolute: 0 K/uL (ref 0.0–0.1)
Basophils Relative: 0 %
Eosinophils Absolute: 0 K/uL (ref 0.0–0.5)
Eosinophils Relative: 0 %
HCT: 35 % — ABNORMAL LOW (ref 36.0–46.0)
Hemoglobin: 11.4 g/dL — ABNORMAL LOW (ref 12.0–15.0)
Immature Granulocytes: 1 %
Lymphocytes Relative: 6 %
Lymphs Abs: 0.7 K/uL (ref 0.7–4.0)
MCH: 26.3 pg (ref 26.0–34.0)
MCHC: 32.6 g/dL (ref 30.0–36.0)
MCV: 80.6 fL (ref 80.0–100.0)
Monocytes Absolute: 0.4 K/uL (ref 0.1–1.0)
Monocytes Relative: 4 %
Neutro Abs: 10.1 K/uL — ABNORMAL HIGH (ref 1.7–7.7)
Neutrophils Relative %: 89 %
Platelets: 364 K/uL (ref 150–400)
RBC: 4.34 MIL/uL (ref 3.87–5.11)
RDW: 16.7 % — ABNORMAL HIGH (ref 11.5–15.5)
WBC: 11.4 K/uL — ABNORMAL HIGH (ref 4.0–10.5)
nRBC: 0 % (ref 0.0–0.2)

## 2024-03-06 LAB — TROPONIN I (HIGH SENSITIVITY)
Troponin I (High Sensitivity): 20 ng/L — ABNORMAL HIGH (ref <18)
Troponin I (High Sensitivity): 24 ng/L — ABNORMAL HIGH (ref <18)

## 2024-03-06 LAB — AMMONIA: Ammonia: 17 umol/L (ref 9–35)

## 2024-03-06 LAB — LACTIC ACID, PLASMA
Lactic Acid, Venous: 2.4 mmol/L (ref 0.5–1.9)
Lactic Acid, Venous: 2.9 mmol/L (ref 0.5–1.9)

## 2024-03-06 LAB — CBG MONITORING, ED: Glucose-Capillary: 255 mg/dL — ABNORMAL HIGH (ref 70–99)

## 2024-03-06 MED ORDER — ACETAMINOPHEN 325 MG PO TABS: 650.0000 mg | ORAL_TABLET | Freq: Four times a day (QID) | ORAL | Status: DC | PRN

## 2024-03-06 MED ORDER — POLYETHYLENE GLYCOL 3350 17 G PO PACK
17.0000 g | PACK | Freq: Every day | ORAL | Status: DC
  Administered 2024-03-07 – 2024-03-08 (×2): 17 g via ORAL
  Filled 2024-03-06 (×2): qty 1

## 2024-03-06 MED ORDER — ENOXAPARIN SODIUM 40 MG/0.4ML IJ SOSY: 40.0000 mg | PREFILLED_SYRINGE | Freq: Every day | INTRAMUSCULAR | Status: DC

## 2024-03-06 MED ORDER — ONDANSETRON HCL 4 MG PO TABS: 4.0000 mg | ORAL_TABLET | Freq: Four times a day (QID) | ORAL | Status: DC | PRN

## 2024-03-06 MED ORDER — BISACODYL 5 MG PO TBEC: 5.0000 mg | DELAYED_RELEASE_TABLET | Freq: Every day | ORAL | Status: DC | PRN

## 2024-03-06 MED ORDER — ONDANSETRON HCL 4 MG/2ML IJ SOLN: 4.0000 mg | Freq: Four times a day (QID) | INTRAMUSCULAR | Status: DC | PRN

## 2024-03-06 MED ORDER — SODIUM CHLORIDE 0.9 % IV BOLUS
1000.0000 mL | Freq: Once | INTRAVENOUS | Status: AC
  Administered 2024-03-06: 1000 mL via INTRAVENOUS

## 2024-03-06 MED ORDER — ALBUTEROL SULFATE (2.5 MG/3ML) 0.083% IN NEBU
2.5000 mg | INHALATION_SOLUTION | RESPIRATORY_TRACT | Status: DC | PRN
Start: 2024-03-06 — End: 2024-03-08

## 2024-03-06 MED ORDER — ACETAMINOPHEN 650 MG RE SUPP: 650.0000 mg | Freq: Four times a day (QID) | RECTAL | Status: DC | PRN

## 2024-03-06 NOTE — ED Notes (Signed)
 Pt in bed, pt had a large soft bm, cleaned pt and changed needed bedding, pt has a stage two pressure ulcer on she sacrum, padded foam dressing placed, pt awake and verbal, but still will not answer questions family at bedside.

## 2024-03-06 NOTE — ED Provider Notes (Signed)
 [facility] Provider Note    Event Date/Time   First MD Initiated Contact with Patient 03/06/24 1844     (approximate)   History   Weakness   HPI  Barbara Butler is a 89 y.o. female with a history of type 2 diabetes, hypertension, hyperlipidemia, iron deficiency anemia, and diabetic nephropathy who presents with weakness since around 230 this afternoon, generalized, associated with lethargy.  The patient also had some shaking in her right hand that has now resolved.  Her family reports that her mental status has been in and out and that she has been more confused than normal.  The patient herself denies any acute pain.  I reviewed the past medical records.  The patient's most recent outpatient encounter was with endocrinology on 8/18 for follow-up of her diabetes.   Physical Exam   Triage Vital Signs: ED Triage Vitals  Encounter Vitals Group     BP 03/06/24 1844 (!) 152/113     Girls Systolic BP Percentile --      Girls Diastolic BP Percentile --      Boys Systolic BP Percentile --      Boys Diastolic BP Percentile --      Pulse Rate 03/06/24 1844 (!) 104     Resp 03/06/24 1844 18     Temp 03/06/24 1844 98.8 F (37.1 C)     Temp Source 03/06/24 1844 Oral     SpO2 03/06/24 1844 97 %     Weight 03/06/24 1845 114 lb (51.7 kg)     Height 03/06/24 1845 5' 4 (1.626 m)     Head Circumference --      Peak Flow --      Pain Score 03/06/24 1845 0     Pain Loc --      Pain Education --      Exclude from Growth Chart --     Most recent vital signs: Vitals:   03/06/24 2330 03/07/24 0007  BP: 121/82 (!) 149/77  Pulse: 89 (!) 59  Resp: 14 17  Temp:  98.1 F (36.7 C)  SpO2: 99% 97%    General: Awake, oriented x 2, weak appearing, no distress.  CV:  Good peripheral perfusion.  Resp:  Normal effort.  Lungs CTAB. Abd:  Soft and nontender.  No distention.  Other:  Moist mucous membranes.  EOMI.  PERRLA.  No facial droop.  Motor intact in all  extremities.   ED Results / Procedures / Treatments   Labs (all labs ordered are listed, but only abnormal results are displayed) Labs Reviewed  COMPREHENSIVE METABOLIC PANEL WITH GFR - Abnormal; Notable for the following components:      Result Value   Sodium 133 (*)    CO2 21 (*)    Glucose, Bld 248 (*)    Albumin 3.4 (*)    AST 214 (*)    ALT 67 (*)    Alkaline Phosphatase 179 (*)    Total Bilirubin 3.7 (*)    All other components within normal limits  CBC WITH DIFFERENTIAL/PLATELET - Abnormal; Notable for the following components:   WBC 11.4 (*)    Hemoglobin 11.4 (*)    HCT 35.0 (*)    RDW 16.7 (*)    Neutro Abs 10.1 (*)    Abs Immature Granulocytes 0.11 (*)    All other components within normal limits  LACTIC ACID, PLASMA - Abnormal; Notable for the following components:   Lactic Acid, Venous 2.4 (*)  All other components within normal limits  LACTIC ACID, PLASMA - Abnormal; Notable for the following components:   Lactic Acid, Venous 2.9 (*)    All other components within normal limits  CBG MONITORING, ED - Abnormal; Notable for the following components:   Glucose-Capillary 255 (*)    All other components within normal limits  TROPONIN I (HIGH SENSITIVITY) - Abnormal; Notable for the following components:   Troponin I (High Sensitivity) 20 (*)    All other components within normal limits  TROPONIN I (HIGH SENSITIVITY) - Abnormal; Notable for the following components:   Troponin I (High Sensitivity) 24 (*)    All other components within normal limits  URINE CULTURE  AMMONIA  URINALYSIS, ROUTINE W REFLEX MICROSCOPIC  CBC  CREATININE, SERUM     EKG  ED ECG REPORT I, Waylon Cassis, the attending physician, personally viewed and interpreted this ECG.  Date: 03/06/2024 EKG Time: 1845 Rate: 99 Rhythm: Atrial fibrillation QRS Axis: normal Intervals: RBBB ST/T Wave abnormalities: Nonspecific T wave abnormality Narrative Interpretation: no evidence of  acute ischemia    RADIOLOGY  CT head: I independently viewed and interpreted the images; there is no ICH.  Radiology report indicates no acute abnormality.  PROCEDURES:  Critical Care performed: No  Procedures   MEDICATIONS ORDERED IN ED: Medications  acetaminophen  (TYLENOL ) tablet 650 mg (has no administration in time range)    Or  acetaminophen  (TYLENOL ) suppository 650 mg (has no administration in time range)  polyethylene glycol (MIRALAX  / GLYCOLAX ) packet 17 g (has no administration in time range)  bisacodyl  (DULCOLAX) EC tablet 5 mg (has no administration in time range)  ondansetron  (ZOFRAN ) tablet 4 mg (has no administration in time range)    Or  ondansetron  (ZOFRAN ) injection 4 mg (has no administration in time range)  albuterol  (PROVENTIL ) (2.5 MG/3ML) 0.083% nebulizer solution 2.5 mg (has no administration in time range)  enoxaparin  (LOVENOX ) injection 40 mg (has no administration in time range)  sodium chloride  0.9 % bolus 1,000 mL (0 mLs Intravenous Stopped 03/06/24 2159)     IMPRESSION / MDM / ASSESSMENT AND PLAN / ED COURSE  I reviewed the triage vital signs and the nursing notes.  89 year old female with PMH as noted above presents with generalized weakness, lethargy, and altered mental status since this afternoon.  On exam she is borderline tachycardic.  She has no focal neurologic deficits and is oriented x 2.  Differential diagnosis includes, but is not limited to, dehydration, electrolyte abnormality, hypoglycemia, other metabolic etiology, urinary tract infection, other acute infection, less likely CNS or cardiac cause.  We will obtain CT head, lab workup, urinalysis, and reassess.  Patient's presentation is most consistent with acute presentation with potential threat to life or bodily function.  The patient is on the cardiac monitor to evaluate for evidence of arrhythmia and/or significant heart rate  changes.  ----------------------------------------- 12:10 AM on 03/07/2024 -----------------------------------------  CT head was negative.  Lactate was elevated.  CBC shows mild leukocytosis.  CMP shows elevated LFTs but these are downtrending from prior.  Ammonia is normal.  Urinalysis is still pending.  I still suspect UTI is the most likely etiology the patient's symptoms.  Given the patient's continued weakness and AMS she will need admission for further monitoring.  I consulted Dr. Lenon from the hospitalist service; based on our discussion she agrees to evaluate the patient for admission.   FINAL CLINICAL IMPRESSION(S) / ED DIAGNOSES   Final diagnoses:  Generalized weakness  Altered mental status,  unspecified altered mental status type     Rx / DC Orders   ED Discharge Orders     None        Note:  This document was prepared using Dragon voice recognition software and may include unintentional dictation errors.    Jacolyn Pae, MD 03/07/24 [phone]

## 2024-03-06 NOTE — ED Triage Notes (Signed)
 Pt to er via ems, per ems pt has vomited a few times in route to the hospital, states that today she has been more confused and lethargic. Pt awake, pt not answering questions, but follows commands.

## 2024-03-06 NOTE — ED Notes (Signed)
 Critical called by Cecilia Essex in the Lab. Pt's lactic is 2.4. Dr Jacolyn notified by this RN.

## 2024-03-06 NOTE — H&P (Signed)
 History and Physical  AMENA DOCKHAM FMW:[id number] DOB: [birth year] DOA: 03/06/2024 PCP: Sadie Manna, MD  Chief Complaint: AMS Historian: patient  HPI:  Barbara Butler is a 89 y.o. female with a PMH significant for atrial fibrillation on Eliquis , hypertension, type 2 diabetes mellitus, CVA, hypothyroidism. At baseline, they live at home with a 24/7 caregiver and very involved family members and are mostly independent with their ADLs. She walks with a walker a small amount.   They presented from home to the ED on 03/06/2024 with AMS and vomiting after eating soup for dinner tonight. She had been acting a bit agitated throughout the day and caregivers thought it was due to an episode of stool incontinence.  She did not express any specific complaints. She was not answering questions like she typically does which is oriented x4.  Patient is unable to provide any history during encounter and information comes from [facility] who was with her during the evening and witnessed her episode of vomiting.   In the ED, it was found that they had stable BP, and oxygenation. Mild tachycardia which was afib. afebrile.  Significant findings included: Na+ 133, K+ 3.8, glucose 248, Cr 072. Liver enzymes mildly elevated in setting of known liver disease but better than previous baseline and ammonia is normal. LA 2.4> 2.9 despite IVF. Troponin 20>24. WBC 11.4, hgb 11.4, platelets 364.  Urinalysis pending. Patient has been incontinent in bed.  CT head: no acute abnormality  They were initially treated with IVF.   Patient was admitted to medicine service for further workup and management of AMS as outlined in detail below.  Assessment/Plan Principal Problem:   AMS (altered mental status)  AMS- suspect infection given LA, leukocytosis, AMS, vomiting. Did not receive completed workup nor treatment in ED.  - blood cultures, chest xray, urinalysis ordered.   - consider CT abdomen if initial workup not  revealing.  - start empiric antibiotics.  - continue IVF - CBC am  Afib- poorly controlled. Cannot assess symptoms. Head CT negative acute stroke.  - continue home eliquis  - continuous telemetry  - continue home metoprolol    Type II DM with hyperglycemia - sSSI  HTN- continue home losartan as irbesartan  per formulary Lower dose ordered in setting of illness   Hyporthyroidism- continue home med - check TSH   Past Medical History:  Diagnosis Date   Anemia    Atrial fibrillation (HCC)    Diabetes mellitus without complication (HCC)    diet controlled   Diabetic neuropathy (HCC)    Dysrhythmia    Hypertension    Hypothyroidism    Stroke (HCC) 01/2023   multiple strokes found at this time    Past Surgical History:  Procedure Laterality Date   CATARACT EXTRACTION W/PHACO Right 01/28/2019   Procedure: CATARACT EXTRACTION PHACO AND INTRAOCULAR LENS PLACEMENT (IOC) RIGHT DIABETES;  Surgeon: Ferol Rogue, MD;  Location: [facility] ORS;  Service: Ophthalmology;  Laterality: Right;  US  00:49.3 CDE 8.12 Fluid Pack Lot # [id number]   CATARACT EXTRACTION W/PHACO Left 02/25/2019   Procedure: CATARACT EXTRACTION PHACO AND INTRAOCULAR LENS PLACEMENT (IOC), LEFT, DIABETIC, VISION BLUE;  Surgeon: Ferol Rogue, MD;  Location: [facility] ORS;  Service: Ophthalmology;  Laterality: Left;  US   01:10 CDE 11.43 Fluid pack lot # [id number]   EMBOLIZATION (CATH LAB) N/A 09/19/2023   Procedure: EMBOLIZATION;  Surgeon: Elnor Norleen CROME, MD;  Location: [facility] INVASIVE CV LAB;  Service: Cardiovascular;  Laterality: N/A;   ERCP N/A 12/15/2023   Procedure: ERCP, WITH  INTERVENTION IF INDICATED;  Surgeon: Jinny Carmine, MD;  Location: [facility] ENDOSCOPY;  Service: Endoscopy;  Laterality: N/A;   ESOPHAGOGASTRODUODENOSCOPY N/A 09/18/2023   Procedure: EGD (ESOPHAGOGASTRODUODENOSCOPY);  Surgeon: Toledo, Ladell POUR, MD;  Location: [facility] ENDOSCOPY;  Service: Gastroenterology;  Laterality: N/A;   FLEXIBLE SIGMOIDOSCOPY N/A 09/18/2023    Procedure: SIGMOIDOSCOPY, FLEXIBLE;  Surgeon: Toledo, Ladell POUR, MD;  Location: [facility] ENDOSCOPY;  Service: Gastroenterology;  Laterality: N/A;   FRACTURE SURGERY Right 02/2023   hip   INTRAMEDULLARY (IM) NAIL INTERTROCHANTERIC Right 02/06/2023   Procedure: INTRAMEDULLARY NAILING OF RIGHT FEMUR;  Surgeon: Kendal Franky SQUIBB, MD;  [facility];  Service: Orthopedics;  Laterality: Right;   ORIF WRIST FRACTURE Right 06/28/2016   Procedure: OPEN REDUCTION INTERNAL FIXATION (ORIF) WRIST FRACTURE;  Surgeon: Ozell Flake, MD;  Location: [facility] ORS;  Service: Orthopedics;  Laterality: Right;   SALPINGECTOMY       reports that she quit smoking about 65 years ago. Her smoking use included cigarettes. She has never used smokeless tobacco. She reports that she does not currently use alcohol. She reports that she does not use drugs.  No Known Allergies  Family History  Problem Relation Age of Onset   Breast cancer Neg Hx     Prior to Admission medications   Medication Sig Start Date End Date Taking? Authorizing Provider  metFORMIN  (GLUCOPHAGE ) 500 MG tablet Take 1 tablet (500 mg total) by mouth 2 (two) times daily with a meal. Patient taking differently: Take 500 mg by mouth daily with breakfast. 06/17/23  Yes Wouk, Devaughn Sayres, MD  metoprolol  succinate (TOPROL -XL) 50 MG 24 hr tablet Take 1 tablet (50 mg total) by mouth daily. Take with or immediately following a meal. 05/27/23 03/06/24 Yes Furth, Cadence H, PA-C  olmesartan (BENICAR) 40 MG tablet Take 40 mg by mouth daily.   Yes [provider]  apixaban  (ELIQUIS ) 2.5 MG TABS tablet TAKE 1 TABLET TWICE DAILY 01/15/24   Darron Deatrice LABOR, MD  furosemide  (LASIX ) 20 MG tablet Take 2 tablets (40 mg total) by mouth daily as needed for fluid or edema. 12/18/23   Amin, Sumayya, MD  insulin  glargine (LANTUS ) 100 UNIT/ML injection Inject 18 Units into the skin daily.    [provider]  levothyroxine  (SYNTHROID , LEVOTHROID) 88 MCG tablet Take 88  mcg by mouth daily.  12/05/15   [provider]  Multiple Vitamins-Minerals (CENTRUM SILVER 50+WOMEN PO) Take 1 tablet by mouth daily.    [provider]  polyethylene glycol (MIRALAX  / GLYCOLAX ) 17 g packet Take 17 g by mouth daily as needed for mild constipation. 02/10/23   Sebastian Toribio GAILS, MD   I have personally, briefly reviewed patient's prior medical records in [facility]  Objective: Blood pressure (!) 151/92, pulse (!) 102, temperature 98.8 F (37.1 C), temperature source Oral, resp. rate (!) 24, height 5' 4 (1.626 m), weight 51.7 kg, SpO2 91%.   Constitutional: asleep. Minimally responsive to verbal or tactile stimuli. Briefly opens eyes but does not respond or follow commands.  Neck: normal, supple, no masses, no thyromegaly Respiratory: CTAB, no wheezing, no crackles. Normal respiratory effort. No accessory muscle use.  Cardiovascular: Rapid and irregular  Abdomen: soft, NT, ND, no masses or HSM palpated. Musculoskeletal: No joint deformity upper and lower extremities. Normal muscle tone.  Skin: dry, intact, normal color, normal temperature on exposed skin  Labs on Admission: I have personally reviewed admission labs and imaging studies  CBC    Component Value Date/Time   WBC  11.4 (H) 03/06/2024 1930   RBC 4.34 03/06/2024 1930   HGB 11.4 (L) 03/06/2024 1930   HGB 9.9 (L) 10/03/2023 1402   HCT 35.0 (L) 03/06/2024 1930   HCT 31.0 (L) 10/03/2023 1402   PLT 364 03/06/2024 1930   PLT 468 (H) 10/03/2023 1402   MCV 80.6 03/06/2024 1930   MCV 87 10/03/2023 1402   MCH 26.3 03/06/2024 1930   MCHC 32.6 03/06/2024 1930   RDW 16.7 (H) 03/06/2024 1930   RDW 16.1 (H) 10/03/2023 1402   LYMPHSABS 0.7 03/06/2024 1930   MONOABS 0.4 03/06/2024 1930   EOSABS 0.0 03/06/2024 1930   BASOSABS 0.0 03/06/2024 1930   CMP     Component Value Date/Time   NA 133 (L) 03/06/2024 1930   K 3.8 03/06/2024 1930   CL 100 03/06/2024 1930   CO2 21 (L) 03/06/2024 1930    GLUCOSE 248 (H) 03/06/2024 1930   BUN 19 03/06/2024 1930   CREATININE 0.72 03/06/2024 1930   CALCIUM  9.0 03/06/2024 1930   PROT 7.5 03/06/2024 1930   ALBUMIN 3.4 (L) 03/06/2024 1930   AST 214 (H) 03/06/2024 1930   ALT 67 (H) 03/06/2024 1930   ALKPHOS 179 (H) 03/06/2024 1930   BILITOT 3.7 (H) 03/06/2024 1930   GFRNONAA >60 03/06/2024 1930    Radiological Exams on Admission: CT Head Wo Contrast Result Date: 03/06/2024 EXAM: CT HEAD WITHOUT CONTRAST 03/06/2024 07:41:00 PM TECHNIQUE: CT of the head was performed without the administration of intravenous contrast. Automated exposure control, iterative reconstruction, and/or weight based adjustment of the mA/kV was utilized to reduce the radiation dose to as low as reasonably achievable. COMPARISON: 06/28/2023 CLINICAL HISTORY: Mental status change, unknown cause. FINDINGS: BRAIN AND VENTRICLES: No acute hemorrhage. No evidence of acute infarct. No hydrocephalus. No extra-axial collection. No mass effect or midline shift. Stable atrophy and chronic small vessel ischemia. ORBITS: No acute abnormality. SINUSES: No acute abnormality. SOFT TISSUES AND SKULL: No acute soft tissue abnormality. No skull fracture. IMPRESSION: 1. No acute intracranial abnormality. 2. Stable atrophy and chronic small vessel ischemia. Electronically signed by: Andrea Gasman MD 03/06/2024 07:55 PM EDT RP Workstation: [id number]   EKG: Independently reviewed. Afib 99bpm  DVT prophylaxis: enoxaparin  (LOVENOX ) injection 40 mg Start: 03/06/24 2330 SCDs Start: 03/06/24 2314   Code Status: DNR- confirmed with her daughter on the phone  Family Communication: [place]  Disposition Plan: admit for antibiotics   Consults called: none    Marien LITTIE Piety, DO [facility]  03/06/2024, 11:25 PM    To contact the appropriate [facility] Attending or Consulting provider: Check [email or web address] for coverage from 7pm-7am

## 2024-03-06 NOTE — H&P (Incomplete)
 History and Physical  Barbara Butler FMW:[id number] DOB: 1929-09-09 DOA: 03/06/2024 PCP: Sadie Manna, MD  Chief Complaint: AMS Historian: patient  HPI:  Barbara Butler is a 89 y.o. female with a PMH significant for atrial fibrillation on Eliquis , hypertension, type 2 diabetes mellitus, CVA, hypothyroidism. At baseline, they live *** and *** ADLs. 24hr caregiver. She walks with a walker a small amount.   They presented from *** to the ED on 03/06/2024 with *** x*** days. *** R arm shaking. She was cold. Ate a good amount of dinner but then vomited.  Typically fully oriented x4. Did not complain of abdominal pain, problems going to the bathroom,  Getting more agitated.  Bowel incontinence.   In the ED, it was found that they had ***.  Significant findings included:  They were initially treated with ***.   Patient was admitted to medicine service for further workup and management of *** as outlined in detail below.  Assessment/Plan Principal Problem:   AMS (altered mental status)  ***  ***  ***  ***  Past Medical History:  Diagnosis Date  . Anemia   . Atrial fibrillation (HCC)   . Diabetes mellitus without complication (HCC)    diet controlled  . Diabetic neuropathy (HCC)   . Dysrhythmia   . Hypertension   . Hypothyroidism   . Stroke [facility]) 01/2023   multiple strokes found at this time    Past Surgical History:  Procedure Laterality Date  . CATARACT EXTRACTION W/PHACO Right 01/28/2019   Procedure: CATARACT EXTRACTION PHACO AND INTRAOCULAR LENS PLACEMENT (IOC) RIGHT DIABETES;  Surgeon: Ferol Rogue, MD;  Location: [facility] ORS;  Service: Ophthalmology;  Laterality: Right;  US  00:49.3 CDE 8.12 Fluid Pack Lot # [id number]  . CATARACT EXTRACTION W/PHACO Left 02/25/2019   Procedure: CATARACT EXTRACTION PHACO AND INTRAOCULAR LENS PLACEMENT (IOC), LEFT, DIABETIC, VISION BLUE;  Surgeon: Ferol Rogue, MD;  Location: [facility] ORS;  Service: Ophthalmology;  Laterality: Left;   US   01:10 CDE 11.43 Fluid pack lot # [id number] H  . EMBOLIZATION (CATH LAB) N/A 09/19/2023   Procedure: EMBOLIZATION;  Surgeon: Elnor Norleen CROME, MD;  Location: [facility] INVASIVE CV LAB;  Service: Cardiovascular;  Laterality: N/A;  . ERCP N/A 12/15/2023   Procedure: ERCP, WITH INTERVENTION IF INDICATED;  Surgeon: Jinny Carmine, MD;  Location: [facility] ENDOSCOPY;  Service: Endoscopy;  Laterality: N/A;  . ESOPHAGOGASTRODUODENOSCOPY N/A 09/18/2023   Procedure: EGD (ESOPHAGOGASTRODUODENOSCOPY);  Surgeon: Toledo, Ladell POUR, MD;  Location: [facility] ENDOSCOPY;  Service: Gastroenterology;  Laterality: N/A;  . FLEXIBLE SIGMOIDOSCOPY N/A 09/18/2023   Procedure: SIGMOIDOSCOPY, FLEXIBLE;  Surgeon: Toledo, Ladell POUR, MD;  Location: [facility] ENDOSCOPY;  Service: Gastroenterology;  Laterality: N/A;  . FRACTURE SURGERY Right 02/2023   hip  . INTRAMEDULLARY (IM) NAIL INTERTROCHANTERIC Right 02/06/2023   Procedure: INTRAMEDULLARY NAILING OF RIGHT FEMUR;  Surgeon: Kendal Franky SQUIBB, MD;  Location: [facility] OR;  Service: Orthopedics;  Laterality: Right;  . ORIF WRIST FRACTURE Right 06/28/2016   Procedure: OPEN REDUCTION INTERNAL FIXATION (ORIF) WRIST FRACTURE;  Surgeon: Ozell Flake, MD;  Location: [facility] ORS;  Service: Orthopedics;  Laterality: Right;  . SALPINGECTOMY       reports that she quit smoking about 65 years ago. Her smoking use included cigarettes. She has never used smokeless tobacco. She reports that she does not currently use alcohol. She reports that she does not use drugs.  No Known Allergies  Family History  Problem Relation Age of Onset  . Breast cancer Neg Hx     Prior to  Admission medications   Medication Sig Start Date End Date Taking? Authorizing Provider  metFORMIN  (GLUCOPHAGE ) 500 MG tablet Take 1 tablet (500 mg total) by mouth 2 (two) times daily with a meal. Patient taking differently: Take 500 mg by mouth daily with breakfast. 06/17/23  Yes Wouk, Devaughn Sayres, MD  metoprolol  succinate (TOPROL -XL) 50 MG 24 hr  tablet Take 1 tablet (50 mg total) by mouth daily. Take with or immediately following a meal. 05/27/23 03/06/24 Yes Furth, Cadence H, PA-C  olmesartan (BENICAR) 40 MG tablet Take 40 mg by mouth daily.   Yes [provider]  apixaban  (ELIQUIS ) 2.5 MG TABS tablet TAKE 1 TABLET TWICE DAILY 01/15/24   Darron Deatrice LABOR, MD  furosemide  (LASIX ) 20 MG tablet Take 2 tablets (40 mg total) by mouth daily as needed for fluid or edema. 12/18/23   Amin, Sumayya, MD  insulin  glargine (LANTUS ) 100 UNIT/ML injection Inject 18 Units into the skin daily.    [provider]  levothyroxine  (SYNTHROID , LEVOTHROID) 88 MCG tablet Take 88 mcg by mouth daily.  12/05/15   [provider]  Multiple Vitamins-Minerals (CENTRUM SILVER 50+WOMEN PO) Take 1 tablet by mouth daily.    [provider]  polyethylene glycol (MIRALAX  / GLYCOLAX ) 17 g packet Take 17 g by mouth daily as needed for mild constipation. 02/10/23   Sebastian Toribio GAILS, MD   I have personally, briefly reviewed patient's prior medical records in [facility]  Objective: Blood pressure (!) 151/92, pulse (!) 102, temperature 98.8 F (37.1 C), temperature source Oral, resp. rate (!) 24, height 5' 4 (1.626 m), weight 51.7 kg, SpO2 91%.   Constitutional: NAD, calm, comfortable*** HEENT: lids and conjunctivae normal. MMM. Posterior pharynx clear of any exudate or lesions. Normal dentition.  Neck: normal, supple, no masses, no thyromegaly Respiratory: CTAB, no wheezing, no crackles. Normal respiratory effort. No accessory muscle use.  Cardiovascular: RRR, no murmurs / rubs / gallops. No extremity edema. 2+ pedal pulses. no clubbing / cyanosis.  Abdomen: soft, NT, ND, no masses or HSM palpated. Musculoskeletal: No joint deformity upper and lower extremities. Normal muscle tone.  Skin: dry, intact, normal color, normal temperature on exposed skin Neurologic: Alert and oriented x 3. Normal speech. Grossly non-focal exam.  PERRL Psychiatric: Normal mood. Congruent affect.  Labs on Admission: I have personally reviewed admission labs and imaging studies  CBC    Component Value Date/Time   WBC 11.4 (H) 03/06/2024 1930   RBC 4.34 03/06/2024 1930   HGB 11.4 (L) 03/06/2024 1930   HGB 9.9 (L) 10/03/2023 1402   HCT 35.0 (L) 03/06/2024 1930   HCT 31.0 (L) 10/03/2023 1402   PLT 364 03/06/2024 1930   PLT 468 (H) 10/03/2023 1402   MCV 80.6 03/06/2024 1930   MCV 87 10/03/2023 1402   MCH 26.3 03/06/2024 1930   MCHC 32.6 03/06/2024 1930   RDW 16.7 (H) 03/06/2024 1930   RDW 16.1 (H) 10/03/2023 1402   LYMPHSABS 0.7 03/06/2024 1930   MONOABS 0.4 03/06/2024 1930   EOSABS 0.0 03/06/2024 1930   BASOSABS 0.0 03/06/2024 1930   CMP     Component Value Date/Time   NA 133 (L) 03/06/2024 1930   K 3.8 03/06/2024 1930   CL 100 03/06/2024 1930   CO2 21 (L) 03/06/2024 1930   GLUCOSE 248 (H) 03/06/2024 1930   BUN 19 03/06/2024 1930   CREATININE 0.72 03/06/2024 1930   CALCIUM  9.0 03/06/2024 1930   PROT 7.5 03/06/2024 1930   ALBUMIN  3.4 (L) 03/06/2024 1930   AST 214 (H) 03/06/2024 1930   ALT 67 (H) 03/06/2024 1930   ALKPHOS 179 (H) 03/06/2024 1930   BILITOT 3.7 (H) 03/06/2024 1930   GFRNONAA >60 03/06/2024 1930    Radiological Exams on Admission: CT Head Wo Contrast Result Date: 03/06/2024 EXAM: CT HEAD WITHOUT CONTRAST 03/06/2024 07:41:00 PM TECHNIQUE: CT of the head was performed without the administration of intravenous contrast. Automated exposure control, iterative reconstruction, and/or weight based adjustment of the mA/kV was utilized to reduce the radiation dose to as low as reasonably achievable. COMPARISON: 06/28/2023 CLINICAL HISTORY: Mental status change, unknown cause. FINDINGS: BRAIN AND VENTRICLES: No acute hemorrhage. No evidence of acute infarct. No hydrocephalus. No extra-axial collection. No mass effect or midline shift. Stable atrophy and chronic small vessel ischemia. ORBITS: No acute abnormality.  SINUSES: No acute abnormality. SOFT TISSUES AND SKULL: No acute soft tissue abnormality. No skull fracture. IMPRESSION: 1. No acute intracranial abnormality. 2. Stable atrophy and chronic small vessel ischemia. Electronically signed by: Andrea Gasman MD 03/06/2024 07:55 PM EDT RP Workstation: [id number]    EKG: Independently reviewed. ***  DVT prophylaxis: enoxaparin  (LOVENOX ) injection 40 mg Start: 03/06/24 2330 SCDs Start: 03/06/24 2314   Code Status: ***  Family Communication: ***  Disposition Plan: ***  Consults called: PIERRETTE Marien LITTIE Lenon, DO [facility]  03/06/2024, 11:25 PM    To contact the appropriate [facility] Attending or Consulting provider: Check [email or web address] for coverage from 7pm-7am

## 2024-03-07 ENCOUNTER — Observation Stay

## 2024-03-07 ENCOUNTER — Other Ambulatory Visit: Payer: Self-pay | Admitting: Medical

## 2024-03-07 DIAGNOSIS — E1140 Type 2 diabetes mellitus with diabetic neuropathy, unspecified: Secondary | ICD-10-CM | POA: Diagnosis present

## 2024-03-07 DIAGNOSIS — F0390 Unspecified dementia without behavioral disturbance: Secondary | ICD-10-CM | POA: Diagnosis present

## 2024-03-07 DIAGNOSIS — R531 Weakness: Secondary | ICD-10-CM | POA: Diagnosis present

## 2024-03-07 DIAGNOSIS — I10 Essential (primary) hypertension: Secondary | ICD-10-CM | POA: Diagnosis present

## 2024-03-07 DIAGNOSIS — Z794 Long term (current) use of insulin: Secondary | ICD-10-CM | POA: Diagnosis not present

## 2024-03-07 DIAGNOSIS — J189 Pneumonia, unspecified organism: Secondary | ICD-10-CM | POA: Diagnosis not present

## 2024-03-07 DIAGNOSIS — E785 Hyperlipidemia, unspecified: Secondary | ICD-10-CM | POA: Diagnosis present

## 2024-03-07 DIAGNOSIS — K769 Liver disease, unspecified: Secondary | ICD-10-CM | POA: Diagnosis present

## 2024-03-07 DIAGNOSIS — I4891 Unspecified atrial fibrillation: Secondary | ICD-10-CM | POA: Diagnosis present

## 2024-03-07 DIAGNOSIS — H9190 Unspecified hearing loss, unspecified ear: Secondary | ICD-10-CM | POA: Diagnosis present

## 2024-03-07 DIAGNOSIS — Z515 Encounter for palliative care: Secondary | ICD-10-CM | POA: Diagnosis not present

## 2024-03-07 DIAGNOSIS — Z87891 Personal history of nicotine dependence: Secondary | ICD-10-CM | POA: Diagnosis not present

## 2024-03-07 DIAGNOSIS — Z66 Do not resuscitate: Secondary | ICD-10-CM | POA: Diagnosis present

## 2024-03-07 DIAGNOSIS — L89152 Pressure ulcer of sacral region, stage 2: Secondary | ICD-10-CM | POA: Diagnosis present

## 2024-03-07 DIAGNOSIS — Z79890 Hormone replacement therapy (postmenopausal): Secondary | ICD-10-CM | POA: Diagnosis not present

## 2024-03-07 DIAGNOSIS — E1165 Type 2 diabetes mellitus with hyperglycemia: Secondary | ICD-10-CM | POA: Diagnosis present

## 2024-03-07 DIAGNOSIS — E1121 Type 2 diabetes mellitus with diabetic nephropathy: Secondary | ICD-10-CM | POA: Diagnosis present

## 2024-03-07 DIAGNOSIS — E1169 Type 2 diabetes mellitus with other specified complication: Secondary | ICD-10-CM | POA: Diagnosis not present

## 2024-03-07 DIAGNOSIS — Z7901 Long term (current) use of anticoagulants: Secondary | ICD-10-CM | POA: Diagnosis not present

## 2024-03-07 DIAGNOSIS — Z79899 Other long term (current) drug therapy: Secondary | ICD-10-CM | POA: Diagnosis not present

## 2024-03-07 DIAGNOSIS — Z7984 Long term (current) use of oral hypoglycemic drugs: Secondary | ICD-10-CM | POA: Diagnosis not present

## 2024-03-07 DIAGNOSIS — E039 Hypothyroidism, unspecified: Secondary | ICD-10-CM | POA: Diagnosis present

## 2024-03-07 DIAGNOSIS — R4182 Altered mental status, unspecified: Secondary | ICD-10-CM | POA: Diagnosis not present

## 2024-03-07 DIAGNOSIS — Z8673 Personal history of transient ischemic attack (TIA), and cerebral infarction without residual deficits: Secondary | ICD-10-CM | POA: Diagnosis not present

## 2024-03-07 LAB — URINALYSIS, ROUTINE W REFLEX MICROSCOPIC
Bacteria, UA: NONE SEEN
Bilirubin Urine: NEGATIVE
Glucose, UA: NEGATIVE mg/dL
Hgb urine dipstick: NEGATIVE
Ketones, ur: NEGATIVE mg/dL
Leukocytes,Ua: NEGATIVE
Nitrite: NEGATIVE
Protein, ur: 100 mg/dL — AB
Specific Gravity, Urine: 1.017 (ref 1.005–1.030)
pH: 5 (ref 5.0–8.0)

## 2024-03-07 LAB — GLUCOSE, CAPILLARY
Glucose-Capillary: 196 mg/dL — ABNORMAL HIGH (ref 70–99)
Glucose-Capillary: 208 mg/dL — ABNORMAL HIGH (ref 70–99)
Glucose-Capillary: 175 mg/dL — ABNORMAL HIGH (ref 70–99)
Glucose-Capillary: 227 mg/dL — ABNORMAL HIGH (ref 70–99)

## 2024-03-07 LAB — CREATININE, SERUM
Creatinine, Ser: 0.7 mg/dL (ref 0.44–1.00)
GFR, Estimated: >60 mL/min (ref >60)

## 2024-03-07 LAB — CBC
HCT: 40.3 % (ref 36.0–46.0)
Hemoglobin: 12.7 g/dL (ref 12.0–15.0)
MCH: 26.2 pg (ref 26.0–34.0)
MCHC: 31.5 g/dL (ref 30.0–36.0)
MCV: 83.3 fL (ref 80.0–100.0)
Platelets: 335 K/uL (ref 150–400)
RBC: 4.84 MIL/uL (ref 3.87–5.11)
RDW: 17.1 % — ABNORMAL HIGH (ref 11.5–15.5)
WBC: 12.6 K/uL — ABNORMAL HIGH (ref 4.0–10.5)
nRBC: 0 % (ref 0.0–0.2)

## 2024-03-07 LAB — TSH: TSH: 4.206 u[IU]/mL (ref 0.350–4.500)

## 2024-03-07 LAB — LACTIC ACID, PLASMA
Lactic Acid, Venous: 3.5 mmol/L (ref 0.5–1.9)
Lactic Acid, Venous: 3.9 mmol/L (ref 0.5–1.9)

## 2024-03-07 MED ORDER — INSULIN ASPART 100 UNIT/ML IJ SOLN
0.0000 [IU] | Freq: Three times a day (TID) | INTRAMUSCULAR | Status: DC
  Administered 2024-03-07 (×2): 2 [IU] via SUBCUTANEOUS
  Administered 2024-03-07: 3 [IU] via SUBCUTANEOUS
  Administered 2024-03-08: 7 [IU] via SUBCUTANEOUS
  Administered 2024-03-08: 2 [IU] via SUBCUTANEOUS
  Filled 2024-03-07 (×5): qty 1

## 2024-03-07 MED ORDER — SODIUM CHLORIDE 0.9 % IV SOLN
1.0000 g | INTRAVENOUS | Status: DC
  Administered 2024-03-07 – 2024-03-08 (×2): 1 g via INTRAVENOUS
  Filled 2024-03-07 (×2): qty 10

## 2024-03-07 MED ORDER — SODIUM CHLORIDE 0.9 % IV SOLN
500.0000 mg | INTRAVENOUS | Status: DC
  Administered 2024-03-07: 500 mg via INTRAVENOUS
  Filled 2024-03-07 (×2): qty 5

## 2024-03-07 MED ORDER — SODIUM CHLORIDE 0.9 % IV SOLN: INTRAVENOUS | Status: DC

## 2024-03-07 MED ORDER — LEVOTHYROXINE SODIUM 88 MCG PO TABS
88.0000 ug | ORAL_TABLET | Freq: Every day | ORAL | Status: DC
  Administered 2024-03-07 – 2024-03-08 (×2): 88 ug via ORAL
  Filled 2024-03-07 (×2): qty 1

## 2024-03-07 MED ORDER — APIXABAN 2.5 MG PO TABS
2.5000 mg | ORAL_TABLET | Freq: Two times a day (BID) | ORAL | Status: DC
  Administered 2024-03-07 – 2024-03-08 (×3): 2.5 mg via ORAL
  Filled 2024-03-07 (×3): qty 1

## 2024-03-07 MED ORDER — IRBESARTAN 150 MG PO TABS
150.0000 mg | ORAL_TABLET | Freq: Every day | ORAL | Status: DC
  Administered 2024-03-07: 150 mg via ORAL
  Filled 2024-03-07: qty 1

## 2024-03-07 MED ORDER — METOPROLOL SUCCINATE ER 50 MG PO TB24
50.0000 mg | ORAL_TABLET | Freq: Every day | ORAL | Status: DC
  Administered 2024-03-07 – 2024-03-08 (×2): 50 mg via ORAL
  Filled 2024-03-07 (×2): qty 1

## 2024-03-07 NOTE — Progress Notes (Signed)
 [facility] LIAISON NOTE   Received request from Shanea Lining,  Transitions of Care (TOC), for hospice services at home after discharge. Spoke with Florencio Essex, HCPOA to initiate education related to hospice philosophy, services, and team approach to care. Patient/family verbalized understanding of information given. Per discussion, the plan is for discharge home by private vehicle   DME needs discussed.  Patient has the following equipment in the home:  walker, wheelchair   Please send signed and completed DNR home with patient/family if applicable.   Please provide prescriptions at discharge as needed to ensure ongoing symptom management.   [facility] information and contact numbers given to [place]  Above information shared with Marinda Cooks, TOC and hospital medical care team.   Please call with any hospice related questions or concerns.  Thank you for the opportunity to participate in this patient's care.   Saddie HILARIO Na, MA, BSN, RN, FNE Nurse Liaison [phone]

## 2024-03-07 NOTE — TOC Progression Note (Signed)
 Transition of Care [facility]) - Progression Note    Patient Details  Name: Barbara Butler MRN: [id number] Date of Birth: Jul 23, 1929  [facility] CM/SW Contact  Marinda Cooks, RN Phone Number: 03/07/2024, 1:28 PM  Clinical Narrative:     This CM  alerted  by medical team pt and pt's HCPOA ( Barbara Butler ) expressed the desire to pursue Hospice services. This CM called and spoke with Barbara Butler introduced role and discussed dc plan/ recommendation for hospice and provided choice, [name] confirmed pt & her self would like to move forward with agency [facility] . This CM updated medical team and POC with [facility] . TOC will cont to follow dc planning / care coordination and update as applicable.                     Expected Discharge Plan and Services                                               Social Drivers of Health (SDOH) Interventions SDOH Screenings   Food Insecurity: Patient Unable To Answer (03/07/2024)  Housing: Unknown (03/07/2024)  Transportation Needs: Patient Unable To Answer (03/07/2024)  Utilities: Patient Unable To Answer (03/07/2024)  Financial Resource Strain: Low Risk  (02/23/2024)   Received from [facility]  Social Connections: Patient Unable To Answer (03/07/2024)  Recent Concern: Social Connections - Moderately Isolated (12/14/2023)  Tobacco Use: Medium Risk (03/06/2024)    Readmission Risk Interventions    02/07/2023    3:03 PM  Readmission Risk Prevention Plan  Transportation Screening Complete  PCP or Specialist Appt within 5-7 Days Complete  Home Care Screening Complete  Medication Review (RN CM) Complete

## 2024-03-07 NOTE — Progress Notes (Signed)
 PROGRESS NOTE    Barbara Butler  FMW:[id number] DOB: July 19, 1929 DOA: 03/06/2024 PCP: Sadie Manna, MD   Brief Narrative:   89 y.o. female with a PMH significant for atrial fibrillation on Eliquis , hypertension, type 2 diabetes mellitus, CVA, hypothyroidism admitted for AMS  8/31: MRI brain, Hospice screening   Assessment & Plan:   Principal Problem:   AMS (altered mental status)   AMS-  Possible sepsis - elevated lactic acid, infection source - lung? - start IVFs and monitor - blood cultures pending, chest xray s/o new infiltrate, will add azithromycin  to cover for pnuemonia - consider CT abdomen if initial workup not revealing.  - continue empiric antibiotics.    Afib- poorly controlled. Cannot assess symptoms. Head CT negative acute stroke.  - continue home eliquis  - continuous telemetry  - continue home metoprolol  for rate control   Type II DM with hyperglycemia - SSI   HTN- continue home losartan as irbesartan     Hyporthyroidism-  - normal TSH      DVT prophylaxis: Eliquis  apixaban  (ELIQUIS ) tablet 2.5 mg Start: 03/07/24 1000 SCDs Start: 03/06/24 2314 apixaban  (ELIQUIS ) tablet 2.5 mg     Code Status: DNR Family Communication: Updated HCPOA over phone Disposition Plan: possible D/C in 1-2 days depending on clinical condition     Antimicrobials:  Rocephin   azithromycin    Subjective:  Very hard of hearing. Doesn't know why she is here  Objective: Vitals:   03/06/24 2330 03/07/24 0007 03/07/24 0440 03/07/24 0830  BP: 121/82 (!) 149/77 138/88 (!) 148/83  Pulse: 89 (!) 59 83 82  Resp: 14 17 18 19   Temp:  98.1 F (36.7 C) 98.2 F (36.8 C) (!) 97.5 F (36.4 C)  TempSrc:    Oral  SpO2: 99% 97% 99% 97%  Weight:      Height:        Intake/Output Summary (Last 24 hours) at 03/07/2024 1337 Last data filed at 03/07/2024 0900 Gross per 24 hour  Intake 1623.99 ml  Output 400 ml  Net 1223.99 ml   Filed Weights   03/06/24 1845  Weight: 51.7  kg    Examination:  General exam: Appears calm and comfortable  Respiratory system: Clear to auscultation. Respiratory effort normal. Cardiovascular system: S1 & S2 heard, RRR. No JVD, murmurs, rubs, gallops or clicks. No pedal edema. Gastrointestinal system: Abdomen is nondistended, soft and nontender. No organomegaly or masses felt. Normal bowel sounds heard. Central nervous system: Alert and oriented. No focal neurological deficits. Extremities: Symmetric 5 x 5 power. Skin: No rashes, lesions or ulcers Psychiatry: Judgement and insight appear normal. Mood & affect appropriate.     Data Reviewed: I have personally reviewed following labs and imaging studies  CBC: Recent Labs  Lab 03/06/24 1930 03/07/24 0053  WBC 11.4* 12.6*  NEUTROABS 10.1*  --   HGB 11.4* 12.7  HCT 35.0* 40.3  MCV 80.6 83.3  PLT 364 335   Basic Metabolic Panel: Recent Labs  Lab 03/06/24 1930 03/07/24 0053  NA 133*  --   K 3.8  --   CL 100  --   CO2 21*  --   GLUCOSE 248*  --   BUN 19  --   CREATININE 0.72 0.70  CALCIUM  9.0  --    GFR: Estimated Creatinine Clearance: 36.6 mL/min (by C-G formula based on SCr of 0.7 mg/dL). Liver Function Tests: Recent Labs  Lab 03/06/24 1930  AST 214*  ALT 67*  ALKPHOS 179*  BILITOT 3.7*  PROT 7.5  ALBUMIN 3.4*   No results for input(s): LIPASE, AMYLASE in the last 168 hours. Recent Labs  Lab 03/06/24 2159  AMMONIA 17    CBG: Recent Labs  Lab 03/06/24 1850 03/07/24 0745 03/07/24 1325  GLUCAP 255* 196* 208*   Lipid Profile: No results for input(s): CHOL, HDL, LDLCALC, TRIG, CHOLHDL, LDLDIRECT in the last 72 hours. Thyroid  Function Tests: Recent Labs    03/07/24 0524  TSH 4.206   Anemia Panel: No results for input(s): VITAMINB12, FOLATE, FERRITIN, TIBC, IRON, RETICCTPCT in the last 72 hours. Sepsis Labs: Recent Labs  Lab 03/06/24 1954 03/06/24 2159 03/07/24 1004 03/07/24 1232  LATICACIDVEN 2.4* 2.9*  3.5* 3.9*    No results found for this or any previous visit (from the past 240 hours).       Radiology Studies: DG Chest Port 1 View Result Date: 03/07/2024 CLINICAL DATA:  Altered mental status EXAM: PORTABLE CHEST 1 VIEW COMPARISON:  02/05/2023 FINDINGS: Interval development diffuse interstitial pulmonary infiltrate which may reflect changes of airway inflammation or edema superimposed upon underlying interstitial lung disease. No focal consolidation. No pneumothorax or pleural effusion. Cardiac size is mildly enlarged. No acute bone abnormality. IMPRESSION: 1. Interval development of diffuse interstitial pulmonary infiltrate which may reflect changes of airway inflammation or edema superimposed upon underlying interstitial lung disease. In a Electronically Signed   By: Dorethia Molt M.D.   On: 03/07/2024 02:36   CT Head Wo Contrast Result Date: 03/06/2024 EXAM: CT HEAD WITHOUT CONTRAST 03/06/2024 07:41:00 PM TECHNIQUE: CT of the head was performed without the administration of intravenous contrast. Automated exposure control, iterative reconstruction, and/or weight based adjustment of the mA/kV was utilized to reduce the radiation dose to as low as reasonably achievable. COMPARISON: 06/28/2023 CLINICAL HISTORY: Mental status change, unknown cause. FINDINGS: BRAIN AND VENTRICLES: No acute hemorrhage. No evidence of acute infarct. No hydrocephalus. No extra-axial collection. No mass effect or midline shift. Stable atrophy and chronic small vessel ischemia. ORBITS: No acute abnormality. SINUSES: No acute abnormality. SOFT TISSUES AND SKULL: No acute soft tissue abnormality. No skull fracture. IMPRESSION: 1. No acute intracranial abnormality. 2. Stable atrophy and chronic small vessel ischemia. Electronically signed by: Andrea Gasman MD 03/06/2024 07:55 PM EDT RP Workstation: [id number]        Scheduled Meds:  apixaban   2.5 mg Oral BID   insulin  aspart  0-9 Units Subcutaneous TID WC    irbesartan   150 mg Oral Daily   levothyroxine   88 mcg Oral Daily   metoprolol  succinate  50 mg Oral Daily   polyethylene glycol  17 g Oral Daily   Continuous Infusions:  cefTRIAXone  (ROCEPHIN )  IV 200 mL/hr at 03/07/24 0356     LOS: 0 days    Time spent: 35 mins    Vash Quezada Maree, MD [facility] Pager [phone]  If 7PM-7AM, please contact night-coverage [email or web address]  03/07/2024, 1:37 PM

## 2024-03-07 NOTE — Plan of Care (Signed)
  Problem: Education: Goal: Knowledge of General Education information will improve Description: Including pain rating scale, medication(s)/side effects and non-pharmacologic comfort measures Outcome: Progressing   Problem: Health Behavior/Discharge Planning: Goal: Ability to manage health-related needs will improve Outcome: Progressing   Problem: Clinical Measurements: Goal: Ability to maintain clinical measurements within normal limits will improve Outcome: Progressing   Problem: Activity: Goal: Risk for activity intolerance will decrease Outcome: Progressing   Problem: Coping: Goal: Level of anxiety will decrease Outcome: Progressing   Problem: Elimination: Goal: Will not experience complications related to bowel motility Outcome: Progressing   Problem: Pain Managment: Goal: General experience of comfort will improve and/or be controlled Outcome: Progressing   Problem: Safety: Goal: Ability to remain free from injury will improve Outcome: Progressing   Problem: Skin Integrity: Goal: Risk for impaired skin integrity will decrease Outcome: Progressing   Problem: Coping: Goal: Ability to adjust to condition or change in health will improve Outcome: Progressing   Problem: Nutrition: Goal: Adequate nutrition will be maintained Outcome: Not Progressing   Problem: Fluid Volume: Goal: Ability to maintain a balanced intake and output will improve Outcome: Not Progressing

## 2024-03-08 ENCOUNTER — Other Ambulatory Visit: Payer: Self-pay | Admitting: Internal Medicine

## 2024-03-08 DIAGNOSIS — R531 Weakness: Secondary | ICD-10-CM | POA: Diagnosis not present

## 2024-03-08 DIAGNOSIS — R4182 Altered mental status, unspecified: Secondary | ICD-10-CM | POA: Diagnosis not present

## 2024-03-08 DIAGNOSIS — Z515 Encounter for palliative care: Secondary | ICD-10-CM

## 2024-03-08 DIAGNOSIS — L8990 Pressure ulcer of unspecified site, unspecified stage: Secondary | ICD-10-CM | POA: Insufficient documentation

## 2024-03-08 LAB — URINE CULTURE: Culture: NO GROWTH

## 2024-03-08 LAB — BASIC METABOLIC PANEL WITH GFR
Anion gap: 9 (ref 5–15)
Anion gap: 9 (ref 5–15)
BUN: 27 mg/dL — ABNORMAL HIGH (ref 8–23)
BUN: 25 mg/dL — ABNORMAL HIGH (ref 8–23)
CO2: 22 mmol/L (ref 22–32)
CO2: 20 mmol/L — ABNORMAL LOW (ref 22–32)
Calcium: 8.5 mg/dL — ABNORMAL LOW (ref 8.9–10.3)
Calcium: 8.6 mg/dL — ABNORMAL LOW (ref 8.9–10.3)
Chloride: 103 mmol/L (ref 98–111)
Chloride: 103 mmol/L (ref 98–111)
Creatinine, Ser: 0.95 mg/dL (ref 0.44–1.00)
Creatinine, Ser: 0.92 mg/dL (ref 0.44–1.00)
GFR, Estimated: 56 mL/min — ABNORMAL LOW (ref >60)
GFR, Estimated: 58 mL/min — ABNORMAL LOW (ref >60)
Glucose, Bld: 191 mg/dL — ABNORMAL HIGH (ref 70–99)
Glucose, Bld: 186 mg/dL — ABNORMAL HIGH (ref 70–99)
Potassium: 5.4 mmol/L — ABNORMAL HIGH (ref 3.5–5.1)
Potassium: 4.6 mmol/L (ref 3.5–5.1)
Sodium: 134 mmol/L — ABNORMAL LOW (ref 135–145)
Sodium: 132 mmol/L — ABNORMAL LOW (ref 135–145)

## 2024-03-08 LAB — GLUCOSE, CAPILLARY
Glucose-Capillary: 166 mg/dL — ABNORMAL HIGH (ref 70–99)
Glucose-Capillary: 309 mg/dL — ABNORMAL HIGH (ref 70–99)

## 2024-03-08 LAB — CBC
HCT: 30.3 % — ABNORMAL LOW (ref 36.0–46.0)
Hemoglobin: 9.7 g/dL — ABNORMAL LOW (ref 12.0–15.0)
MCH: 26 pg (ref 26.0–34.0)
MCHC: 32 g/dL (ref 30.0–36.0)
MCV: 81.2 fL (ref 80.0–100.0)
Platelets: 304 K/uL (ref 150–400)
RBC: 3.73 MIL/uL — ABNORMAL LOW (ref 3.87–5.11)
RDW: 17.2 % — ABNORMAL HIGH (ref 11.5–15.5)
WBC: 8.3 K/uL (ref 4.0–10.5)
nRBC: 0.2 % (ref 0.0–0.2)

## 2024-03-08 LAB — LACTIC ACID, PLASMA
Lactic Acid, Venous: 1.2 mmol/L (ref 0.5–1.9)
Lactic Acid, Venous: 3.7 mmol/L (ref 0.5–1.9)

## 2024-03-08 MED ORDER — LORAZEPAM 0.5 MG PO TABS: 0.5000 mg | ORAL_TABLET | ORAL | 0 refills | Status: DC | PRN

## 2024-03-08 MED ORDER — MORPHINE SULFATE (CONCENTRATE) 20 MG/ML PO SOLN: 5.0000 mg | ORAL | 0 refills | Status: DC | PRN

## 2024-03-08 MED ORDER — FUROSEMIDE 10 MG/ML IJ SOLN
40.0000 mg | INTRAMUSCULAR | Status: AC
  Administered 2024-03-08: 40 mg via INTRAVENOUS
  Filled 2024-03-08: qty 4

## 2024-03-08 MED ORDER — MORPHINE SULFATE (CONCENTRATE) 20 MG/ML PO SOLN: 5.0000 mg | ORAL | 0 refills | Status: AC | PRN

## 2024-03-08 MED ORDER — LORAZEPAM 0.5 MG PO TABS: 0.5000 mg | ORAL_TABLET | ORAL | 0 refills | Status: AC | PRN

## 2024-03-08 NOTE — Discharge Summary (Signed)
 Physician Discharge Summary   Patient: Barbara Butler MRN: [id number] DOB: 16-Nov-1929  Admit date:     03/06/2024  Discharge date: 03/08/24  Discharge Physician: Barbara Butler   PCP: Barbara Manna, MD   Recommendations at discharge:    Hospice  Discharge Diagnoses: Principal Problem:   AMS (altered mental status) Active Problems:   Hospice care   Pressure injury of skin   Generalized weakness  Hospital Course: Assessment and Plan:  89 y.o. female with a PMH significant for atrial fibrillation on Eliquis , hypertension, type 2 diabetes mellitus, CVA, hypothyroidism admitted for AMS   8/31: MRI brain, Hospice screening    AMS- likely progressive dementia - CT head & MRI brain neg   Afib-  Type II DM with hyperglycemia HTN-   Hyporthyroidism-  - normal TSH         Disposition: Hospice care Diet recommendation:  Discharge Diet Orders (From admission, onward)     Start     Ordered   03/08/24 0000  Diet - low sodium heart healthy        03/08/24 1321           Carb modified diet DISCHARGE MEDICATION: Allergies as of 03/08/2024   No Known Allergies      Medication List     STOP taking these medications    Eliquis  2.5 MG Tabs tablet Generic drug: apixaban    furosemide  20 MG tablet Commonly known as: LASIX        TAKE these medications    CENTRUM SILVER 50+WOMEN PO Take 1 tablet by mouth daily.   insulin  glargine 100 UNIT/ML injection Commonly known as: LANTUS  Inject 10 Units into the skin daily.   levothyroxine  88 MCG tablet Commonly known as: SYNTHROID  Take 88 mcg by mouth daily.   metFORMIN  500 MG tablet Commonly known as: GLUCOPHAGE  Take 1 tablet (500 mg total) by mouth 2 (two) times daily with a meal. What changed: when to take this   metoprolol  succinate 50 MG 24 hr tablet Commonly known as: TOPROL -XL Take 1 tablet (50 mg total) by mouth daily. Take with or immediately following a meal.   olmesartan 40 MG tablet Commonly  known as: BENICAR Take 40 mg by mouth daily.   polyethylene glycol 17 g packet Commonly known as: MIRALAX  / GLYCOLAX  Take 17 g by mouth daily as needed for mild constipation.               Discharge Care Instructions  (From admission, onward)           Start     Ordered   03/08/24 0000  Discharge wound care:       Comments: As above   03/08/24 1321            Follow-up Information     Barbara Manna, MD Follow up.   Specialty: Internal Medicine Why: hospital follow up Contact information: [address] [place] [address] [phone]                Discharge Exam: Barbara Butler   03/06/24 1845 03/08/24 0403  Weight: 51.7 kg 74.8 kg   General exam: Appears calm and comfortable  Respiratory system: Clear to auscultation. Respiratory effort normal. Cardiovascular system: S1 & S2 heard, RRR. No JVD, murmurs, rubs, gallops or clicks. No pedal edema. Gastrointestinal system: Abdomen is soft, benign Central nervous system: Alert and oriented. No focal neurological deficits. Extremities: Symmetric 5 x 5 power. Skin: No rashes, lesions or  ulcers Psychiatry: Judgement and insight appear normal. Mood & affect appropriate.   Condition at discharge: poor  The results of significant diagnostics from this hospitalization (including imaging, microbiology, ancillary and laboratory) are listed below for reference.   Imaging Studies: MR BRAIN WO CONTRAST Result Date: 03/07/2024 CLINICAL DATA:  Mental status change, unknown cause EXAM: MRI HEAD WITHOUT CONTRAST TECHNIQUE: Multiplanar, multiecho pulse sequences of the brain and surrounding structures were obtained without intravenous contrast. COMPARISON:  CT head from 03/06/2024.  MRI head 01/26/2023. FINDINGS: Brain: No acute infarction, hemorrhage, hydrocephalus, extra-axial collection or mass lesion. Advanced patchy and confluent T2/FLAIR hyperintensities in the white matter,  compatible with chronic microvascular ischemic disease. Small remote lacunar infarcts in the cerebellum and left pons. Cerebral atrophy. Vascular: Major arterial flow voids are maintained at the skull base. Skull and upper cervical spine: Normal marrow signal. Sinuses/Orbits: Clear sinuses.  No acute orbital findings. Other: No mastoid effusions. IMPRESSION: 1. No evidence of acute intracranial abnormality. 2. Advanced chronic microvascular ischemic disease and remote lacunar infarcts. 3.  Cerebral Atrophy (ICD10-G31.9). Electronically Signed   By: Gilmore GORMAN Molt M.D.   On: 03/07/2024 14:07   DG Chest Port 1 View Result Date: 03/07/2024 CLINICAL DATA:  Altered mental status EXAM: PORTABLE CHEST 1 VIEW COMPARISON:  02/05/2023 FINDINGS: Interval development diffuse interstitial pulmonary infiltrate which may reflect changes of airway inflammation or edema superimposed upon underlying interstitial lung disease. No focal consolidation. No pneumothorax or pleural effusion. Cardiac size is mildly enlarged. No acute bone abnormality. IMPRESSION: 1. Interval development of diffuse interstitial pulmonary infiltrate which may reflect changes of airway inflammation or edema superimposed upon underlying interstitial lung disease. In a Electronically Signed   By: Dorethia Molt M.D.   On: 03/07/2024 02:36   CT Head Wo Contrast Result Date: 03/06/2024 EXAM: CT HEAD WITHOUT CONTRAST 03/06/2024 07:41:00 PM TECHNIQUE: CT of the head was performed without the administration of intravenous contrast. Automated exposure control, iterative reconstruction, and/or weight based adjustment of the mA/kV was utilized to reduce the radiation dose to as low as reasonably achievable. COMPARISON: 06/28/2023 CLINICAL HISTORY: Mental status change, unknown cause. FINDINGS: BRAIN AND VENTRICLES: No acute hemorrhage. No evidence of acute infarct. No hydrocephalus. No extra-axial collection. No mass effect or midline shift. Stable atrophy and  chronic small vessel ischemia. ORBITS: No acute abnormality. SINUSES: No acute abnormality. SOFT TISSUES AND SKULL: No acute soft tissue abnormality. No skull fracture. IMPRESSION: 1. No acute intracranial abnormality. 2. Stable atrophy and chronic small vessel ischemia. Electronically signed by: Andrea Gasman MD 03/06/2024 07:55 PM EDT RP Workstation: [id number]    Microbiology: Results for orders placed or performed during the hospital encounter of 03/06/24  Culture, blood (Routine X 2) w Reflex to ID Panel     Status: None (Preliminary result)   Collection Time: 03/07/24  5:24 AM   Specimen: Left Antecubital; Blood  Result Value Ref Range Status   Specimen Description LEFT ANTECUBITAL  Final   Special Requests   Final    BOTTLES DRAWN AEROBIC AND ANAEROBIC Blood Culture adequate volume   Culture   Final    NO GROWTH < 24 HOURS Performed at [facility], [address] [place], [place] [address]    Report Status PENDING  Incomplete  Culture, blood (Routine X 2) w Reflex to ID Panel     Status: None (Preliminary result)   Collection Time: 03/07/24  5:37 AM   Specimen: BLOOD LEFT HAND  Result Value Ref Range Status   Specimen Description  BLOOD LEFT HAND  Final   Special Requests   Final    BOTTLES DRAWN AEROBIC ONLY Blood Culture results may not be optimal due to an inadequate volume of blood received in culture bottles   Culture   Final    NO GROWTH < 24 HOURS Performed at [facility], [address] [place], [place] [address]    Report Status PENDING  Incomplete  Urine Culture (for pregnant, neutropenic or urologic patients or patients with an indwelling urinary catheter)     Status: None   Collection Time: 03/07/24  3:20 PM   Specimen: In/Out Cath Urine  Result Value Ref Range Status   Specimen Description   Final    IN/OUT CATH URINE Performed at [facility], [address] [place], [place] [address]    Special Requests   Final     NONE Performed at [facility], [address], [place], [place] [address]    Culture   Final    NO GROWTH Performed at [facility], [address] [place], [place] [address]    Report Status 03/08/2024 FINAL  Final    Labs: CBC: Recent Labs  Lab 03/06/24 1930 03/07/24 0053 03/08/24 0508  WBC 11.4* 12.6* 8.3  NEUTROABS 10.1*  --   --   HGB 11.4* 12.7 9.7*  HCT 35.0* 40.3 30.3*  MCV 80.6 83.3 81.2  PLT 364 335 304   Basic Metabolic Panel: Recent Labs  Lab 03/06/24 1930 03/07/24 0053 03/08/24 0508 03/08/24 0907  NA 133*  --  134* 132*  K 3.8  --  5.4* 4.6  CL 100  --  103 103  CO2 21*  --  22 20*  GLUCOSE 248*  --  191* 186*  BUN 19  --  27* 25*  CREATININE 0.72 0.70 0.95 0.92  CALCIUM  9.0  --  8.5* 8.6*   Liver Function Tests: Recent Labs  Lab 03/06/24 1930  AST 214*  ALT 67*  ALKPHOS 179*  BILITOT 3.7*  PROT 7.5  ALBUMIN 3.4*   CBG: Recent Labs  Lab 03/07/24 1325 03/07/24 1743 03/07/24 2050 03/08/24 0823 03/08/24 1235  GLUCAP 208* 175* 227* 166* 309*    Discharge time spent: greater than 30 minutes.  Signed: Cresencio Fairly, MD [facility] 03/08/2024

## 2024-03-08 NOTE — Plan of Care (Signed)

## 2024-03-08 NOTE — TOC Transition Note (Signed)
 Transition of Care [facility]) - Discharge Note   Patient Details  Name: Barbara Butler MRN: [id number] Date of Birth: Jan 19, 1930  [facility] of [facility]) CM/SW Contact:  Marinda Cooks, RN Phone Number: 03/08/2024, 1:53 PM   Clinical Narrative:     This CM updated by covering MD pt medically cleared to dc today and has active DC order . This CM spoke with   [facility] admission liaison  and confirmed referral and everything was coordinated for pt to safely dc home today.  DC transportation confirmed for pt with family Medical team updated . No additional DC needs requested by medical team or identified by CM at this time .    Final next level of care: Skilled Nursing Facility Barriers to Discharge: No Barriers Identified   Patient Goals and CMS Choice   CMS Medicare.gov Compare Post Acute Care list provided to:: Patient Represenative (must comment)    Name of family member notified: Pecolia B(Hcpoa) Peeters Daughter in [place] [phone] Patient and family notified of of transfer: 03/08/24  Discharge Plan and Services Additional resources added to the After Visit Summary for                              [facility] Agency: Other - See comment [facility])        Social Drivers of Health (SDOH) Interventions SDOH Screenings   Food Insecurity: Patient Unable To Answer (03/07/2024)  Housing: Unknown (03/07/2024)  Transportation Needs: Patient Unable To Answer (03/07/2024)  Utilities: Patient Unable To Answer (03/07/2024)  Financial Resource Strain: Low Risk  (02/23/2024)   Received from [facility]  Social Connections: Patient Unable To Answer (03/07/2024)  Recent Concern: Social Connections - Moderately Isolated (12/14/2023)  Tobacco Use: Medium Risk (03/06/2024)     Readmission Risk Interventions    02/07/2023    3:03 PM  Readmission Risk Prevention Plan  Transportation Screening Complete  PCP or Specialist Appt within 5-7 Days Complete   Home Care Screening Complete  Medication Review (RN CM) Complete

## 2024-03-08 NOTE — Progress Notes (Unsigned)
 {  Select_TRH_Note:[id number]}

## 2024-03-10 NOTE — Telephone Encounter (Signed)
 Patient has not had an Afib/aflutter ablation within the last 3 months or DCCV within the last 30 days   Per office protocol, patient can hold Eliquis  for 2 days prior to procedure.     I will route this recommendation to the requesting party via Epic fax function and remove from pre-op pool.   Please call with questions.   Lamarr Satterfield, NP 02/27/2024, 4:36 PM   Furth, Cadence H, PA-C to [facility]     02/25/24 10:03 AM Patient is OK from a cardiac perspective

## 2024-03-12 ENCOUNTER — Telehealth: Payer: Self-pay

## 2024-03-12 LAB — CULTURE, BLOOD (ROUTINE X 2)
Culture: NO GROWTH
Culture: NO GROWTH
Special Requests: ADEQUATE

## 2024-03-12 NOTE — Telephone Encounter (Signed)
 Patient has not had an Afib/aflutter ablation within the last 3 months or DCCV within the last 30 days   Per office protocol, patient can hold Eliquis  for 2 days prior to procedure.     **This guidance is not considered finalized until pre-operative APP has relayed final recommendations.**         02/27/24  2:26 PM Jerilynn Lamarr HERO, NP routed this conversation to [facility] Pharm Jerilynn Lamarr HERO, NP    02/27/24  2:26 PM Note Pharmacy please advise on holding Apixaban  prior to ER scheduled for 03/22/2024. Last labs  (CBC and CMET 12/29/2023) . Thank you.       Furth, Cadence H, PA-C to [employer] [facility]     02/25/24 10:03 AM Patient is OK from a cardiac perspective

## 2024-03-22 ENCOUNTER — Encounter: Admission: RE | Payer: Self-pay | Source: Home / Self Care

## 2024-03-22 ENCOUNTER — Ambulatory Visit: Admission: RE | Admit: 2024-03-22 | Source: Home / Self Care | Admitting: Gastroenterology

## 2024-03-22 SURGERY — ERCP, WITH INTERVENTION IF INDICATED
Anesthesia: General

## 2024-04-07 DEATH — deceased

## 2024-06-08 ENCOUNTER — Ambulatory Visit: Admitting: Medical
